# Patient Record
Sex: Female | Born: 1935 | Race: White | Hispanic: No | Marital: Married | Smoking: Never smoker
Health system: Southern US, Community
[De-identification: ages and names within clinical notes are randomized; demographics above are authoritative.]

## PROBLEM LIST (undated history)

## (undated) DIAGNOSIS — K219 Gastro-esophageal reflux disease without esophagitis: Secondary | ICD-10-CM

## (undated) DIAGNOSIS — M8580 Other specified disorders of bone density and structure, unspecified site: Secondary | ICD-10-CM

## (undated) DIAGNOSIS — C801 Malignant (primary) neoplasm, unspecified: Secondary | ICD-10-CM

## (undated) DIAGNOSIS — E785 Hyperlipidemia, unspecified: Secondary | ICD-10-CM

## (undated) DIAGNOSIS — R42 Dizziness and giddiness: Secondary | ICD-10-CM

## (undated) DIAGNOSIS — I10 Essential (primary) hypertension: Secondary | ICD-10-CM

## (undated) DIAGNOSIS — R0600 Dyspnea, unspecified: Secondary | ICD-10-CM

## (undated) DIAGNOSIS — E039 Hypothyroidism, unspecified: Secondary | ICD-10-CM

## (undated) DIAGNOSIS — M1990 Unspecified osteoarthritis, unspecified site: Secondary | ICD-10-CM

## (undated) DIAGNOSIS — E079 Disorder of thyroid, unspecified: Secondary | ICD-10-CM

## (undated) DIAGNOSIS — M4800 Spinal stenosis, site unspecified: Secondary | ICD-10-CM

## (undated) DIAGNOSIS — F419 Anxiety disorder, unspecified: Secondary | ICD-10-CM

## (undated) HISTORY — DX: Other specified disorders of bone density and structure, unspecified site: M85.80

## (undated) HISTORY — DX: Hyperlipidemia, unspecified: E78.5

## (undated) HISTORY — DX: Unspecified osteoarthritis, unspecified site: M19.90

## (undated) HISTORY — PX: EYE SURGERY: SHX253

## (undated) HISTORY — DX: Essential (primary) hypertension: I10

## (undated) HISTORY — DX: Spinal stenosis, site unspecified: M48.00

## (undated) HISTORY — PX: MASTECTOMY: SHX3

## (undated) HISTORY — DX: Anxiety disorder, unspecified: F41.9

## (undated) HISTORY — DX: Disorder of thyroid, unspecified: E07.9

---

## 1978-03-10 HISTORY — PX: BREAST SURGERY: SHX581

## 1987-07-10 HISTORY — PX: CHOLECYSTECTOMY: SHX55

## 1988-03-10 HISTORY — PX: ABDOMINAL HYSTERECTOMY: SHX81

## 1997-09-25 ENCOUNTER — Other Ambulatory Visit: Admission: RE | Admit: 1997-09-25 | Discharge: 1997-09-25 | Payer: Self-pay | Admitting: Gynecology

## 1998-02-06 LAB — HM COLONOSCOPY: HM Colonoscopy: NEGATIVE

## 1998-12-13 ENCOUNTER — Other Ambulatory Visit: Admission: RE | Admit: 1998-12-13 | Discharge: 1998-12-13 | Payer: Self-pay | Admitting: Gynecology

## 1999-12-18 ENCOUNTER — Other Ambulatory Visit: Admission: RE | Admit: 1999-12-18 | Discharge: 1999-12-18 | Payer: Self-pay | Admitting: Gynecology

## 2001-01-01 ENCOUNTER — Other Ambulatory Visit: Admission: RE | Admit: 2001-01-01 | Discharge: 2001-01-01 | Payer: Self-pay | Admitting: Gynecology

## 2002-02-07 DIAGNOSIS — M8580 Other specified disorders of bone density and structure, unspecified site: Secondary | ICD-10-CM

## 2002-02-07 HISTORY — DX: Other specified disorders of bone density and structure, unspecified site: M85.80

## 2002-02-09 ENCOUNTER — Other Ambulatory Visit: Admission: RE | Admit: 2002-02-09 | Discharge: 2002-02-09 | Payer: Self-pay | Admitting: Gynecology

## 2002-12-09 DIAGNOSIS — I10 Essential (primary) hypertension: Secondary | ICD-10-CM

## 2002-12-09 DIAGNOSIS — F419 Anxiety disorder, unspecified: Secondary | ICD-10-CM

## 2002-12-09 HISTORY — DX: Essential (primary) hypertension: I10

## 2002-12-09 HISTORY — DX: Anxiety disorder, unspecified: F41.9

## 2003-02-10 ENCOUNTER — Other Ambulatory Visit: Admission: RE | Admit: 2003-02-10 | Discharge: 2003-02-10 | Payer: Self-pay | Admitting: Gynecology

## 2003-11-23 LAB — FECAL OCCULT BLOOD, GUAIAC: Fecal Occult Blood: NEGATIVE

## 2004-02-26 ENCOUNTER — Other Ambulatory Visit: Admission: RE | Admit: 2004-02-26 | Discharge: 2004-02-26 | Payer: Self-pay | Admitting: Gynecology

## 2004-11-08 DIAGNOSIS — E785 Hyperlipidemia, unspecified: Secondary | ICD-10-CM

## 2004-11-08 HISTORY — DX: Hyperlipidemia, unspecified: E78.5

## 2004-11-19 ENCOUNTER — Ambulatory Visit: Payer: Self-pay | Admitting: Family Medicine

## 2004-11-26 ENCOUNTER — Ambulatory Visit: Payer: Self-pay | Admitting: Family Medicine

## 2004-12-30 ENCOUNTER — Ambulatory Visit: Payer: Self-pay | Admitting: Family Medicine

## 2005-05-13 ENCOUNTER — Other Ambulatory Visit: Admission: RE | Admit: 2005-05-13 | Discharge: 2005-05-13 | Payer: Self-pay | Admitting: Gynecology

## 2005-06-25 ENCOUNTER — Ambulatory Visit: Payer: Self-pay | Admitting: Family Medicine

## 2005-06-30 ENCOUNTER — Ambulatory Visit: Payer: Self-pay | Admitting: Family Medicine

## 2005-12-31 ENCOUNTER — Ambulatory Visit: Payer: Self-pay | Admitting: Family Medicine

## 2006-06-16 ENCOUNTER — Encounter: Payer: Self-pay | Admitting: Family Medicine

## 2006-06-16 DIAGNOSIS — M8580 Other specified disorders of bone density and structure, unspecified site: Secondary | ICD-10-CM | POA: Insufficient documentation

## 2006-06-16 DIAGNOSIS — F411 Generalized anxiety disorder: Secondary | ICD-10-CM | POA: Insufficient documentation

## 2006-06-16 DIAGNOSIS — E0520 Thyrotoxicosis with toxic multinodular goiter without thyrotoxic crisis or storm: Secondary | ICD-10-CM | POA: Insufficient documentation

## 2006-06-16 DIAGNOSIS — I10 Essential (primary) hypertension: Secondary | ICD-10-CM

## 2006-06-16 DIAGNOSIS — E785 Hyperlipidemia, unspecified: Secondary | ICD-10-CM | POA: Insufficient documentation

## 2006-06-22 DIAGNOSIS — M48061 Spinal stenosis, lumbar region without neurogenic claudication: Secondary | ICD-10-CM

## 2006-07-02 ENCOUNTER — Ambulatory Visit: Payer: Self-pay | Admitting: Family Medicine

## 2006-07-02 LAB — CONVERTED CEMR LAB
AST: 23 units/L (ref 0–37)
Alkaline Phosphatase: 80 units/L (ref 39–117)
CO2: 29 meq/L (ref 19–32)
Chloride: 105 meq/L (ref 96–112)
Creatinine,U: 89.2 mg/dL
GFR calc non Af Amer: 57 mL/min
Glucose, Bld: 99 mg/dL (ref 70–99)
HDL: 48.7 mg/dL (ref >39.0)
Hgb A1c MFr Bld: 6 % (ref 4.6–6.0)
LDL Cholesterol: 110 mg/dL — ABNORMAL HIGH (ref 0–99)
Microalb Creat Ratio: 2.2 mg/g (ref 0.0–30.0)
Microalb, Ur: 0.2 mg/dL (ref 0.0–1.9)
Sodium: 140 meq/L (ref 135–145)
TSH: 0.64 microintl units/mL (ref 0.35–5.50)
Total Bilirubin: 0.9 mg/dL (ref 0.3–1.2)
Triglycerides: 91 mg/dL (ref 0–149)
VLDL: 18 mg/dL (ref 0–40)

## 2006-07-06 ENCOUNTER — Ambulatory Visit: Payer: Self-pay | Admitting: Family Medicine

## 2006-10-20 ENCOUNTER — Other Ambulatory Visit: Admission: RE | Admit: 2006-10-20 | Discharge: 2006-10-20 | Payer: Self-pay | Admitting: Gynecology

## 2006-10-20 ENCOUNTER — Encounter: Payer: Self-pay | Admitting: Family Medicine

## 2007-06-28 ENCOUNTER — Ambulatory Visit: Payer: Self-pay | Admitting: Family Medicine

## 2007-06-29 ENCOUNTER — Telehealth: Payer: Self-pay | Admitting: Family Medicine

## 2007-06-29 LAB — CONVERTED CEMR LAB
CO2: 30 meq/L (ref 19–32)
Calcium: 9.5 mg/dL (ref 8.4–10.5)
Chloride: 109 meq/L (ref 96–112)
GFR calc non Af Amer: 64 mL/min
Glucose, Bld: 104 mg/dL — ABNORMAL HIGH (ref 70–99)
HDL: 44.9 mg/dL (ref >39.0)
Hgb A1c MFr Bld: 6.2 % — ABNORMAL HIGH (ref 4.6–6.0)
LDL Cholesterol: 114 mg/dL — ABNORMAL HIGH (ref 0–99)
Microalb Creat Ratio: 3.5 mg/g (ref 0.0–30.0)
Microalb, Ur: 0.3 mg/dL (ref 0.0–1.9)
Total CHOL/HDL Ratio: 4.1
Triglycerides: 133 mg/dL (ref 0–149)
VLDL: 27 mg/dL (ref 0–40)

## 2007-07-01 ENCOUNTER — Ambulatory Visit: Payer: Self-pay | Admitting: Family Medicine

## 2007-07-01 DIAGNOSIS — M79609 Pain in unspecified limb: Secondary | ICD-10-CM

## 2007-10-04 ENCOUNTER — Ambulatory Visit: Payer: Self-pay | Admitting: Family Medicine

## 2007-10-07 LAB — CONVERTED CEMR LAB
HDL: 39.8 mg/dL (ref >39.0)
Total CHOL/HDL Ratio: 4.1

## 2007-10-18 ENCOUNTER — Ambulatory Visit: Payer: Self-pay | Admitting: Family Medicine

## 2007-12-01 ENCOUNTER — Encounter: Payer: Self-pay | Admitting: Family Medicine

## 2007-12-08 ENCOUNTER — Encounter: Payer: Self-pay | Admitting: Family Medicine

## 2007-12-20 ENCOUNTER — Encounter: Payer: Self-pay | Admitting: Family Medicine

## 2007-12-28 ENCOUNTER — Ambulatory Visit: Payer: Self-pay | Admitting: Family Medicine

## 2007-12-29 DIAGNOSIS — C50919 Malignant neoplasm of unspecified site of unspecified female breast: Secondary | ICD-10-CM

## 2007-12-31 ENCOUNTER — Telehealth: Payer: Self-pay | Admitting: Family Medicine

## 2008-01-03 ENCOUNTER — Encounter: Admission: RE | Admit: 2008-01-03 | Discharge: 2008-01-03 | Payer: Self-pay | Admitting: General Surgery

## 2008-01-24 ENCOUNTER — Telehealth (INDEPENDENT_AMBULATORY_CARE_PROVIDER_SITE_OTHER): Payer: Self-pay | Admitting: *Deleted

## 2008-01-25 ENCOUNTER — Encounter: Admission: RE | Admit: 2008-01-25 | Discharge: 2008-01-25 | Payer: Self-pay | Admitting: General Surgery

## 2008-01-27 ENCOUNTER — Ambulatory Visit (HOSPITAL_BASED_OUTPATIENT_CLINIC_OR_DEPARTMENT_OTHER): Admission: RE | Admit: 2008-01-27 | Discharge: 2008-01-28 | Payer: Self-pay | Admitting: General Surgery

## 2008-01-27 ENCOUNTER — Encounter (HOSPITAL_BASED_OUTPATIENT_CLINIC_OR_DEPARTMENT_OTHER): Payer: Self-pay | Admitting: General Surgery

## 2008-02-11 ENCOUNTER — Encounter: Payer: Self-pay | Admitting: Family Medicine

## 2008-02-15 ENCOUNTER — Encounter: Payer: Self-pay | Admitting: Family Medicine

## 2008-02-17 ENCOUNTER — Ambulatory Visit: Payer: Self-pay | Admitting: Oncology

## 2008-03-06 ENCOUNTER — Encounter: Payer: Self-pay | Admitting: Family Medicine

## 2008-03-06 LAB — CMP (CANCER CENTER ONLY)
Albumin: 3.4 g/dL (ref 3.3–5.5)
Alkaline Phosphatase: 63 U/L (ref 26–84)
BUN, Bld: 15 mg/dL (ref 7–22)
Calcium: 9.7 mg/dL (ref 8.0–10.3)
Chloride: 105 mEq/L (ref 98–108)
Glucose, Bld: 112 mg/dL (ref 73–118)
Potassium: 3.9 mEq/L (ref 3.3–4.7)
Sodium: 142 mEq/L (ref 128–145)
Total Protein: 7 g/dL (ref 6.4–8.1)

## 2008-03-06 LAB — CBC WITH DIFFERENTIAL (CANCER CENTER ONLY)
BASO#: 0.1 10*3/uL (ref 0.0–0.2)
BASO%: 0.6 % (ref 0.0–2.0)
EOS%: 5 % (ref 0.0–7.0)
HCT: 35.8 % (ref 34.8–46.6)
HGB: 12 g/dL (ref 11.6–15.9)
LYMPH#: 1.8 10*3/uL (ref 0.9–3.3)
MCHC: 33.6 g/dL (ref 32.0–36.0)
MONO#: 0.7 10*3/uL (ref 0.1–0.9)
NEUT#: 4.7 10*3/uL (ref 1.5–6.5)
WBC: 7.6 10*3/uL (ref 3.9–10.0)

## 2008-03-15 ENCOUNTER — Encounter: Payer: Self-pay | Admitting: Family Medicine

## 2008-03-22 ENCOUNTER — Telehealth: Payer: Self-pay | Admitting: Family Medicine

## 2008-03-29 ENCOUNTER — Telehealth (INDEPENDENT_AMBULATORY_CARE_PROVIDER_SITE_OTHER): Payer: Self-pay | Admitting: *Deleted

## 2008-04-03 ENCOUNTER — Telehealth (INDEPENDENT_AMBULATORY_CARE_PROVIDER_SITE_OTHER): Payer: Self-pay | Admitting: *Deleted

## 2008-04-18 ENCOUNTER — Ambulatory Visit: Payer: Self-pay | Admitting: Oncology

## 2008-04-19 ENCOUNTER — Encounter: Payer: Self-pay | Admitting: Family Medicine

## 2008-05-16 ENCOUNTER — Encounter: Payer: Self-pay | Admitting: Family Medicine

## 2008-07-17 ENCOUNTER — Ambulatory Visit: Payer: Self-pay | Admitting: Oncology

## 2008-07-18 ENCOUNTER — Encounter: Payer: Self-pay | Admitting: Family Medicine

## 2008-07-18 LAB — CBC WITH DIFFERENTIAL (CANCER CENTER ONLY)
BASO#: 0.1 10*3/uL (ref 0.0–0.2)
BASO%: 0.9 % (ref 0.0–2.0)
Eosinophils Absolute: 0.3 10*3/uL (ref 0.0–0.5)
HCT: 38 % (ref 34.8–46.6)
HGB: 13 g/dL (ref 11.6–15.9)
LYMPH#: 1.9 10*3/uL (ref 0.9–3.3)
MONO#: 0.5 10*3/uL (ref 0.1–0.9)
NEUT%: 67.6 % (ref 39.6–80.0)
RBC: 4.26 10*6/uL (ref 3.70–5.32)
WBC: 8.6 10*3/uL (ref 3.9–10.0)

## 2008-07-18 LAB — CMP (CANCER CENTER ONLY)
AST: 25 U/L (ref 11–38)
Albumin: 3.9 g/dL (ref 3.3–5.5)
Alkaline Phosphatase: 77 U/L (ref 26–84)
BUN, Bld: 18 mg/dL (ref 7–22)
Creat: 1 mg/dl (ref 0.6–1.2)
Glucose, Bld: 148 mg/dL — ABNORMAL HIGH (ref 73–118)
Potassium: 3.9 mEq/L (ref 3.3–4.7)
Total Bilirubin: 0.6 mg/dl (ref 0.20–1.60)

## 2008-08-23 ENCOUNTER — Encounter: Payer: Self-pay | Admitting: Family Medicine

## 2008-08-23 LAB — URINALYSIS, MICROSCOPIC (CHCC SATELLITE)
Bacteria, UA: NEGATIVE
Bilirubin (Urine): NEGATIVE
Glucose: NEGATIVE g/dL
Leukocyte Esterase: NEGATIVE
Nitrite: NEGATIVE
WBC: NEGATIVE (ref 0–2)

## 2008-11-08 LAB — HM DIABETES EYE EXAM: HM Diabetic Eye Exam: NORMAL

## 2008-11-20 ENCOUNTER — Ambulatory Visit: Payer: Self-pay | Admitting: Oncology

## 2008-11-23 LAB — CBC WITH DIFFERENTIAL (CANCER CENTER ONLY)
BASO%: 0.9 % (ref 0.0–2.0)
LYMPH#: 2 10*3/uL (ref 0.9–3.3)
MONO#: 0.7 10*3/uL (ref 0.1–0.9)
Platelets: 324 10*3/uL (ref 145–400)
RBC: 4.06 10*6/uL (ref 3.70–5.32)
RDW: 12.5 % (ref 10.5–14.6)
WBC: 9.1 10*3/uL (ref 3.9–10.0)

## 2008-11-23 LAB — CMP (CANCER CENTER ONLY)
ALT(SGPT): 19 U/L (ref 10–47)
CO2: 31 mEq/L (ref 18–33)
Calcium: 9.7 mg/dL (ref 8.0–10.3)
Chloride: 100 mEq/L (ref 98–108)
Glucose, Bld: 107 mg/dL (ref 73–118)
Sodium: 141 mEq/L (ref 128–145)
Total Bilirubin: 0.7 mg/dl (ref 0.20–1.60)
Total Protein: 7.4 g/dL (ref 6.4–8.1)

## 2008-11-29 ENCOUNTER — Telehealth: Payer: Self-pay | Admitting: Family Medicine

## 2009-01-17 ENCOUNTER — Ambulatory Visit: Payer: Self-pay | Admitting: Family Medicine

## 2009-01-18 LAB — CONVERTED CEMR LAB
ALT: 18 units/L (ref 0–35)
Albumin: 3.7 g/dL (ref 3.5–5.2)
Basophils Absolute: 0.1 10*3/uL (ref 0.0–0.1)
CO2: 29 meq/L (ref 19–32)
Calcium: 9.6 mg/dL (ref 8.4–10.5)
Chloride: 102 meq/L (ref 96–112)
Creatinine, Ser: 1 mg/dL (ref 0.4–1.2)
Eosinophils Absolute: 0.3 10*3/uL (ref 0.0–0.7)
GFR calc non Af Amer: 56.68 mL/min (ref >60)
Glucose, Bld: 103 mg/dL — ABNORMAL HIGH (ref 70–99)
HCT: 35.7 % — ABNORMAL LOW (ref 36.0–46.0)
Hemoglobin: 12.5 g/dL (ref 12.0–15.0)
Hgb A1c MFr Bld: 6.1 % (ref 4.6–6.5)
Lymphocytes Relative: 22.5 % (ref 12.0–46.0)
MCHC: 35.1 g/dL (ref 30.0–36.0)
MCV: 94.1 fL (ref 78.0–100.0)
Neutro Abs: 5.2 10*3/uL (ref 1.4–7.7)
Phosphorus: 4.4 mg/dL (ref 2.3–4.6)
Triglycerides: 145 mg/dL (ref 0.0–149.0)
VLDL: 29 mg/dL (ref 0.0–40.0)

## 2009-02-14 ENCOUNTER — Ambulatory Visit: Payer: Self-pay | Admitting: Oncology

## 2009-02-19 ENCOUNTER — Encounter: Payer: Self-pay | Admitting: Family Medicine

## 2009-02-19 LAB — CMP (CANCER CENTER ONLY)
AST: 23 U/L (ref 11–38)
Albumin: 3.3 g/dL (ref 3.3–5.5)
Alkaline Phosphatase: 61 U/L (ref 26–84)
BUN, Bld: 17 mg/dL (ref 7–22)
Calcium: 8.8 mg/dL (ref 8.0–10.3)
Chloride: 101 mEq/L (ref 98–108)
Potassium: 3.9 mEq/L (ref 3.3–4.7)
Sodium: 138 mEq/L (ref 128–145)
Total Protein: 6.4 g/dL (ref 6.4–8.1)

## 2009-02-19 LAB — CBC WITH DIFFERENTIAL (CANCER CENTER ONLY)
BASO%: 0.5 % (ref 0.0–2.0)
LYMPH%: 20.3 % (ref 14.0–48.0)
MCH: 31 pg (ref 26.0–34.0)
MCV: 94 fL (ref 81–101)
MONO#: 0.4 10*3/uL (ref 0.1–0.9)
MONO%: 5.5 % (ref 0.0–13.0)
NEUT#: 5.5 10*3/uL (ref 1.5–6.5)
Platelets: 264 10*3/uL (ref 145–400)
RDW: 12.1 % (ref 10.5–14.6)
WBC: 7.9 10*3/uL (ref 3.9–10.0)

## 2009-02-20 LAB — VITAMIN D 25 HYDROXY (VIT D DEFICIENCY, FRACTURES): Vit D, 25-Hydroxy: 23 ng/mL — ABNORMAL LOW (ref 30–89)

## 2009-03-23 ENCOUNTER — Telehealth: Payer: Self-pay | Admitting: Family Medicine

## 2009-04-02 ENCOUNTER — Telehealth: Payer: Self-pay | Admitting: Family Medicine

## 2009-05-16 ENCOUNTER — Telehealth: Payer: Self-pay | Admitting: Family Medicine

## 2009-05-18 ENCOUNTER — Ambulatory Visit: Payer: Self-pay | Admitting: Family Medicine

## 2009-06-08 LAB — HM MAMMOGRAPHY: HM Mammogram: NEGATIVE

## 2009-07-17 ENCOUNTER — Ambulatory Visit: Payer: Self-pay | Admitting: Family Medicine

## 2009-07-17 LAB — CONVERTED CEMR LAB
Albumin: 3.9 g/dL (ref 3.5–5.2)
BUN: 21 mg/dL (ref 6–23)
CO2: 33 meq/L — ABNORMAL HIGH (ref 19–32)
Creatinine, Ser: 1 mg/dL (ref 0.4–1.2)
Creatinine,U: 141.2 mg/dL
GFR calc non Af Amer: 60.07 mL/min (ref >60)
Glucose, Bld: 102 mg/dL — ABNORMAL HIGH (ref 70–99)
HDL: 59.4 mg/dL (ref >39.00)
Hgb A1c MFr Bld: 5.9 % (ref 4.6–6.5)
LDL Cholesterol: 112 mg/dL — ABNORMAL HIGH (ref 0–99)
Microalb Creat Ratio: 1.4 mg/g (ref 0.0–30.0)
Sodium: 146 meq/L — ABNORMAL HIGH (ref 135–145)
TSH: 1.16 microintl units/mL (ref 0.35–5.50)
Triglycerides: 112 mg/dL (ref 0.0–149.0)

## 2009-07-19 ENCOUNTER — Ambulatory Visit: Payer: Self-pay | Admitting: Family Medicine

## 2009-07-22 DIAGNOSIS — R7309 Other abnormal glucose: Secondary | ICD-10-CM

## 2009-08-01 ENCOUNTER — Inpatient Hospital Stay: Payer: Self-pay | Admitting: Internal Medicine

## 2009-08-01 ENCOUNTER — Ambulatory Visit: Payer: Self-pay | Admitting: Cardiovascular Disease

## 2009-08-02 ENCOUNTER — Encounter: Payer: Self-pay | Admitting: Cardiovascular Disease

## 2009-08-02 ENCOUNTER — Encounter: Payer: Self-pay | Admitting: Family Medicine

## 2009-08-08 ENCOUNTER — Ambulatory Visit: Payer: Self-pay | Admitting: Family Medicine

## 2009-08-13 ENCOUNTER — Ambulatory Visit: Payer: Self-pay | Admitting: Oncology

## 2009-08-15 ENCOUNTER — Encounter: Payer: Self-pay | Admitting: Family Medicine

## 2009-08-15 LAB — CMP (CANCER CENTER ONLY)
Albumin: 4.2 g/dL (ref 3.3–5.5)
Alkaline Phosphatase: 71 U/L (ref 26–84)
CO2: 29 mEq/L (ref 18–33)
Calcium: 10.2 mg/dL (ref 8.0–10.3)
Chloride: 100 mEq/L (ref 98–108)
Creat: 0.8 mg/dl (ref 0.6–1.2)
Potassium: 4 mEq/L (ref 3.3–4.7)
Sodium: 142 mEq/L (ref 128–145)
Total Protein: 7.5 g/dL (ref 6.4–8.1)

## 2009-08-15 LAB — CBC WITH DIFFERENTIAL (CANCER CENTER ONLY)
BASO#: 0.1 10*3/uL (ref 0.0–0.2)
HGB: 10.1 g/dL — ABNORMAL LOW (ref 11.6–15.9)
LYMPH#: 1.7 10*3/uL (ref 0.9–3.3)
MCH: 30.6 pg (ref 26.0–34.0)
NEUT#: 6.8 10*3/uL — ABNORMAL HIGH (ref 1.5–6.5)
RBC: 3.29 10*6/uL — ABNORMAL LOW (ref 3.70–5.32)
WBC: 9.6 10*3/uL (ref 3.9–10.0)

## 2009-08-15 LAB — FERRITIN: Ferritin: 135 ng/mL (ref 10–291)

## 2009-08-15 LAB — CANCER ANTIGEN 27.29: CA 27.29: 25 U/mL (ref 0–39)

## 2009-08-15 LAB — IRON AND TIBC: UIBC: 214 ug/dL

## 2009-10-09 ENCOUNTER — Ambulatory Visit: Payer: Self-pay | Admitting: Family Medicine

## 2009-10-09 DIAGNOSIS — R4589 Other symptoms and signs involving emotional state: Secondary | ICD-10-CM

## 2009-10-23 ENCOUNTER — Ambulatory Visit: Payer: Self-pay | Admitting: Family Medicine

## 2009-11-08 ENCOUNTER — Ambulatory Visit: Payer: Self-pay | Admitting: Oncology

## 2009-11-15 ENCOUNTER — Encounter: Payer: Self-pay | Admitting: Family Medicine

## 2009-11-15 LAB — CBC WITH DIFFERENTIAL (CANCER CENTER ONLY)
BASO#: 0.1 10*3/uL (ref 0.0–0.2)
BASO%: 0.7 % (ref 0.0–2.0)
EOS%: 2.4 % (ref 0.0–7.0)
HGB: 12.2 g/dL (ref 11.6–15.9)
LYMPH#: 1.7 10*3/uL (ref 0.9–3.3)
LYMPH%: 16.8 % (ref 14.0–48.0)
MCH: 31.4 pg (ref 26.0–34.0)
MCV: 91 fL (ref 81–101)
MONO%: 8.5 % (ref 0.0–13.0)
NEUT#: 7.1 10*3/uL — ABNORMAL HIGH (ref 1.5–6.5)
RBC: 3.88 10*6/uL (ref 3.70–5.32)
WBC: 9.8 10*3/uL (ref 3.9–10.0)

## 2009-11-15 LAB — CMP (CANCER CENTER ONLY)
ALT(SGPT): 22 U/L (ref 10–47)
AST: 24 U/L (ref 11–38)
Alkaline Phosphatase: 84 U/L (ref 26–84)
CO2: 28 mEq/L (ref 18–33)
Total Protein: 6.9 g/dL (ref 6.4–8.1)

## 2010-01-11 ENCOUNTER — Ambulatory Visit: Payer: Self-pay | Admitting: Internal Medicine

## 2010-02-07 ENCOUNTER — Ambulatory Visit: Payer: Self-pay | Admitting: Internal Medicine

## 2010-03-31 ENCOUNTER — Encounter: Payer: Self-pay | Admitting: Oncology

## 2010-04-09 NOTE — Letter (Signed)
[facility]   Imported By: Lester Cave 08/27/2009 12:28:00  _____________________________________________________________________  External Attachment:    Type:   Image     Comment:   External Document

## 2010-04-09 NOTE — Assessment & Plan Note (Signed)
Summary: roa 6 mths- f/u on labs cyd   Vital Signs:  Patient profile:   77 year old female Height:      61.4 inches Weight:      151.50 pounds BMI:     28.36 Temp:     97.8 degrees F oral Pulse rate:   64 / minute Pulse rhythm:   regular BP sitting:   168 / 76  (left arm) Cuff size:   regular  Vitals Entered By: Lewanda Rife LPN (2009/08/12 9:07 AM) CC: six month f/u after labs   History of Present Illness: here for f/u of lipid/DM and HTN  is feeling fair   lost a son april 11 th - is down  is not getting any counseling -- "cannot go for that "  husb is getting alz  she is not feeling suicidal  no support or people to talk to   gets up at night to urinate frequently   wt is stable   bp is 168/76 today- high for her   sodium 146 water intake  AIC great at 5.9  opthy up to date  microalb is up  cough with ace in past   lipids great with LDL down to 112 and HDL 59 -- this is imp with diet  is doing well with low sat fat diet and low sugar as well      Allergies: 1)  ! Zestril (Lisinopril) 2)  ! Ery-Tab (Erythromycin Base)  Past History:  Past Medical History: Last updated: 07/01/2007 Anxiety: 12/2002 Diabetes mellitus, type II: 11/2004 Hyperlipidemia: 11/2004 Hypertension: 12/2002 spinal stenosis  hypothyroid after radio ablation of thyroid   endo---Dr [facility]  Past Surgical History: Last updated: 07/06/2006  01/1998: COLONOSCOPY (HEMM'S) NEG. POLYOS ([place]) 02/2002: DEXA IMPROVED  OSTEOPENIA 02/26/2004: DEXA (DR.LOMAX) STABLE/ IMR FEMUR/SPINE 07/10/1987 CHOLEYCYSTECTOMY 1990 HYSTERECTOMY  ENDOMETRIAL ADENOCA 1980 L BREAST LUMPECTOMY 1980'S ABSCESS OF BREAST EVAC TEAR DUCT SURGERY  Family History: Last updated: 2009-08-12 Father: DECEASED 45 YOA; CA COLON Mother: DIED 34 YOA?CROHNS ALZHEIMERS /ANGINA/HTN Siblings: BROTHERS 3/1BROTHER ETOH,1 BROTHERSLEEP APNEA SISTERS:2 / 1 dec 68  BREAST CA/ ANGINA/EMPHYSEMA (SMOKER) O2 TX. 1 SISTER  HTN BACK SURG. 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 1 SISTER HTN BACK SURG. TOTAL KNEE/ SMOKER CV: =SISTER(ANGINA) MOTHER(ANGINA) HBP+ MOTHER,SISTER DM: = P AUNT COLON CANCER:+ FATHER DEPRESSION: IN FAMILY ETOH/DRUG ABUSE: +BRO OTHER: NEGATIVE STROKE Family History of Colon CA 1st degree relative <60  lost son to suicide in Aug 12, 2009   Review of Systems General:  Complains of fatigue; denies fever, loss of appetite, and malaise. Eyes:  Denies blurring and eye irritation. CV:  Denies chest pain or discomfort, lightheadness, palpitations, shortness of breath with exertion, and swelling of feet. Resp:  Denies cough, shortness of breath, and wheezing. GI:  Denies abdominal  pain, bloody stools, change in bowel habits, indigestion, and nausea. GU:  Denies urinary frequency. MS:  Complains of stiffness; denies muscle aches and cramps. Derm:  Denies itching, lesion(s), poor wound healing, and rash. Neuro:  Denies numbness, tingling, and tremors. Psych:  Complains of depression and easily tearful; denies panic attacks, sense of great danger, and suicidal thoughts/plans. Endo:  Denies cold  intolerance, excessive thirst, excessive urination, and heat intolerance. Heme:  Denies abnormal bruising and bleeding.  Physical Exam  General:  overweight but generally well appearing  Head:  normocephalic, atraumatic, and no abnormalities observed.  no facial or head trauma  Eyes:  vision grossly intact, pupils equal, pupils round, and pupils reactive to light.   Neck:  supple with full rom and no masses or thyromegally, no JVD or carotid bruit  Lungs:  Normal respiratory effort, chest expands symmetrically. Lungs are clear to auscultation, no crackles or wheezes. Heart:  Normal rate and regular rhythm. S1 and S2 normal without gallop, murmur, click, rub or other extra sounds. Abdomen:  Bowel sounds positive,abdomen soft and non-tender without masses, organomegaly or hernias noted. no renal bruits  Pulses:  R and L carotid,radial,femoral,dorsalis pedis and posterior tibial pulses are full and equal bilaterally Extremities:  No clubbing, cyanosis, edema, or deformity noted with normal full range of motion of all joints.   Neurologic:  sensation intact to light touch, gait normal, and DTRs symmetrical and normal.   Skin:  Intact without suspicious lesions or rashes Cervical Nodes:  No lymphadenopathy noted Psych:  is sad today but not tearful nl eye contact and comm skills  Diabetes Management Exam:    Foot Exam (with socks and/or shoes not present):       Sensory-Pinprick/Light touch:          Left medial foot (L-4): normal          Left dorsal foot (L-5): normal          Left lateral foot (S-1): normal          Right medial foot (L-4): normal          Right dorsal foot (L-5): normal          Right lateral foot (S-1): normal       Sensory-Monofilament:          Left foot: normal          Right foot: normal       Inspection:          Left foot: normal          Right foot: normal       Nails:          Left foot: normal          Right foot: normal   Impression &  Recommendations:  Problem # 1:  HYPERTENSION (ICD-401.9) Assessment Deteriorated  bp is up  add cozaar for HTN and renal protection (had cough on ace)  ? if also grief and stress related  f/u 1-2 mo  Her updated medication list for this problem includes:    Atenolol 25 Mg Tabs (Atenolol) .Marland Kitchen... 1/2 tablet by mouth once a day    Cozaar 50 Mg Tabs (Losartan potassium) .Marland Kitchen... 1 by mouth once daily  BP today: 168/76 Prior BP: 132/80 (05/18/2009)  Labs Reviewed: K+: 4.0 (07/17/2009) Creat: : 1.0 (07/17/2009)   Chol: 194 (07/17/2009)   HDL: 59.40 (07/17/2009)   LDL: 112 (07/17/2009)   TG: 112.0 (07/17/2009)  Orders: Prescription Created  Electronically [phone])  Problem # 2:  HYPERLIPIDEMIA (ICD-272.4) Assessment: Unchanged this is imp with better diet - urged to keep it up goal is LDL under 100  rev low sat fat diet and goals for this  f/u 1-2 mo  Labs Reviewed: SGOT: 21 (07/17/2009)   SGPT: 21 (07/17/2009)   HDL:59.40 (07/17/2009), 41.10 (01/17/2009)  LDL:112 (07/17/2009), 127 (01/17/2009)  Chol:194 (07/17/2009), 197 (01/17/2009)  Trig:112.0 (07/17/2009), 145.0 (01/17/2009)  Problem # 3:  HYPERGLYCEMIA (ICD-790.29) Assessment: Improved this is imp with AIc now under 6  will continue to work on diet and exercise for control  utd on opthy and good foot care   Problem # 4:  GRIEF REACTION (ICD-309.0) Assessment: New with loss of son to suicide pt does not think she is depressed getting by ok  disc symptoms - also stress at home she herself has no SI rev tx opt- will let me know in future if open to counseling  Complete Medication List: 1)  Atenolol 25 Mg Tabs (Atenolol) .... 1/2 tablet by mouth once a day 2)  Levoxyl 75 Mcg Tabs (Levothyroxine sodium) .... Take 1 tablet by mouth once a day 3)  Citracal + D 315-200 Mg-unit Tabs (Calcium citrate-vitamin d) .Marland Kitchen.. 1 twice a day 4)  Arimidex 1 Mg Tabs (Anastrozole) .... Take one tab once daily 5)  B Complex-b12 Tabs (B complex  vitamins) .... Once daily 6)  Vitamin D 1000 Unit Tabs (Cholecalciferol) .... Take once daily 7)  Ascorbic Acid 1000 Mg Tabs (Ascorbic acid) .... Take one tab once daily 8)  Aleve 220 Mg Tabs (Naproxen sodium) .Marland Kitchen.. 1 by mouth up to two times a day as needed pain with food 9)  Aspirin 81 Mg Tbec (Aspirin) .Marland Kitchen.. 1 by mouth once daily with food 10)  Cozaar 50 Mg Tabs (Losartan potassium) .Marland Kitchen.. 1 by mouth once daily  Patient Instructions: 1)  start cozaar 50 mg daily --- for kidney protection and blood pressure 2)  if any problems or side effects let me know  3)  make sure to drink more water  4)  follow up in 1-2 months  5)  let me know if you are interested in counseling for stress in the future 6)  I sent your medicines to your pharmacy  Prescriptions: ATENOLOL 25 MG TABS (ATENOLOL) 1/2 tablet by mouth once a day  #45 x 3   Entered and Authorized by:   Judith Part MD   Signed by:   Judith Part MD on 07/19/2009   Method used:   Electronically to        [facility] [address]* (retail)       [address]       [place], [place]  [address]       Ph: [phone]       Fax: [phone]   RxID:   [id number] COZAAR 50 MG TABS (LOSARTAN POTASSIUM) 1 by mouth once daily  #90 x 3   Entered and Authorized by:   Judith Part MD   Signed by:   Judith Part MD on 07/19/2009   Method used:   Electronically to        [facility] [address] (retail)       [address]       [place], [place]  [address]       Ph: [phone]       Fax: [phone]   RxID:   [id number]   Current  Allergies (reviewed today): ! ZESTRIL (LISINOPRIL) ! ERY-TAB (ERYTHROMYCIN BASE)

## 2010-04-09 NOTE — Assessment & Plan Note (Signed)
Summary: BP CHECK / LFW  Nurse Visit   Vital Signs:  Patient profile:   77 year old female Height:      61.4 inches Weight:      157.50 pounds BMI:     29.48 Temp:     98.3 degrees F oral Pulse rate:   80 / minute Pulse rhythm:   regular BP sitting:   140 / 66  (left arm) Cuff size:   regular  Vitals Entered By: Lewanda Rife LPN (October 23, 2009 11:24 AM) CC: Pt present today for BP check at the request of the physician. Comments Relevant hx: Pt present today for BP check. Pt has been compliant with taking meds and no side effects noted. No ther complaints noted. Pt can be reached at [phone]. This is pt's only # (home #). Pt is going to be in and out of her home today and if I have not spoken with her by 4Pm pt will call me back. Pt uses [facility] [address] if needed.Lewanda Rife LPN  October 23, 2009 11:27 AM    Allergies: 1)  ! Zestril (Lisinopril) 2)  ! Ery-Tab (Erythromycin Base) 3)  ! Atenolol  Orders Added: 1)  Est. Patient Level I [45409]  Appended Document: BP CHECK / LFW please let pt know bp is better on both meds f/u with me in about 6 months   Appended Document: BP CHECK / LFW Patient notified as instructed by telephone. Appt withDR Tower scheduled 04/30/09 at 10:15am

## 2010-04-09 NOTE — Letter (Signed)
Summary: [facility]  [facility]   Imported By: Sherian Rein 11/24/2009 09:56:56  _____________________________________________________________________  External Attachment:    Type:   Image     Comment:   External Document

## 2010-04-09 NOTE — Progress Notes (Signed)
Summary: regarding lisinopril? hctz  Phone Note Refill Request Message from:  Fax from Pharmacy  Refills Requested: Medication #1:  lisinopril/ hctz  20-25 Faxed refill request from [facility]- this is no longer on pt's med list.  Please advise.  Initial call taken by: Lowella Petties CMA,  March 23, 2009 5:26 PM  Follow-up for Phone Call        this was removed from her list because she had a cough from it  if cough is better - can refil for 6 mo and put back on her list Follow-up by: Judith Part MD,  March 23, 2009 5:30 PM  Additional Follow-up for Phone Call Additional follow up Details #1::        Advised pt, she is still coughing and will stop the lisinopril.  she has appt with me in may please ask her to check her bp at home or come in for nurse check off of med -- thanks  Additional Follow-up by: Lowella Petties CMA,  March 23, 2009 5:35 PM    Additional Follow-up for Phone Call Additional follow up Details #2::    Advised pt. Follow-up by: Lowella Petties CMA,  March 26, 2009 8:59 AM

## 2010-04-09 NOTE — Letter (Signed)
[facility]   Imported By: Harlon Flor 08/07/2009 14:10:06  _____________________________________________________________________  External Attachment:    Type:   Image     Comment:   External Document

## 2010-04-09 NOTE — Progress Notes (Signed)
Summary: levoxyl  Phone Note Refill Request Message from:  Fax from Pharmacy on April 02, 2009 2:22 PM  Refills Requested: Medication #1:  LEVOXYL 75 MCG TABS Take 1 tablet by mouth once a day   Supply Requested: 3 months   Last Refilled: 01/01/2009 [facility] [address] [phone]   Method Requested: Electronic Initial call taken by: Benny Lennert CMA Duncan Dull),  April 02, 2009 2:23 PM  Follow-up for Phone Call        px written on EMR for call in  Follow-up by: Judith Part MD,  April 02, 2009 2:33 PM    Prescriptions: LEVOXYL 75 MCG TABS (LEVOTHYROXINE SODIUM) Take 1 tablet by mouth once a day  #90 x 2   Entered by:   Lowella Petties CMA   Authorized by:   Judith Part MD   Signed by:   Lowella Petties CMA on 04/02/2009   Method used:   Electronically to        [facility] [address] (retail)       [address]       [place], [place]  [address]       Ph: [phone]       Fax: [phone]   RxID:   [id number]

## 2010-04-09 NOTE — Letter (Signed)
Summary: [facility]- discharge instructions  [facility]   Imported By: Beau Fanny 08/03/2009 08:29:30  _____________________________________________________________________  External Attachment:    Type:   Image     Comment:   External Document

## 2010-04-09 NOTE — Assessment & Plan Note (Signed)
Summary: F[facility]U [facility]  D/C 08/02/09/[facility]   Vital Signs:  Patient profile:   77 year old female Height:      61.4 inches Weight:      151.25 pounds BMI:     28.31 Temp:     99.2 degrees F oral Pulse rate:   64 / minute Pulse rhythm:   regular BP sitting:   138 / 66  (left arm) Cuff size:   regular  Vitals Entered By: Lewanda Rife LPN (August 08, 1608 12:10 PM) CC: f/u from discharge from [facility]   History of Present Illness: pt was in hosp [facility] from 5/25-5/26 for dizziness and falls  thought to be most likely positional vertigo - given as needed meclizine and ref to ENT  also d/c her atenolol - for mild sinus brady (had briefly in hosp)  was also put on losartan  changed to ca channel blocker - norvasc  head CT nl  MRI brain - age rel atrophic changes  carotid doppler - no signif stenosis  2D echo - nl EF 55%(Dr Gollan)   entered hosp with systolic bp over 960 that quickly improved   was seen by OT and PT for the falls   nl sugar and thyroid in hosp   ENT Dr Andee Poles -- saw him once in follow up  hearing was really good  wondering if she has inner ear problem-- will be going back for 2 hour test next week (? ENG)  was going to proceed with PT for balance after her ENT work up   still feeling funny  head bothers her but not really dizzy -- is quite mild has not needed meclizine since she went home  balance is not good- she relies on cane -- getting used to 4 prong cane     Allergies: 1)  ! Zestril (Lisinopril) 2)  ! Ery-Tab (Erythromycin Base) 3)  ! Atenolol  Past History:  Past Medical History: Last updated: 07/01/2007 Anxiety: 12/2002 Diabetes mellitus, type II: 11/2004 Hyperlipidemia: 11/2004 Hypertension: 12/2002 spinal stenosis  hypothyroid after radio ablation of thyroid   endo---Dr [facility]  Past Surgical History: Last updated: 07/06/2006  01/1998: COLONOSCOPY (HEMM'S) NEG. POLYOS ([place]) 02/2002: DEXA IMPROVED  OSTEOPENIA 02/26/2004: DEXA (DR.LOMAX)  STABLE/ IMR FEMUR/SPINE 07/10/1987 CHOLEYCYSTECTOMY 1990 HYSTERECTOMY  ENDOMETRIAL ADENOCA 1980 L BREAST LUMPECTOMY 1980'S ABSCESS OF BREAST EVAC TEAR DUCT SURGERY  Family History: Last updated: 08-12-2009 Father: DECEASED 68 YOA; CA COLON Mother: DIED 49 YOA?CROHNS ALZHEIMERS /ANGINA/HTN Siblings: BROTHERS 3/1BROTHER ETOH,1 BROTHERSLEEP APNEA SISTERS:2 / 1 dec 68  BREAST CA/ ANGINA/EMPHYSEMA (SMOKER) O2 TX. 1 SISTER HTN BACK SURG. 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 Eyes:  Denies blurring and eye pain. ENT:  Denies nasal congestion and sinus pressure. CV:  Denies chest pain or discomfort, palpitations, shortness of breath with exertion, and swelling of feet. Resp:  Denies cough, shortness of breath, and wheezing. GI:  Denies abdominal pain, change in bowel habits, indigestion, nausea, and vomiting. GU:  Denies dysuria, hematuria, and urinary frequency. MS:  Complains of stiffness; denies muscle aches. Derm:  Denies itching, lesion(s), poor wound healing, and rash. Neuro:  Complains of sensation of room spinning; denies headaches, memory loss, numbness, tingling, and weakness. Psych:  mood is fair- handling grief best she can. Endo:  Denies cold intolerance, excessive thirst, excessive urination, and  heat intolerance. Heme:  Denies abnormal bruising and bleeding.  Physical Exam  General:  overweight but generally well appearing  Head:  normocephalic, atraumatic, and no abnormalities observed. laceration noted and healing no bony tenderness Eyes:  vision grossly intact, pupils equal, pupils round, corneas and lenses clear, no injection, and no iris abnormalities.  1-2 beats of horiz nystagmus today Ears:  R ear normal and L ear normal.   Nose:  nares boggy but clear Mouth:  pharynx pink and moist, no erythema, and no exudates.   Neck:  supple with full rom and no masses or thyromegally, no JVD or carotid bruit  Chest Wall:  No deformities, masses, or tenderness noted. Lungs:  Normal respiratory effort, chest expands symmetrically. Lungs are clear to auscultation, no crackles or wheezes. Heart:  Normal rate and regular rhythm. S1 and S2 normal without gallop, murmur, click, rub or other extra sounds. Abdomen:  Bowel sounds positive,abdomen soft and non-tender without masses, organomegaly or hernias noted. no renal bruits  Msk:  No deformity or scoliosis noted of thoracic or lumbar spine.  no acute joint changes  Pulses:  R and L carotid,radial,femoral,dorsalis pedis and posterior tibial pulses are full and equal bilaterally Extremities:  No clubbing, cyanosis, edema, or deformity noted with normal full range of motion of all joints.   Neurologic:  alert & oriented X3, cranial nerves II-XII intact, strength normal in all extremities, sensation intact to light touch, gait normal, DTRs symmetrical and normal, and toes down bilaterally on Babinski.   Skin:  laceration over R eyebrow - is healing well with glue no redness or drainage Cervical Nodes:  No lymphadenopathy noted Inguinal Nodes:  No significant adenopathy Psych:  nl affect/ not seemingly anxious mentally sharp   Impression & Recommendations:  Problem # 1:  VERTIGO (ICD-780.4) Assessment New rev hosp records in detail- and  overall symptoms are much better rev nl CT and MRI of brain/ carotid duplex and 2D echo- all re- assuring  will f/u with ENT soon for some inner ear testing (? ENG)  has meclizine on hand  plans to do PT for balance when work up is complete-- I agree this is good idea non focal exam today - walks well with a cane  Her updated medication list for this problem includes:    Meclizine Hcl 25 Mg Tabs (Meclizine hcl) .Marland Kitchen... Take one tab up to three times a day as needed for vertigo  Problem # 2:  HYPERTENSION (ICD-401.9) Assessment: Deteriorated  some meds were changed - pt unclear about them very high bp in hosp- is now resolved  atenolol stopped for mild bradycardia and repl with amlodipine should still be on cozaar bp fair today pt will go home and check bottles and call us to confirm her list (she is mentally sharp)  The following medications were removed from the medication list:    Atenolol 25 Mg Tabs (Atenolol) .Marland Kitchen... 1/2 tablet by mouth once a day Her updated medication list for this problem includes:    Cozaar 50 Mg Tabs (Losartan potassium) .Marland KitchenMarland KitchenMarland KitchenMarland Kitchen 1  by mouth once daily    Amlodipine Besylate 5 Mg Tabs (Amlodipine besylate) .Marland Kitchen... Take 1 tablet by mouth once a day  BP today: 138/66 Prior BP: 168/76 (07/19/2009)  Labs Reviewed: K+: 4.0 (07/17/2009) Creat: : 1.0 (07/17/2009)   Chol: 194 (07/17/2009)   HDL: 59.40 (07/17/2009)   LDL: 112 (07/17/2009)   TG: 112.0 (07/17/2009)  Problem # 3:  THYROID DISEASE, HX OF (ICD-V12.2) Assessment: Comment Only tsh stable in hosp - feels ok   Problem # 4:  LACERATION, FOREHEAD (JWJ-191.47) Assessment: New from fall - is healing well over R eye brow -- no s/s of infection will watch for redness or swelling or pain  Complete Medication List: 1)  Levoxyl 75 Mcg Tabs (Levothyroxine sodium) .... Take 1 tablet by mouth once a day 2)  Citracal + D 315-200 Mg-unit Tabs (Calcium citrate-vitamin d) .Marland Kitchen.. 1 twice a day 3)  Arimidex 1 Mg Tabs  (Anastrozole) .... Take one tab once daily 4)  B Complex-b12 Tabs (B complex vitamins) .... Once daily 5)  Vitamin D 1000 Unit Tabs (Cholecalciferol) .... Take once daily 6)  Ascorbic Acid 1000 Mg Tabs (Ascorbic acid) .... Take one tab once daily 7)  Aleve 220 Mg Tabs (Naproxen sodium) .Marland Kitchen.. 1 by mouth up to two times a day as needed pain with food 8)  Aspirin 81 Mg Tbec (Aspirin) .Marland Kitchen.. 1 by mouth once daily with food 9)  Cozaar 50 Mg Tabs (Losartan potassium) .Marland Kitchen.. 1 by mouth once daily 10)  Meclizine Hcl 25 Mg Tabs (Meclizine hcl) .... Take one tab up to three times a day as needed for vertigo 11)  Amlodipine Besylate 5 Mg Tabs (Amlodipine besylate) .... Take 1 tablet by mouth once a day  Patient Instructions: 1)  this is list of medicines I think you should be on now  2)  go home and review your medicines- if they do not match up with the list please let me know  3)  also let me know if your symptoms worsen 4)  your wound is healing well  5)  follow up with ENT as planned  6)  follow up with me in 2 months for blood pressure check  7)  can cancel the july 11 th visit   Current Allergies (reviewed today): ! ZESTRIL (LISINOPRIL) ! ERY-TAB (ERYTHROMYCIN BASE) ! ATENOLOL

## 2010-04-09 NOTE — Assessment & Plan Note (Signed)
Summary: 2 MONTH FOLLOW UP/RBH   Vital Signs:  Patient profile:   77 year old female Height:      61.4 inches Weight:      156.75 pounds BMI:     29.34 Temp:     98.1 degrees F oral Pulse rate:   76 / minute Pulse rhythm:   regular BP sitting:   152 / 78  (left arm) Cuff size:   regular  Vitals Entered By: Lewanda Rife LPN (October 09, 2009 12:14 PM) CC: two month follow up   History of Present Illness: here for f/u of HTN   wt is up 5 lb with bmi of 29   HTN with bp 152/78 today  is on amlodipine and cozaar currently-- actually is not on the cozaar - she ran out of it   is having trouble with fatigue - not sleepig well at night  will be going back to work soon    dizziness --went through work up with ENT was px therapy -- has not had time to do it  thinks she had ENG  Allergies: 1)  ! Zestril (Lisinopril) 2)  ! Ery-Tab (Erythromycin Base) 3)  ! Atenolol  Past History:  Past Medical History: Last updated: 07/01/2007 Anxiety: 12/2002 Diabetes mellitus, type II: 11/2004 Hyperlipidemia: 11/2004 Hypertension: 12/2002 spinal stenosis  hypothyroid after radio ablation of thyroid   endo---Dr [facility]  Past Surgical History: Last updated: 07/06/2006  01/1998: COLONOSCOPY (HEMM'S) NEG. POLYOS ([place]) 02/2002: DEXA IMPROVED  OSTEOPENIA 02/26/2004: DEXA (DR.LOMAX) STABLE/ IMR FEMUR/SPINE 07/10/1987 CHOLEYCYSTECTOMY 1990 HYSTERECTOMY  ENDOMETRIAL ADENOCA 1980 L BREAST LUMPECTOMY 1980'S ABSCESS OF BREAST EVAC TEAR DUCT SURGERY  Family History: Last updated: 2009-08-16 Father: DECEASED 59 YOA; CA COLON Mother: DIED 34 YOA?CROHNS ALZHEIMERS /ANGINA/HTN Siblings: BROTHERS 3/1BROTHER ETOH,1 BROTHERSLEEP APNEA SISTERS:2 / 1 dec 68  BREAST CA/ ANGINA/EMPHYSEMA (SMOKER) O2 TX. 1 SISTER HTN BACK SURG. 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 Eyes:  Denies blurring and eye irritation. CV:  Denies chest pain or discomfort, palpitations, shortness of breath with exertion, and swelling of feet. Resp:  Denies cough, shortness of breath, sputum productive, and wheezing. GI:  Denies abdominal pain, change in bowel habits, indigestion, and nausea. GU:  Denies urinary frequency. MS:  Denies muscle aches and cramps. Derm:  Denies itching, lesion(s), poor wound healing, and rash. Neuro:  Denies memory loss, numbness, and tingling. Psych:  Complains of anxiety, easily tearful, and irritability; denies panic attacks, sense of great danger, and suicidal thoughts/plans. Endo:  Denies cold intolerance, excessive thirst, excessive urination, and heat intolerance. Heme:  Denies abnormal bruising and bleeding.  Physical Exam  General:  overweight but generally well appearing  Head:  normocephalic, atraumatic, no abnormalities observed, and no abnormalities palpated.   Eyes:  vision grossly intact, pupils equal, pupils round, and pupils reactive to light.  no conjunctival pallor, injection or icterus  Mouth:  pharynx pink and moist.  Neck:  supple with full rom and no masses or thyromegally, no JVD or carotid bruit  Lungs:  Normal respiratory effort, chest expands symmetrically. Lungs are clear to auscultation, no crackles or wheezes. Heart:  Normal rate and regular rhythm. S1 and S2 normal without gallop,  murmur, click, rub or other extra sounds. Abdomen:  Bowel sounds positive,abdomen soft and non-tender without masses, organomegaly or hernias noted. no renal bruits  Msk:  No deformity or scoliosis noted of thoracic or lumbar spine.  no acute joint changes  Pulses:  R and L carotid,radial,femoral,dorsalis pedis and posterior tibial pulses are full and equal bilaterally Extremities:  No clubbing, cyanosis, edema, or deformity noted with normal full range of motion of all joints.   Neurologic:  sensation intact to light touch, gait normal, and DTRs symmetrical and normal.   Skin:  Intact without suspicious lesions or rashes Cervical Nodes:  No lymphadenopathy noted Inguinal Nodes:  No significant adenopathy Psych:  seems anxious and almost tearful today   Impression & Recommendations:  Problem # 1:  HYPERTENSION (ICD-401.9) Assessment Deteriorated  bp is high because pt forgot to fill her cozaar  will get back on both meds bp nurse visit in 2 weeks  Her updated medication list for this problem includes:    Cozaar 50 Mg Tabs (Losartan potassium) .Marland Kitchen... 1 by mouth once daily    Amlodipine Besylate 5 Mg Tabs (Amlodipine besylate) .Marland Kitchen... Take 1 tablet by mouth once a day  BP today: 152/78 Prior BP: 138/66 (08/08/2009)  Labs Reviewed: K+: 4.0 (07/17/2009) Creat: : 1.0 (07/17/2009)   Chol: 194 (07/17/2009)   HDL: 59.40 (07/17/2009)   LDL: 112 (07/17/2009)   TG: 112.0 (07/17/2009)  Orders: Prescription Created Electronically [phone])  Problem # 2:  STRESS REACTION, ACUTE, WITH EMOTIONAL DISTURBANCE (ICD-308.0) pt is exhausted and stressed from working full time and taking care of family with alz and also taking care of herself  I explained that something much change in order for her to preserve her physical and mental health I enc her to talk to her son/ boss about cutting her hours at work-- she is apprehensive disc symptoms/ coping mech/ tx opt and side eff in detail -- pt not interested  in counseling or med at this time spent 25 minutes face to face time with pt , over 50% of which was spent on counseling and coordination of care   Complete Medication List: 1)  Levoxyl 75 Mcg Tabs (Levothyroxine sodium) .... Take 1 tablet by mouth once a day 2)  Citracal + D 315-200 Mg-unit Tabs (Calcium citrate-vitamin d) .Marland Kitchen.. 1 twice a day 3)  Arimidex 1 Mg Tabs (Anastrozole) .... Take one tab once daily 4)  B Complex-b12 Tabs (B complex vitamins) .... Once daily 5)  Vitamin D 1000 Unit Tabs (Cholecalciferol) .... Take once daily 6)  Ascorbic Acid 1000 Mg Tabs (Ascorbic acid) .... Take one tab once daily 7)  Aleve 220 Mg Tabs (Naproxen sodium) .Marland Kitchen.. 1 by mouth up to two times a day as needed pain with food 8)  Aspirin 81 Mg Tbec (Aspirin) .Marland Kitchen.. 1 by mouth once daily with food 9)  Cozaar 50 Mg Tabs (Losartan potassium) .Marland Kitchen.. 1 by mouth once daily 10)  Meclizine Hcl 25 Mg Tabs (Meclizine hcl) .... Take one tab up to three times a day as needed for vertigo 11)  Amlodipine Besylate 5 Mg Tabs (Amlodipine besylate) .... Take 1 tablet by mouth once a day  Patient Instructions: 1)  you cannot  work full time and care for someone with alzheimers and take care of yourself -- this is impossible 2)  please talk to your son about cutting your hours  3)  this is too emotionally and physically draining  4)  start back on cozaar (losartan) - you should be on that and amlodipine  5)  if you run out of any medicine please call pharmacy or our office for refils  6)  follow up with nurse visit in 2 weeks for blood pressure check  Prescriptions: AMLODIPINE BESYLATE 5 MG TABS (AMLODIPINE BESYLATE) Take 1 tablet by mouth once a day  #30 x 11   Entered and Authorized by:   Judith Part MD   Signed by:   Judith Part MD on 10/09/2009   Method used:   Electronically to        [facility] [address]* (retail)       [address]       [place], [place]  [address]       Ph: [phone]       Fax:  [phone]   RxID:   [id number] COZAAR 50 MG TABS (LOSARTAN POTASSIUM) 1 by mouth once daily  #30 x 11   Entered and Authorized by:   Judith Part MD   Signed by:   Judith Part MD on 10/09/2009   Method used:   Electronically to        [facility] [address] (retail)       [address]       [place], [place]  [address]       Ph: [phone]       Fax: [phone]   RxID:   [id number]   Current Allergies (reviewed today): ! ZESTRIL (LISINOPRIL) ! ERY-TAB (ERYTHROMYCIN BASE) ! ATENOLOL

## 2010-04-09 NOTE — Progress Notes (Signed)
Summary: refrigerater fell on her monday  Phone Note Call from Patient Call back at Home Phone [phone]   Caller: Patient Call For: Judith Part MD Summary of Call: Patient called to schedule app because she says that a large refrigerater fell on her Monday of this week and she is very sore and bruised. She said that she did not go to emergency room because she knew nothing was broken. I was going to set her up app with Dr. Patsy Lager for tomorrow, but she says that she could not come tomorrow and that she wants to see you.  Initial call taken by: Melody Comas,  May 16, 2009 2:12 PM  Follow-up for Phone Call        unfortunately cannot add her on -- so I recommend [facility] for eval if needs to be seen today  Follow-up by: Judith Part MD,  May 16, 2009 2:18 PM  Additional Follow-up for Phone Call Additional follow up Details #1::        Scheduled app for Friday.  Additional Follow-up by: Melody Comas,  May 16, 2009 2:50 PM

## 2010-04-09 NOTE — Assessment & Plan Note (Signed)
Summary: refrigerater fell on her Monday, really bruised/ alc   Vital Signs:  Patient profile:   77 year old female Height:      61.4 inches Weight:      151.75 pounds BMI:     28.40 Temp:     97.9 degrees F oral Pulse rate:   60 / minute Pulse rhythm:   regular BP sitting:   132 / 80  (left arm) Cuff size:   regular  Vitals Entered By: Lewanda Rife LPN (May 18, 2009 4:05 PM)  History of Present Illness: on monday - turned her refrigerator and it rolled over on her when trying to clean it  knocked the wind out of her   R leg and R shoulder took the brunt of the injury  little bit of her chest  did not get pinned under  no loc or hit her head   did not think she broke anything  pain started the next day   is still sore if she uses her R arm if she extends it or reaches over her head  hurts lateral chest and into arm  some shoulder pain at first - that is improved   leg hurts mostly over top of leg- where most of the bruising is   is very badly bruised in fact   no numbness or weakness   Allergies: 1)  ! Zestril (Lisinopril) 2)  ! Ery-Tab (Erythromycin Base)  Past History:  Past Medical History: Last updated: 07/01/2007 Anxiety: 12/2002 Diabetes mellitus, type II: 11/2004 Hyperlipidemia: 11/2004 Hypertension: 12/2002 spinal stenosis  hypothyroid after radio ablation of thyroid   endo---Dr [facility]  Past Surgical History: Last updated: 07-18-06  01/1998: COLONOSCOPY ([facility]'S) NEG. POLYOS ([place]) 02/2002: DEXA IMPROVED  OSTEOPENIA 02/26/2004: DEXA (DR.LOMAX) STABLE/ IMR FEMUR/SPINE 07/10/1987 CHOLEYCYSTECTOMY 1990 HYSTERECTOMY  ENDOMETRIAL ADENOCA 1980 L BREAST LUMPECTOMY 1980'S ABSCESS OF BREAST EVAC TEAR DUCT SURGERY  Family History: Last updated: 18-Jul-2006 Father: DECEASED 38 YOA; CA COLON Mother: DIED 46 YOA?CROHNS ALZHEIMERS /ANGINA/HTN Siblings: BROTHERS 3/1BROTHER ETOH,1 BROTHERSLEEP APNEA SISTERS:2 / 1 dec 68  BREAST CA/ ANGINA/EMPHYSEMA  (SMOKER) O2 TX. 1 SISTER HTN BACK SURG. 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 Eyes:  Denies blurring. CV:  Denies chest pain or discomfort and palpitations. Resp:  Denies cough and wheezing. GI:  Denies abdominal pain, bloody stools, change in bowel habits, nausea, and vomiting. MS:  Complains of joint pain, muscle aches, and stiffness; denies joint redness, joint swelling, cramps, and muscle weakness. Derm:  Denies lesion(s), poor wound healing, and rash. Neuro:  Denies numbness and tingling. Psych:  Denies anxiety and depression. Heme:  Denies abnormal bruising and bleeding.  Physical Exam  General:  overweight but generally well appearing  Head:  normocephalic, atraumatic, and no abnormalities observed.  no facial or head trauma  Eyes:  vision grossly intact, pupils equal, pupils round, and pupils reactive to light.   Mouth:  pharynx pink and moist.   Neck:  supple  with full rom and no masses or thyromegally, no JVD or carotid bruit  Chest Wall:  No deformities, masses, or tenderness noted. Lungs:  Normal respiratory effort, chest expands symmetrically. Lungs are clear to auscultation, no crackles or wheezes. Heart:  Normal rate and regular rhythm. S1 and S2 normal without gallop, murmur, click, rub or other extra sounds. Abdomen:  Bowel sounds  positive,abdomen soft and non-tender without masses, organomegaly or hernias noted. Msk:  full rom of R shoulder with pain in abd over 90 deg  int/ext rot are uncomfortable but full  mild pain on hawking's maneuver some ant swelling (see skin exam for bruising)  mild tenderness and swelling over area of ecchymosis R lat leg ( superior femur) nl rom hip and leg  nl gait with full wt bearing Pulses:  R and L carotid,radial,femoral,dorsalis pedis and posterior tibial pulses are full and equal bilaterally Neurologic:  strength normal in all extremities, sensation intact to light touch, gait normal, and DTRs symmetrical and normal.   Skin:  large old ecchymosis over medial R arm / lower shoulder and chest wall  mild tenderness in deltoid area and lateral chest wall without crepitice  Cervical Nodes:  No lymphadenopathy noted Psych:  normal affect, talkative and pleasant    Impression & Recommendations:  Problem # 1:  CONTUSION, ARM (ICD-923.9) Assessment New contusions on arm and leg from recent blunt trauma no signs of broken bones- in fact healing well will watch for any signs of frozen shoulder- recommended passive rom exercises to use  adv heat/ gentle stretch and tylenol as needed   Complete Medication List: 1)  Atenolol 25 Mg Tabs (Atenolol) .... 1/2 tablet by mouth once a day 2)  Levoxyl 75 Mcg Tabs (Levothyroxine sodium) .... Take 1 tablet by mouth once a day 3)  Citracal + D 315-200 Mg-unit Tabs (Calcium citrate-vitamin d) .Marland Kitchen.. 1 twice a day 4)  Arimidex 1 Mg Tabs (Anastrozole) .... Take one tab once daily 5)  B Complex-b12 Tabs (B complex vitamins) .... Once daily 6)  Vitamin D 1000 Unit Tabs (Cholecalciferol) .... Take once daily 7)  Ascorbic Acid 1000 Mg Tabs (Ascorbic acid) .... Take one tab once daily 8)  Aleve 220 Mg Tabs (Naproxen sodium) .Marland Kitchen.. 1 by mouth up to two times a day as needed pain with food 9)  Aspirin 81 Mg Tbec (Aspirin) .Marland Kitchen.. 1 by mouth once daily with  food  Patient Instructions: 1)  you can try warm compress on arm and leg if needed  2)  tylenol is ok for pain  3)  if that does not work -- can try occasional aleve 4)  if pain worsens or other symptoms - let me know   Current Allergies (reviewed today): ! ZESTRIL (LISINOPRIL) ! ERY-TAB (ERYTHROMYCIN BASE)

## 2010-04-29 ENCOUNTER — Telehealth: Payer: Self-pay | Admitting: Family Medicine

## 2010-04-30 ENCOUNTER — Ambulatory Visit (INDEPENDENT_AMBULATORY_CARE_PROVIDER_SITE_OTHER): Payer: Medicare Other | Admitting: Family Medicine

## 2010-04-30 ENCOUNTER — Encounter: Payer: Self-pay | Admitting: Family Medicine

## 2010-04-30 ENCOUNTER — Other Ambulatory Visit: Payer: Self-pay | Admitting: Family Medicine

## 2010-04-30 ENCOUNTER — Ambulatory Visit: Payer: Self-pay | Admitting: Family Medicine

## 2010-04-30 DIAGNOSIS — I10 Essential (primary) hypertension: Secondary | ICD-10-CM

## 2010-04-30 DIAGNOSIS — E039 Hypothyroidism, unspecified: Secondary | ICD-10-CM | POA: Insufficient documentation

## 2010-04-30 DIAGNOSIS — Z862 Personal history of diseases of the blood and blood-forming organs and certain disorders involving the immune mechanism: Secondary | ICD-10-CM

## 2010-04-30 DIAGNOSIS — E785 Hyperlipidemia, unspecified: Secondary | ICD-10-CM

## 2010-04-30 DIAGNOSIS — K5900 Constipation, unspecified: Secondary | ICD-10-CM

## 2010-04-30 DIAGNOSIS — Z8632 Personal history of gestational diabetes: Secondary | ICD-10-CM

## 2010-04-30 DIAGNOSIS — Z8639 Personal history of other endocrine, nutritional and metabolic disease: Secondary | ICD-10-CM

## 2010-04-30 LAB — CBC WITH DIFFERENTIAL/PLATELET
HCT: 39 % (ref 36.0–46.0)
Hemoglobin: 13.4 g/dL (ref 12.0–15.0)
MCV: 94.7 fl (ref 78.0–100.0)
Monocytes Absolute: 0.7 10*3/uL (ref 0.1–1.0)
Neutrophils Relative %: 71.9 % (ref 43.0–77.0)
Platelets: 281 10*3/uL (ref 150.0–400.0)
RBC: 4.11 Mil/uL (ref 3.87–5.11)

## 2010-05-01 LAB — LIPID PANEL
HDL: 56.7 mg/dL (ref >39.00)
Triglycerides: 113 mg/dL (ref 0.0–149.0)
VLDL: 22.6 mg/dL (ref 0.0–40.0)

## 2010-05-01 LAB — RENAL FUNCTION PANEL
Albumin: 4 g/dL (ref 3.5–5.2)
CO2: 29 mEq/L (ref 19–32)
Calcium: 10.1 mg/dL (ref 8.4–10.5)
Chloride: 106 mEq/L (ref 96–112)
Creatinine, Ser: 0.8 mg/dL (ref 0.4–1.2)
Glucose, Bld: 89 mg/dL (ref 70–99)
Phosphorus: 3.8 mg/dL (ref 2.3–4.6)
Sodium: 142 mEq/L (ref 135–145)

## 2010-05-01 LAB — TSH: TSH: 0.54 u[IU]/mL (ref 0.35–5.50)

## 2010-05-01 LAB — HEPATIC FUNCTION PANEL
ALT: 25 U/L (ref 0–35)
AST: 26 U/L (ref 0–37)
Albumin: 4 g/dL (ref 3.5–5.2)

## 2010-05-07 NOTE — Progress Notes (Signed)
Summary: discuss BP meds with pt  Phone Note From Pharmacy   Caller: [facility] [address] [phone]* Summary of Call: Pt is coming in for a visit later this week and pharmacist is asking that you go over her BP meds with her.  He said she is a little confused.  He has gone over them but would like your back up.                  Lowella Petties CMA, AAMA  April 29, 2010 10:04 AM   Follow-up for Phone Call        thanks - I will be sure to do that in detail and write it out again  it may be helpful for her to bring family member with her  Follow-up by: Judith Part MD,  April 29, 2010 1:53 PM

## 2010-05-07 NOTE — Assessment & Plan Note (Signed)
Summary: 6 MONTH FOLLOWUP/RI   Vital Signs:  Patient profile:   77 year old female Weight:      156 pounds BMI:     29.20 Temp:     98.3 degrees F oral Pulse rate:   72 / minute Pulse rhythm:   regular BP sitting:   150 / 70  (left arm) Cuff size:   regular  Vitals Entered By: Mervin Hack CMA Duncan Dull) (April 30, 2010 2:08 PM) CC: follow-up   History of Present Illness: here for f/u of HTN and lipids and thyroid   wt is down 1 lb bmi is 29  150/70 first check today  pharmacist called to make sure we confirmed her bp meds with her -- she has had confusion in the past she got confused about old atenolol  is on both medicines  took both her meds   no trouble with any of her other medicines   no more vertigo lately- has meclizine for as needed   still having trouble with constipation    is time for her colonoscopy  - last one was in [place]  wants to go there if poss she uses miralax -- just as needed  does take fiber and stool softener     Allergies: 1)  ! Zestril (Lisinopril) 2)  ! Ery-Tab (Erythromycin Base) 3)  ! Atenolol  Past History:  Past Medical History: Last updated: 07/01/2007 Anxiety: 12/2002 Diabetes mellitus, type II: 11/2004 Hyperlipidemia: 11/2004 Hypertension: 12/2002 spinal stenosis  hypothyroid after radio ablation of thyroid   endo---Dr [facility]  Past Surgical History: Last updated: 07/06/2006  01/1998: COLONOSCOPY ([facility]) NEG. POLYOS ([place]) 02/2002: DEXA IMPROVED  OSTEOPENIA 02/26/2004: DEXA (DR.LOMAX) STABLE/ IMR FEMUR/SPINE 07/10/1987 CHOLEYCYSTECTOMY 1990 HYSTERECTOMY  ENDOMETRIAL ADENOCA 1980 L BREAST LUMPECTOMY 1980'S ABSCESS OF BREAST EVAC TEAR DUCT SURGERY  Family History: Last updated: 15-Aug-2009 Father: DECEASED 36 YOA; CA COLON Mother: DIED 61 YOA?CROHNS ALZHEIMERS /ANGINA/HTN Siblings: BROTHERS 3/1BROTHER ETOH,1 BROTHERSLEEP APNEA SISTERS:2 / 1 dec 68  BREAST CA/ ANGINA/EMPHYSEMA (SMOKER) O2  TX. 1 SISTER HTN BACK SURG. 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 Eyes:  Denies blurring and eye irritation. CV:  Denies chest pain or discomfort and lightheadness. Resp:  Denies cough, shortness of breath, and wheezing. GI:  Complains of constipation; denies dark tarry stools and diarrhea. GU:  Denies dysuria and urinary frequency. MS:  Denies joint redness, joint swelling, and muscle aches. Derm:  Denies itching, lesion(s), poor wound healing, and rash. Neuro:  Denies memory loss, numbness, and tingling. Psych:  Denies anxiety and depression. Endo:  Denies cold intolerance, excessive thirst, excessive urination, and heat intolerance. Heme:  Denies abnormal bruising and bleeding.  Physical Exam  General:  overweight but generally well appearing  Head:  normocephalic, atraumatic, and no abnormalities observed.   Eyes:  vision grossly intact, pupils equal, pupils round, and pupils reactive to light.   Ears:  R ear normal  and L ear normal.  scant cerumen Mouth:  pharynx pink and moist.   Neck:  supple with full rom and no masses or thyromegally, no JVD or carotid bruit  Chest Wall:  No deformities, masses, or tenderness noted. Lungs:  Normal respiratory effort, chest expands symmetrically. Lungs are clear to auscultation, no  crackles or wheezes. Heart:  Normal rate and regular rhythm. S1 and S2 normal without gallop, murmur, click, rub or other extra sounds. Abdomen:  Bowel sounds positive,abdomen soft and non-tender without masses, organomegaly or hernias noted. no renal bruits  Msk:  No deformity or scoliosis noted of thoracic or lumbar spine.  no acute joint changes  Pulses:  R and L carotid,radial,femoral,dorsalis pedis and posterior tibial pulses are full and equal bilaterally Extremities:  No clubbing, cyanosis, edema, or deformity noted with normal full range of motion of all joints.   Neurologic:  sensation intact to light touch, gait normal, and DTRs symmetrical and normal.  no tremor  Skin:  Intact without suspicious lesions or rashes Cervical Nodes:  No lymphadenopathy noted Inguinal Nodes:  No significant adenopathy Psych:  less anxious today- is talkative   Impression & Recommendations:  Problem # 1:  HYPOTHYROIDISM (ICD-244.9) Assessment Unchanged  labs today - no clinical changes  Her updated medication list for this problem includes:    Levoxyl 75 Mcg Tabs (Levothyroxine sodium) .Marland Kitchen... Take 1 tablet by mouth once a day  Orders: Venipuncture (47829) TLB-Lipid Panel (80061-LIPID) TLB-Renal Function Panel (80069-RENAL) TLB-CBC Platelet - w/Differential (85025-CBCD) TLB-Hepatic/Liver Function Pnl (80076-HEPATIC) TLB-TSH (Thyroid Stimulating Hormone) (84443-TSH)  Labs Reviewed: TSH: 1.16 (07/17/2009)    HgBA1c: 5.9 (07/17/2009) Chol: 194 (07/17/2009)   HDL: 59.40 (07/17/2009)   LDL: 112 (07/17/2009)   TG: 112.0 (07/17/2009)  Problem # 2:  HYPERTENSION (ICD-401.9) Assessment: Deteriorated  this is not optimal  rev meds in detail inc cozaar to 100 mg  lab today f/u 4-6 wk Her updated medication list for this problem includes:    Cozaar 100 Mg Tabs (Losartan potassium) .Marland Kitchen... 1 by mouth once daily    Amlodipine Besylate 5 Mg Tabs (Amlodipine besylate) .Marland Kitchen... Take 1 tablet by mouth once a  day  Orders: Venipuncture (56213) TLB-Lipid Panel (80061-LIPID) TLB-Renal Function Panel (80069-RENAL) TLB-CBC Platelet - w/Differential (85025-CBCD) TLB-Hepatic/Liver Function Pnl (80076-HEPATIC) TLB-TSH (Thyroid Stimulating Hormone) (08657-QIO) Prescription Created Electronically [phone])  BP today: 150/70 Prior BP: 140/66 (10/23/2009)  Labs Reviewed: K+: 4.0 (07/17/2009) Creat: : 1.0 (07/17/2009)   Chol: 194 (07/17/2009)   HDL: 59.40 (07/17/2009)   LDL: 112 (07/17/2009)   TG: 112.0 (07/17/2009)  Problem # 3:  HYPERLIPIDEMIA (ICD-272.4) Assessment: Unchanged  lab today disc low sat fat diet  has little time to care for herself  will rev labs at f/u  Orders: Venipuncture (28413) TLB-Lipid Panel (80061-LIPID) TLB-Renal Function Panel (80069-RENAL) TLB-CBC Platelet - w/Differential (85025-CBCD) TLB-Hepatic/Liver Function Pnl (80076-HEPATIC) TLB-TSH (Thyroid Stimulating Hormone) (84443-TSH)  Labs Reviewed: SGOT: 21 (07/17/2009)   SGPT: 21 (07/17/2009)   HDL:59.40 (07/17/2009), 41.10 (01/17/2009)  LDL:112 (07/17/2009), 127 (01/17/2009)  Chol:194 (07/17/2009), 197 (01/17/2009)  Trig:112.0 (07/17/2009), 145.0 (01/17/2009)  Problem # 4:  CONSTIPATION (ICD-564.00) Assessment: New chronic and worsening  check thyroid get on more consistent regimen with miralax nl exam  at f/u will sched colonosc  Her updated medication list for this problem includes:    Miralax Powd (Polyethylene glycol 3350) .Marland Kitchen... As directed for constipation  Complete Medication List: 1)  Cozaar 100 Mg Tabs (Losartan potassium) .Marland Kitchen.. 1 by mouth once daily 2)  Amlodipine  Besylate 5 Mg Tabs (Amlodipine besylate) .... Take 1 tablet by mouth once a day 3)  Levoxyl 75 Mcg Tabs (Levothyroxine sodium) .... Take 1 tablet by mouth once a day 4)  Citracal + D 315-200 Mg-unit Tabs (Calcium citrate-vitamin d) .Marland Kitchen.. 1 twice a day 5)  B Complex-b12 Tabs (B complex vitamins) .... Once daily 6)  Vitamin D 1000 Unit Tabs  (Cholecalciferol) .... Take once daily 7)  Ascorbic Acid 1000 Mg Tabs (Ascorbic acid) .... Take one tab once daily 8)  Aleve 220 Mg Tabs (Naproxen sodium) .Marland Kitchen.. 1 by mouth up to two times a day as needed pain with food 9)  Aspirin 81 Mg Tbec (Aspirin) .Marland Kitchen.. 1 by mouth once daily with food 10)  Meclizine Hcl 25 Mg Tabs (Meclizine hcl) .... Take one tab up to three times a day as needed for vertigo 11)  Miralax Powd (Polyethylene glycol 3350) .... As directed for constipation  Patient Instructions: 1)  try miralax every day or every other day for constipation  2)  at your next visit we will refer you for your colonoscopy 3)  increase your losartan from 50 to 100 mg once daily 4)  the pills you have left- take 2 pills one time daily 5)  the new px will be for one 100 mg pill daily 6)  do not change the amlodipine- continue to take it  7)  no change in other medicines either 8)  labs today 9)  follow up with me in 4-6 weeks  Prescriptions: COZAAR 100 MG TABS (LOSARTAN POTASSIUM) 1 by mouth once daily  #30 x 11   Entered and Authorized by:   Judith Part MD   Signed by:   Judith Part MD on 04/30/2010   Method used:   Electronically to        [facility] [address] (retail)       [address]       [place], [place]  [address]       Ph: [phone]       Fax: [phone]   RxID:   [id number]    Orders Added: 1)  Venipuncture [84132] 2)  TLB-Lipid Panel [80061-LIPID] 3)  TLB-Renal Function Panel [80069-RENAL] 4)  TLB-CBC Platelet - w/Differential [85025-CBCD] 5)  TLB-Hepatic/Liver Function Pnl [80076-HEPATIC] 6)  TLB-TSH (Thyroid Stimulating Hormone) [84443-TSH] 7)  Prescription Created Electronically [G8553] 8)  Est. Patient Level IV [44010]    Current Allergies (reviewed today): ! ZESTRIL (LISINOPRIL) ! ERY-TAB (ERYTHROMYCIN BASE) ! ATENOLOL

## 2010-06-08 ENCOUNTER — Encounter: Payer: Self-pay | Admitting: Family Medicine

## 2010-06-12 ENCOUNTER — Ambulatory Visit: Payer: BLUE CROSS/BLUE SHIELD | Admitting: Family Medicine

## 2010-07-08 ENCOUNTER — Encounter: Payer: Self-pay | Admitting: Family Medicine

## 2010-07-08 ENCOUNTER — Ambulatory Visit (INDEPENDENT_AMBULATORY_CARE_PROVIDER_SITE_OTHER): Payer: Medicare Other | Admitting: Family Medicine

## 2010-07-08 ENCOUNTER — Ambulatory Visit (INDEPENDENT_AMBULATORY_CARE_PROVIDER_SITE_OTHER)
Admission: RE | Admit: 2010-07-08 | Discharge: 2010-07-08 | Disposition: A | Discharge status: Home / Self Care | Payer: Medicare Other | Source: Ambulatory Visit | Attending: Family Medicine | Admitting: Family Medicine

## 2010-07-08 DIAGNOSIS — M949 Disorder of cartilage, unspecified: Secondary | ICD-10-CM

## 2010-07-08 DIAGNOSIS — R071 Chest pain on breathing: Secondary | ICD-10-CM

## 2010-07-08 DIAGNOSIS — R0789 Other chest pain: Secondary | ICD-10-CM

## 2010-07-08 DIAGNOSIS — Z1211 Encounter for screening for malignant neoplasm of colon: Secondary | ICD-10-CM

## 2010-07-08 DIAGNOSIS — E785 Hyperlipidemia, unspecified: Secondary | ICD-10-CM

## 2010-07-08 DIAGNOSIS — E039 Hypothyroidism, unspecified: Secondary | ICD-10-CM

## 2010-07-08 DIAGNOSIS — I10 Essential (primary) hypertension: Secondary | ICD-10-CM

## 2010-07-08 DIAGNOSIS — R7309 Other abnormal glucose: Secondary | ICD-10-CM

## 2010-07-08 DIAGNOSIS — R4589 Other symptoms and signs involving emotional state: Secondary | ICD-10-CM

## 2010-07-08 NOTE — Progress Notes (Signed)
Subjective:    Patient ID: [name], female    DOB: [birth year], 77 y.o.   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) Feeling fair overall   Got some abd soreness after lifting some heavy laundry baskets  Sore to the touch and hurts to take a deep breath  Also has to urinate frequently at night  Overactive bladder from the hysterectomy - was contemplating surgery in the past  Her gyn was Dr Nicholas Lose in the past  Is up checking on husband frequently as well- not much sleep  Some home nursing to help  Is really hard - she is getting tired     Wt is up 2 lb with bmi of 29  HTN in fair control with 132/64 today  No headache or edema or cp No med side effects - on antivert  Hyperglycemia diet controlled Latest fasting sugar was 89 No symptoms of low sugar Eating low glycemic diet for the most part    Hypothyroid - tsh still theraputic at current dose Clinically-- if feeling about the same with that  No skin or hair or wt changes   Stays anxious a lot -with current situation -- does not want med  Feels like she just worries more as she gets older  Cares for alz pt  This is extremely stressful  She has not had counseling   Cholesterol is good - even better  Lab Results  Component Value Date   CHOL 170 04/30/2010   CHOL 194 07/17/2009   CHOL 197 01/17/2009   Lab Results  Component Value Date   HDL 56.70 04/30/2010   HDL 40.98 07/17/2009   HDL 11.91 01/17/2009   Lab Results  Component Value Date   LDLCALC 91 04/30/2010   LDLCALC 112* 07/17/2009   LDLCALC 127* 01/17/2009   Lab Results  Component Value Date   TRIG 113.0 04/30/2010   TRIG 112.0 07/17/2009   TRIG 145.0 01/17/2009   Lab Results  Component Value Date   CHOLHDL 3 04/30/2010   CHOLHDL 3 07/17/2009   CHOLHDL 5 01/17/2009   loves popcorn shrimp but avoids it     Review of Systems Review of Systems  Constitutional: Negative for  fever, appetite change, fatigue and unexpected weight change.  Eyes: Negative for pain and visual disturbance.  Respiratory: Negative for cough and shortness of breath.   Cardiovascular: neg for sob or leg pain Pos for chest wall pain Gastrointestinal: Negative for nausea, diarrhea and constipation.  Genitourinary: Negative for urgency and frequency.  Skin: Negative for pallor.  Neurological: Negative for weakness, light-headedness, numbness and headaches.  Hematological: Negative for adenopathy. Does not bruise/bleed easily.  Psychiatric/Behavioral: Negative for dysphoric mood. Pos for anxiousness          Objective:   Physical Exam  Constitutional: She appears well-developed and well-nourished. No distress.       overwt and well appearing   HENT:  Head: Normocephalic and atraumatic.  Nose: Nose normal.  Mouth/Throat: Oropharynx is clear and moist.  Eyes: Conjunctivae and EOM are normal. Pupils are equal, round, and reactive to light.  Neck: Normal range of motion. Neck supple. No JVD present. Carotid bruit is not present. No thyromegaly present.  Cardiovascular: Normal rate, regular rhythm and normal heart sounds.   Pulmonary/Chest: Effort normal and breath sounds normal. No respiratory distress. She has no wheezes. She exhibits tenderness.       Tender over lower  R chest wall/ ribs No crepitice or skin change   Abdominal: Soft. Bowel sounds are normal. She exhibits no distension and no mass. There is no tenderness.  Genitourinary:       No change in breast exam   Musculoskeletal: Normal range of motion. She exhibits no edema and no tenderness.  Lymphadenopathy:    She has no cervical adenopathy.  Neurological: She is alert. She has normal reflexes. Coordination normal.  Skin: Skin is warm and dry. No rash noted. No erythema. No pallor.  Psychiatric:       Mildly anxious but pleasant Good eye contact and comm skills  No SI          Assessment & Plan:

## 2010-07-08 NOTE — Assessment & Plan Note (Signed)
Suspect this is MSK from lifting heavy laundry- but in light of hx of breast cancer I would like to check chest x ray Will do this today on way out -pend report Adv using heat gently and update if symptoms worsen

## 2010-07-08 NOTE — Assessment & Plan Note (Signed)
With hx of plummer's dz in past Clinically stable theraputic tsh No change in dose Lab and f/u 6 mo

## 2010-07-08 NOTE — Assessment & Plan Note (Signed)
Pt is caring for spouse with alzheimers who can be agitated  Disc future care and getting help She declines counseling or med at this time Overall pos outlook and good coping skills

## 2010-07-08 NOTE — Assessment & Plan Note (Signed)
This is very well controlled with diet  Rev labs with pt  Rev low sat fat diet in detail

## 2010-07-08 NOTE — Assessment & Plan Note (Signed)
Great fasting sugar  Better diet  Problem is under control  Will continue to monitor

## 2010-07-08 NOTE — Assessment & Plan Note (Signed)
Very good control with current med and lifestyle Labs reviewed  Lab and f/u 6 mo

## 2010-07-08 NOTE — Patient Instructions (Signed)
We are doing chest x ray today for rib soreness - though I think it is probably strain from lifting laundry  Use a warm compress in the meantime  We should get a report tomorrow  Follow up with me in 6 months with labs prior  Labs look good today

## 2010-07-10 ENCOUNTER — Ambulatory Visit: Payer: Self-pay | Admitting: Internal Medicine

## 2010-07-10 DIAGNOSIS — Z1211 Encounter for screening for malignant neoplasm of colon: Secondary | ICD-10-CM | POA: Insufficient documentation

## 2010-07-23 NOTE — Op Note (Signed)
[name], [name]                ACCOUNT NO.:  [id number]   MEDICAL RECORD NO.:  [id number]          PATIENT TYPE:  AMB   LOCATION:  [facility]                          FACILITY:  [facility]   PHYSICIAN:  Leonie Man, M.D.   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   PROCEDURE:  Left-sided total mastectomy with blue dye injection and  sentinel lymph node biopsy.   SURGEON:  Leonie Man, MD   ASSISTANT:  Clovis Pu. Cornett, MD   ANESTHESIA:  General.   CLINICAL NOTE:  The patient is a 77 year old female on routine mammogram  noted to have a diffuse calcifications throughout the entire breast on  the left side with two discrete lesions in the central portion of the  breast which on biopsy both show ductal carcinoma in situ.  MRI confirms  the lesions seen in the breast with some calcifications in the  surrounding area.  The patient does have a family history of breast  carcinoma in her family in that her sister had breast cancer; however,  sister died earlier from metastatic colon cancer.  This patient today  has not yet undergone genetic testing.   The patient understands the risks and potential benefits of surgery and  gives her consent to same.   PROCEDURE:  The patient was positioned supinely and left arm extended  laterally.  The left breast is infiltrated with diluted methylene blue  dye in the subareolar area and this was massaged into the tissues.  The  left breast was then prepped and draped to be included in a sterile  operative field.  Preoperative precautions, identifying the patient as  [name], procedure to be done as left total mastectomy and sentinel  lymph node biopsy.  No airway problems and  preoperative antibiotics  given.   I made an elliptical incision extending from approximately two  fingerbreadths medial to the left sternal border carried down to the  anterior border of the latissimus dorsi.  Superior medial flap was  raised to the clavicle into the sternal border and an inferior lateral  flap raised to the rectus abdominis muscles and to the lateral border of  the latissimus dorsi.  I then with the use of the NeoProbe identified as  sentinel node in the left axilla.  This was dissected free and forwarded  for pathologic evaluation.  The resulting touch prep read by the  pathology showed no evidence of metastatic carcinoma within the nose, no  additional nodes were identified.  The breast tissue were then dissected  free from off of the pectoralis major muscle starting medially, taking  the anterior pectoralis fascia carrying dissection laterally over the  edge of the pectoralis major, the pectoralis minor, and over the  serratus anterior down to the latissimus.  The axillary content was  otherwise left in place and  this area was transected with clamps and  secured with ties of 2-0 Vicryl suture.  Specimens removed and forwarded  for pathologic evaluation.  After identifying, suture was placed in the  lateral aspect of the breast.  All areas of the dissection was then  checked for hemostasis.  The entire cavity was irrigated with normal  saline.  Sponge, instrument and sharp counts were verified.  Two 39-  [place] Blake drains were placed onto the flaps for drainage and the  wound closed in layers as follows.  The  subcutaneous tissue was closed with interrupted 2-0 Vicryl sutures.  The  skin closed with running 4-0 Monocryl suture reinforced with Steri-  Strips.  Sterile dressings applied.  The anesthetic was reversed.  The  patient removed from the operating room to the recovery room in stable  condition.  She tolerated the procedure well.      Leonie Man,  M.D.  Electronically Signed     PB/MEDQ  D:  01/27/2008  T:  01/27/2008  Job:  [id number]   cc:   Gretta Cool, M.D.

## 2010-08-09 ENCOUNTER — Ambulatory Visit: Payer: Self-pay | Admitting: Internal Medicine

## 2010-09-02 ENCOUNTER — Ambulatory Visit: Payer: Self-pay | Admitting: Gastroenterology

## 2010-10-02 ENCOUNTER — Encounter: Payer: Self-pay | Admitting: Family Medicine

## 2010-11-15 ENCOUNTER — Other Ambulatory Visit: Payer: Self-pay | Admitting: Family Medicine

## 2010-12-10 LAB — PROTIME-INR: INR: 1

## 2010-12-10 LAB — COMPREHENSIVE METABOLIC PANEL
ALT: 16
AST: 20
Alkaline Phosphatase: 56
CO2: 29
Chloride: 105
GFR calc Af Amer: >60
GFR calc non Af Amer: 55 — ABNORMAL LOW
Glucose, Bld: 104 — ABNORMAL HIGH
Sodium: 141
Total Bilirubin: 0.6

## 2010-12-10 LAB — DIFFERENTIAL
Basophils Absolute: 0
Basophils Relative: 0
Eosinophils Absolute: 0.2
Eosinophils Relative: 3

## 2010-12-10 LAB — CBC
Hemoglobin: 12.5
MCV: 92.7
RBC: 3.97
WBC: 9.1

## 2011-02-03 ENCOUNTER — Ambulatory Visit: Payer: Self-pay | Admitting: Internal Medicine

## 2011-05-15 ENCOUNTER — Other Ambulatory Visit: Payer: Self-pay | Admitting: Family Medicine

## 2011-05-28 ENCOUNTER — Other Ambulatory Visit: Payer: Self-pay | Admitting: Gynecology

## 2011-05-28 DIAGNOSIS — R350 Frequency of micturition: Secondary | ICD-10-CM | POA: Diagnosis not present

## 2011-05-28 DIAGNOSIS — Z01419 Encounter for gynecological examination (general) (routine) without abnormal findings: Secondary | ICD-10-CM | POA: Diagnosis not present

## 2011-05-28 DIAGNOSIS — Z9189 Other specified personal risk factors, not elsewhere classified: Secondary | ICD-10-CM | POA: Diagnosis not present

## 2011-06-02 ENCOUNTER — Other Ambulatory Visit: Payer: Self-pay | Admitting: Family Medicine

## 2011-06-02 NOTE — Telephone Encounter (Signed)
[facility] request refill levothyroxine 75 mcg. Pt last seen 07/08/10  To be rechecked in 6 months. Please advise.

## 2011-06-02 NOTE — Telephone Encounter (Signed)
Please schedule her f/u in April Will refill electronically

## 2011-07-02 ENCOUNTER — Telehealth: Payer: Self-pay | Admitting: Family Medicine

## 2011-07-02 DIAGNOSIS — R079 Chest pain, unspecified: Secondary | ICD-10-CM | POA: Diagnosis not present

## 2011-07-02 DIAGNOSIS — R198 Other specified symptoms and signs involving the digestive system and abdomen: Secondary | ICD-10-CM | POA: Diagnosis not present

## 2011-07-02 NOTE — Telephone Encounter (Signed)
Spoke with Dr. Milinda Antis and she stated that patient was not having in urgent chest pressure and could be seen at the next open appt.  Called patient at home number and got no answer or machine.  Will call back later.

## 2011-07-02 NOTE — Telephone Encounter (Signed)
Spoke with [facility] Pikes regarding appt for [name], they will call back on Friday to see if they can get an appt with Dr. Milinda Antis.

## 2011-07-02 NOTE — Telephone Encounter (Signed)
Phone call from GI - Dr Marva Panda- he saw pt today and she mentioned some intermittent chest pressure - this is likely anxiety related with many stressors but needs to be checked out  Please call her to make appt with me - thank s

## 2011-07-07 ENCOUNTER — Ambulatory Visit: Payer: Medicare Other | Admitting: Family Medicine

## 2011-07-11 ENCOUNTER — Ambulatory Visit: Payer: Self-pay | Admitting: Internal Medicine

## 2011-07-11 DIAGNOSIS — Z853 Personal history of malignant neoplasm of breast: Secondary | ICD-10-CM | POA: Diagnosis not present

## 2011-07-11 DIAGNOSIS — I10 Essential (primary) hypertension: Secondary | ICD-10-CM | POA: Diagnosis not present

## 2011-07-11 DIAGNOSIS — M4800 Spinal stenosis, site unspecified: Secondary | ICD-10-CM | POA: Diagnosis not present

## 2011-07-11 DIAGNOSIS — E039 Hypothyroidism, unspecified: Secondary | ICD-10-CM | POA: Diagnosis not present

## 2011-07-11 DIAGNOSIS — Z79899 Other long term (current) drug therapy: Secondary | ICD-10-CM | POA: Diagnosis not present

## 2011-07-11 DIAGNOSIS — Z9223 Personal history of estrogen therapy: Secondary | ICD-10-CM | POA: Diagnosis not present

## 2011-07-11 DIAGNOSIS — Z90710 Acquired absence of both cervix and uterus: Secondary | ICD-10-CM | POA: Diagnosis not present

## 2011-07-11 DIAGNOSIS — Z7982 Long term (current) use of aspirin: Secondary | ICD-10-CM | POA: Diagnosis not present

## 2011-07-11 DIAGNOSIS — Z170 Estrogen receptor positive status [ER+]: Secondary | ICD-10-CM | POA: Diagnosis not present

## 2011-07-11 DIAGNOSIS — Z9010 Acquired absence of unspecified breast and nipple: Secondary | ICD-10-CM | POA: Diagnosis not present

## 2011-07-11 LAB — CBC CANCER CENTER
Basophil #: 0.1 x10 3/mm (ref 0.0–0.1)
Basophil %: 0.7 %
Eosinophil #: 0.2 x10 3/mm (ref 0.0–0.7)
HCT: 39.6 % (ref 35.0–47.0)
HGB: 12.9 g/dL (ref 12.0–16.0)
Lymphocyte %: 18.9 %
MCH: 30.9 pg (ref 26.0–34.0)
MCHC: 32.7 g/dL (ref 32.0–36.0)
Monocyte %: 8.4 %
Neutrophil %: 69.3 %
RBC: 4.18 10*6/uL (ref 3.80–5.20)
RDW: 14.4 % (ref 11.5–14.5)

## 2011-07-11 LAB — HEPATIC FUNCTION PANEL A (ARMC)
Albumin: 3.9 g/dL (ref 3.4–5.0)
Total Protein: 7.4 g/dL (ref 6.4–8.2)

## 2011-07-11 LAB — CREATININE, SERUM: Creatinine: 0.77 mg/dL (ref 0.60–1.30)

## 2011-08-09 ENCOUNTER — Ambulatory Visit: Payer: Self-pay | Admitting: Internal Medicine

## 2011-09-09 ENCOUNTER — Other Ambulatory Visit: Payer: Self-pay | Admitting: Family Medicine

## 2011-09-09 MED ORDER — LOSARTAN POTASSIUM 100 MG PO TABS: 100.0000 mg | ORAL_TABLET | Freq: Every day | ORAL | Status: DC

## 2011-09-15 ENCOUNTER — Ambulatory Visit (INDEPENDENT_AMBULATORY_CARE_PROVIDER_SITE_OTHER)
Admission: RE | Admit: 2011-09-15 | Discharge: 2011-09-15 | Disposition: A | Discharge status: Home / Self Care | Payer: Medicare Other | Source: Ambulatory Visit | Attending: Family Medicine | Admitting: Family Medicine

## 2011-09-15 ENCOUNTER — Ambulatory Visit (INDEPENDENT_AMBULATORY_CARE_PROVIDER_SITE_OTHER): Payer: Medicare Other | Admitting: Family Medicine

## 2011-09-15 ENCOUNTER — Encounter: Payer: Self-pay | Admitting: Family Medicine

## 2011-09-15 VITALS — BP 130/68 | HR 78 | Temp 98.3°F | Ht 60.5 in | Wt 147.5 lb

## 2011-09-15 DIAGNOSIS — M25559 Pain in unspecified hip: Secondary | ICD-10-CM

## 2011-09-15 DIAGNOSIS — M169 Osteoarthritis of hip, unspecified: Secondary | ICD-10-CM | POA: Diagnosis not present

## 2011-09-15 DIAGNOSIS — R079 Chest pain, unspecified: Secondary | ICD-10-CM

## 2011-09-15 DIAGNOSIS — M25552 Pain in left hip: Secondary | ICD-10-CM

## 2011-09-15 DIAGNOSIS — M25859 Other specified joint disorders, unspecified hip: Secondary | ICD-10-CM | POA: Diagnosis not present

## 2011-09-15 DIAGNOSIS — R109 Unspecified abdominal pain: Secondary | ICD-10-CM | POA: Diagnosis not present

## 2011-09-15 DIAGNOSIS — M25551 Pain in right hip: Secondary | ICD-10-CM | POA: Insufficient documentation

## 2011-09-15 NOTE — Progress Notes (Signed)
Subjective:    Patient ID: [name], female    DOB: Apr 19, 1926, 78 y.o.   MRN: [id number]  HPI Here for a spot on her chest pressure around area where her mastectomy was in 09  Was tx with arimidex for 14 mo  This happens when she is stressed and worried , and lasts until she calms down - usually lasts minutes  Feels like pressure like something heavy is on it  No nausea or sweating   Feels a little bit lumpy to her   Does not think this is exertional, but sometimes happens when physically cares for her husband (he is in hospice)  Does not have enough help  She can hardly get along herself  Wants to care for husband as long as she can   A lot of trouble walking and pain in legs - esp the L groin area  Has arthritis Takes tylenol   Also feet swell Also has a knot on L forearm - sore to the touch  Patient Active Problem List  Diagnosis  . NEOPLASM, MALIGNANT, BREAST  . PLUMMER'S DISEASE  . HYPERLIPIDEMIA  . ANXIETY  . STRESS REACTION, ACUTE, WITH EMOTIONAL DISTURBANCE  . HYPERTENSION  . BACK PAIN  . LEG PAIN, BILATERAL  . OSTEOPENIA  . HYPERGLYCEMIA  . HYPOTHYROIDISM  . CONSTIPATION  . Chest wall pain  . Special screening for malignant neoplasms, colon   Past Medical History  Diagnosis Date  . Anxiety 12/2002  . Diabetes mellitus 11/2004    Type 2  . Hyperlipidemia 11/2004  . Hypertension 12/2002  . Spinal stenosis   . 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 Patrecia Pace endocrinologist  . Noralee Stain 02/2002    02/2002 Dexa osteopenia// Dexa 02/26/2004 (Dr. Nicholas Lose) stable/IMR femur/spine   Past Surgical History  Procedure Date  . Abdominal hysterectomy 1990    endometrial adenoca  . Breast surgery 1980    left breast lumpectomy //abscess of breast evac.  Marland Kitchen Cholecystectomy 07/10/1987  . Eye surgery     Tear Duct surgery   History  Substance Use Topics  . Smoking status: Never Smoker   . Smokeless tobacco: Not on file    . Alcohol Use: No   Family History  Problem Relation Age of Onset  . Alzheimer's disease Mother   . Heart disease Mother 77    angina  . Hypertension Mother   . Crohn's disease Mother     ? crohns disease  . Cancer Father 22    colon cancer  . Cancer Sister 71    breast cancer   . COPD Sister     emphysema (smoker)  . Heart disease Sister     angina  . Alcohol abuse Brother   . Diabetes Paternal Aunt   . Stroke Neg Hx   . Hypertension Sister    Allergies  Allergen Reactions  . Atenolol     REACTION: bradycardia  . Erythromycin Base     REACTION: stomach upset  . Lisinopril     REACTION: cough  (20 mg)   Current Outpatient Prescriptions on File Prior to Visit  Medication Sig Dispense Refill  . amLODipine (NORVASC) 5 MG tablet TAKE ONE TABLET BY MOUTH EVERY DAY  90 tablet  10  . ascorbic acid (VITAMIN C) 1000 MG tablet Take 1,000 mg by mouth daily.        Marland Kitchen aspirin 81 MG tablet Take 81 mg by mouth daily. Daily with  food       . B COMPLEX VITAMINS PO Take 1 tablet by mouth daily.        . calcium citrate-vitamin D (CITRACAL+D) 315-200 MG-UNIT per tablet Take 1 tablet by mouth 2 (two) times daily.        . cholecalciferol (VITAMIN D) 1000 UNITS tablet Take 1,000 Units by mouth daily.        Marland Kitchen levothyroxine (SYNTHROID, LEVOTHROID) 75 MCG tablet TAKE ONE TABLET BY MOUTH EVERY DAY  90 tablet  0  . losartan (COZAAR) 100 MG tablet Take 1 tablet (100 mg total) by mouth daily.  90 tablet  0  . polyethylene glycol powder (GLYCOLAX/MIRALAX) powder Take by mouth daily as needed. As directed for constipation       . meclizine (ANTIVERT) 25 MG tablet Take 25 mg by mouth 3 (three) times daily as needed. As needed for vertigo; one tablet up to three times a day       . naproxen sodium (ALEVE) 220 MG tablet Take 220 mg by mouth 2 (two) times daily with a meal. 1 by mouth up to two times a day as needed for pain with food             Review of Systems Review of Systems   Constitutional: Negative for fever, appetite change, fatigue and unexpected weight change.  Eyes: Negative for pain and visual disturbance.  Respiratory: Negative for cough and shortness of breath.  neg for wheeze  Cardiovascular: Negative for palpitations/ PND/Orthopnea or leg edema Gastrointestinal: Negative for nausea, diarrhea and constipation.  Genitourinary: Negative for urgency and frequency.  Skin: Negative for pallor or rash   MSK pos for groin pain bilat with walking, neg for swollen joints  Neurological: Negative for weakness, light-headedness, numbness and headaches.  Hematological: Negative for adenopathy. Does not bruise/bleed easily.  Psychiatric/Behavioral: Negative for dysphoric mood. The patient is not nervous/anxious.         Objective:   Physical Exam  Constitutional: She appears well-developed and well-nourished. No distress.       Frail appearing elderly female  HENT:  Head: Normocephalic and atraumatic.  Mouth/Throat: Oropharynx is clear and moist.  Eyes: Conjunctivae and EOM are normal. Pupils are equal, round, and reactive to light. No scleral icterus.  Neck: Normal range of motion. Neck supple. No JVD present. Carotid bruit is not present. No tracheal deviation present. No thyromegaly present.  Cardiovascular: Normal rate, regular rhythm, normal heart sounds and intact distal pulses.  Exam reveals no gallop.   Pulmonary/Chest: Effort normal and breath sounds normal. No respiratory distress. She has no wheezes. She has no rales. She exhibits tenderness.       Mild chest tenderness L side at mastectomy site   Abdominal: Soft. Bowel sounds are normal. She exhibits no distension, no abdominal bruit and no mass. There is no tenderness.  Genitourinary:       Some mild tenderness at mastectomy site on the L No new lumps felt , scar tissue is evident No redness or s/s of infection No crepitice over ribs   Musculoskeletal: She exhibits tenderness.       Groin pain  reproducible bilat with hip flexion and rotation  No crepitice Nl rom knees  No LS tenderness Gait is slow with cane and somewhat labored   Lymphadenopathy:    She has no cervical adenopathy.  Neurological: She is alert. She has normal reflexes. No cranial nerve deficit. She exhibits normal muscle tone. Coordination normal.  Skin:  Skin is warm and dry. No rash noted. No erythema. No pallor.  Psychiatric: Her speech is normal and behavior is normal. Judgment and thought content normal. Her mood appears anxious. Her affect is not labile. She does not exhibit a depressed mood. She expresses no suicidal ideation.          Assessment & Plan:

## 2011-09-15 NOTE — Assessment & Plan Note (Signed)
Atypical and worse with stress EKG NSR with rate of 71 and no acute changes May be from emot stress or physical stress of caring for husband cxr today in light of breast cancer hx  Exam is re assuring

## 2011-09-15 NOTE — Assessment & Plan Note (Signed)
Worse when walking - groin and buttock pain  Xray of hips and pelvis today Disc tylenol dosing  Uses cane  Will update

## 2011-09-15 NOTE — Patient Instructions (Addendum)
EKG is re assuring but if your chest pain worsens please let me know I think stress plays a factor- try to get help at home as much as you can  We will do a chest xray and also hip xray today on the way out We will call you with results  Take tylenol for pain as needed -- 2 ES tylenol up to every 4-6 hours

## 2011-09-16 ENCOUNTER — Telehealth: Payer: Self-pay

## 2011-09-16 NOTE — Telephone Encounter (Signed)
Informed patient as instructed 

## 2011-09-16 NOTE — Telephone Encounter (Signed)
Have her hold her amlodipine for 1-2 weeks (norvasc) as that can cause swelling - then call and update me with how she is before we make a further plan

## 2011-09-16 NOTE — Telephone Encounter (Signed)
Patient states she is having swelling in both her feet, She can not come in a OV but wants to know if you can call her some medicine in to pharmacy.

## 2011-09-25 ENCOUNTER — Telehealth: Payer: Self-pay

## 2011-09-25 DIAGNOSIS — M25551 Pain in right hip: Secondary | ICD-10-CM

## 2011-09-25 DIAGNOSIS — M79609 Pain in unspecified limb: Secondary | ICD-10-CM

## 2011-09-25 DIAGNOSIS — M549 Dorsalgia, unspecified: Secondary | ICD-10-CM

## 2011-09-25 MED ORDER — TRAMADOL HCL 50 MG PO TABS: 50.0000 mg | ORAL_TABLET | Freq: Three times a day (TID) | ORAL | Status: AC | PRN

## 2011-09-25 NOTE — Telephone Encounter (Signed)
Informed patient as instructed she stated she will not have anybody around with her while she is on it.She stated she has numerous of falls lately.

## 2011-09-25 NOTE — Telephone Encounter (Signed)
Then I would feel more comfortable cancelling the px (I did send electronically) - all pain relievers above the otc ones have these side effects - I do not want to put her at a fall risk

## 2011-09-25 NOTE — Telephone Encounter (Signed)
Doing referral now  Will send px for ultram to [facility]- but please adv her that is medication is sedating and dizziness/ falls can occur- so she must always have someone with her when taking this medication

## 2011-09-25 NOTE — Telephone Encounter (Signed)
Informed patient as instructed 

## 2011-09-25 NOTE — Telephone Encounter (Signed)
Pt said lower back pain worse; tylenol and aleve not helping pain, cannot turn in bed back hurts so bad; uses cane or walker when walking. Pt is agreeable for orthopedic referral in [place] and will wait to hear from pt care coordinator. Pt also request pain med to [facility].Please advise.

## 2011-10-08 DIAGNOSIS — M545 Low back pain: Secondary | ICD-10-CM | POA: Diagnosis not present

## 2011-10-08 DIAGNOSIS — M1990 Unspecified osteoarthritis, unspecified site: Secondary | ICD-10-CM | POA: Diagnosis not present

## 2011-10-08 DIAGNOSIS — M25559 Pain in unspecified hip: Secondary | ICD-10-CM | POA: Diagnosis not present

## 2011-10-09 ENCOUNTER — Encounter (HOSPITAL_COMMUNITY): Payer: Self-pay | Admitting: Emergency Medicine

## 2011-10-09 ENCOUNTER — Emergency Department (HOSPITAL_COMMUNITY)
Admission: EM | Admit: 2011-10-09 | Discharge: 2011-10-11 | Disposition: A | Discharge status: Home / Self Care | Payer: Medicare Other | Attending: Emergency Medicine | Admitting: Emergency Medicine

## 2011-10-09 DIAGNOSIS — Z7982 Long term (current) use of aspirin: Secondary | ICD-10-CM | POA: Insufficient documentation

## 2011-10-09 DIAGNOSIS — E785 Hyperlipidemia, unspecified: Secondary | ICD-10-CM | POA: Insufficient documentation

## 2011-10-09 DIAGNOSIS — G319 Degenerative disease of nervous system, unspecified: Secondary | ICD-10-CM | POA: Insufficient documentation

## 2011-10-09 DIAGNOSIS — R112 Nausea with vomiting, unspecified: Secondary | ICD-10-CM | POA: Diagnosis not present

## 2011-10-09 DIAGNOSIS — R110 Nausea: Secondary | ICD-10-CM | POA: Diagnosis not present

## 2011-10-09 DIAGNOSIS — E079 Disorder of thyroid, unspecified: Secondary | ICD-10-CM | POA: Insufficient documentation

## 2011-10-09 DIAGNOSIS — M549 Dorsalgia, unspecified: Secondary | ICD-10-CM | POA: Diagnosis not present

## 2011-10-09 DIAGNOSIS — I10 Essential (primary) hypertension: Secondary | ICD-10-CM | POA: Diagnosis not present

## 2011-10-09 DIAGNOSIS — E119 Type 2 diabetes mellitus without complications: Secondary | ICD-10-CM | POA: Insufficient documentation

## 2011-10-09 DIAGNOSIS — Z853 Personal history of malignant neoplasm of breast: Secondary | ICD-10-CM | POA: Insufficient documentation

## 2011-10-09 DIAGNOSIS — Z9089 Acquired absence of other organs: Secondary | ICD-10-CM | POA: Insufficient documentation

## 2011-10-09 DIAGNOSIS — F411 Generalized anxiety disorder: Secondary | ICD-10-CM | POA: Insufficient documentation

## 2011-10-09 DIAGNOSIS — Z79899 Other long term (current) drug therapy: Secondary | ICD-10-CM | POA: Insufficient documentation

## 2011-10-09 HISTORY — DX: Malignant (primary) neoplasm, unspecified: C80.1

## 2011-10-09 NOTE — ED Notes (Signed)
Patient complains of nausea and lower back pain that started this morning. Patient also her for nursing home placement.

## 2011-10-09 NOTE — ED Notes (Signed)
AVW:[id number]<XB> Expected date:<BR> Expected time:<BR> Means of arrival:<BR> Comments:<BR> EMS/needs Nursing Home placement

## 2011-10-10 ENCOUNTER — Emergency Department (HOSPITAL_COMMUNITY): Payer: Medicare Other

## 2011-10-10 ENCOUNTER — Other Ambulatory Visit: Payer: Self-pay

## 2011-10-10 DIAGNOSIS — Z853 Personal history of malignant neoplasm of breast: Secondary | ICD-10-CM | POA: Diagnosis not present

## 2011-10-10 DIAGNOSIS — G319 Degenerative disease of nervous system, unspecified: Secondary | ICD-10-CM | POA: Diagnosis not present

## 2011-10-10 DIAGNOSIS — E119 Type 2 diabetes mellitus without complications: Secondary | ICD-10-CM | POA: Diagnosis not present

## 2011-10-10 DIAGNOSIS — R110 Nausea: Secondary | ICD-10-CM | POA: Diagnosis not present

## 2011-10-10 DIAGNOSIS — Z7982 Long term (current) use of aspirin: Secondary | ICD-10-CM | POA: Diagnosis not present

## 2011-10-10 DIAGNOSIS — F411 Generalized anxiety disorder: Secondary | ICD-10-CM | POA: Diagnosis not present

## 2011-10-10 DIAGNOSIS — E785 Hyperlipidemia, unspecified: Secondary | ICD-10-CM | POA: Diagnosis not present

## 2011-10-10 DIAGNOSIS — M549 Dorsalgia, unspecified: Secondary | ICD-10-CM | POA: Diagnosis not present

## 2011-10-10 DIAGNOSIS — E079 Disorder of thyroid, unspecified: Secondary | ICD-10-CM | POA: Diagnosis not present

## 2011-10-10 DIAGNOSIS — Z79899 Other long term (current) drug therapy: Secondary | ICD-10-CM | POA: Diagnosis not present

## 2011-10-10 DIAGNOSIS — Z9089 Acquired absence of other organs: Secondary | ICD-10-CM | POA: Diagnosis not present

## 2011-10-10 DIAGNOSIS — I10 Essential (primary) hypertension: Secondary | ICD-10-CM | POA: Diagnosis not present

## 2011-10-10 LAB — CBC WITH DIFFERENTIAL/PLATELET
Hemoglobin: 11.8 g/dL — ABNORMAL LOW (ref 12.0–15.0)
Lymphocytes Relative: 11 % — ABNORMAL LOW (ref 12–46)
Lymphs Abs: 1.5 10*3/uL (ref 0.7–4.0)
MCH: 30.9 pg (ref 26.0–34.0)
Monocytes Relative: 10 % (ref 3–12)
Neutro Abs: 9.9 10*3/uL — ABNORMAL HIGH (ref 1.7–7.7)
Neutrophils Relative %: 77 % (ref 43–77)
Platelets: 342 10*3/uL (ref 150–400)
RBC: 3.82 MIL/uL — ABNORMAL LOW (ref 3.87–5.11)
WBC: 13 10*3/uL — ABNORMAL HIGH (ref 4.0–10.5)

## 2011-10-10 LAB — COMPREHENSIVE METABOLIC PANEL
ALT: 8 U/L (ref 0–35)
Alkaline Phosphatase: 245 U/L — ABNORMAL HIGH (ref 39–117)
BUN: 20 mg/dL (ref 6–23)
CO2: 25 mEq/L (ref 19–32)
Chloride: 102 mEq/L (ref 96–112)
GFR calc Af Amer: 90 mL/min — ABNORMAL LOW (ref >90)
Glucose, Bld: 99 mg/dL (ref 70–99)
Potassium: 3.6 mEq/L (ref 3.5–5.1)
Sodium: 138 mEq/L (ref 135–145)
Total Bilirubin: 0.3 mg/dL (ref 0.3–1.2)
Total Protein: 6.8 g/dL (ref 6.0–8.3)

## 2011-10-10 LAB — URINALYSIS, ROUTINE W REFLEX MICROSCOPIC
Bilirubin Urine: NEGATIVE
Leukocytes, UA: NEGATIVE
Nitrite: NEGATIVE
Specific Gravity, Urine: 1.031 — ABNORMAL HIGH (ref 1.005–1.030)
Urobilinogen, UA: 1 mg/dL (ref 0.0–1.0)
pH: 5.5 (ref 5.0–8.0)

## 2011-10-10 LAB — POCT I-STAT TROPONIN I: Troponin i, poc: 0 ng/mL (ref 0.00–0.08)

## 2011-10-10 MED ORDER — TRAMADOL HCL 50 MG PO TABS
50.0000 mg | ORAL_TABLET | Freq: Four times a day (QID) | ORAL | Status: DC | PRN
  Administered 2011-10-10 – 2011-10-11 (×3): 50 mg via ORAL
  Filled 2011-10-10 (×3): qty 1

## 2011-10-10 MED ORDER — LOSARTAN POTASSIUM 50 MG PO TABS
100.0000 mg | ORAL_TABLET | Freq: Every day | ORAL | Status: DC
  Administered 2011-10-11 (×2): 100 mg via ORAL
  Filled 2011-10-10 (×2): qty 2

## 2011-10-10 NOTE — Progress Notes (Signed)
Correction this is correct sheet provided to pt for home health services MEDICARE-CERTIFIED HOME HEALTH AGENCIES [facility]  Agencies that are Medicare-Certified and are affiliated with [facility]  Home Health Agency  Telephone Number Address  [facility]  [facility] has ownership interest in this company; however, you are under no obligation to use this agency. [phone] or [phone] [address] [place], [place]  [address]    Agencies that are Medicare-Certified and are not affiliated with [facility] Agency  Telephone Number Address  [facility] [phone] Fax [phone] [address] [place], [place]  [address]  [facility] [phone] [address] [place], [place] [address]  [facility] [phone] [address] [place], [place]  [address]  [facility]  [phone] [address] [place], [place] [address]  [facility]  [phone] Fax [phone] [address] [place], [place]  [address]  [facility] [phone] or [phone] [address] [place], [place] [address]  [facility] [phone] [address] [place], [place] [address]  [facility] [phone]  [address] [place], [place] [address]  [facility] [phone] or [phone]  [phone] [place], [place]  [address]  [facility] [phone] Fax [phone] [address] [place], [place]  [address]  [facility]  [phone]  Fax [phone] [address] [place], [place]  [address]

## 2011-10-10 NOTE — ED Notes (Signed)
Pt walks with walker and holds on to the wall, shuffling gate.
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
## 2011-10-10 NOTE — ED Notes (Signed)
Friends of Family: Emily Filbert [phone] and Florina Ou [phone]

## 2011-10-10 NOTE — ED Notes (Signed)
Visitors in which have been cleared to visit.

## 2011-10-10 NOTE — ED Provider Notes (Addendum)
History     CSN: [id number]  Arrival date & time 10/09/11  2155   First MD Initiated Contact with Patient 10/10/11 0038      Chief Complaint  Patient presents with  . Nausea  . Back Pain    (Consider location/radiation/quality/duration/timing/severity/associated sxs/prior treatment) Patient is a 78 y.o. female presenting with vomiting. The history is provided by the patient. No language interpreter was used.  Emesis  This is a new problem. The current episode started 12 to 24 hours ago. Episode frequency: dry heaves. The problem has not changed since onset.There has been no fever. Pertinent negatives include no abdominal pain, no arthralgias, no chills, no cough, no diarrhea, no fever, no headaches, no myalgias, no sweats and no URI. Risk factors: none.    Past Medical History  Diagnosis Date  . Anxiety 12/2002  . Hyperlipidemia 11/2004  . Hypertension 12/2002  . Spinal stenosis   . Osteopenia 02/2002    02/2002 Dexa osteopenia// Dexa 02/26/2004 (Dr. Nicholas Lose) stable/IMR femur/spine  . Diabetes mellitus 11/2004    Type 2  . 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 Patrecia Pace endocrinologist  . Cancer     left breast cancer    Past Surgical History  Procedure Date  . Abdominal hysterectomy 1990    endometrial adenoca  . Breast surgery 1980    left breast lumpectomy //abscess of breast evac.  Marland Kitchen Cholecystectomy 07/10/1987  . Eye surgery     Tear Duct surgery    Family History  Problem Relation Age of Onset  . Alzheimer's disease Mother   . Heart disease Mother 74    angina  . Hypertension Mother   . Crohn's disease Mother     ? crohns disease  . Cancer Father 90    colon cancer  . Cancer Sister 39    breast cancer   . COPD Sister     emphysema (smoker)  . Heart disease Sister     angina  . Alcohol abuse Brother   . Diabetes Paternal Aunt   . Stroke Neg Hx   . Hypertension Sister     History  Substance Use Topics  . Smoking  status: Never Smoker   . Smokeless tobacco: Not on file  . Alcohol Use: No    OB History    Grav Para Term Preterm Abortions TAB SAB Ect Mult Living                  Review of Systems  Constitutional: Negative for fever, chills, diaphoresis and fatigue.  HENT: Negative for neck pain and neck stiffness.   Respiratory: Negative for cough, chest tightness and shortness of breath.   Cardiovascular: Negative for chest pain.  Gastrointestinal: Positive for nausea and vomiting. Negative for abdominal pain and diarrhea.  Musculoskeletal: Positive for back pain. Negative for myalgias and arthralgias.  Skin: Negative.   Neurological: Negative for facial asymmetry, weakness, numbness and headaches.  All other systems reviewed and are negative.    Allergies  Atenolol; Erythromycin base; and Lisinopril  Home Medications   Current Outpatient Rx  Name Route Sig Dispense Refill  . ASPIRIN 81 MG PO TABS Oral Take 81 mg by mouth daily. Daily with food     . B COMPLEX VITAMINS PO Oral Take 1 tablet by mouth daily.      Marland Kitchen CALCIUM CITRATE-VITAMIN D 315-200 MG-UNIT PO TABS Oral Take 1 tablet by mouth 2 (two) times daily.      Marland Kitchen  VITAMIN D 1000 UNITS PO TABS Oral Take 1,000 Units by mouth daily.      Marland Kitchen LEVOTHYROXINE SODIUM 75 MCG PO TABS  TAKE ONE TABLET BY MOUTH EVERY DAY 90 tablet 0  . LOSARTAN POTASSIUM 100 MG PO TABS Oral Take 1 tablet (100 mg total) by mouth daily. 90 tablet 0  . POLYETHYLENE GLYCOL 3350 PO POWD Oral Take by mouth daily as needed. As directed for constipation     . TRAMADOL-ACETAMINOPHEN 37.5-325 MG PO TABS Oral Take 1 tablet by mouth every 6 (six) hours as needed. As needed for pain      BP 148/66  Pulse 73  Temp 98 F (36.7 C)  Resp 16  SpO2 97%  Physical Exam  Constitutional: She is oriented to person, place, and time. She appears well-developed and well-nourished.  HENT:  Head: Normocephalic and atraumatic.  Mouth/Throat: Oropharynx is clear and moist.  Eyes:  Conjunctivae are normal. Pupils are equal, round, and reactive to light.  Neck: Normal range of motion. Neck supple. No JVD present.  Cardiovascular: Normal rate and regular rhythm.   Pulmonary/Chest: Effort normal and breath sounds normal. She has no wheezes. She has no rales.  Abdominal: Soft. Bowel sounds are normal. There is no tenderness. There is no rebound and no guarding.  Musculoskeletal: Normal range of motion. She exhibits no edema.  Lymphadenopathy:    She has no cervical adenopathy.  Neurological: She is alert and oriented to person, place, and time. She has normal reflexes. She exhibits normal muscle tone.       Intact L5/s1 intact lower extremity strength  Skin: Skin is warm and dry. She is not diaphoretic.  Psychiatric: Her behavior is normal. Thought content normal.    ED Course  Procedures (including critical care time)  Labs Reviewed  URINALYSIS, ROUTINE W REFLEX MICROSCOPIC - Abnormal; Notable for the following:    APPearance CLOUDY (*)     Specific Gravity, Urine 1.031 (*)     All other components within normal limits  CBC WITH DIFFERENTIAL - Abnormal; Notable for the following:    WBC 13.0 (*)     RBC 3.82 (*)     Hemoglobin 11.8 (*)     HCT 35.5 (*)     Neutro Abs 9.9 (*)     Lymphocytes Relative 11 (*)     Monocytes Absolute 1.3 (*)     All other components within normal limits  COMPREHENSIVE METABOLIC PANEL - Abnormal; Notable for the following:    Albumin 3.4 (*)     Alkaline Phosphatase 245 (*)     GFR calc non Af Amer 77 (*)     GFR calc Af Amer 90 (*)     All other components within normal limits  POCT I-STAT TROPONIN I   Dg Chest 2 View  10/10/2011  *RADIOLOGY REPORT*  Clinical Data: Nausea, back pain.  Weakness.  High blood pressure.  CHEST - 2 VIEW  Comparison: 09/15/2011  Findings: Heart is enlarged.  No focal consolidations or pleural effusions.  No overt edema.  Degenerative changes are seen in the spine.  There is a wedge compression fracture  of L3 which is chronic.  IMPRESSION:  1.  Cardiomegaly. 2. No focal pulmonary abnormality. 3.  Chronic wedge compression fracture of L3.  Original Report Authenticated By: Patterson Hammersmith, M.D.   Ct Head Wo Contrast  10/10/2011  *RADIOLOGY REPORT*  Clinical Data: Nausea, history of hypertension, diabetes.  History of breast cancer, mastectomy.  Recently  finished chemotherapy. History of endometrial cancer.  No known injury.  CT HEAD WITHOUT CONTRAST  Technique:  Contiguous axial images were obtained from the base of the skull through the vertex without contrast.  Comparison: There is central cortical atrophy.  Periventricular white matter changes are consistent with small vessel disease. There is no evidence for hemorrhage, mass lesion, or acute infarction.Bone windows show no acute abnormality.  Findings:  IMPRESSION:  1.  Atrophy and small vessel disease. 2. No evidence for acute intracranial abnormality.  Original Report Authenticated By: Patterson Hammersmith, M.D.     No diagnosis found.    MDM  In the setting of > 8 hours of nausea one negative ekg and troponin is sufficient to exclude ACS.  Will place in TCU for social work eval for placement Has chronic back pain and is scheduled for MRI this weekend no indications for MRI emergently as symptoms are unchanged and pt has FROM in all four extremities.  No fevers, no weakness no bowel or bladder incontinence   Date: 10/10/2011  Rate: 69  Rhythm: normal sinus rhythm  QRS Axis: normal  Intervals: normal  ST/T Wave abnormalities: normal  Conduction Disutrbances: none  Narrative Interpretation: unremarkable    Signed out to Dr. Manus Gunning pending social work evaluation   Paloma Grange Smitty Cords, MD 10/10/11 0325  Aliya Sol K Kynley Metzger-Rasch, MD 10/10/11 0326  Jasmine Awe, MD 10/10/11 [phone]

## 2011-10-10 NOTE — Progress Notes (Signed)
Provided pt with choices and advance home care contact information  HOME HEALTH AGENCIES SERVING [place]   Agencies that are Medicare-Certified and are affiliated with [facility] Home Health Agency  Telephone Number Address  [facility]   [facility] has ownership interest in this company; however, you are under no obligation to use this agency. [phone] or  [phone] [address] [place], [place] [address]   Agencies that are Medicare-Certified and are not affiliated with [facility] Telephone Number Address  [facility] [phone] Fax [phone] [address] [place], [place]  [address]  [facility] [phone] or [phone] Fax [phone] [address] [place], [place] [address]  [facility] [phone] Fax [phone] [address] [place], [place] [address]  [facility] [phone] Fax [phone] [address] [place], [place]  [address]  [facility] [phone] Fax [phone] [address], Suite [place], [place] [address]  [facility] [phone] [address] [place], [place] [address]  [facility] [phone]  [address] [place], [place] [address]  [facility] [phone] or [phone] Fax [phone] [address] [place], [place]  [address]  [facility] [phone] Fax [phone] [address] [place], [place]  [address]  [facility]  [phone] Fax [phone] [address] [place], [place] [address]

## 2011-10-10 NOTE — ED Notes (Signed)
Previous documentation done by Ulla Gallo, RN
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
## 2011-10-11 ENCOUNTER — Ambulatory Visit: Payer: Self-pay | Admitting: Orthopedic Surgery

## 2011-10-11 DIAGNOSIS — M5137 Other intervertebral disc degeneration, lumbosacral region: Secondary | ICD-10-CM | POA: Diagnosis not present

## 2011-10-11 DIAGNOSIS — M48061 Spinal stenosis, lumbar region without neurogenic claudication: Secondary | ICD-10-CM | POA: Diagnosis not present

## 2011-10-11 DIAGNOSIS — M4310 Spondylolisthesis, site unspecified: Secondary | ICD-10-CM | POA: Diagnosis not present

## 2011-10-11 DIAGNOSIS — M5126 Other intervertebral disc displacement, lumbar region: Secondary | ICD-10-CM | POA: Diagnosis not present

## 2011-10-11 NOTE — ED Provider Notes (Signed)
Patient stable with second arm and to go home to.  Patient will be discharged.  Nelia Shi, MD 10/11/11 [phone]

## 2011-10-11 NOTE — ED Notes (Signed)
Took patient to 1330 to visit her husband.

## 2011-12-08 ENCOUNTER — Other Ambulatory Visit: Payer: Self-pay | Admitting: Family Medicine

## 2011-12-08 NOTE — Telephone Encounter (Signed)
Please refil for 6 mo , thanks  

## 2011-12-08 NOTE — Telephone Encounter (Signed)
Electronic refill request.  No upcoming appt scheduled.

## 2011-12-20 ENCOUNTER — Other Ambulatory Visit: Payer: Self-pay | Admitting: Family Medicine

## 2011-12-24 DIAGNOSIS — Z23 Encounter for immunization: Secondary | ICD-10-CM | POA: Diagnosis not present

## 2011-12-25 ENCOUNTER — Telehealth: Payer: Self-pay | Admitting: *Deleted

## 2011-12-25 NOTE — Telephone Encounter (Signed)
Does she check bp at home? If so , what kind of bps is she getting?

## 2011-12-25 NOTE — Telephone Encounter (Signed)
Pt states she was told to d/c amlodipine to see if it was causing swelling in feet. Pt did d/c it and says swelling has stopped. She wants to know if she should restart med, she has full bottle of it. She says she is on 2 other bp meds and seems to being good on them. Pt is aware Dr is out of office today and she will be contacted tomorrow.

## 2011-12-25 NOTE — Telephone Encounter (Signed)
Advise pt to schedule a f/u appt before starting med and pt said she is going to talk to the aid that helps her with her husband and see when she will be avail. to stay with pt's husband before she can make a f/u appt, pt said will call back

## 2011-12-25 NOTE — Telephone Encounter (Signed)
I'm glad her swelling is better - we will need to schedule a f/u appt to see if bp is ok without it or if we will need to add or change something  F/u at her convenience (earlier if urinary symptoms persist)

## 2011-12-25 NOTE — Telephone Encounter (Signed)
Pt said she hasn't been checking her BP at home but she doesn't feel bad and her feet have stop swelling since she stopped the med.,  the only thing bothering her right now is she is having a lot of urinary frequency especially at night, I advise pt she should make an appt with you to rule out UTI but pt declined she said that since the frequency is her only sxs she will wait a few days to see if sxs stop before making an appt.

## 2011-12-26 ENCOUNTER — Encounter: Payer: Self-pay | Admitting: Family Medicine

## 2011-12-26 ENCOUNTER — Ambulatory Visit (INDEPENDENT_AMBULATORY_CARE_PROVIDER_SITE_OTHER): Payer: Medicare Other | Admitting: Family Medicine

## 2011-12-26 VITALS — BP 135/80 | HR 62 | Temp 98.7°F | Ht 60.5 in | Wt 148.2 lb

## 2011-12-26 DIAGNOSIS — R350 Frequency of micturition: Secondary | ICD-10-CM | POA: Diagnosis not present

## 2011-12-26 DIAGNOSIS — N3941 Urge incontinence: Secondary | ICD-10-CM

## 2011-12-26 DIAGNOSIS — K5900 Constipation, unspecified: Secondary | ICD-10-CM

## 2011-12-26 DIAGNOSIS — I10 Essential (primary) hypertension: Secondary | ICD-10-CM | POA: Diagnosis not present

## 2011-12-26 LAB — POCT URINALYSIS DIPSTICK
Glucose, UA: NEGATIVE
Nitrite, UA: NEGATIVE
Urobilinogen, UA: 0.2
pH, UA: 6

## 2011-12-26 NOTE — Progress Notes (Signed)
Subjective:    Patient ID: [name], female    DOB: [birth year], 78 y.o.   MRN: [id number]  HPI Is doing ok overall   Is getting around better but doesn't sleep well  Because she urinates a lot -especially at night  Has urge incontinence also No stress incontinence Just cannot get to the bathroom in time - thinks she has bladder spasms No dysuria No blood in urine   ua today is clear  Bought oxytrol patch otc and has not tried this  She worries about constipation  Takes metamucil , citrucel -not a lot of help  Has bm 2-3 times per week and it is hard to pass  bp is up a bit  today  No cp or palpitations or headaches or edema  No side effects to medicines  BP Readings from Last 3 Encounters:  12/26/11 146/78  10/11/11 130/60  09/15/11 130/68    Has been better without the amlodipine - no swelling - she wants to stay off of this  bp at home has been ok   Patient Active Problem List  Diagnosis  . NEOPLASM, MALIGNANT, BREAST  . PLUMMER'S DISEASE  . HYPERLIPIDEMIA  . ANXIETY  . STRESS REACTION, ACUTE, WITH EMOTIONAL DISTURBANCE  . HYPERTENSION  . BACK PAIN  . LEG PAIN, BILATERAL  . OSTEOPENIA  . HYPERGLYCEMIA  . HYPOTHYROIDISM  . CONSTIPATION  . Chest wall pain  . Special screening for malignant neoplasms, colon  . Chest pain  . Hip pain, bilateral  . Urge incontinence   Past Medical History  Diagnosis Date  . Anxiety 12/2002  . Hyperlipidemia 11/2004  . Hypertension 12/2002  . Spinal stenosis   . Osteopenia 02/2002    02/2002 Dexa osteopenia// Dexa 02/26/2004 (Dr. Nicholas Lose) stable/IMR femur/spine  . Diabetes mellitus 11/2004    Type 2  . 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 Patrecia Pace endocrinologist  . Cancer     left breast cancer   Past Surgical History  Procedure Date  . Abdominal hysterectomy 1990    endometrial adenoca  . Breast surgery 1980    left breast lumpectomy //abscess of breast evac.  Marland Kitchen Cholecystectomy  07/10/1987  . Eye surgery     Tear Duct surgery   History  Substance Use Topics  . Smoking status: Never Smoker   . Smokeless tobacco: Not on file  . Alcohol Use: No   Family History  Problem Relation Age of Onset  . Alzheimer's disease Mother   . Heart disease Mother 5    angina  . Hypertension Mother   . Crohn's disease Mother     ? crohns disease  . Cancer Father 39    colon cancer  . Cancer Sister 42    breast cancer   . COPD Sister     emphysema (smoker)  . Heart disease Sister     angina  . Alcohol abuse Brother   . Diabetes Paternal Aunt   . Stroke Neg Hx   . Hypertension Sister    Allergies  Allergen Reactions  . Amlodipine     Leg edema   . Atenolol     REACTION: bradycardia  . Erythromycin Base     REACTION: stomach upset  . Lisinopril     REACTION: cough  (20 mg)   Current Outpatient Prescriptions on File Prior to Visit  Medication Sig Dispense Refill  . aspirin 81 MG tablet Take 81 mg by mouth daily.  Daily with food       . B COMPLEX VITAMINS PO Take 1 tablet by mouth daily.        . calcium citrate-vitamin D (CITRACAL+D) 315-200 MG-UNIT per tablet Take 1 tablet by mouth 2 (two) times daily.        . cholecalciferol (VITAMIN D) 1000 UNITS tablet Take 1,000 Units by mouth daily.        Marland Kitchen levothyroxine (SYNTHROID, LEVOTHROID) 75 MCG tablet TAKE ONE TABLET BY MOUTH EVERY DAY  90 tablet  1  . losartan (COZAAR) 100 MG tablet TAKE ONE TABLET BY MOUTH EVERY DAY  90 tablet  0  . polyethylene glycol powder (GLYCOLAX/MIRALAX) powder Take by mouth daily as needed. As directed for constipation       . traMADol-acetaminophen (ULTRACET) 37.5-325 MG per tablet Take 1 tablet by mouth every 6 (six) hours as needed. As needed for pain                Review of Systems Review of Systems  Constitutional: Negative for fever, appetite change,  and unexpected weight change.  Eyes: Negative for pain and visual disturbance.  Respiratory: Negative for cough and  shortness of breath.   Cardiovascular: Negative for cp or palpitations   leg edema is better  Gastrointestinal: Negative for nausea, diarrhea and pos for constipation, neg for abd pain or blood in stool or dark stool Genitourinary: Negative for dysuria/ flank pain/ blood in urine Skin: Negative for pallor or rash   Neurological: Negative for weakness, light-headedness, numbness and headaches.  Hematological: Negative for adenopathy. Does not bruise/bleed easily.  Psychiatric/Behavioral: Negative for dysphoric mood. The patient is not nervous/anxious.         Objective:   Physical Exam  Constitutional: She appears well-developed and well-nourished. No distress.  HENT:  Head: Normocephalic and atraumatic.  Mouth/Throat: Oropharynx is clear and moist.  Eyes: Conjunctivae normal and EOM are normal. Pupils are equal, round, and reactive to light. No scleral icterus.  Neck: Normal range of motion. Neck supple. No JVD present. Carotid bruit is not present. No thyromegaly present.  Cardiovascular: Normal rate, regular rhythm, normal heart sounds and intact distal pulses.  Exam reveals no gallop.   Pulmonary/Chest: Effort normal and breath sounds normal. No respiratory distress. She has no wheezes.  Abdominal: Soft. Bowel sounds are normal. She exhibits no distension, no abdominal bruit and no mass. There is no tenderness. There is no rebound and no guarding.       No suprapubic tenderness or fullness    Musculoskeletal: She exhibits no edema.       No cva tenderness   Lymphadenopathy:    She has no cervical adenopathy.  Neurological: She is alert. She has normal reflexes. No cranial nerve deficit. She exhibits normal muscle tone. Coordination normal.  Skin: Skin is warm and dry. No rash noted. No erythema. No pallor.  Psychiatric: She has a normal mood and affect.          Assessment & Plan:

## 2011-12-26 NOTE — Patient Instructions (Addendum)
Try the over the counter oxytrol patch for overactive bladder and urge incontinence  If this does not help let me know  It can cause some sleepiness/dry mouth and constipation For constipation- take stool softener twice daily and miralax daily as needed  Blood pressure is ok- stay off the amlodipine  We will continue to watch this

## 2011-12-26 NOTE — Assessment & Plan Note (Signed)
bp up a bit off amlodipine but ok for now  Will continue to follow

## 2011-12-28 NOTE — Assessment & Plan Note (Signed)
ua neg today  Also overactive bladder with spasms, that keeps her up at night  Rev opt for tx She will try the oxytrol patch first otc (rev poss side eff)- and consider px tx if not helpful

## 2011-12-28 NOTE — Assessment & Plan Note (Signed)
Adv pt to stop the fiber supplements Instead , inc water intake and take stool softener like colace bid Then miralax daily as needed and update if not imp

## 2012-02-09 ENCOUNTER — Ambulatory Visit: Payer: Self-pay | Admitting: Internal Medicine

## 2012-02-09 DIAGNOSIS — Z1231 Encounter for screening mammogram for malignant neoplasm of breast: Secondary | ICD-10-CM | POA: Diagnosis not present

## 2012-02-09 DIAGNOSIS — Z853 Personal history of malignant neoplasm of breast: Secondary | ICD-10-CM | POA: Diagnosis not present

## 2012-02-09 DIAGNOSIS — Z9010 Acquired absence of unspecified breast and nipple: Secondary | ICD-10-CM | POA: Diagnosis not present

## 2012-04-06 ENCOUNTER — Other Ambulatory Visit: Payer: Self-pay | Admitting: Family Medicine

## 2012-05-05 DIAGNOSIS — H2589 Other age-related cataract: Secondary | ICD-10-CM | POA: Diagnosis not present

## 2012-05-20 ENCOUNTER — Emergency Department: Payer: Self-pay | Admitting: Emergency Medicine

## 2012-05-20 ENCOUNTER — Telehealth: Payer: Self-pay | Admitting: Family Medicine

## 2012-05-20 DIAGNOSIS — Z7982 Long term (current) use of aspirin: Secondary | ICD-10-CM | POA: Diagnosis not present

## 2012-05-20 DIAGNOSIS — Z85850 Personal history of malignant neoplasm of thyroid: Secondary | ICD-10-CM | POA: Diagnosis not present

## 2012-05-20 DIAGNOSIS — R42 Dizziness and giddiness: Secondary | ICD-10-CM | POA: Diagnosis not present

## 2012-05-20 DIAGNOSIS — Z79899 Other long term (current) drug therapy: Secondary | ICD-10-CM | POA: Diagnosis not present

## 2012-05-20 DIAGNOSIS — Z9079 Acquired absence of other genital organ(s): Secondary | ICD-10-CM | POA: Diagnosis not present

## 2012-05-20 DIAGNOSIS — Z853 Personal history of malignant neoplasm of breast: Secondary | ICD-10-CM | POA: Diagnosis not present

## 2012-05-20 DIAGNOSIS — I10 Essential (primary) hypertension: Secondary | ICD-10-CM | POA: Diagnosis not present

## 2012-05-20 LAB — COMPREHENSIVE METABOLIC PANEL
Albumin: 3.4 g/dL (ref 3.4–5.0)
Anion Gap: 5 — ABNORMAL LOW (ref 7–16)
Bilirubin,Total: 0.3 mg/dL (ref 0.2–1.0)
Calcium, Total: 8.4 mg/dL — ABNORMAL LOW (ref 8.5–10.1)
Creatinine: 0.78 mg/dL (ref 0.60–1.30)
EGFR (Non-African Amer.): >60
Potassium: 4.1 mmol/L (ref 3.5–5.1)
Sodium: 141 mmol/L (ref 136–145)
Total Protein: 7 g/dL (ref 6.4–8.2)

## 2012-05-20 LAB — URINALYSIS, COMPLETE
Blood: NEGATIVE
Glucose,UR: NEGATIVE mg/dL (ref 0–75)
Leukocyte Esterase: NEGATIVE
Nitrite: NEGATIVE
Ph: 6 (ref 4.5–8.0)
RBC,UR: 1 /HPF (ref 0–5)
Specific Gravity: 1.017 (ref 1.003–1.030)

## 2012-05-20 LAB — CBC
HCT: 38.8 % (ref 35.0–47.0)
HGB: 12.5 g/dL (ref 12.0–16.0)
MCV: 94 fL (ref 80–100)
RDW: 14.2 % (ref 11.5–14.5)

## 2012-05-20 LAB — CK TOTAL AND CKMB (NOT AT ARMC): CK-MB: 6.6 ng/mL — ABNORMAL HIGH (ref 0.5–3.6)

## 2012-05-20 NOTE — Telephone Encounter (Signed)
Patient Information:  Caller Name: Darel Hong  Phone: [phone]  Patient: [name]  Gender: Female  DOB: 1926-07-22  Age: 79 Years  PCP: [facility])  Office Follow Up:  Does the office need to follow up with this patient?: No  Instructions For The Office: N/A   Symptoms  Reason For Call & Symptoms: Caregiver calling, patient is having dizziness, b/p 200/92, weakness and paleness with shortness of breath.  Denies any chest pain.  Also has increased swelling in her feet and ankles.  Having some shortness of breath even just sitting in the chair.  Reviewed Health History In EMR: Yes  Reviewed Medications In EMR: Yes  Reviewed Allergies In EMR: Yes  Reviewed Surgeries / Procedures: Yes  Date of Onset of Symptoms: 05/19/2012  Guideline(s) Used:  Leg Swelling and Edema  Disposition Per Guideline:   Go to ED Now  Reason For Disposition Reached:   Difficulty breathing at rest  Advice Given:  N/A

## 2012-05-20 NOTE — Telephone Encounter (Signed)
Please check on her and make sure she went to the hospital- no [facility] notes in epic so may have gone to [facility]

## 2012-05-21 ENCOUNTER — Encounter: Payer: Self-pay | Admitting: Family Medicine

## 2012-05-21 ENCOUNTER — Ambulatory Visit (INDEPENDENT_AMBULATORY_CARE_PROVIDER_SITE_OTHER): Payer: Medicare Other | Admitting: Family Medicine

## 2012-05-21 VITALS — BP 114/82 | HR 64 | Temp 98.9°F | Ht 60.5 in

## 2012-05-21 DIAGNOSIS — R42 Dizziness and giddiness: Secondary | ICD-10-CM | POA: Diagnosis not present

## 2012-05-21 DIAGNOSIS — I10 Essential (primary) hypertension: Secondary | ICD-10-CM | POA: Diagnosis not present

## 2012-05-21 DIAGNOSIS — Z9181 History of falling: Secondary | ICD-10-CM | POA: Diagnosis not present

## 2012-05-21 NOTE — Telephone Encounter (Signed)
Pt was seen an has f/u appt with Dr. Milinda Antis today

## 2012-05-21 NOTE — Patient Instructions (Addendum)
Lets watch your blood pressure - get a cuff over the counter OMRON brand for the arm and watch it (keep me updated) We will refer you to neurologist for head pain and dizziness and falls  For anxiety - I feel you would benefit from counseling - please discuss this with your family (I think medicine for anxiety will increase fall risk) Blood pressure is excellent today! Use your walker instead of cane - to prevent falls (the cane is not good enough)
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
## 2012-05-23 NOTE — Assessment & Plan Note (Signed)
bp is back to normal - apprehensive to adv therapy at this time- family will purchase an OMRON cuff and begin to monitor closely at home  Rev symptoms to watch for  Hx of dizziness is concerning-did ref to neuro for that  Disc fall risk  Anxiety may have certainly played a role in recent bp problem

## 2012-05-23 NOTE — Assessment & Plan Note (Signed)
Episodic and not positional or seemingly orthostatic Fall risk is high and pt will use walker at all times  Ref to neuro for further eval
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
## 2012-05-23 NOTE — Assessment & Plan Note (Signed)
See assessment Ref to neuro and may also benefit from PT  Family is attentive to her safety and will make sure she uses walker at all times

## 2012-06-03 ENCOUNTER — Encounter: Payer: Self-pay | Admitting: Family Medicine

## 2012-06-15 DIAGNOSIS — E119 Type 2 diabetes mellitus without complications: Secondary | ICD-10-CM | POA: Diagnosis not present

## 2012-06-15 DIAGNOSIS — E785 Hyperlipidemia, unspecified: Secondary | ICD-10-CM | POA: Diagnosis not present

## 2012-06-15 DIAGNOSIS — I10 Essential (primary) hypertension: Secondary | ICD-10-CM | POA: Diagnosis not present

## 2012-06-15 DIAGNOSIS — R51 Headache: Secondary | ICD-10-CM | POA: Diagnosis not present

## 2012-06-18 ENCOUNTER — Encounter: Payer: Self-pay | Admitting: Family Medicine

## 2012-06-18 ENCOUNTER — Ambulatory Visit (INDEPENDENT_AMBULATORY_CARE_PROVIDER_SITE_OTHER): Payer: Medicare Other | Admitting: Family Medicine

## 2012-06-18 VITALS — BP 140/62 | HR 68 | Temp 98.4°F | Ht 60.5 in

## 2012-06-18 DIAGNOSIS — R42 Dizziness and giddiness: Secondary | ICD-10-CM

## 2012-06-18 DIAGNOSIS — I10 Essential (primary) hypertension: Secondary | ICD-10-CM

## 2012-06-18 MED ORDER — HYDROCHLOROTHIAZIDE 12.5 MG PO CAPS: 12.5000 mg | ORAL_CAPSULE | Freq: Every day | ORAL | Status: DC

## 2012-06-18 NOTE — Assessment & Plan Note (Signed)
This improved and now worse again with the start of gabapentin (for facial pain ) from neuro Has only been on it 3 d - it will probably improve but I asked her to call the neuro office and let me know regardless Again - stress is probably playing a role

## 2012-06-18 NOTE — Assessment & Plan Note (Signed)
bp fair here but high at home with the nurse and high at neuro office (per pt )  Will get bp cuff when she can afford it Intol to many meds Add hctz 12.5 daily in am  F/u 4-5 wk for visit and labs

## 2012-06-18 NOTE — Patient Instructions (Addendum)
When you can afford it - I want you to get an OMRON cuff (for the arm) - size regular , and check blood pressure when you are relaxed  Call Dr Daisy Blossom office and tell them the gabapentin is making you dizzy Start hctz 12.5 mg for blood pressure- one pill each am Follow up with me in 3-5 weeks for blood pressure re check and labs

## 2012-06-18 NOTE — Progress Notes (Signed)
Subjective:    Patient ID: [name], female    DOB: [birth year], 79 y.o.   MRN: [id number]  HPI Here for f/u of HTN   Has had the hospice nurse (who sees her husband) - check it  Has gone up several times at home  168/98 -around that when they check it  No headache or edema No side effects from present med but has been intol to amlodipine and atenolol and lisinopril in the past    Is on gabapentin - for pains in her head - ? If that medicine is making her more dizzy She said "he thought most of her pain was caused by high blood pressure" From neuro Dr Malvin Johns Had a visit - her glucose was ?low - will get labs   Ringing in ear got better   Patient Active Problem List  Diagnosis  . NEOPLASM, MALIGNANT, BREAST  . PLUMMER'S DISEASE  . HYPERLIPIDEMIA  . ANXIETY  . STRESS REACTION, ACUTE, WITH EMOTIONAL DISTURBANCE  . HYPERTENSION  . BACK PAIN  . LEG PAIN, BILATERAL  . OSTEOPENIA  . HYPERGLYCEMIA  . HYPOTHYROIDISM  . CONSTIPATION  . Chest wall pain  . Special screening for malignant neoplasms, colon  . Chest pain  . Hip pain, bilateral  . Urge incontinence  . Dizziness and giddiness  . Falls frequently   Past Medical History  Diagnosis Date  . Anxiety 12/2002  . Hyperlipidemia 11/2004  . Hypertension 12/2002  . Spinal stenosis   . Osteopenia 02/2002    02/2002 Dexa osteopenia// Dexa 02/26/2004 (Dr. Nicholas Lose) stable/IMR femur/spine  . Diabetes mellitus 11/2004    Type 2  . 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 Patrecia Pace endocrinologist  . Cancer     left breast cancer   Past Surgical History  Procedure Laterality Date  . Abdominal hysterectomy  1990    endometrial adenoca  . Breast surgery  1980    left breast lumpectomy //abscess of breast evac.  Marland Kitchen Cholecystectomy  07/10/1987  . Eye surgery      Tear Duct surgery   History  Substance Use Topics  . Smoking status: Never Smoker   . Smokeless tobacco: Not on file  .  Alcohol Use: No   Family History  Problem Relation Age of Onset  . Alzheimer's disease Mother   . Heart disease Mother 100    angina  . Hypertension Mother   . Crohn's disease Mother     ? crohns disease  . Cancer Father 31    colon cancer  . Cancer Sister 38    breast cancer   . COPD Sister     emphysema (smoker)  . Heart disease Sister     angina  . Alcohol abuse Brother   . Diabetes Paternal Aunt   . Stroke Neg Hx   . Hypertension Sister    Allergies  Allergen Reactions  . Amlodipine     Leg edema   . Atenolol     REACTION: bradycardia  . Erythromycin Base     REACTION: stomach upset  . Lisinopril     REACTION: cough  (20 mg)   Current Outpatient Prescriptions on File Prior to Visit  Medication Sig Dispense Refill  . aspirin 81 MG tablet Take 81 mg by mouth daily. Daily with food       . B COMPLEX VITAMINS PO Take 1 tablet by mouth daily.        . calcium  citrate-vitamin D (CITRACAL+D) 315-200 MG-UNIT per tablet Take 1 tablet by mouth 2 (two) times daily.        . cholecalciferol (VITAMIN D) 1000 UNITS tablet Take 1,000 Units by mouth daily.        Marland Kitchen levothyroxine (SYNTHROID, LEVOTHROID) 75 MCG tablet TAKE ONE TABLET BY MOUTH EVERY DAY  90 tablet  1  . losartan (COZAAR) 100 MG tablet TAKE ONE TABLET BY MOUTH EVERY DAY  90 tablet  0  . polyethylene glycol powder (GLYCOLAX/MIRALAX) powder Take by mouth daily as needed. As directed for constipation       . traMADol-acetaminophen (ULTRACET) 37.5-325 MG per tablet Take 1 tablet by mouth every 6 (six) hours as needed. As needed for pain       No current facility-administered medications on file prior to visit.      Review of Systems Review of Systems  Constitutional: Negative for fever, appetite change, fatigue and unexpected weight change.  Eyes: Negative for pain and visual disturbance.  Respiratory: Negative for cough and shortness of breath.   Cardiovascular: Negative for cp or palpitations    Gastrointestinal:  Negative for nausea, diarrhea and constipation.  Genitourinary: Negative for urgency and frequency.  Skin: Negative for pallor or rash   Neurological: Negative for weakness, , numbness and headaches.  Hematological: Negative for adenopathy. Does not bruise/bleed easily.  Psychiatric/Behavioral: Negative for dysphoric mood. The patient is not nervous/anxious.         Objective:   Physical Exam  Constitutional: She appears well-developed and well-nourished. No distress.  Well appearing, in wheelchair  HENT:  Head: Normocephalic and atraumatic.  Mouth/Throat: Oropharynx is clear and moist.  Eyes: Conjunctivae and EOM are normal. Pupils are equal, round, and reactive to light. Right eye exhibits no discharge. Left eye exhibits no discharge. No scleral icterus.  No nystagmus  Neck: Normal range of motion. Neck supple. No JVD present. Carotid bruit is not present. No thyromegaly present.  Cardiovascular: Normal rate, regular rhythm, normal heart sounds and intact distal pulses.  Exam reveals no gallop.   Pulmonary/Chest: Effort normal and breath sounds normal. No respiratory distress. She has no wheezes. She has no rales.  No crackles   Abdominal: Soft. Bowel sounds are normal. She exhibits no distension, no abdominal bruit and no mass. There is no tenderness.  Musculoskeletal: She exhibits no edema.  Lymphadenopathy:    She has no cervical adenopathy.  Neurological: She is alert. She has normal reflexes. No cranial nerve deficit. She exhibits normal muscle tone. Coordination normal.  Skin: Skin is warm and dry. No rash noted. No erythema. No pallor.  Psychiatric: She has a normal mood and affect.          Assessment & Plan:

## 2012-06-28 ENCOUNTER — Other Ambulatory Visit: Payer: Self-pay | Admitting: Family Medicine

## 2012-06-28 ENCOUNTER — Encounter: Payer: Self-pay | Admitting: *Deleted

## 2012-06-30 DIAGNOSIS — M545 Low back pain: Secondary | ICD-10-CM | POA: Diagnosis not present

## 2012-06-30 DIAGNOSIS — R51 Headache: Secondary | ICD-10-CM | POA: Diagnosis not present

## 2012-06-30 DIAGNOSIS — E119 Type 2 diabetes mellitus without complications: Secondary | ICD-10-CM | POA: Diagnosis not present

## 2012-06-30 DIAGNOSIS — E785 Hyperlipidemia, unspecified: Secondary | ICD-10-CM | POA: Diagnosis not present

## 2012-07-05 ENCOUNTER — Other Ambulatory Visit: Payer: Self-pay | Admitting: Family Medicine

## 2012-07-21 DIAGNOSIS — H353190 Nonexudative age-related macular degeneration, unspecified eye, stage unspecified: Secondary | ICD-10-CM | POA: Diagnosis not present

## 2012-07-23 ENCOUNTER — Encounter: Payer: Self-pay | Admitting: Family Medicine

## 2012-07-23 ENCOUNTER — Ambulatory Visit (INDEPENDENT_AMBULATORY_CARE_PROVIDER_SITE_OTHER): Payer: Medicare Other | Admitting: Family Medicine

## 2012-07-23 VITALS — BP 132/65 | HR 65 | Temp 98.3°F | Ht 60.5 in | Wt 134.8 lb

## 2012-07-23 DIAGNOSIS — I10 Essential (primary) hypertension: Secondary | ICD-10-CM | POA: Diagnosis not present

## 2012-07-23 LAB — BASIC METABOLIC PANEL
BUN: 28 mg/dL — ABNORMAL HIGH (ref 6–23)
Calcium: 9.1 mg/dL (ref 8.4–10.5)
Creatinine, Ser: 1 mg/dL (ref 0.4–1.2)
GFR: 55.55 mL/min — ABNORMAL LOW (ref >60.00)
Potassium: 3.9 mEq/L (ref 3.5–5.1)

## 2012-07-23 NOTE — Assessment & Plan Note (Signed)
Stable/ improved outside of office with hctz As also lost wt and happy with that Lab today  Will update if problems and f/u 6 mo

## 2012-07-23 NOTE — Patient Instructions (Addendum)
I'm glad your blood pressure is doing better and your pain is also better  Lab today for the fluid pill  Eat a healthy diet and stay as active as you can  Follow up with me in about 6 months unless needed earlier

## 2012-07-23 NOTE — Progress Notes (Signed)
Subjective:    Patient ID: [name], female    DOB: 07-Oct-1926, 79 y.o.   MRN: [id number]  HPI Here for f/u of HTN  She is overall feeling a lot better   The gabapentin is really helping her symptoms of facial pain  -- is used to it (not dizzy like she was when she started it) She is happy about that   Added hctz last time 12.5 mg- and bp better at neuro office  No problems with that at all    No cp or palpitations or headaches or edema  No side effects to medicines  BP Readings from Last 3 Encounters:  07/23/12 146/64  06/18/12 140/62  05/21/12 114/82     Re check 142/65  Patient Active Problem List   Diagnosis Date Noted  . Dizziness and giddiness 05/21/2012  . Falls frequently 05/21/2012  . Urge incontinence 12/26/2011  . Chest pain 09/15/2011  . Hip pain, bilateral 09/15/2011  . Special screening for malignant neoplasms, colon 07/10/2010  . Chest wall pain 07/08/2010  . HYPOTHYROIDISM 04/30/2010  . CONSTIPATION 04/30/2010  . STRESS REACTION, ACUTE, WITH EMOTIONAL DISTURBANCE 10/09/2009  . HYPERGLYCEMIA 07/22/2009  . NEOPLASM, MALIGNANT, BREAST 12/29/2007  . LEG PAIN, BILATERAL 07/01/2007  . BACK PAIN 06/22/2006  . PLUMMER'S DISEASE 06/16/2006  . HYPERLIPIDEMIA 06/16/2006  . ANXIETY 06/16/2006  . HYPERTENSION 06/16/2006  . OSTEOPENIA 06/16/2006   Past Medical History  Diagnosis Date  . Anxiety 12/2002  . Hyperlipidemia 11/2004  . Hypertension 12/2002  . Spinal stenosis   . Osteopenia 02/2002    02/2002 Dexa osteopenia// Dexa 02/26/2004 (Dr. Nicholas Lose) stable/IMR femur/spine  . Diabetes mellitus 11/2004    Type 2  . 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 Patrecia Pace endocrinologist  . Cancer     left breast cancer   Past Surgical History  Procedure Laterality Date  . Abdominal hysterectomy  1990    endometrial adenoca  . Breast surgery  1980    left breast lumpectomy //abscess of breast evac.  Marland Kitchen Cholecystectomy   07/10/1987  . Eye surgery      Tear Duct surgery   History  Substance Use Topics  . Smoking status: Never Smoker   . Smokeless tobacco: Not on file  . Alcohol Use: No   Family History  Problem Relation Age of Onset  . Alzheimer's disease Mother   . Heart disease Mother 44    angina  . Hypertension Mother   . Crohn's disease Mother     ? crohns disease  . Cancer Father 36    colon cancer  . Cancer Sister 86    breast cancer   . COPD Sister     emphysema (smoker)  . Heart disease Sister     angina  . Alcohol abuse Brother   . Diabetes Paternal Aunt   . Stroke Neg Hx   . Hypertension Sister    Allergies  Allergen Reactions  . Amlodipine     Leg edema   . Atenolol     REACTION: bradycardia  . Erythromycin Base     REACTION: stomach upset  . Lisinopril     REACTION: cough  (20 mg)   Current Outpatient Prescriptions on File Prior to Visit  Medication Sig Dispense Refill  . aspirin 81 MG tablet Take 81 mg by mouth daily. Daily with food       . B COMPLEX VITAMINS PO Take 1 tablet by mouth daily.        Marland Kitchen  cholecalciferol (VITAMIN D) 1000 UNITS tablet Take 1,000 Units by mouth daily.        Marland Kitchen gabapentin (NEURONTIN) 100 MG capsule Take 100 mg by mouth 2 (two) times daily.      . hydrochlorothiazide (MICROZIDE) 12.5 MG capsule Take 1 capsule (12.5 mg total) by mouth daily.  30 capsule  11  . levothyroxine (SYNTHROID, LEVOTHROID) 75 MCG tablet TAKE ONE TABLET BY MOUTH EVERY DAY  90 tablet  0  . losartan (COZAAR) 100 MG tablet TAKE ONE TABLET BY MOUTH EVERY DAY  90 tablet  0  . polyethylene glycol powder (GLYCOLAX/MIRALAX) powder Take by mouth daily as needed. As directed for constipation        No current facility-administered medications on file prior to visit.     Review of Systems Review of Systems  Constitutional: Negative for fever, appetite change, fatigue and unexpected weight change.  Eyes: Negative for pain and visual disturbance.  Respiratory: Negative for  cough and shortness of breath.   Cardiovascular: Negative for cp or palpitations    Gastrointestinal: Negative for nausea, diarrhea and constipation.  Genitourinary: Negative for urgency and frequency.  Skin: Negative for pallor or rash   Neurological: Negative for weakness, light-headedness, numbness and headaches. pos for facial pain that is improved  Hematological: Negative for adenopathy. Does not bruise/bleed easily.  Psychiatric/Behavioral: Negative for dysphoric mood. The patient is not nervous/anxious.         Objective:   Physical Exam  Constitutional: She appears well-developed and well-nourished. No distress.  HENT:  Head: Normocephalic and atraumatic.  Mouth/Throat: Oropharynx is clear and moist.  Eyes: Conjunctivae and EOM are normal. Pupils are equal, round, and reactive to light. Right eye exhibits no discharge. Left eye exhibits no discharge. No scleral icterus.  Neck: Normal range of motion. Neck supple. No JVD present. Carotid bruit is not present. No thyromegaly present.  Cardiovascular: Normal rate, regular rhythm, normal heart sounds and intact distal pulses.  Exam reveals no gallop.   Pulmonary/Chest: Effort normal and breath sounds normal. No respiratory distress. She has no wheezes. She has no rales.  Abdominal: Soft. Bowel sounds are normal. She exhibits no abdominal bruit.  Musculoskeletal: She exhibits no edema and no tenderness.  Lymphadenopathy:    She has no cervical adenopathy.  Neurological: She is alert. She has normal reflexes. No cranial nerve deficit. She exhibits normal muscle tone. Coordination normal.  Skin: Skin is warm and dry. No rash noted. No erythema. No pallor.  Psychiatric: She has a normal mood and affect.          Assessment & Plan:

## 2012-07-26 ENCOUNTER — Encounter: Payer: Self-pay | Admitting: *Deleted

## 2012-10-09 ENCOUNTER — Other Ambulatory Visit: Payer: Self-pay | Admitting: Family Medicine

## 2012-10-11 ENCOUNTER — Telehealth: Payer: Self-pay

## 2012-10-11 NOTE — Telephone Encounter (Signed)
I assume this is the generic -- if so she may need to check with some different pharmacies---- [facility] tend to have lower prices  ? Wonder about the [employer] 5$ program?  Let me know what she finds out  If she is on name brand synthroid then we will need to change to generic

## 2012-10-11 NOTE — Telephone Encounter (Signed)
Pt's is already on generic levothyroxine so I advised pt's caregiver to check with other pharmacies, including [facility] to see if it's on the $5 list, Darel Hong will call [facility] and call us back to let us know if we need to send in the Rx to another pharmacy

## 2012-10-11 NOTE — Telephone Encounter (Signed)
[name] pts caregiver said pts levothyroxine cost has increased from $ 15 to $29; pt has said she cannot afford med and will stop med if cannot get cheaper substitute. Darel Hong request cb. [employer].Please advise.

## 2012-10-12 MED ORDER — LEVOTHYROXINE SODIUM 75 MCG PO TABS: ORAL_TABLET | ORAL | Status: DC

## 2012-10-12 NOTE — Telephone Encounter (Signed)
Rx sent to [facility] and Darel Hong (caregiver) notified

## 2012-10-12 NOTE — Telephone Encounter (Signed)
[name] left v/m requesting cb to see if med called to [facility].

## 2012-12-27 ENCOUNTER — Telehealth: Payer: Self-pay | Admitting: Family Medicine

## 2012-12-27 NOTE — Telephone Encounter (Signed)
Form placed in your inbox

## 2012-12-27 NOTE — Telephone Encounter (Signed)
Someone dropped off a dmv form to be filled out on [employer].

## 2012-12-27 NOTE — Telephone Encounter (Signed)
Done and in IN box thanks 

## 2012-12-29 DIAGNOSIS — Z0279 Encounter for issue of other medical certificate: Secondary | ICD-10-CM

## 2012-12-29 NOTE — Telephone Encounter (Signed)
Darel Hong notified form ready for pick-up and advise of $20 fee

## 2013-04-11 ENCOUNTER — Telehealth: Payer: Self-pay | Admitting: *Deleted

## 2013-04-11 ENCOUNTER — Ambulatory Visit: Payer: Self-pay | Admitting: Family Medicine

## 2013-04-11 DIAGNOSIS — Z853 Personal history of malignant neoplasm of breast: Secondary | ICD-10-CM | POA: Diagnosis not present

## 2013-04-11 DIAGNOSIS — C50919 Malignant neoplasm of unspecified site of unspecified female breast: Secondary | ICD-10-CM

## 2013-04-11 DIAGNOSIS — Z1231 Encounter for screening mammogram for malignant neoplasm of breast: Secondary | ICD-10-CM

## 2013-04-11 NOTE — Telephone Encounter (Signed)
Need order for uni screen right with h/o breast cancer left mastectomy faxed to [phone]

## 2013-04-11 NOTE — Telephone Encounter (Signed)
They mean mammogram?  Correct?

## 2013-04-12 ENCOUNTER — Encounter: Payer: Self-pay | Admitting: Family Medicine

## 2013-04-12 DIAGNOSIS — Z1231 Encounter for screening mammogram for malignant neoplasm of breast: Secondary | ICD-10-CM | POA: Insufficient documentation

## 2013-04-12 NOTE — Telephone Encounter (Signed)
I put the order in epic- you or Rosaria Ferries will have to print it out to fax - I do not know how  Thanks

## 2013-04-12 NOTE — Telephone Encounter (Signed)
Mammogram order printed and placed in Dr. Marliss Coots in box for signature.

## 2013-04-12 NOTE — Telephone Encounter (Signed)
yes

## 2013-04-13 NOTE — Telephone Encounter (Signed)
Done and in IN box 

## 2013-04-13 NOTE — Telephone Encounter (Signed)
Order faxed.

## 2013-05-24 ENCOUNTER — Ambulatory Visit: Payer: Self-pay | Admitting: Family Medicine

## 2013-05-24 DIAGNOSIS — R922 Inconclusive mammogram: Secondary | ICD-10-CM | POA: Diagnosis not present

## 2013-05-24 DIAGNOSIS — N6459 Other signs and symptoms in breast: Secondary | ICD-10-CM | POA: Diagnosis not present

## 2013-05-25 ENCOUNTER — Encounter: Payer: Self-pay | Admitting: Family Medicine

## 2013-05-31 ENCOUNTER — Telehealth: Payer: Self-pay | Admitting: Family Medicine

## 2013-05-31 NOTE — Telephone Encounter (Signed)
Yes, please, thanks.  59min visit.  Try to get old records ahead of time.

## 2013-05-31 NOTE — Telephone Encounter (Signed)
Pt left vm on triage phone requesting cb from Dr. Damita Dunnings about seeing her son as a new pt.

## 2013-05-31 NOTE — Telephone Encounter (Signed)
Spoke with [name] and asked [name] to call her to set up NP appt.

## 2013-07-05 ENCOUNTER — Other Ambulatory Visit: Payer: Self-pay | Admitting: *Deleted

## 2013-07-05 MED ORDER — HYDROCHLOROTHIAZIDE 12.5 MG PO CAPS: 12.5000 mg | ORAL_CAPSULE | Freq: Every day | ORAL | Status: DC

## 2013-09-02 ENCOUNTER — Encounter: Payer: Self-pay | Admitting: Internal Medicine

## 2013-09-02 ENCOUNTER — Ambulatory Visit (INDEPENDENT_AMBULATORY_CARE_PROVIDER_SITE_OTHER): Payer: Medicare Other | Admitting: Internal Medicine

## 2013-09-02 VITALS — BP 162/82 | HR 77 | Temp 98.2°F | Wt 151.8 lb

## 2013-09-02 DIAGNOSIS — M129 Arthropathy, unspecified: Secondary | ICD-10-CM | POA: Diagnosis not present

## 2013-09-02 DIAGNOSIS — E11311 Type 2 diabetes mellitus with unspecified diabetic retinopathy with macular edema: Secondary | ICD-10-CM | POA: Insufficient documentation

## 2013-09-02 DIAGNOSIS — E039 Hypothyroidism, unspecified: Secondary | ICD-10-CM

## 2013-09-02 DIAGNOSIS — Z9181 History of falling: Secondary | ICD-10-CM

## 2013-09-02 DIAGNOSIS — E785 Hyperlipidemia, unspecified: Secondary | ICD-10-CM

## 2013-09-02 DIAGNOSIS — I10 Essential (primary) hypertension: Secondary | ICD-10-CM | POA: Diagnosis not present

## 2013-09-02 DIAGNOSIS — M1990 Unspecified osteoarthritis, unspecified site: Secondary | ICD-10-CM

## 2013-09-02 DIAGNOSIS — R296 Repeated falls: Secondary | ICD-10-CM

## 2013-09-02 LAB — COMPREHENSIVE METABOLIC PANEL
ALBUMIN: 3.8 g/dL (ref 3.5–5.2)
ALK PHOS: 73 U/L (ref 39–117)
ALT: 17 U/L (ref 0–35)
AST: 21 U/L (ref 0–37)
BILIRUBIN TOTAL: 0.6 mg/dL (ref 0.2–1.2)
BUN: 15 mg/dL (ref 6–23)
CO2: 30 mEq/L (ref 19–32)
CREATININE: 0.9 mg/dL (ref 0.4–1.2)
Calcium: 9.4 mg/dL (ref 8.4–10.5)
Chloride: 106 mEq/L (ref 96–112)
GFR: 64.95 mL/min (ref >60.00)
GLUCOSE: 139 mg/dL — AB (ref 70–99)
Potassium: 3.8 mEq/L (ref 3.5–5.1)
Sodium: 140 mEq/L (ref 135–145)
Total Protein: 6.9 g/dL (ref 6.0–8.3)

## 2013-09-02 LAB — LIPID PANEL
CHOLESTEROL: 177 mg/dL (ref 0–200)
HDL: 56.3 mg/dL (ref >39.00)
LDL Cholesterol: 99 mg/dL (ref 0–99)
NonHDL: 120.7
TRIGLYCERIDES: 109 mg/dL (ref 0.0–149.0)
Total CHOL/HDL Ratio: 3
VLDL: 21.8 mg/dL (ref 0.0–40.0)

## 2013-09-02 LAB — CBC
HEMATOCRIT: 39.1 % (ref 36.0–46.0)
HEMOGLOBIN: 13 g/dL (ref 12.0–15.0)
MCHC: 33.3 g/dL (ref 30.0–36.0)
MCV: 92.2 fl (ref 78.0–100.0)
PLATELETS: 319 10*3/uL (ref 150.0–400.0)
RBC: 4.24 Mil/uL (ref 3.87–5.11)
RDW: 15.6 % — ABNORMAL HIGH (ref 11.5–15.5)
WBC: 8.5 10*3/uL (ref 4.0–10.5)

## 2013-09-02 LAB — TSH: TSH: 0.2 u[IU]/mL — AB (ref 0.35–4.50)

## 2013-09-02 NOTE — Assessment & Plan Note (Signed)
Will recheck TSH today Synthroid refilled per request

## 2013-09-02 NOTE — Assessment & Plan Note (Signed)
Elevated today but she is out of her HCTZ Will check CBC and CMET today BP meds refilled per request

## 2013-09-02 NOTE — Progress Notes (Signed)
Subjective:    Patient ID: [name], female    DOB: Apr 06, 1926, 80 y.o.   MRN: [id number]  HPI  Pt presents to the clinic today with c/o fatigue. This has been a chronic issue for her, since 2011 when her son committed suicide. She reports her husband passed away in May 03, 2013. She also just recently found out that her dog has cancer and that her sister has breast cancer. She does not feel depressed or stressed out. She has not started or stopped any new medications. She reports that she is due for labwork and medication refills. Additionally, she reports that she has had multiple falls over the last year. She request a RX for a walker to use instead of her cane.   Review of Systems      Past Medical History  Diagnosis Date  . Anxiety 12/2002  . Hyperlipidemia 11/2004  . Hypertension 12/2002  . Spinal stenosis   . Osteopenia 02/2002    02/2002 Dexa osteopenia// Dexa 02/26/2004 (Dr. Ubaldo Glassing) stable/IMR femur/spine  . Diabetes mellitus 11/2004    Type 2  . 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 Ronnald Collum endocrinologist  . Cancer     left breast cancer    Current Outpatient Prescriptions  Medication Sig Dispense Refill  . aspirin 81 MG tablet Take 81 mg by mouth daily. Daily with food       . B COMPLEX VITAMINS PO Take 1 tablet by mouth daily.        . Calcium-Magnesium-Vitamin D (CALCIUM 500 PO) Take 1 tablet by mouth daily.      . cholecalciferol (VITAMIN D) 1000 UNITS tablet Take 1,000 Units by mouth daily.        Marland Kitchen gabapentin (NEURONTIN) 100 MG capsule Take 100 mg by mouth 2 (two) times daily.      . hydrochlorothiazide (MICROZIDE) 12.5 MG capsule Take 1 capsule (12.5 mg total) by mouth daily. Please schedule an appointment with Dr Glori Bickers for any additional refills  30 capsule  0  . levothyroxine (SYNTHROID, LEVOTHROID) 75 MCG tablet TAKE ONE TABLET BY MOUTH EVERY DAY  90 tablet  1  . losartan (COZAAR) 100 MG tablet TAKE ONE TABLET BY MOUTH  EVERY DAY  90 tablet  1  . polyethylene glycol powder (GLYCOLAX/MIRALAX) powder Take by mouth daily as needed. As directed for constipation       . vitamin C (ASCORBIC ACID) 500 MG tablet Take 500 mg by mouth daily.       No current facility-administered medications for this visit.    Allergies  Allergen Reactions  . Amlodipine     Leg edema   . Atenolol     REACTION: bradycardia  . Erythromycin Base     REACTION: stomach upset  . Lisinopril     REACTION: cough  (20 mg)    Family History  Problem Relation Age of Onset  . Alzheimer's disease Mother   . Heart disease Mother 74    angina  . Hypertension Mother   . Crohn's disease Mother     ? crohns disease  . Cancer Father 52    colon cancer  . Cancer Sister 50    breast cancer   . COPD Sister     emphysema (smoker)  . Heart disease Sister     angina  . Alcohol abuse Brother   . Diabetes Paternal Aunt   . Stroke Neg Hx   . Hypertension Sister  History   Social History  . Marital Status: Married    Spouse Name: N/A    Number of Children: N/A  . Years of Education: N/A   Occupational History  . Not on file.   Social History Main Topics  . Smoking status: Never Smoker   . Smokeless tobacco: Not on file  . Alcohol Use: No  . Drug Use: No  . Sexual Activity: Not on file   Other Topics Concern  . 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 Denies fever, malaise,  headache or abrupt weight changes.  Respiratory: Denies difficulty breathing, shortness of breath, cough or sputum production.   Cardiovascular: Denies chest pain, chest tightness, palpitations or swelling in the hands or feet.  GU: Pt reports frequency. Denies urgency, pain with urination, burning sensation, blood in urine, odor or discharge. Neurological: Pt has difficulty with balance. Denies dizziness, difficulty with  memory, difficulty with speech.   No other specific complaints in a complete review of systems (except as listed in HPI above).  Objective:   Physical Exam   BP 162/82  Pulse 77  Temp(Src) 98.2 F (36.8 C) (Oral)  Wt 151 lb 12 oz (68.833 kg)  SpO2 99% Wt Readings from Last 3 Encounters:  09/02/13 151 lb 12 oz (68.833 kg)  07/23/12 134 lb 12 oz (61.122 kg)  12/26/11 148 lb 4 oz (67.246 kg)    General: Appearsher stated age, well developed, well nourished in NAD. Cardiovascular: Normal rate and rhythm. S1,S2 noted.  No murmur, rubs or gallops noted. No JVD or BLE edema. No carotid bruits noted. Pulmonary/Chest: Normal effort and positive vesicular breath sounds. No respiratory distress. No wheezes, rales or ronchi noted.  Musculoskeletal: Decreased ROM bilateral knees. Ataxic difficulty with gait.  Neurological: Alert and oriented. Cranial nerves II-XII intact. Coordination normal. +DTRs bilaterally.   BMET    Component Value Date/Time   NA 141 07/23/2012 1319   NA 139 11/15/2009 1041   K 3.9 07/23/2012 1319   K 3.9 11/15/2009 1041   CL 107 07/23/2012 1319   CL 101 11/15/2009 1041   CO2 28 07/23/2012 1319   CO2 28 11/15/2009 1041   GLUCOSE 90 07/23/2012 1319   GLUCOSE 122* 11/15/2009 1041   BUN 28* 07/23/2012 1319   BUN 19 11/15/2009 1041   CREATININE 1.0 07/23/2012 1319   CREATININE 0.7 11/15/2009 1041   CALCIUM 9.1 07/23/2012 1319   CALCIUM 10.1 11/15/2009 1041   GFRNONAA 77* 10/10/2011 0045   GFRAA 90* 10/10/2011 0045    Lipid Panel     Component Value Date/Time   CHOL 170 04/30/2010 1435   TRIG 113.0 04/30/2010 1435   HDL 56.70 04/30/2010 1435   CHOLHDL 3 04/30/2010 1435   VLDL 22.6 04/30/2010 1435   LDLCALC 91 04/30/2010 1435    CBC    Component Value Date/Time   WBC 13.0* 10/10/2011 0045   WBC 9.8 11/15/2009 1041   RBC 3.82* 10/10/2011 0045   RBC 3.88 11/15/2009 1041   HGB 11.8* 10/10/2011 0045   HGB 12.2 11/15/2009 1041   HCT 35.5* 10/10/2011 0045   HCT 35.5 11/15/2009 1041   PLT 342  10/10/2011 0045   PLT 299 11/15/2009 1041   MCV 92.9 10/10/2011 0045   MCV 91 11/15/2009 1041   MCH 30.9 10/10/2011 0045   MCH 31.4 11/15/2009 1041  MCHC 33.2 10/10/2011 0045   MCHC 34.3 11/15/2009 1041   RDW 14.3 10/10/2011 0045   RDW 12.5 11/15/2009 1041   LYMPHSABS 1.5 10/10/2011 0045   LYMPHSABS 1.7 11/15/2009 1041   MONOABS 1.3* 10/10/2011 0045   EOSABS 0.3 10/10/2011 0045   EOSABS 0.2 11/15/2009 1041   BASOSABS 0.0 10/10/2011 0045   BASOSABS 0.1 11/15/2009 1041    Hgb A1C Lab Results  Component Value Date   HGBA1C 5.9 07/17/2009        Assessment & Plan:   Fatigue:  She reports this is chronic issue for her She appears depressed to me but denies this

## 2013-09-02 NOTE — Patient Instructions (Signed)

## 2013-09-02 NOTE — Assessment & Plan Note (Signed)
Hard copy RX given for walker

## 2013-09-02 NOTE — Progress Notes (Signed)
Pre visit review using our clinic review tool, if applicable. No additional management support is needed unless otherwise documented below in the visit note. 

## 2013-09-05 ENCOUNTER — Telehealth: Payer: Self-pay | Admitting: Family Medicine

## 2013-09-05 ENCOUNTER — Other Ambulatory Visit: Payer: Self-pay | Admitting: Internal Medicine

## 2013-09-05 ENCOUNTER — Other Ambulatory Visit: Payer: Self-pay

## 2013-09-05 DIAGNOSIS — R7989 Other specified abnormal findings of blood chemistry: Secondary | ICD-10-CM

## 2013-09-05 DIAGNOSIS — I10 Essential (primary) hypertension: Secondary | ICD-10-CM

## 2013-09-05 MED ORDER — LEVOTHYROXINE SODIUM 50 MCG PO TABS: 50.0000 ug | ORAL_TABLET | Freq: Every day | ORAL | Status: DC

## 2013-09-05 NOTE — Telephone Encounter (Signed)
Relevant patient education mailed to patient.  

## 2013-09-05 NOTE — Telephone Encounter (Signed)
Please advise if okay to fill the Gabapentin

## 2013-09-06 MED ORDER — GABAPENTIN 100 MG PO CAPS: 100.0000 mg | ORAL_CAPSULE | Freq: Two times a day (BID) | ORAL | Status: DC

## 2013-09-06 MED ORDER — HYDROCHLOROTHIAZIDE 12.5 MG PO CAPS: 12.5000 mg | ORAL_CAPSULE | Freq: Every day | ORAL | Status: DC

## 2013-09-12 ENCOUNTER — Telehealth: Payer: Self-pay | Admitting: Family Medicine

## 2013-09-12 NOTE — Telephone Encounter (Signed)
Patient Information:  Caller Name: Derriona  Phone: [phone]  Patient: [name]  Gender: Female  DOB: 13-Jun-1926  Age: 80 Years  PCP: [facility] [place] (Family Practice)  Office Follow Up:  Does the office need to follow up with this patient?: Has appnt scheduled for 09/23/13 @ 11:45  Instructions For The Office: Advised for her to come in sooner to be checked but she refused  RN Note:  Had Mamogram on R breast in Feb and was normal.  Symptoms  Reason For Call & Symptoms: Calling about Injury to R foot when cinder block fell on it a few months ago- uses cane and hurts on the bottom of the foot with weight bearing. R shoulder also hurts on and off after a few injuries in the past and she has limited ROM on R arm.  Dry rash behind both ears that doesn't go away.  She gets dizzy whenever she bends forward. Hx of falls. She has been having trouble with frequent urination and getting to the bathroom on time. Uses commods at bedside but wakes up every 2 hours to void. No burning or pain. Afebrile. Wears Depends but they bother her hip. Last bm on 09/10/13- she is having to disempact herself every few days. Using Miralax prn for constipation.   Hx L breast masectomy and gets some pressure in upper L chest at times and L hand feels numb on and off. Husband  and Brother passed away in [place] an son took life 4 years ago and she has felt depressed on and off since. Chest hurts when she talks about losses. Pain only lasts a few seconds.  Reviewed Health History In EMR: Yes  Reviewed Medications In EMR: Yes  Reviewed Allergies In EMR: Yes  Reviewed Surgeries / Procedures: Yes  Date of Onset of Symptoms: 06/08/2012  Treatments Tried: Travel sickness OTC med, uses cane.  Treatments Tried Worked: No  Guideline(s) Used:  Dizziness  Chest Pain  Disposition Per Guideline:   Home Care  Reason For Disposition Reached:   Intermittent mild chest pain lasting a few seconds each time  Advice Given:  Temporary Dizziness  is usually a harmless symptom. It can be caused by not drinking enough water during sports or hot weather. It can also be caused by skipping a meal, too much sun exposure, standing up suddenly, standing too long in one place or even a viral illness.  Some Causes of Temporary Dizziness:  Poor Fluid Intake - Not drinking enough fluids and being a little dehydrated is a common cause of temporary dizziness. This is always worse during hot weather.  Standing Up Suddenly - Standing up suddenly (especially getting out of bed) or prolonged standing in one place are common causes of temporary dizziness. Not drinking enough fluids always makes it worse. Certain medications can cause or increase this type of dizziness (e.g., blood pressure medications).  Drink Fluids:  Drink several glasses of fruit juice, other clear fluids, or water. This will improve hydration and blood glucose. If you have a fever or have had heat exposure, make sure the fluids are cold.  Rest for 1-2 Hours:  Lie down with feet elevated for 1 hour. This will improve blood flow and increase blood flow to the brain.  Stand Up Slowly:  In the mornings, sit up for a few minutes before you stand up. That will help your blood flow make the adjustment.  If you have to stand up for long periods of time, contract and  relax your leg muscles to help pump the blood back to the heart.  Call Back If:  Still feel dizzy after 2 hours of rest and fluids  Passes out (faints)  You become worse.  Fleeting Chest Pain:  Fleeting chest pains that last only a few seconds and then go away are generally not serious. They may be from pinched muscles or nerves in your chest wall.  Chest Pain Only When Coughing:  Chest pains that occur with coughing generally come from the chest wall and from irritation of the airways. They are usually not serious.  Expected Course:  These mild chest pains usually disappear within 3 days.  Call Back If:   Severe chest pain  Constant chest pain lasting longer than 5 minutes  Difficulty breathing  Fever  Patient Will Follow Care Advice:  YES

## 2013-09-12 NOTE — Telephone Encounter (Signed)
Pt declined to r/s to an earlier date, pt said she will be okay until the 09/23/13, I advise pt if sxs worsen she needs to f/u sooner, pt verbalized understanding

## 2013-09-12 NOTE — Telephone Encounter (Signed)
If any symptoms worsen-please make sooner appt  Please try to make that appt 30 min if poss - since she has so many complaints

## 2013-09-23 ENCOUNTER — Ambulatory Visit (INDEPENDENT_AMBULATORY_CARE_PROVIDER_SITE_OTHER)
Admission: RE | Admit: 2013-09-23 | Discharge: 2013-09-23 | Disposition: A | Discharge status: Home / Self Care | Payer: Medicare Other | Source: Ambulatory Visit | Attending: Family Medicine | Admitting: Family Medicine

## 2013-09-23 ENCOUNTER — Ambulatory Visit (INDEPENDENT_AMBULATORY_CARE_PROVIDER_SITE_OTHER): Payer: Medicare Other | Admitting: Family Medicine

## 2013-09-23 ENCOUNTER — Encounter: Payer: Self-pay | Admitting: Family Medicine

## 2013-09-23 VITALS — BP 150/86 | HR 78 | Temp 98.1°F | Ht 60.5 in | Wt 150.8 lb

## 2013-09-23 DIAGNOSIS — M25519 Pain in unspecified shoulder: Secondary | ICD-10-CM

## 2013-09-23 DIAGNOSIS — M79671 Pain in right foot: Secondary | ICD-10-CM

## 2013-09-23 DIAGNOSIS — M79609 Pain in unspecified limb: Secondary | ICD-10-CM

## 2013-09-23 DIAGNOSIS — R071 Chest pain on breathing: Secondary | ICD-10-CM | POA: Diagnosis not present

## 2013-09-23 DIAGNOSIS — S8990XA Unspecified injury of unspecified lower leg, initial encounter: Secondary | ICD-10-CM | POA: Diagnosis not present

## 2013-09-23 DIAGNOSIS — M25511 Pain in right shoulder: Secondary | ICD-10-CM

## 2013-09-23 DIAGNOSIS — M7989 Other specified soft tissue disorders: Secondary | ICD-10-CM | POA: Diagnosis not present

## 2013-09-23 DIAGNOSIS — S99919A Unspecified injury of unspecified ankle, initial encounter: Secondary | ICD-10-CM | POA: Diagnosis not present

## 2013-09-23 DIAGNOSIS — S46909A Unspecified injury of unspecified muscle, fascia and tendon at shoulder and upper arm level, unspecified arm, initial encounter: Secondary | ICD-10-CM | POA: Diagnosis not present

## 2013-09-23 DIAGNOSIS — R0789 Other chest pain: Secondary | ICD-10-CM

## 2013-09-23 DIAGNOSIS — S4980XA Other specified injuries of shoulder and upper arm, unspecified arm, initial encounter: Secondary | ICD-10-CM | POA: Diagnosis not present

## 2013-09-23 NOTE — Progress Notes (Signed)
Subjective:    Patient ID: [name], female    DOB: 1926/12/25, 80 y.o.   MRN: [id number]  HPI Here for multiple issues   R foot pain - dropped a cinder block on her R foot-several months ago  Also has arthritis in her foot  She had a black and blue great toe with swelling Now improved but bottom of foot hurts  ? If she broke anything   R shoulder pain -- has fallen on it (last fall was "a while" ago -- months) She thinks it is swollen - hard to reach in back of her  Going on for months   Chest pain - over L chest- where her mastectomy was  Does not last "too long" - comes and goes/ sometimes positional Middle and left side-over her mastectomy scar  Is also sore to the touch Not exertional  She is out of shape -gets out of breath when she exerts herself more than previously - but that does not cause chest to hurt   Patient Active Problem List   Diagnosis Date Noted  . Right foot pain 09/23/2013  . Right shoulder pain 09/23/2013  . Other screening mammogram 04/12/2013  . Dizziness and giddiness 05/21/2012  . Falls frequently 05/21/2012  . Urge incontinence 12/26/2011  . Chest pain 09/15/2011  . Hip pain, bilateral 09/15/2011  . Special screening for malignant neoplasms, colon 07/10/2010  . Chest wall pain 07/08/2010  . HYPOTHYROIDISM 04/30/2010  . CONSTIPATION 04/30/2010  . STRESS REACTION, ACUTE, WITH EMOTIONAL DISTURBANCE 10/09/2009  . HYPERGLYCEMIA 07/22/2009  . NEOPLASM, MALIGNANT, BREAST 12/29/2007  . LEG PAIN, BILATERAL 07/01/2007  . BACK PAIN 06/22/2006  . PLUMMER'S DISEASE 06/16/2006  . HYPERLIPIDEMIA 06/16/2006  . ANXIETY 06/16/2006  . HYPERTENSION 06/16/2006  . OSTEOPENIA 06/16/2006   Past Medical History  Diagnosis Date  . Anxiety 12/2002  . Hyperlipidemia 11/2004  . Hypertension 12/2002  . Spinal stenosis   . Osteopenia 02/2002    02/2002 Dexa osteopenia// Dexa 02/26/2004 (Dr. Ubaldo Glassing) stable/IMR femur/spine  . Diabetes mellitus 11/2004   Type 2  . 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 Ronnald Collum endocrinologist  . Cancer     left breast cancer   Past Surgical History  Procedure Laterality Date  . Abdominal hysterectomy  1990    endometrial adenoca  . Breast surgery  1980    left breast lumpectomy //abscess of breast evac.  Marland Kitchen Cholecystectomy  07/10/1987  . Eye surgery      Tear Duct surgery   History  Substance Use Topics  . Smoking status: Never Smoker   . Smokeless tobacco: Not on file  . Alcohol Use: No   Family History  Problem Relation Age of Onset  . Alzheimer's disease Mother   . Heart disease Mother 49    angina  . Hypertension Mother   . Crohn's disease Mother     ? crohns disease  . Cancer Father 16    colon cancer  . Cancer Sister 52    breast cancer   . COPD Sister     emphysema (smoker)  . Heart disease Sister     angina  . Alcohol abuse Brother   . Diabetes Paternal Aunt   . Stroke Neg Hx   . Hypertension Sister    Allergies  Allergen Reactions  . Amlodipine     Leg edema   . Atenolol     REACTION: bradycardia  . Erythromycin Base  REACTION: stomach upset  . Lisinopril     REACTION: cough  (20 mg)   Current Outpatient Prescriptions on File Prior to Visit  Medication Sig Dispense Refill  . aspirin 81 MG tablet Take 81 mg by mouth daily. Daily with food       . cholecalciferol (VITAMIN D) 1000 UNITS tablet Take 1,000 Units by mouth daily.        Marland Kitchen gabapentin (NEURONTIN) 100 MG capsule Take 1 capsule (100 mg total) by mouth 2 (two) times daily.  60 capsule  0  . hydrochlorothiazide (MICROZIDE) 12.5 MG capsule Take 1 capsule (12.5 mg total) by mouth daily. Please schedule an appointment with Dr Glori Bickers for any additional refills  90 capsule  0  . levothyroxine (SYNTHROID, LEVOTHROID) 50 MCG tablet Take 1 tablet (50 mcg total) by mouth daily.  30 tablet  1  . meclizine (ANTIVERT) 25 MG tablet Take 25 mg by mouth 2 (two) times daily as needed for  dizziness.      . polyethylene glycol powder (GLYCOLAX/MIRALAX) powder Take by mouth daily as needed. As directed for constipation       . vitamin C (ASCORBIC ACID) 500 MG tablet Take 500 mg by mouth daily.       No current facility-administered medications on file prior to visit.     Review of Systems Review of Systems  Constitutional: Negative for fever, appetite change, fatigue and unexpected weight change.  Eyes: Negative for pain and visual disturbance.  Respiratory: Negative for cough and wheezing  Cardiovascular: Negative for palpitations, exertional cp, pnd/ orthopnea or pedal edema    Gastrointestinal: Negative for nausea, diarrhea and constipation.  Genitourinary: Negative for urgency and frequency.  Skin: Negative for pallor or rash   MSK pos for foot and shoulder pain / has had several falls  Neurological: Negative for weakness, light-headedness, numbness and headaches.  Hematological: Negative for adenopathy. Does not bruise/bleed easily.  Psychiatric/Behavioral: Negative for dysphoric mood. The patient is not nervous/anxious.         Objective:   Physical Exam  Constitutional: She appears well-developed and well-nourished. No distress.  Frail appearing elderly female   HENT:  Head: Normocephalic and atraumatic.  Mouth/Throat: Oropharynx is clear and moist.  Eyes: Conjunctivae and EOM are normal. Pupils are equal, round, and reactive to light. No scleral icterus.  Neck: Normal range of motion. Neck supple. No JVD present. Carotid bruit is not present. No thyromegaly present.  Cardiovascular: Normal rate, regular rhythm, normal heart sounds and intact distal pulses.  Exam reveals no gallop.   No murmur heard. Pulmonary/Chest: Effort normal and breath sounds normal. No respiratory distress. She has no wheezes. She has no rales. She exhibits tenderness.  Left anterior chest wall tenderness without crepitus or skin change   Abdominal: Soft. Bowel sounds are normal. She  exhibits no distension. There is no tenderness. There is no rebound.  Musculoskeletal: She exhibits tenderness. She exhibits no edema.  R foot -has signs of OA of toes  Mildly tender over plantar surface/arch of foot -nl rom  No calcaneous tenderness Nl rom all toes No acute swelling or erythema   R shoulder- very limited rom  Large hematoma vs effusion  Acromion tenderness/ pain on hawking /neer and int/ext rot Cannot abduct shoulder much at all    Lymphadenopathy:    She has no cervical adenopathy.  Neurological: She is alert. She has normal reflexes. No cranial nerve deficit. She exhibits normal muscle tone. Coordination normal.  Skin: Skin  is warm and dry. No rash noted. No erythema. No pallor.  Psychiatric: She has a normal mood and affect.          Assessment & Plan:   Problem List Items Addressed This Visit     Other   Chest wall pain - Primary     Similar to past - with tenderness over mastectomy scar Check xray Has had several falls  Pain is atypical    Relevant Orders      DG Chest 2 View (Completed)   Right foot pain     After dropping a cinder block on it  Tender over midfoot Xray today    Relevant Orders      DG Foot Complete Right (Completed)   Right shoulder pain     After a fall some weeks ago Shoulder is swelling - feels like an effusion  Limited rom  Xray today    Relevant Orders      DG Shoulder Right (Completed)

## 2013-09-23 NOTE — Assessment & Plan Note (Signed)
After dropping a cinder block on it  Tender over midfoot Xray today

## 2013-09-23 NOTE — Assessment & Plan Note (Signed)
After a fall some weeks ago Shoulder is swelling - feels like an effusion  Limited rom  Xray today

## 2013-09-23 NOTE — Patient Instructions (Signed)
xrays today Of chest - for your chest pain and tenderness Right foot (after trauma) Right shoulder (after trauma) Use ice on your shoulder whenever you can   We will contact you with results so we can make a plan

## 2013-09-23 NOTE — Progress Notes (Signed)
Pre visit review using our clinic review tool, if applicable. No additional management support is needed unless otherwise documented below in the visit note. 

## 2013-09-23 NOTE — Assessment & Plan Note (Signed)
Similar to past - with tenderness over mastectomy scar Check xray Has had several falls  Pain is atypical

## 2013-09-25 ENCOUNTER — Telehealth: Payer: Self-pay | Admitting: Family Medicine

## 2013-09-25 DIAGNOSIS — M25511 Pain in right shoulder: Secondary | ICD-10-CM

## 2013-09-25 NOTE — Telephone Encounter (Signed)
Message copied by Abner Greenspan on Sun Sep 25, 2013 11:47 AM ------      Message from: Tammi Sou      Created: Fri Sep 23, 2013  4:47 PM       Pt notified of xray results and Dr. Marliss Coots comments. Pt agrees to see ortho if there is one in [place], she doesn't know her way around [place] well enough to see a doctor there. I advised pt that Marion/Linda will call to schedule appt ------

## 2013-10-17 DIAGNOSIS — M25519 Pain in unspecified shoulder: Secondary | ICD-10-CM | POA: Diagnosis not present

## 2013-10-17 DIAGNOSIS — G8929 Other chronic pain: Secondary | ICD-10-CM | POA: Diagnosis not present

## 2013-10-17 DIAGNOSIS — M19019 Primary osteoarthritis, unspecified shoulder: Secondary | ICD-10-CM | POA: Diagnosis not present

## 2013-10-26 ENCOUNTER — Other Ambulatory Visit: Payer: Self-pay | Admitting: Internal Medicine

## 2013-10-28 NOTE — Telephone Encounter (Signed)
Please refill for 6 mo 

## 2013-10-28 NOTE — Telephone Encounter (Signed)
Dr. Glori Bickers pt

## 2013-10-28 NOTE — Telephone Encounter (Signed)
done

## 2013-10-28 NOTE — Telephone Encounter (Signed)
Last filled 09/06/13--please advise

## 2013-11-09 ENCOUNTER — Other Ambulatory Visit: Payer: Self-pay | Admitting: Internal Medicine

## 2013-12-02 ENCOUNTER — Other Ambulatory Visit: Payer: Self-pay | Admitting: Internal Medicine

## 2013-12-08 ENCOUNTER — Other Ambulatory Visit: Payer: Self-pay | Admitting: Family Medicine

## 2013-12-26 ENCOUNTER — Other Ambulatory Visit: Payer: Self-pay | Admitting: Family Medicine

## 2013-12-26 NOTE — Telephone Encounter (Signed)
She needs to come in for tsh before I can px

## 2013-12-26 NOTE — Telephone Encounter (Signed)
Electronic refill request, pt never came back for future labs, see lab notes from June, please advise

## 2013-12-27 DIAGNOSIS — H40033 Anatomical narrow angle, bilateral: Secondary | ICD-10-CM | POA: Diagnosis not present

## 2013-12-27 NOTE — Telephone Encounter (Signed)
Pt has no transportation right now her car is in the shop and they don't know what's wrong with it or when it will be fixed pt said her son may be able to bring her up here but she isn't sure when because he works a lot, pt said she is almost out of meds, please advise

## 2013-12-27 NOTE — Telephone Encounter (Signed)
Please refill a month while she organizes her transportation

## 2013-12-28 NOTE — Telephone Encounter (Signed)
done

## 2014-01-03 ENCOUNTER — Other Ambulatory Visit (INDEPENDENT_AMBULATORY_CARE_PROVIDER_SITE_OTHER): Payer: Medicare Other

## 2014-01-03 DIAGNOSIS — R946 Abnormal results of thyroid function studies: Secondary | ICD-10-CM

## 2014-01-03 DIAGNOSIS — R7989 Other specified abnormal findings of blood chemistry: Secondary | ICD-10-CM

## 2014-01-04 LAB — TSH: TSH: 2.32 u[IU]/mL (ref 0.35–4.50)

## 2014-01-04 LAB — T4, FREE: FREE T4: 1.13 ng/dL (ref 0.60–1.60)

## 2014-01-22 ENCOUNTER — Other Ambulatory Visit: Payer: Self-pay | Admitting: Family Medicine

## 2014-01-25 ENCOUNTER — Other Ambulatory Visit: Payer: Self-pay | Admitting: Family Medicine

## 2014-02-04 ENCOUNTER — Emergency Department: Payer: Self-pay | Admitting: Student

## 2014-02-04 DIAGNOSIS — S299XXA Unspecified injury of thorax, initial encounter: Secondary | ICD-10-CM | POA: Diagnosis not present

## 2014-02-04 DIAGNOSIS — M542 Cervicalgia: Secondary | ICD-10-CM | POA: Diagnosis not present

## 2014-02-04 DIAGNOSIS — C50919 Malignant neoplasm of unspecified site of unspecified female breast: Secondary | ICD-10-CM | POA: Diagnosis not present

## 2014-02-04 DIAGNOSIS — S0003XA Contusion of scalp, initial encounter: Secondary | ICD-10-CM | POA: Diagnosis not present

## 2014-02-04 DIAGNOSIS — S0190XA Unspecified open wound of unspecified part of head, initial encounter: Secondary | ICD-10-CM | POA: Diagnosis not present

## 2014-02-04 DIAGNOSIS — Z23 Encounter for immunization: Secondary | ICD-10-CM | POA: Diagnosis not present

## 2014-02-04 DIAGNOSIS — M25552 Pain in left hip: Secondary | ICD-10-CM | POA: Diagnosis not present

## 2014-02-04 DIAGNOSIS — S098XXA Other specified injuries of head, initial encounter: Secondary | ICD-10-CM | POA: Diagnosis not present

## 2014-02-04 DIAGNOSIS — I10 Essential (primary) hypertension: Secondary | ICD-10-CM | POA: Diagnosis not present

## 2014-02-04 DIAGNOSIS — S0990XA Unspecified injury of head, initial encounter: Secondary | ICD-10-CM | POA: Diagnosis not present

## 2014-02-04 DIAGNOSIS — R51 Headache: Secondary | ICD-10-CM | POA: Diagnosis not present

## 2014-02-04 DIAGNOSIS — S79912A Unspecified injury of left hip, initial encounter: Secondary | ICD-10-CM | POA: Diagnosis not present

## 2014-02-04 DIAGNOSIS — S199XXA Unspecified injury of neck, initial encounter: Secondary | ICD-10-CM | POA: Diagnosis not present

## 2014-02-04 LAB — COMPREHENSIVE METABOLIC PANEL
ALBUMIN: 3.6 g/dL (ref 3.4–5.0)
ALK PHOS: 97 U/L
ALT: 24 U/L
Anion Gap: 5 — ABNORMAL LOW (ref 7–16)
BUN: 15 mg/dL (ref 7–18)
Bilirubin,Total: 0.3 mg/dL (ref 0.2–1.0)
CALCIUM: 8.9 mg/dL (ref 8.5–10.1)
CHLORIDE: 106 mmol/L (ref 98–107)
CO2: 29 mmol/L (ref 21–32)
CREATININE: 0.92 mg/dL (ref 0.60–1.30)
EGFR (African American): >60
Glucose: 108 mg/dL — ABNORMAL HIGH (ref 65–99)
OSMOLALITY: 281 (ref 275–301)
POTASSIUM: 3.9 mmol/L (ref 3.5–5.1)
SGOT(AST): 18 U/L (ref 15–37)
Sodium: 140 mmol/L (ref 136–145)
Total Protein: 7.2 g/dL (ref 6.4–8.2)

## 2014-02-04 LAB — CBC
HCT: 43 % (ref 35.0–47.0)
HGB: 13.8 g/dL (ref 12.0–16.0)
MCH: 30 pg (ref 26.0–34.0)
MCHC: 32.1 g/dL (ref 32.0–36.0)
MCV: 94 fL (ref 80–100)
Platelet: 313 10*3/uL (ref 150–440)
RBC: 4.6 10*6/uL (ref 3.80–5.20)
RDW: 14.8 % — ABNORMAL HIGH (ref 11.5–14.5)
WBC: 12.1 10*3/uL — ABNORMAL HIGH (ref 3.6–11.0)

## 2014-02-04 LAB — URINALYSIS, COMPLETE
BILIRUBIN, UR: NEGATIVE
GLUCOSE, UR: NEGATIVE mg/dL (ref 0–75)
Hyaline Cast: 2
Ketone: NEGATIVE
Nitrite: NEGATIVE
PH: 6 (ref 4.5–8.0)
Protein: NEGATIVE
RBC,UR: 2 /HPF (ref 0–5)
Specific Gravity: 1.014 (ref 1.003–1.030)
Squamous Epithelial: 10

## 2014-02-04 LAB — APTT: ACTIVATED PTT: 28.5 s (ref 23.6–35.9)

## 2014-02-04 LAB — TROPONIN I: Troponin-I: <0.02 ng/mL

## 2014-02-04 LAB — PROTIME-INR
INR: 0.9
Prothrombin Time: 12.1 secs (ref 11.5–14.7)

## 2014-02-15 ENCOUNTER — Ambulatory Visit (INDEPENDENT_AMBULATORY_CARE_PROVIDER_SITE_OTHER): Payer: Medicare Other | Admitting: Family Medicine

## 2014-02-15 ENCOUNTER — Encounter: Payer: Self-pay | Admitting: Family Medicine

## 2014-02-15 VITALS — BP 152/92 | HR 80 | Temp 98.1°F | Ht 60.5 in | Wt 154.0 lb

## 2014-02-15 DIAGNOSIS — R296 Repeated falls: Secondary | ICD-10-CM

## 2014-02-15 DIAGNOSIS — S0001XA Abrasion of scalp, initial encounter: Secondary | ICD-10-CM | POA: Diagnosis not present

## 2014-02-15 DIAGNOSIS — I10 Essential (primary) hypertension: Secondary | ICD-10-CM | POA: Diagnosis not present

## 2014-02-15 DIAGNOSIS — M25552 Pain in left hip: Secondary | ICD-10-CM | POA: Insufficient documentation

## 2014-02-15 DIAGNOSIS — M4806 Spinal stenosis, lumbar region: Secondary | ICD-10-CM

## 2014-02-15 DIAGNOSIS — M48061 Spinal stenosis, lumbar region without neurogenic claudication: Secondary | ICD-10-CM

## 2014-02-15 MED ORDER — LOSARTAN POTASSIUM-HCTZ 50-12.5 MG PO TABS: 1.0000 | ORAL_TABLET | Freq: Every day | ORAL | Status: DC

## 2014-02-15 NOTE — Progress Notes (Signed)
Subjective:    Patient ID: [name], female    DOB: 28-Apr-1926, 80 y.o.   MRN: [id number]  HPI Here for f/u ER 11/25 /15   Was reaching for something, report says she got dizzy and fell  Hit back of head on a corner - had abrasion that bled a lot  She forgot to wear her medic alert button that day  Got bleeding under control with wet cloth/pressure   Work up: EKG nsr rate 72 ua neg Wbc 12.1-other labs nl cxr no change  CT head -small vessel ischemic change CT CS-severe arthritis  L hip -no fx    HTN BP Readings from Last 3 Encounters:  02/15/14 152/92  09/23/13 150/86  09/02/13 162/82   she does take occ aleve/advil for pain  Tylenol "does not help"      Her head is still very sore  Her L hip still hurts "really bad" --- ? Bruise  No headache however   She is in chronic pain  Also gets up to urinate a lot at night   Also spinal stenosis is acting up   Falls frequently  She has a walker but house is too small to get it into each room  Also a step between rooms    Patient Active Problem List   Diagnosis Date Noted  . Right foot pain 09/23/2013  . Right shoulder pain 09/23/2013  . Other screening mammogram 04/12/2013  . Dizziness and giddiness 05/21/2012  . Falls frequently 05/21/2012  . Urge incontinence 12/26/2011  . Chest pain 09/15/2011  . Hip pain, bilateral 09/15/2011  . Special screening for malignant neoplasms, colon 07/10/2010  . Chest wall pain 07/08/2010  . HYPOTHYROIDISM 04/30/2010  . CONSTIPATION 04/30/2010  . STRESS REACTION, ACUTE, WITH EMOTIONAL DISTURBANCE 10/09/2009  . HYPERGLYCEMIA 07/22/2009  . NEOPLASM, MALIGNANT, BREAST 12/29/2007  . LEG PAIN, BILATERAL 07/01/2007  . BACK PAIN 06/22/2006  . PLUMMER'S DISEASE 06/16/2006  . HYPERLIPIDEMIA 06/16/2006  . ANXIETY 06/16/2006  . HYPERTENSION 06/16/2006  . OSTEOPENIA 06/16/2006   Past Medical History  Diagnosis Date  . Anxiety 12/2002  . Hyperlipidemia 11/2004  .  Hypertension 12/2002  . Spinal stenosis   . Osteopenia 02/2002    02/2002 Dexa osteopenia// Dexa 02/26/2004 (Dr. Ubaldo Glassing) stable/IMR femur/spine  . Diabetes mellitus 11/2004    Type 2  . 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 Ronnald Collum endocrinologist  . Cancer     left breast cancer   Past Surgical History  Procedure Laterality Date  . Abdominal hysterectomy  1990    endometrial adenoca  . Breast surgery  1980    left breast lumpectomy //abscess of breast evac.  Marland Kitchen Cholecystectomy  07/10/1987  . Eye surgery      Tear Duct surgery   History  Substance Use Topics  . Smoking status: Never Smoker   . Smokeless tobacco: Never Used  . Alcohol Use: No   Family History  Problem Relation Age of Onset  . Alzheimer's disease Mother   . Heart disease Mother 39    angina  . Hypertension Mother   . Crohn's disease Mother     ? crohns disease  . Cancer Father 73    colon cancer  . Cancer Sister 1    breast cancer   . COPD Sister     emphysema (smoker)  . Heart disease Sister     angina  . Alcohol abuse Brother   . Diabetes  Paternal Aunt   . Stroke Neg Hx   . Hypertension Sister    Allergies  Allergen Reactions  . Amlodipine     Leg edema   . Atenolol     REACTION: bradycardia  . Erythromycin Base     REACTION: stomach upset  . Lisinopril     REACTION: cough  (20 mg)   Current Outpatient Prescriptions on File Prior to Visit  Medication Sig Dispense Refill  . aspirin 81 MG tablet Take 81 mg by mouth daily. Daily with food     . cholecalciferol (VITAMIN D) 1000 UNITS tablet Take 1,000 Units by mouth daily.      Marland Kitchen gabapentin (NEURONTIN) 100 MG capsule TAKE 1 CAPSULE (100 MG TOTAL) BY MOUTH 2 (TWO) TIMES DAILY. 60 capsule 5  . hydrochlorothiazide (MICROZIDE) 12.5 MG capsule TAKE ONE CAPSULE BY MOUTH DAILY 90 capsule 0  . levothyroxine (SYNTHROID, LEVOTHROID) 50 MCG tablet TAKE 1 TABLET BY MOUTH DAILY 30 tablet 5  . meclizine (ANTIVERT) 25 MG  tablet Take 25 mg by mouth 2 (two) times daily as needed for dizziness.    . polyethylene glycol powder (GLYCOLAX/MIRALAX) powder Take by mouth daily as needed. As directed for constipation     . vitamin C (ASCORBIC ACID) 500 MG tablet Take 500 mg by mouth daily.     No current facility-administered medications on file prior to visit.    Review of Systems Review of Systems  Constitutional: Negative for fever, appetite change, fatigue and unexpected weight change.  Eyes: Negative for pain and visual disturbance.  Respiratory: Negative for cough and shortness of breath.   Cardiovascular: Negative for cp or palpitations    Gastrointestinal: Negative for nausea, diarrhea and constipation.  Genitourinary: Negative for urgency and frequency.  Skin: Negative for pallor or rash   MSK pos for L hip and chronic low back pain  Neurological: Negative for weakness, light-headedness, numbness and headaches. pos for soreness of head at point of impact (with abrasion), pos for poor balance and frequent falls  Hematological: Negative for adenopathy. Does not bruise/bleed easily.  Psychiatric/Behavioral: Negative for dysphoric mood. The patient is  nervous/anxious.         Objective:   Physical Exam  Constitutional: She appears well-developed and well-nourished. No distress.  HENT:  Head: Normocephalic and atraumatic.  Mouth/Throat: Oropharynx is clear and moist.  Eyes: Conjunctivae and EOM are normal. Pupils are equal, round, and reactive to light. Right eye exhibits no discharge. Left eye exhibits no discharge. No scleral icterus.  Neck: Normal range of motion. Neck supple. No JVD present. Carotid bruit is not present. No thyromegaly present.  Cardiovascular: Normal rate, regular rhythm, normal heart sounds and intact distal pulses.  Exam reveals no gallop.   Pulmonary/Chest: Effort normal and breath sounds normal. No respiratory distress. She has no wheezes. She has no rales.  Abdominal: Soft. Bowel  sounds are normal. She exhibits no distension. There is no tenderness.  Musculoskeletal: She exhibits tenderness. She exhibits no edema.  Tender LS diffusely (bony tenderness) Pain over L greater trochanter with pain to ext rotate and flex hip   Nl rom neck     Lymphadenopathy:    She has no cervical adenopathy.  Neurological: She is alert. She has normal reflexes. No cranial nerve deficit. She exhibits normal muscle tone. Coordination normal.  Skin: Skin is warm and dry. No rash noted. No erythema. No pallor.  Skin abrasion at L post scalp is healing well-no s/s of infection Hematoma is  tender   Psychiatric: Her mood appears anxious.          Assessment & Plan:   Problem List Items Addressed This Visit      Cardiovascular and Mediastinum   Essential hypertension - Primary    Her bp tends to go up with stress No headache or other symptoms  Watch closely  Is improved from her hosp    Relevant Medications      LOSARTAN POTASSIUM-HCTZ 50-12.5 MG PO TABS     Musculoskeletal and Integument   Scalp abrasion    Area is tender but healing well with scab and no signs of infx Rev s/s of closed head inj to watch for incl ha and nausea and confusion       Other   Falls frequently    Recent hosp from fall- rev notes/ studies and labs in detail -see HPI Disc this in detail  Multifactorial  inst to use walker at all times as well as bedside commode She cannot get walker into all rooms of her home and refuses to move and cannot afford help/home care  Did ref her to ortho for pain and mobility issues ? If PT would be helpful in the future    Left hip pain    T[facility]ness at [facility]-after a fall  Rev hosp xr-no fx  Is further limiting mobility Ref to orthopedics for this and chronic back pain     Relevant Orders      Ambulatory referral to Orthopedic Surgery   Spinal stenosis of lumbar region    Beginning to affect ambulation more - and plays a role in falls  Ref to  orthopedics Enc to use a walker at all times when able    Relevant Orders      Ambulatory referral to Orthopedic Surgery

## 2014-02-15 NOTE — Patient Instructions (Addendum)
Stop your hctz (microzide) Replace it with losartan hct 50-12.5 one daily in am (you can pick it up from [facility] tomorrow am) We need to get your blood pressure down  Head wound looks good- use cool compresses Follow up with me in 2-3 weeks If any problems or side effects let me know   I want you to use your walker if possible at all times to prevent falls  You may need to move to a residence where you can get a walker into each room   We will refer you to orthopedics for hip and back pain - we will call you about that

## 2014-02-15 NOTE — Progress Notes (Signed)
Pre visit review using our clinic review tool, if applicable. No additional management support is needed unless otherwise documented below in the visit note. 

## 2014-02-19 NOTE — Assessment & Plan Note (Signed)
Area is tender but healing well with scab and no signs of infx Rev s/s of closed head inj to watch for incl ha and nausea and confusion

## 2014-02-19 NOTE — Assessment & Plan Note (Signed)
Beginning to affect ambulation more - and plays a role in falls  Ref to orthopedics Enc to use a walker at all times when able

## 2014-02-19 NOTE — Assessment & Plan Note (Signed)
Recent hosp from fall- rev notes/ studies and labs in detail -see HPI Disc this in detail  Multifactorial  inst to use walker at all times as well as bedside commode She cannot get walker into all rooms of her home and refuses to move and cannot afford help/home care  Did ref her to ortho for pain and mobility issues ? If PT would be helpful in the future

## 2014-02-19 NOTE — Assessment & Plan Note (Signed)
Tenderness at [facility]-after a fall  Rev hosp xr-no fx  Is further limiting mobility Ref to orthopedics for this and chronic back pain

## 2014-02-19 NOTE — Assessment & Plan Note (Signed)
Her bp tends to go up with stress No headache or other symptoms  Watch closely  Is improved from her hosp

## 2014-02-22 ENCOUNTER — Encounter: Payer: Self-pay | Admitting: Family Medicine

## 2014-03-03 ENCOUNTER — Other Ambulatory Visit: Payer: Self-pay | Admitting: Family Medicine

## 2014-03-27 ENCOUNTER — Telehealth: Payer: Self-pay | Admitting: Family Medicine

## 2014-03-27 NOTE — Telephone Encounter (Signed)
Pt had appt with [facility] on 03/14/14 and cancelled. She does not want to re-sch. States she is feeling much better. Referral cancelled

## 2014-03-29 ENCOUNTER — Ambulatory Visit (INDEPENDENT_AMBULATORY_CARE_PROVIDER_SITE_OTHER): Payer: Medicare Other | Admitting: Family Medicine

## 2014-03-29 ENCOUNTER — Encounter: Payer: Self-pay | Admitting: Family Medicine

## 2014-03-29 VITALS — BP 150/90 | HR 85 | Temp 97.5°F | Ht 60.5 in | Wt 151.4 lb

## 2014-03-29 DIAGNOSIS — I10 Essential (primary) hypertension: Secondary | ICD-10-CM | POA: Diagnosis not present

## 2014-03-29 MED ORDER — LOSARTAN POTASSIUM-HCTZ 100-25 MG PO TABS: 1.0000 | ORAL_TABLET | Freq: Every day | ORAL | Status: DC

## 2014-03-29 NOTE — Patient Instructions (Signed)
Increase your losartan hct to 100-25 mg  So the pills you have left take 2 pills one per day  The new prescription is for the higher strength-so just take one a day after you fill it  Take care of yourself  Watch sodium in your diet   Follow up in 4-6 weeks

## 2014-03-29 NOTE — Progress Notes (Signed)
Subjective:    Patient ID: [name], female    DOB: 04-14-1926, 81 y.o.   MRN: [id number]  HPI Here for f/u of HTN   Cancelled her ortho appt - due to feeling better   No further falls    Changed bp to losartan hct for elevated bp  Has not checked bp at home  BP Readings from Last 3 Encounters:  03/29/14 150/90  02/15/14 152/92  09/23/13 150/86   No ha or cp   Patient Active Problem List   Diagnosis Date Noted  . Scalp abrasion 02/15/2014  . Left hip pain 02/15/2014  . Spinal stenosis of lumbar region 02/15/2014  . Right foot pain 09/23/2013  . Right shoulder pain 09/23/2013  . Other screening mammogram 04/12/2013  . Dizziness and giddiness 05/21/2012  . Falls frequently 05/21/2012  . Urge incontinence 12/26/2011  . Chest pain 09/15/2011  . Hip pain, bilateral 09/15/2011  . Special screening for malignant neoplasms, colon 07/10/2010  . Chest wall pain 07/08/2010  . HYPOTHYROIDISM 04/30/2010  . CONSTIPATION 04/30/2010  . STRESS REACTION, ACUTE, WITH EMOTIONAL DISTURBANCE 10/09/2009  . HYPERGLYCEMIA 07/22/2009  . NEOPLASM, MALIGNANT, BREAST 12/29/2007  . LEG PAIN, BILATERAL 07/01/2007  . BACK PAIN 06/22/2006  . PLUMMER'S DISEASE 06/16/2006  . HYPERLIPIDEMIA 06/16/2006  . ANXIETY 06/16/2006  . Essential hypertension 06/16/2006  . OSTEOPENIA 06/16/2006   Past Medical History  Diagnosis Date  . Anxiety 12/2002  . Hyperlipidemia 11/2004  . Hypertension 12/2002  . Spinal stenosis   . Osteopenia 02/2002    02/2002 Dexa osteopenia// Dexa 02/26/2004 (Dr. Ubaldo Glassing) stable/IMR femur/spine  . Diabetes mellitus 11/2004    Type 2  . 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 Ronnald Collum endocrinologist  . Cancer     left breast cancer   Past Surgical History  Procedure Laterality Date  . Abdominal hysterectomy  1990    endometrial adenoca  . Breast surgery  1980    left breast lumpectomy //abscess of breast evac.  Marland Kitchen Cholecystectomy   07/10/1987  . Eye surgery      Tear Duct surgery   History  Substance Use Topics  . Smoking status: Never Smoker   . Smokeless tobacco: Never Used  . Alcohol Use: No   Family History  Problem Relation Age of Onset  . Alzheimer's disease Mother   . Heart disease Mother 84    angina  . Hypertension Mother   . Crohn's disease Mother     ? crohns disease  . Cancer Father 50    colon cancer  . Cancer Sister 35    breast cancer   . COPD Sister     emphysema (smoker)  . Heart disease Sister     angina  . Alcohol abuse Brother   . Diabetes Paternal Aunt   . Stroke Neg Hx   . Hypertension Sister    Allergies  Allergen Reactions  . Amlodipine     Leg edema   . Atenolol     REACTION: bradycardia  . Erythromycin Base     REACTION: stomach upset  . Lisinopril     REACTION: cough  (20 mg)   Current Outpatient Prescriptions on File Prior to Visit  Medication Sig Dispense Refill  . aspirin 81 MG tablet Take 81 mg by mouth daily. Daily with food     . cholecalciferol (VITAMIN D) 1000 UNITS tablet Take 1,000 Units by mouth daily.      Marland Kitchen  gabapentin (NEURONTIN) 100 MG capsule TAKE 1 CAPSULE (100 MG TOTAL) BY MOUTH 2 (TWO) TIMES DAILY. 60 capsule 5  . levothyroxine (SYNTHROID, LEVOTHROID) 50 MCG tablet TAKE 1 TABLET BY MOUTH DAILY 30 tablet 5  . losartan-hydrochlorothiazide (HYZAAR) 50-12.5 MG per tablet Take 1 tablet by mouth daily. 30 tablet 3  . meclizine (ANTIVERT) 25 MG tablet Take 25 mg by mouth 2 (two) times daily as needed for dizziness.    . polyethylene glycol powder (GLYCOLAX/MIRALAX) powder Take by mouth daily as needed. As directed for constipation     . vitamin C (ASCORBIC ACID) 500 MG tablet Take 500 mg by mouth daily.     No current facility-administered medications on file prior to visit.     Review of Systems Review of Systems  Constitutional: Negative for fever, appetite change, fatigue and unexpected weight change.  Eyes: Negative for pain and visual  disturbance.  Respiratory: Negative for cough and shortness of breath.   Cardiovascular: Negative for cp or palpitations    Gastrointestinal: Negative for nausea, diarrhea and constipation.  Genitourinary: Negative for urgency and frequency.  Skin: Negative for pallor or rash   Neurological: Negative for weakness, light-headedness, numbness and headaches.  Hematological: Negative for adenopathy. Does not bruise/bleed easily.  Psychiatric/Behavioral: Negative for dysphoric mood. The patient is not nervous/anxious.         Objective:   Physical Exam  Constitutional: She appears well-developed and well-nourished. No distress.  HENT:  Head: Normocephalic and atraumatic.  Mouth/Throat: Oropharynx is clear and moist.  Eyes: Conjunctivae and EOM are normal. Pupils are equal, round, and reactive to light. No scleral icterus.  Neck: Normal range of motion. Neck supple. No JVD present. Carotid bruit is not present.  Cardiovascular: Normal rate, regular rhythm, normal heart sounds and intact distal pulses.   Pulmonary/Chest: Effort normal and breath sounds normal. No respiratory distress. She has no wheezes.  Musculoskeletal: She exhibits no edema.  Lymphadenopathy:    She has no cervical adenopathy.  Neurological: She is alert. She has normal reflexes.  Skin: Skin is warm. No rash noted. No pallor.  Psychiatric: She has a normal mood and affect.          Assessment & Plan:   Problem List Items Addressed This Visit      Cardiovascular and Mediastinum   Essential hypertension - Primary    bp is still not at goal Will inc losartan hct to 100-25 mg daily  Disc poss side eff-will update if any problems Disc lifestyle and low sodium diet  F/u 4-6 wk         Relevant Medications   LOSARTAN POTASSIUM-HCTZ 100-25 MG PO TABS

## 2014-03-29 NOTE — Progress Notes (Signed)
Pre visit review using our clinic review tool, if applicable. No additional management support is needed unless otherwise documented below in the visit note. 

## 2014-03-29 NOTE — Assessment & Plan Note (Signed)
bp is still not at goal Will inc losartan hct to 100-25 mg daily  Disc poss side eff-will update if any problems Disc lifestyle and low sodium diet  F/u 4-6 wk

## 2014-04-07 ENCOUNTER — Other Ambulatory Visit: Payer: Self-pay | Admitting: Family Medicine

## 2014-04-10 MED ORDER — LOSARTAN POTASSIUM-HCTZ 100-25 MG PO TABS: 1.0000 | ORAL_TABLET | Freq: Every day | ORAL | Status: DC

## 2014-04-10 NOTE — Telephone Encounter (Signed)
Please send the higher dosage and cancel the 50-12.5 Thanks

## 2014-04-10 NOTE — Telephone Encounter (Signed)
Sent on Rx for the losartan-hydrochlorothiazide 100-25 MG per tablet

## 2014-04-10 NOTE — Telephone Encounter (Signed)
Med was just prescribed on 03/29/14. Note from pharmacy, "PT [name]. IF SO, MAY WEPLEASE HAVE A NEW RX?." Ok to refill?

## 2014-05-16 ENCOUNTER — Encounter: Payer: Self-pay | Admitting: Family Medicine

## 2014-05-16 ENCOUNTER — Ambulatory Visit (INDEPENDENT_AMBULATORY_CARE_PROVIDER_SITE_OTHER): Payer: Medicare Other | Admitting: Family Medicine

## 2014-05-16 VITALS — BP 130/70 | HR 76 | Temp 98.3°F | Ht 60.5 in | Wt 152.8 lb

## 2014-05-16 DIAGNOSIS — L309 Dermatitis, unspecified: Secondary | ICD-10-CM | POA: Insufficient documentation

## 2014-05-16 DIAGNOSIS — I10 Essential (primary) hypertension: Secondary | ICD-10-CM | POA: Diagnosis not present

## 2014-05-16 MED ORDER — MOMETASONE FUROATE 0.1 % EX CREA: 1.0000 "application " | TOPICAL_CREAM | Freq: Every day | CUTANEOUS | Status: DC

## 2014-05-16 NOTE — Progress Notes (Signed)
Pre visit review using our clinic review tool, if applicable. No additional management support is needed unless otherwise documented below in the visit note. 

## 2014-05-16 NOTE — Patient Instructions (Signed)
Try the elocon cream on rash behind ears - and let me know if no improvement in the next 2 weeks Continue current blood pressure medicine - it is working better  Labs today to check kidney function on the losartan hct

## 2014-05-16 NOTE — Progress Notes (Signed)
Subjective:    Patient ID: [name], female    DOB: June 30, 1926, 81 y.o.   MRN: [id number]  HPI Here for f/u of HTN    Also having trouble with dry skin/ rash behind both ears - tried many things otc -no help Itches/ rubs it  Not in scalp yet - seems to be close  No pain  No drainage   Last visit inc losartan to 100-25 mg  bp is stable today  No cp or palpitations or headaches or edema  No side effects to medicines  BP Readings from Last 3 Encounters:  05/16/14 142/72  03/29/14 150/90  02/15/14 152/92     Feeling pretty good overall   Was a bit dizzy once in a while  Mouth was dry for a bit -improved now (was in the am)   Patient Active Problem List   Diagnosis Date Noted  . Scalp abrasion 02/15/2014  . Left hip pain 02/15/2014  . Spinal stenosis of lumbar region 02/15/2014  . Right foot pain 09/23/2013  . Right shoulder pain 09/23/2013  . Other screening mammogram 04/12/2013  . Dizziness and giddiness 05/21/2012  . Falls frequently 05/21/2012  . Urge incontinence 12/26/2011  . Chest pain 09/15/2011  . Hip pain, bilateral 09/15/2011  . Special screening for malignant neoplasms, colon 07/10/2010  . Chest wall pain 07/08/2010  . HYPOTHYROIDISM 04/30/2010  . CONSTIPATION 04/30/2010  . STRESS REACTION, ACUTE, WITH EMOTIONAL DISTURBANCE 10/09/2009  . HYPERGLYCEMIA 07/22/2009  . NEOPLASM, MALIGNANT, BREAST 12/29/2007  . LEG PAIN, BILATERAL 07/01/2007  . BACK PAIN 06/22/2006  . PLUMMER'S DISEASE 06/16/2006  . HYPERLIPIDEMIA 06/16/2006  . ANXIETY 06/16/2006  . Essential hypertension 06/16/2006  . OSTEOPENIA 06/16/2006   Past Medical History  Diagnosis Date  . Anxiety 12/2002  . Hyperlipidemia 11/2004  . Hypertension 12/2002  . Spinal stenosis   . Osteopenia 02/2002    02/2002 Dexa osteopenia// Dexa 02/26/2004 (Dr. Ubaldo Glassing) stable/IMR femur/spine  . Diabetes mellitus 11/2004    Type 2  . 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 Ronnald Collum endocrinologist  . Cancer     left breast cancer   Past Surgical History  Procedure Laterality Date  . Abdominal hysterectomy  1990    endometrial adenoca  . Breast surgery  1980    left breast lumpectomy //abscess of breast evac.  Marland Kitchen Cholecystectomy  07/10/1987  . Eye surgery      Tear Duct surgery   History  Substance Use Topics  . Smoking status: Never Smoker   . Smokeless tobacco: Never Used  . Alcohol Use: No   Family History  Problem Relation Age of Onset  . Alzheimer's disease Mother   . Heart disease Mother 63    angina  . Hypertension Mother   . Crohn's disease Mother     ? crohns disease  . Cancer Father 21    colon cancer  . Cancer Sister 17    breast cancer   . COPD Sister     emphysema (smoker)  . Heart disease Sister     angina  . Alcohol abuse Brother   . Diabetes Paternal Aunt   . Stroke Neg Hx   . Hypertension Sister    Allergies  Allergen Reactions  . Amlodipine     Leg edema   . Atenolol     REACTION: bradycardia  . Erythromycin Base     REACTION: stomach upset  . Lisinopril  REACTION: cough  (20 mg)   Current Outpatient Prescriptions on File Prior to Visit  Medication Sig Dispense Refill  . aspirin 81 MG tablet Take 81 mg by mouth daily. Daily with food     . cholecalciferol (VITAMIN D) 1000 UNITS tablet Take 1,000 Units by mouth daily.      Marland Kitchen gabapentin (NEURONTIN) 100 MG capsule TAKE 1 CAPSULE (100 MG TOTAL) BY MOUTH 2 (TWO) TIMES DAILY. 60 capsule 5  . levothyroxine (SYNTHROID, LEVOTHROID) 50 MCG tablet TAKE 1 TABLET BY MOUTH DAILY 30 tablet 5  . losartan-hydrochlorothiazide (HYZAAR) 100-25 MG per tablet Take 1 tablet by mouth daily. 30 tablet 11  . meclizine (ANTIVERT) 25 MG tablet Take 25 mg by mouth 2 (two) times daily as needed for dizziness.    . polyethylene glycol powder (GLYCOLAX/MIRALAX) powder Take by mouth daily as needed. As directed for constipation     . vitamin C (ASCORBIC ACID) 500 MG tablet Take 500 mg  by mouth daily.     No current facility-administered medications on file prior to visit.    Review of Systems  Review of Systems  Constitutional: Negative for fever, appetite change, fatigue and unexpected weight change.  Eyes: Negative for pain and visual disturbance.  Respiratory: Negative for cough and shortness of breath.   Cardiovascular: Negative for cp or palpitations    Gastrointestinal: Negative for nausea, diarrhea and constipation.  Genitourinary: Negative for urgency and frequency.  Skin: Negative for pallor and pos for dry skin rash behind ears   Neurological: Negative for weakness, light-headedness, numbness and headaches.  Hematological: Negative for adenopathy. Does not bruise/bleed easily.  Psychiatric/Behavioral: Negative for dysphoric mood. The patient is not nervous/anxious.         Objective:   Physical Exam  Constitutional: She appears well-developed and well-nourished. No distress.  HENT:  Head: Normocephalic and atraumatic.  Mouth/Throat: Oropharynx is clear and moist.  Eyes: Conjunctivae and EOM are normal. Pupils are equal, round, and reactive to light. Right eye exhibits no discharge. Left eye exhibits no discharge.  Neck: Normal range of motion. Neck supple. No JVD present. Carotid bruit is not present. No thyromegaly present.  Cardiovascular: Normal rate, regular rhythm, normal heart sounds and intact distal pulses.   Pulmonary/Chest: Effort normal and breath sounds normal. No respiratory distress. She has no wheezes. She has no rales.  Musculoskeletal: She exhibits no edema.  Lymphadenopathy:    She has no cervical adenopathy.  Neurological: She is alert. She has normal reflexes.  Skin: Skin is warm and dry. Rash noted. No erythema. No pallor.  Dry skin with flaking/scale behind both ears -worse on R No open areas   No rash in scalp    Psychiatric: She has a normal mood and affect.          Assessment & Plan:   Problem List Items Addressed  This Visit      Cardiovascular and Mediastinum   Essential hypertension    Improved with current medication bp in fair control at this time  BP Readings from Last 1 Encounters:  05/16/14 130/70   No changes needed Disc lifstyle change with low sodium diet and exercise   Lab today       Relevant Orders   Basic metabolic panel     Musculoskeletal and Integument   Eczema - Primary    Behind ears primarily  seb derm is also in the differential / less likely early psoriasis  Px elocon cream to use prn  Will  update if no improvement

## 2014-05-17 LAB — BASIC METABOLIC PANEL
BUN: 20 mg/dL (ref 6–23)
CHLORIDE: 105 meq/L (ref 96–112)
CO2: 28 meq/L (ref 19–32)
CREATININE: 0.87 mg/dL (ref 0.40–1.20)
Calcium: 9.6 mg/dL (ref 8.4–10.5)
GFR: 65.7 mL/min (ref >60.00)
Glucose, Bld: 108 mg/dL — ABNORMAL HIGH (ref 70–99)
POTASSIUM: 3.9 meq/L (ref 3.5–5.1)
SODIUM: 139 meq/L (ref 135–145)

## 2014-05-17 NOTE — Assessment & Plan Note (Signed)
Improved with current medication bp in fair control at this time  BP Readings from Last 1 Encounters:  05/16/14 130/70   No changes needed Disc lifstyle change with low sodium diet and exercise   Lab today

## 2014-05-17 NOTE — Assessment & Plan Note (Signed)
Behind ears primarily  seb derm is also in the differential / less likely early psoriasis  Px elocon cream to use prn  Will update if no improvement

## 2014-05-18 ENCOUNTER — Telehealth: Payer: Self-pay

## 2014-05-18 NOTE — Telephone Encounter (Signed)
Patient informed of lab results. 

## 2014-05-18 NOTE — Telephone Encounter (Signed)
-----   Message from Abner Greenspan, MD sent at 05/18/2014  8:48 AM EST ----- Labs look ok

## 2014-06-01 ENCOUNTER — Ambulatory Visit: Payer: Self-pay | Admitting: Family Medicine

## 2014-06-01 DIAGNOSIS — Z1231 Encounter for screening mammogram for malignant neoplasm of breast: Secondary | ICD-10-CM | POA: Diagnosis not present

## 2014-06-05 ENCOUNTER — Encounter: Payer: Self-pay | Admitting: Family Medicine

## 2014-06-06 ENCOUNTER — Telehealth: Payer: Self-pay

## 2014-06-06 NOTE — Telephone Encounter (Signed)
Patient informed of normal mammogram and recommendations.

## 2014-06-06 NOTE — Telephone Encounter (Signed)
-----   Message from Abner Greenspan, MD sent at 06/05/2014 10:01 PM EDT ----- Mammogram is normal  Please note for flow sheet if you can  Due for next screening mammogram in 1 year

## 2014-07-14 DIAGNOSIS — H2513 Age-related nuclear cataract, bilateral: Secondary | ICD-10-CM | POA: Diagnosis not present

## 2014-07-26 ENCOUNTER — Other Ambulatory Visit: Payer: Self-pay | Admitting: Family Medicine

## 2014-07-26 NOTE — Telephone Encounter (Signed)
Please refill for a year  

## 2014-07-26 NOTE — Telephone Encounter (Signed)
Electronic refill request, pt had a recent f/u appt on 05/16/14, last refilled on 10/28/13 #60 with 5 additional refills, please advise

## 2014-07-27 NOTE — Telephone Encounter (Signed)
done

## 2014-09-04 ENCOUNTER — Other Ambulatory Visit: Payer: Self-pay | Admitting: Family Medicine

## 2014-12-25 ENCOUNTER — Other Ambulatory Visit: Payer: Self-pay | Admitting: Family Medicine

## 2015-01-09 ENCOUNTER — Encounter: Payer: Self-pay | Admitting: Sports Medicine

## 2015-01-09 ENCOUNTER — Ambulatory Visit (INDEPENDENT_AMBULATORY_CARE_PROVIDER_SITE_OTHER): Payer: Medicare Other | Admitting: Sports Medicine

## 2015-01-09 DIAGNOSIS — B351 Tinea unguium: Secondary | ICD-10-CM | POA: Diagnosis not present

## 2015-01-09 DIAGNOSIS — M79675 Pain in left toe(s): Secondary | ICD-10-CM | POA: Diagnosis not present

## 2015-01-09 DIAGNOSIS — M79674 Pain in right toe(s): Secondary | ICD-10-CM | POA: Diagnosis not present

## 2015-01-09 NOTE — Progress Notes (Signed)
Patient ID: [name], female   DOB: May 23, 1926, 81 y.o.   MRN: [id number] Subjective: [name] is a 81 y.o. female patient seen today in office with complaint of painful thickened and elongated toenails; unable to trim. States it feels like her nails are curling under. Patient denies history of Diabetes, Neuropathy, or Vascular disease. Patient has no other pedal complaints at this time.   Review of Systems  Constitutional: Positive for activity change and fatigue.       Sweating   HENT:       Sinus problems  Ringing in ears  Eyes: Positive for redness, itching and visual disturbance.  Respiratory: Positive for chest tightness and shortness of breath.   Gastrointestinal: Positive for diarrhea and constipation.  Neurological: Positive for dizziness, weakness, light-headedness and numbness.  Hematological: Bruises/bleeds easily.  All other systems reviewed and are negative.  Patient Active Problem List   Diagnosis Date Noted  . Eczema 05/16/2014  . Scalp abrasion 02/15/2014  . Left hip pain 02/15/2014  . Spinal stenosis of lumbar region 02/15/2014  . Right foot pain 09/23/2013  . Right shoulder pain 09/23/2013  . Other screening mammogram 04/12/2013  . Dizziness and giddiness 05/21/2012  . Falls frequently 05/21/2012  . Urge incontinence 12/26/2011  . Chest pain 09/15/2011  . Hip pain, bilateral 09/15/2011  . Special screening for malignant neoplasms, colon 07/10/2010  . Chest wall pain 07/08/2010  . HYPOTHYROIDISM 04/30/2010  . CONSTIPATION 04/30/2010  . STRESS REACTION, ACUTE, WITH EMOTIONAL DISTURBANCE 10/09/2009  . HYPERGLYCEMIA 07/22/2009  . NEOPLASM, MALIGNANT, BREAST 12/29/2007  . LEG PAIN, BILATERAL 07/01/2007  . BACK PAIN 06/22/2006  . PLUMMER'S DISEASE 06/16/2006  . HYPERLIPIDEMIA 06/16/2006  . ANXIETY 06/16/2006  . Essential hypertension 06/16/2006  . OSTEOPENIA 06/16/2006   Current Outpatient Prescriptions on File Prior to Visit  Medication Sig  Dispense Refill  . aspirin 81 MG tablet Take 81 mg by mouth daily. Daily with food     . cholecalciferol (VITAMIN D) 1000 UNITS tablet Take 1,000 Units by mouth daily.      Marland Kitchen gabapentin (NEURONTIN) 100 MG capsule TAKE ONE (1) CAPSULE BY MOUTH 2 TIMES DAILY 60 capsule 11  . levothyroxine (SYNTHROID, LEVOTHROID) 50 MCG tablet TAKE 1 TABLET BY MOUTH DAILY 30 tablet 3  . losartan-hydrochlorothiazide (HYZAAR) 100-25 MG per tablet Take 1 tablet by mouth daily. 30 tablet 11  . mometasone (ELOCON) 0.1 % cream Apply 1 application topically daily. To affected areas behind ears 30 g 1  . polyethylene glycol powder (GLYCOLAX/MIRALAX) powder Take by mouth daily as needed. As directed for constipation     . vitamin C (ASCORBIC ACID) 500 MG tablet Take 500 mg by mouth daily.     No current facility-administered medications on file prior to visit.   Allergies  Allergen Reactions  . Amlodipine     Leg edema   . Atenolol     REACTION: bradycardia  . Erythromycin Base     REACTION: stomach upset  . Lisinopril     REACTION: cough  (20 mg)    Objective: Physical Exam  General: Well developed, nourished, no acute distress, awake, alert and oriented x 3, cane assisted gait  Vascular: Dorsalis pedis artery 1/4 bilateral, Posterior tibial artery 1/4 bilateral, skin temperature warm to cool proximal to distal bilateral lower extremities, + varicosities, Scant hair present bilateral.  Neurological: Gross sensation present via light touch bilateral. Protective and epicritic with SWMF intact. Vibratory sensation intact bilateral.   Dermatological: Skin  is dry, and supple bilateral, Nails 1-10 are tender, long, thick, and discolored with mild subungal debris, no webspace macerations present bilateral, no open lesions present bilateral, no callus/corns/hyperkeratotic tissue present bilateral. No signs of infection bilateral.  Musculoskeletal: Rigid asymptomatic hammertoes deformities R>L noted. Muscular strength  within normal limits without pain or limitation on range of motion. No pain with calf compression bilateral.  Assessment and Plan:  Problem List Items Addressed This Visit      Other   LEG PAIN, BILATERAL    Other Visit Diagnoses    Dermatophytosis of nail    -  Primary      -Examined patient.  -Discussed treatment options for painful mycotic nails. -Mechanically debrided and reduced mycotic nails with sterile nail nipper and dremel nail file without incident. -Recommend good supportive shoes daily.  -Patient to return as needed for follow up evaluation or sooner if symptoms worsen.  Landis Martins, DPM

## 2015-01-09 NOTE — Progress Notes (Deleted)
   Subjective:    Patient ID: [name], female    DOB: 10-16-1926, 81 y.o.   MRN: [id number]  HPI    Review of Systems  Constitutional: Positive for activity change and fatigue.       Sweating   HENT:       Sinus problems  Ringing in ears  Eyes: Positive for redness, itching and visual disturbance.  Respiratory: Positive for chest tightness and shortness of breath.   Gastrointestinal: Positive for diarrhea and constipation.  Neurological: Positive for dizziness, weakness, light-headedness and numbness.  Hematological: Bruises/bleeds easily.  All other systems reviewed and are negative.      Objective:   Physical Exam        Assessment & Plan:

## 2015-03-16 ENCOUNTER — Other Ambulatory Visit: Payer: Self-pay | Admitting: *Deleted

## 2015-03-16 MED ORDER — GABAPENTIN 100 MG PO CAPS: ORAL_CAPSULE | ORAL | Status: DC

## 2015-03-16 MED ORDER — LOSARTAN POTASSIUM-HCTZ 100-25 MG PO TABS: 1.0000 | ORAL_TABLET | Freq: Every day | ORAL | Status: DC

## 2015-03-16 NOTE — Addendum Note (Signed)
Addended by: Tammi Sou on: 03/16/2015 04:06 PM   Modules accepted: Orders

## 2015-03-19 MED ORDER — GABAPENTIN 100 MG PO CAPS: ORAL_CAPSULE | ORAL | Status: DC

## 2015-03-19 MED ORDER — LOSARTAN POTASSIUM-HCTZ 100-25 MG PO TABS: 1.0000 | ORAL_TABLET | Freq: Every day | ORAL | Status: DC

## 2015-03-19 NOTE — Telephone Encounter (Signed)
Received fax saying Rxs need to go to [facility], done

## 2015-03-19 NOTE — Addendum Note (Signed)
Addended by: Tammi Sou on: 03/19/2015 04:57 PM   Modules accepted: Orders

## 2015-05-01 ENCOUNTER — Ambulatory Visit (INDEPENDENT_AMBULATORY_CARE_PROVIDER_SITE_OTHER): Payer: Medicare Other | Admitting: Family Medicine

## 2015-05-01 ENCOUNTER — Encounter: Payer: Self-pay | Admitting: Family Medicine

## 2015-05-01 VITALS — BP 151/90 | HR 76 | Temp 98.0°F | Wt 145.0 lb

## 2015-05-01 DIAGNOSIS — R7309 Other abnormal glucose: Secondary | ICD-10-CM | POA: Diagnosis not present

## 2015-05-01 DIAGNOSIS — E039 Hypothyroidism, unspecified: Secondary | ICD-10-CM

## 2015-05-01 DIAGNOSIS — I10 Essential (primary) hypertension: Secondary | ICD-10-CM

## 2015-05-01 DIAGNOSIS — Z23 Encounter for immunization: Secondary | ICD-10-CM

## 2015-05-01 DIAGNOSIS — E785 Hyperlipidemia, unspecified: Secondary | ICD-10-CM | POA: Diagnosis not present

## 2015-05-01 MED ORDER — LEVOTHYROXINE SODIUM 50 MCG PO TABS: 50.0000 ug | ORAL_TABLET | Freq: Every day | ORAL | Status: DC

## 2015-05-01 NOTE — Assessment & Plan Note (Signed)
Lipid panel today Disc low sat fat diet when able

## 2015-05-01 NOTE — Progress Notes (Signed)
Subjective:    Patient ID: [name], female    DOB: [birth year], 82 y.o.   MRN: [id number]  HPI  Here for f/u of chronic medical problems  Got very anxious on the way here- stuck in traffic -also upset with family before she came  BP Readings from Last 3 Encounters:  05/01/15 184/96  05/16/14 130/70  03/29/14 150/90   had not checked bp at home lately No cp or ha -feels pretty good  Takes hyzaar    Wt is down 7 lb with bmi of 27  Eating healthy Gets meals on wheels  Family cooks for her and brings it to her  Healthy food for the most part   Hypothyroidism  Pt has no clinical changes No change in energy level/ hair or skin/ edema and no tremor Hair is thinner -this has been for a long time  Lab Results  Component Value Date   TSH 2.32 01/03/2014    Due for a check today   Hx of elevated blood sugar Lab Results  Component Value Date   HGBA1C 5.9 07/17/2009    Hx of hyperlipidemia Lab Results  Component Value Date   CHOL 177 09/02/2013   HDL 56.30 09/02/2013   LDLCALC 99 09/02/2013   TRIG 109.0 09/02/2013   CHOLHDL 3 09/02/2013     Patient Active Problem List   Diagnosis Date Noted  . Eczema 05/16/2014  . Scalp abrasion 02/15/2014  . Left hip pain 02/15/2014  . Spinal stenosis of lumbar region 02/15/2014  . Right foot pain 09/23/2013  . Right shoulder pain 09/23/2013  . Other screening mammogram 04/12/2013  . Dizziness and giddiness 05/21/2012  . Falls frequently 05/21/2012  . Urge incontinence 12/26/2011  . Chest pain 09/15/2011  . Hip pain, bilateral 09/15/2011  . Special screening for malignant neoplasms, colon 07/10/2010  . Chest wall pain 07/08/2010  . Hypothyroidism 04/30/2010  . CONSTIPATION 04/30/2010  . STRESS REACTION, ACUTE, WITH EMOTIONAL DISTURBANCE 10/09/2009  . HYPERGLYCEMIA 07/22/2009  . NEOPLASM, MALIGNANT, BREAST 12/29/2007  . LEG PAIN, BILATERAL 07/01/2007  . BACK PAIN 06/22/2006  . PLUMMER'S DISEASE 06/16/2006  .  Hyperlipidemia 06/16/2006  . ANXIETY 06/16/2006  . Essential hypertension 06/16/2006  . OSTEOPENIA 06/16/2006   Past Medical History  Diagnosis Date  . Anxiety 12/2002  . Hyperlipidemia 11/2004  . Hypertension 12/2002  . Spinal stenosis   . Osteopenia 02/2002    02/2002 Dexa osteopenia// Dexa 02/26/2004 (Dr. Ubaldo Glassing) stable/IMR femur/spine  . Diabetes mellitus 11/2004    Type 2  . 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 Ronnald Collum endocrinologist  . Cancer [facility])     left breast cancer   Past Surgical History  Procedure Laterality Date  . Abdominal hysterectomy  1990    endometrial adenoca  . Breast surgery  1980    left breast lumpectomy //abscess of breast evac.  Marland Kitchen Cholecystectomy  07/10/1987  . Eye surgery      Tear Duct surgery   Social History  Substance Use Topics  . Smoking status: Never Smoker   . Smokeless tobacco: Never Used  . Alcohol Use: No   Family History  Problem Relation Age of Onset  . Alzheimer's disease Mother   . Heart disease Mother 31    angina  . Hypertension Mother   . Crohn's disease Mother     ? crohns disease  . Cancer Father 69    colon cancer  . Cancer Sister 46  breast cancer   . COPD Sister     emphysema (smoker)  . Heart disease Sister     angina  . Alcohol abuse Brother   . Diabetes Paternal Aunt   . Stroke Neg Hx   . Hypertension Sister    Allergies  Allergen Reactions  . Amlodipine     Leg edema   . Atenolol     REACTION: bradycardia  . Erythromycin Base     REACTION: stomach upset  . Lisinopril     REACTION: cough  (20 mg)   Current Outpatient Prescriptions on File Prior to Visit  Medication Sig Dispense Refill  . aspirin 81 MG tablet Take 81 mg by mouth daily. Daily with food     . cholecalciferol (VITAMIN D) 1000 UNITS tablet Take 1,000 Units by mouth daily.      Marland Kitchen gabapentin (NEURONTIN) 100 MG capsule TAKE ONE (1) CAPSULE BY MOUTH 2 TIMES DAILY 180 capsule 1  .  losartan-hydrochlorothiazide (HYZAAR) 100-25 MG tablet Take 1 tablet by mouth daily. 90 tablet 1  . mometasone (ELOCON) 0.1 % cream Apply 1 application topically daily. To affected areas behind ears 30 g 1  . polyethylene glycol powder (GLYCOLAX/MIRALAX) powder Take by mouth daily as needed. As directed for constipation     . vitamin C (ASCORBIC ACID) 500 MG tablet Take 500 mg by mouth daily.     No current facility-administered medications on file prior to visit.    Review of Systems Review of Systems  Constitutional: Negative for fever, appetite change, fatigue and unexpected weight change.  Eyes: Negative for pain and visual disturbance.  Respiratory: Negative for cough and shortness of breath.   Cardiovascular: Negative for cp or palpitations    Gastrointestinal: Negative for nausea, diarrhea and constipation.  Genitourinary: Negative for urgency and frequency.  Skin: Negative for pallor or rash   Neurological: Negative for weakness, light-headedness, numbness and headaches.  Hematological: Negative for adenopathy. Does not bruise/bleed easily.  Psychiatric/Behavioral: Negative for dysphoric mood. Pos for anxiety and stressors        Objective:   Physical Exam  Constitutional: She appears well-developed and well-nourished. No distress.  overwt and well appearing  Anxious   HENT:  Head: Normocephalic and atraumatic.  Mouth/Throat: Oropharynx is clear and moist.  Eyes: Conjunctivae and EOM are normal. Pupils are equal, round, and reactive to light.  Neck: Normal range of motion. Neck supple. No JVD present. Carotid bruit is not present. No thyromegaly present.  Cardiovascular: Normal rate, regular rhythm, normal heart sounds and intact distal pulses.  Exam reveals no gallop.   Pulmonary/Chest: Effort normal and breath sounds normal. No respiratory distress. She has no wheezes. She has no rales.  No crackles  Abdominal: Soft. Bowel sounds are normal. She exhibits no distension, no  abdominal bruit and no mass. There is no tenderness.  Genitourinary:  L breast absent s/p mastectomy  Musculoskeletal: She exhibits no edema.  Lymphadenopathy:    She has no cervical adenopathy.  Neurological: She is alert. She has normal reflexes.  Skin: Skin is warm and dry. No rash noted.  Psychiatric: Her mood appears anxious. Thought content is not paranoid. She expresses no homicidal and no suicidal ideation.  Anxious and repeats herself occasionally           Assessment & Plan:   Problem List Items Addressed This Visit      Cardiovascular and Mediastinum   Essential hypertension - Primary    BP: (!) 151/90 mmHg  Pt claims she is compliant with her medicine and was very nervous coming here because her son was running late driving her and his girlfriend was very upset with both of them  Sounds like a poor social situation  She states she does not know when she can get a ride again to come back and she has no one but him  I emph imp of f/u within a month to check this - and to bring her son with her (I would like to ask him about her memory as well re: med compliance) Lab today  Will rev at f/u  She voiced understanding DASH diet is optimal- others make her meals and she may not have good control over what she eats  Has lost 7 lb with smaller portions       Relevant Orders   CBC with Differential/Platelet   Comprehensive metabolic panel   TSH   Lipid panel     Endocrine   Hypothyroidism    TSH today  Adj med if needed No clinical changes except wt loss       Relevant Medications   levothyroxine (SYNTHROID, LEVOTHROID) 50 MCG tablet   Other Relevant Orders   TSH     Other   HYPERGLYCEMIA    A1C today  Pt has little control of diet- people cook for her       Relevant Orders   Hemoglobin A1c   Hyperlipidemia    Lipid panel today Disc low sat fat diet when able       Relevant Orders   Lipid panel    Other Visit Diagnoses    Need for  prophylactic vaccination against Streptococcus pneumoniae (pneumococcus) and influenza        Relevant Orders    Flu Vaccine QUAD 36+ mos PF IM (Fluarix & Fluzone Quad PF) (Completed)    Pneumococcal conjugate vaccine 13-valent IM (Completed)

## 2015-05-01 NOTE — Assessment & Plan Note (Signed)
TSH today  Adj med if needed No clinical changes except wt loss

## 2015-05-01 NOTE — Assessment & Plan Note (Signed)
A1C today  Pt has little control of diet- people cook for her

## 2015-05-01 NOTE — Assessment & Plan Note (Signed)
BP: (!) 151/90 mmHg    Pt claims she is compliant with her medicine and was very nervous coming here because her son was running late driving her and his girlfriend was very upset with both of them  Sounds like a poor social situation  She states she does not know when she can get a ride again to come back and she has no one but him  I emph imp of f/u within a month to check this - and to bring her son with her (I would like to ask him about her memory as well re: med compliance) Lab today  Will rev at f/u  She voiced understanding DASH diet is optimal- others make her meals and she may not have good control over what she eats  Has lost 7 lb with smaller portions

## 2015-05-01 NOTE — Patient Instructions (Addendum)
Labs today  Flu shot and pneumonia vaccine today  Your blood pressure is quite high - possibly because you were anxious about getting here today  PLEASE MAKE AN APPOINTMENT TO COME BACK IN A MONTH TO RE CHECK THIS - if it is not improved we may need to add or change medicine Start checking your blood pressure at home if you can  We will review your labs then also    Please have your son come back in the room with you for your next visit so I can get him up to date on your medical status  Thanks!

## 2015-05-01 NOTE — Progress Notes (Signed)
Pre visit review using our clinic review tool, if applicable. No additional management support is needed unless otherwise documented below in the visit note. 

## 2015-05-02 LAB — CBC WITH DIFFERENTIAL/PLATELET
Basophils Absolute: 0 10*3/uL (ref 0.0–0.1)
Basophils Relative: 0.1 % (ref 0.0–3.0)
EOS ABS: 0.3 10*3/uL (ref 0.0–0.7)
Eosinophils Relative: 3.2 % (ref 0.0–5.0)
HEMATOCRIT: 38.7 % (ref 36.0–46.0)
HEMOGLOBIN: 12.9 g/dL (ref 12.0–15.0)
LYMPHS ABS: 1.7 10*3/uL (ref 0.7–4.0)
Lymphocytes Relative: 17.2 % (ref 12.0–46.0)
MCHC: 33.5 g/dL (ref 30.0–36.0)
MCV: 90.2 fl (ref 78.0–100.0)
MONO ABS: 0.7 10*3/uL (ref 0.1–1.0)
Monocytes Relative: 7.2 % (ref 3.0–12.0)
NEUTROS PCT: 72.3 % (ref 43.0–77.0)
Neutro Abs: 7.2 10*3/uL (ref 1.4–7.7)
Platelets: 367 10*3/uL (ref 150.0–400.0)
RBC: 4.29 Mil/uL (ref 3.87–5.11)
RDW: 15 % (ref 11.5–15.5)
WBC: 10 10*3/uL (ref 4.0–10.5)

## 2015-05-02 LAB — COMPREHENSIVE METABOLIC PANEL
ALBUMIN: 4.2 g/dL (ref 3.5–5.2)
ALK PHOS: 80 U/L (ref 39–117)
ALT: 13 U/L (ref 0–35)
AST: 19 U/L (ref 0–37)
BILIRUBIN TOTAL: 0.5 mg/dL (ref 0.2–1.2)
BUN: 18 mg/dL (ref 6–23)
CO2: 29 mEq/L (ref 19–32)
Calcium: 9.7 mg/dL (ref 8.4–10.5)
Chloride: 106 mEq/L (ref 96–112)
Creatinine, Ser: 0.9 mg/dL (ref 0.40–1.20)
GFR: 63.04 mL/min (ref >60.00)
GLUCOSE: 107 mg/dL — AB (ref 70–99)
POTASSIUM: 3.9 meq/L (ref 3.5–5.1)
Sodium: 142 mEq/L (ref 135–145)
TOTAL PROTEIN: 7.4 g/dL (ref 6.0–8.3)

## 2015-05-02 LAB — LIPID PANEL
CHOL/HDL RATIO: 3
Cholesterol: 168 mg/dL (ref 0–200)
HDL: 53.5 mg/dL (ref >39.00)
LDL Cholesterol: 97 mg/dL (ref 0–99)
NONHDL: 114.77
TRIGLYCERIDES: 89 mg/dL (ref 0.0–149.0)
VLDL: 17.8 mg/dL (ref 0.0–40.0)

## 2015-05-02 LAB — TSH: TSH: 1.52 u[IU]/mL (ref 0.35–4.50)

## 2015-05-02 LAB — HEMOGLOBIN A1C: Hgb A1c MFr Bld: 6.1 % (ref 4.6–6.5)

## 2015-05-04 ENCOUNTER — Encounter: Payer: Self-pay | Admitting: *Deleted

## 2015-06-01 ENCOUNTER — Encounter: Payer: Self-pay | Admitting: Family Medicine

## 2015-06-01 ENCOUNTER — Ambulatory Visit (INDEPENDENT_AMBULATORY_CARE_PROVIDER_SITE_OTHER): Payer: Medicare Other | Admitting: Family Medicine

## 2015-06-01 VITALS — BP 125/80 | HR 74 | Temp 90.0°F | Ht 60.5 in | Wt 145.5 lb

## 2015-06-01 DIAGNOSIS — E785 Hyperlipidemia, unspecified: Secondary | ICD-10-CM

## 2015-06-01 DIAGNOSIS — E039 Hypothyroidism, unspecified: Secondary | ICD-10-CM | POA: Diagnosis not present

## 2015-06-01 DIAGNOSIS — R7309 Other abnormal glucose: Secondary | ICD-10-CM | POA: Diagnosis not present

## 2015-06-01 DIAGNOSIS — I10 Essential (primary) hypertension: Secondary | ICD-10-CM

## 2015-06-01 NOTE — Progress Notes (Signed)
Pre visit review using our clinic review tool, if applicable. No additional management support is needed unless otherwise documented below in the visit note. 

## 2015-06-01 NOTE — Progress Notes (Signed)
Subjective:    Patient ID: [name], female    DOB: 25-May-1926, 82 y.o.   MRN: [id number]  HPI Here for f/u of chronic medical problems   Feeling better today   bp is improved today  No cp or palpitations or headaches or edema  No side effects to medicines  BP Readings from Last 3 Encounters:  06/01/15 136/78  05/01/15 151/90  05/16/14 130/70     On hyzaar and DASH diet  She says she takes her medicine regularly   Hyperglycemia Lab Results  Component Value Date   HGBA1C 6.1 05/01/2015  no sweet drinks  Has sweet tea occ - rarely  Eats a lot of snacks- some sweets  This is up from 5.9  Hypothyroidism  Pt has no clinical changes No change in energy level/ hair or skin/ edema and no tremor Lab Results  Component Value Date   TSH 1.52 05/01/2015       Chemistry      Component Value Date/Time   NA 142 05/01/2015 1709   NA 140 02/04/2014 1652   NA 139 11/15/2009 1041   K 3.9 05/01/2015 1709   K 3.9 02/04/2014 1652   K 3.9 11/15/2009 1041   CL 106 05/01/2015 1709   CL 106 02/04/2014 1652   CL 101 11/15/2009 1041   CO2 29 05/01/2015 1709   CO2 29 02/04/2014 1652   CO2 28 11/15/2009 1041   BUN 18 05/01/2015 1709   BUN 15 02/04/2014 1652   BUN 19 11/15/2009 1041   CREATININE 0.90 05/01/2015 1709   CREATININE 0.92 02/04/2014 1652   CREATININE 0.7 11/15/2009 1041      Component Value Date/Time   CALCIUM 9.7 05/01/2015 1709   CALCIUM 8.9 02/04/2014 1652   CALCIUM 10.1 11/15/2009 1041   ALKPHOS 80 05/01/2015 1709   ALKPHOS 97 02/04/2014 1652   ALKPHOS 84 11/15/2009 1041   AST 19 05/01/2015 1709   AST 18 02/04/2014 1652   AST 24 11/15/2009 1041   ALT 13 05/01/2015 1709   ALT 24 02/04/2014 1652   ALT 22 11/15/2009 1041   BILITOT 0.5 05/01/2015 1709   BILITOT 0.3 02/04/2014 1652   BILITOT 0.60 11/15/2009 1041      Lab Results  Component Value Date   WBC 10.0 05/01/2015   HGB 12.9 05/01/2015   HCT 38.7 05/01/2015   MCV 90.2 05/01/2015   PLT  367.0 05/01/2015     Cholesterol Lab Results  Component Value Date   CHOL 168 05/01/2015   HDL 53.50 05/01/2015   LDLCALC 97 05/01/2015   TRIG 89.0 05/01/2015   CHOLHDL 3 05/01/2015   very good profile   Patient Active Problem List   Diagnosis Date Noted  . Eczema 05/16/2014  . Left hip pain 02/15/2014  . Spinal stenosis of lumbar region 02/15/2014  . Right foot pain 09/23/2013  . Right shoulder pain 09/23/2013  . Other screening mammogram 04/12/2013  . Falls frequently 05/21/2012  . Urge incontinence 12/26/2011  . Hip pain, bilateral 09/15/2011  . Special screening for malignant neoplasms, colon 07/10/2010  . Hypothyroidism 04/30/2010  . CONSTIPATION 04/30/2010  . STRESS REACTION, ACUTE, WITH EMOTIONAL DISTURBANCE 10/09/2009  . HYPERGLYCEMIA 07/22/2009  . NEOPLASM, MALIGNANT, BREAST 12/29/2007  . LEG PAIN, BILATERAL 07/01/2007  . BACK PAIN 06/22/2006  . PLUMMER'S DISEASE 06/16/2006  . Hyperlipidemia 06/16/2006  . ANXIETY 06/16/2006  . Essential hypertension 06/16/2006  . OSTEOPENIA 06/16/2006   Past Medical History  Diagnosis Date  .  Anxiety 12/2002  . Hyperlipidemia 11/2004  . Hypertension 12/2002  . Spinal stenosis   . Osteopenia 02/2002    02/2002 Dexa osteopenia// Dexa 02/26/2004 (Dr. Ubaldo Glassing) stable/IMR femur/spine  . Diabetes mellitus 11/2004    Type 2  . 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 Ronnald Collum endocrinologist  . Cancer [facility])     left breast cancer   Past Surgical History  Procedure Laterality Date  . Abdominal hysterectomy  1990    endometrial adenoca  . Breast surgery  1980    left breast lumpectomy //abscess of breast evac.  Marland Kitchen Cholecystectomy  07/10/1987  . Eye surgery      Tear Duct surgery   Social History  Substance Use Topics  . Smoking status: Never Smoker   . Smokeless tobacco: Never Used  . Alcohol Use: No   Family History  Problem Relation Age of Onset  . Alzheimer's disease Mother   . Heart  disease Mother 27    angina  . Hypertension Mother   . Crohn's disease Mother     ? crohns disease  . Cancer Father 24    colon cancer  . Cancer Sister 81    breast cancer   . COPD Sister     emphysema (smoker)  . Heart disease Sister     angina  . Alcohol abuse Brother   . Diabetes Paternal Aunt   . Stroke Neg Hx   . Hypertension Sister    Allergies  Allergen Reactions  . Amlodipine     Leg edema   . Atenolol     REACTION: bradycardia  . Erythromycin Base     REACTION: stomach upset  . Lisinopril     REACTION: cough  (20 mg)   Current Outpatient Prescriptions on File Prior to Visit  Medication Sig Dispense Refill  . aspirin 81 MG tablet Take 81 mg by mouth daily. Daily with food     . cholecalciferol (VITAMIN D) 1000 UNITS tablet Take 1,000 Units by mouth daily.      Marland Kitchen gabapentin (NEURONTIN) 100 MG capsule TAKE ONE (1) CAPSULE BY MOUTH 2 TIMES DAILY 180 capsule 1  . levothyroxine (SYNTHROID, LEVOTHROID) 50 MCG tablet Take 1 tablet (50 mcg total) by mouth daily. 90 tablet 1  . losartan-hydrochlorothiazide (HYZAAR) 100-25 MG tablet Take 1 tablet by mouth daily. 90 tablet 1  . mometasone (ELOCON) 0.1 % cream Apply 1 application topically daily. To affected areas behind ears 30 g 1  . polyethylene glycol powder (GLYCOLAX/MIRALAX) powder Take by mouth daily as needed. As directed for constipation     . vitamin C (ASCORBIC ACID) 500 MG tablet Take 500 mg by mouth daily.     No current facility-administered medications on file prior to visit.    Review of Systems Review of Systems  Constitutional: Negative for fever, appetite change, fatigue and unexpected weight change.  Eyes: Negative for pain and visual disturbance.  Respiratory: Negative for cough and shortness of breath.   Cardiovascular: Negative for cp or palpitations    Gastrointestinal: Negative for nausea, diarrhea and constipation.  Genitourinary: Negative for urgency and frequency.  Skin: Negative for pallor or  rash   Neurological: Negative for weakness, light-headedness, numbness and headaches.  Hematological: Negative for adenopathy. Does not bruise/bleed easily.  Psychiatric/Behavioral: Negative for dysphoric mood. The patient is sometimes nervous/anxious.         Objective:   Physical Exam  Constitutional: She appears well-developed and well-nourished. No  distress.  Well appearing elderly female -mobility impaired from spinal stenosis   HENT:  Head: Normocephalic and atraumatic.  Mouth/Throat: Oropharynx is clear and moist.  Eyes: Conjunctivae and EOM are normal. Pupils are equal, round, and reactive to light.  Neck: Normal range of motion. Neck supple. No JVD present. Carotid bruit is not present. No thyromegaly present.  Cardiovascular: Normal rate, regular rhythm, normal heart sounds and intact distal pulses.  Exam reveals no gallop.   Pulmonary/Chest: Effort normal and breath sounds normal. No respiratory distress. She has no wheezes. She has no rales.  No crackles  Abdominal: Soft. Bowel sounds are normal. She exhibits no distension, no abdominal bruit and no mass. There is no tenderness.  Musculoskeletal: She exhibits no edema.  Lymphadenopathy:    She has no cervical adenopathy.  Neurological: She is alert. She has normal reflexes.  Skin: Skin is warm and dry. No rash noted.  Psychiatric: She has a normal mood and affect.          Assessment & Plan:   Problem List Items Addressed This Visit      Cardiovascular and Mediastinum   Essential hypertension - Primary    bp in fair control at this time -improved (less nervous today) BP Readings from Last 1 Encounters:  06/01/15 125/80   No changes needed Disc lifstyle change with low sodium diet and exercise   Labs reviewed          Endocrine   Hypothyroidism    Hypothyroidism  Pt has no clinical changes No change in energy level/ hair or skin/ edema and no tremor Lab Results  Component Value Date   TSH 1.52  05/01/2015            Other   HYPERGLYCEMIA    A1C is up  Lab Results  Component Value Date   HGBA1C 6.1 05/01/2015   Disc low glycemic diet/ exercise as tol and wt control to avoid diabetes       Hyperlipidemia    Good profile today with diet control  Disc goals for lipids and reasons to control them Rev labs with pt Rev low sat fat diet in detail

## 2015-06-01 NOTE — Patient Instructions (Signed)
No change in medicines  Labs are stable  Watch out for sweets/sugar and limit portions bread/pasta/potato  Try to drink less soda and more water  Take care of yourself

## 2015-06-03 NOTE — Assessment & Plan Note (Signed)
Good profile today with diet control  Disc goals for lipids and reasons to control them Rev labs with pt Rev low sat fat diet in detail

## 2015-06-03 NOTE — Assessment & Plan Note (Signed)
A1C is up  Lab Results  Component Value Date   HGBA1C 6.1 05/01/2015   Disc low glycemic diet/ exercise as tol and wt control to avoid diabetes

## 2015-06-03 NOTE — Assessment & Plan Note (Addendum)
bp in fair control at this time -improved (less nervous today) BP Readings from Last 1 Encounters:  06/01/15 125/80   No changes needed Disc lifstyle change with low sodium diet and exercise   Labs reviewed

## 2015-06-03 NOTE — Assessment & Plan Note (Signed)
Hypothyroidism  Pt has no clinical changes No change in energy level/ hair or skin/ edema and no tremor Lab Results  Component Value Date   TSH 1.52 05/01/2015

## 2015-10-28 ENCOUNTER — Other Ambulatory Visit: Payer: Self-pay | Admitting: Family Medicine

## 2015-11-06 ENCOUNTER — Other Ambulatory Visit: Payer: Self-pay | Admitting: Family Medicine

## 2015-12-29 DIAGNOSIS — Z23 Encounter for immunization: Secondary | ICD-10-CM | POA: Diagnosis not present

## 2016-01-11 ENCOUNTER — Other Ambulatory Visit: Payer: Self-pay | Admitting: Family Medicine

## 2016-01-11 ENCOUNTER — Encounter: Payer: Self-pay | Admitting: *Deleted

## 2016-01-11 NOTE — Telephone Encounter (Signed)
appt scheduled and med refilled 

## 2016-01-11 NOTE — Telephone Encounter (Signed)
Please refill for a year Schedule f/u or annual exam in March (her preference)

## 2016-01-11 NOTE — Telephone Encounter (Signed)
Last f/u was 06/01/15, last filled on 03/19/15 #180 caps with 1 additional refill, please advise

## 2016-01-15 ENCOUNTER — Telehealth: Payer: Self-pay | Admitting: Family Medicine

## 2016-01-15 NOTE — Telephone Encounter (Signed)
Flu shot updated in chart and chart already had shingles vaccine in it

## 2016-01-15 NOTE — Telephone Encounter (Signed)
Patient called and said she received the flu shot at [facility] on 12/29/15 and shingles shot on 07/21/15 at [facility]

## 2016-01-28 ENCOUNTER — Telehealth: Payer: Self-pay | Admitting: Family Medicine

## 2016-01-28 NOTE — Telephone Encounter (Signed)
Spoke to pt. She will call office at the end of February to get scheduled for her AWV with Katha Cabal. Pt has annual appt 05/21/16 with Dr. Glori Bickers.  Pt could not schedule AWV at this time due to not knowing her transportation's schedule in Feb 2018

## 2016-01-29 ENCOUNTER — Other Ambulatory Visit: Payer: Self-pay | Admitting: Family Medicine

## 2016-04-21 ENCOUNTER — Other Ambulatory Visit: Payer: Self-pay | Admitting: Family Medicine

## 2016-05-21 ENCOUNTER — Encounter: Payer: Self-pay | Admitting: Family Medicine

## 2016-05-21 ENCOUNTER — Ambulatory Visit (INDEPENDENT_AMBULATORY_CARE_PROVIDER_SITE_OTHER): Payer: Medicare Other | Admitting: Family Medicine

## 2016-05-21 ENCOUNTER — Encounter (INDEPENDENT_AMBULATORY_CARE_PROVIDER_SITE_OTHER): Payer: Self-pay

## 2016-05-21 VITALS — BP 126/74 | HR 67 | Temp 98.1°F | Ht 60.5 in | Wt 141.5 lb

## 2016-05-21 DIAGNOSIS — E7800 Pure hypercholesterolemia, unspecified: Secondary | ICD-10-CM | POA: Diagnosis not present

## 2016-05-21 DIAGNOSIS — E039 Hypothyroidism, unspecified: Secondary | ICD-10-CM

## 2016-05-21 DIAGNOSIS — R7309 Other abnormal glucose: Secondary | ICD-10-CM

## 2016-05-21 DIAGNOSIS — I10 Essential (primary) hypertension: Secondary | ICD-10-CM

## 2016-05-21 DIAGNOSIS — Z8719 Personal history of other diseases of the digestive system: Secondary | ICD-10-CM | POA: Diagnosis not present

## 2016-05-21 DIAGNOSIS — C50919 Malignant neoplasm of unspecified site of unspecified female breast: Secondary | ICD-10-CM

## 2016-05-21 MED ORDER — GABAPENTIN 100 MG PO CAPS: ORAL_CAPSULE | ORAL | 3 refills | Status: DC

## 2016-05-21 MED ORDER — LEVOTHYROXINE SODIUM 50 MCG PO TABS: 50.0000 ug | ORAL_TABLET | Freq: Every day | ORAL | 3 refills | Status: DC

## 2016-05-21 MED ORDER — LOSARTAN POTASSIUM-HCTZ 100-25 MG PO TABS: 1.0000 | ORAL_TABLET | Freq: Every day | ORAL | 3 refills | Status: DC

## 2016-05-21 NOTE — Progress Notes (Signed)
Subjective:    Patient ID: [name], female    DOB: 11-26-1926, 83 y.o.   MRN: [id number]  HPI Here for f/u of chronic health problems   Has been doing ok in general   Saturday she had rectal bleeding - after several bm  None since  No rectal pain  She does have alt constipation and diarrhea in the past  No abdominal pain  bms since then have been loose without blood    Has hx of hemorrhoids in the past   Hysterectomy in the past    Wt Readings from Last 3 Encounters:  05/21/16 141 lb 8 oz (64.2 kg)  06/01/15 145 lb 8 oz (66 kg)  05/01/15 145 lb (65.8 kg)  down 4 lb  Good diet- meals on wheels - (she avoids a few of the things that bother her gi symptoms) Does not like a lot of green vegetables Appetite is fair  bmi 27.1  bp is stable today  No cp or palpitations or headaches or edema  No side effects to medicines  BP Readings from Last 3 Encounters:  05/21/16 126/74  06/01/15 125/80  05/01/15 (!) 151/90    Takes losartan hct   No falls in the past year   Hx of hyperglycemia Lab Results  Component Value Date   HGBA1C 6.1 05/01/2015   Due for labs   Also due for lipid check   Hypothyroidism  Pt has no clinical changes No change in energy level/ hair or skin/ edema and no tremor Feels the same/ good compliance with her medicines  Lab Results  Component Value Date   TSH 1.52 05/01/2015    Due for labs as well   Patient Active Problem List   Diagnosis Date Noted  . History of rectal bleeding 05/21/2016  . Eczema 05/16/2014  . Left hip pain 02/15/2014  . Spinal stenosis of lumbar region 02/15/2014  . Right foot pain 09/23/2013  . Right shoulder pain 09/23/2013  . Other screening mammogram 04/12/2013  . Falls frequently 05/21/2012  . Urge incontinence 12/26/2011  . Hip pain, bilateral 09/15/2011  . Special screening for malignant neoplasms, colon 07/10/2010  . Hypothyroidism 04/30/2010  . CONSTIPATION 04/30/2010  . STRESS REACTION, ACUTE,  WITH EMOTIONAL DISTURBANCE 10/09/2009  . HYPERGLYCEMIA 07/22/2009  . NEOPLASM, MALIGNANT, BREAST 12/29/2007  . LEG PAIN, BILATERAL 07/01/2007  . BACK PAIN 06/22/2006  . PLUMMER'S DISEASE 06/16/2006  . Hyperlipidemia 06/16/2006  . ANXIETY 06/16/2006  . Essential hypertension 06/16/2006  . OSTEOPENIA 06/16/2006   Past Medical History:  Diagnosis Date  . Anxiety 12/2002  . Cancer [facility])    left breast cancer  . Diabetes mellitus 11/2004   Type 2  . Hyperlipidemia 11/2004  . Hypertension 12/2002  . Osteopenia 02/2002   02/2002 Dexa osteopenia// Dexa 02/26/2004 (Dr. Ubaldo Glassing) stable/IMR femur/spine  . Spinal stenosis   . 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 Ronnald Collum endocrinologist   Past Surgical History:  Procedure Laterality Date  . ABDOMINAL HYSTERECTOMY  1990   endometrial adenoca  . BREAST SURGERY  1980   left breast lumpectomy //abscess of breast evac.  Marland Kitchen CHOLECYSTECTOMY  07/10/1987  . EYE SURGERY     Tear Duct surgery   Social History  Substance Use Topics  . Smoking status: Never Smoker  . Smokeless tobacco: Never Used  . Alcohol use No   Family History  Problem Relation Age of Onset  . Alzheimer's disease Mother   .  Heart disease Mother 32    angina  . Hypertension Mother   . Crohn's disease Mother     ? crohns disease  . Cancer Father 60    colon cancer  . Cancer Sister 28    breast cancer   . COPD Sister     emphysema (smoker)  . Heart disease Sister     angina  . Alcohol abuse Brother   . Diabetes Paternal Aunt   . Stroke Neg Hx   . Hypertension Sister    Allergies  Allergen Reactions  . Amlodipine     Leg edema   . Atenolol     REACTION: bradycardia  . Erythromycin Base     REACTION: stomach upset  . Lisinopril     REACTION: cough  (20 mg)   Current Outpatient Prescriptions on File Prior to Visit  Medication Sig Dispense Refill  . aspirin 81 MG tablet Take 81 mg by mouth daily. Daily with food     .  cholecalciferol (VITAMIN D) 1000 UNITS tablet Take 1,000 Units by mouth daily.      . mometasone (ELOCON) 0.1 % cream Apply 1 application topically daily. To affected areas behind ears 30 g 1  . polyethylene glycol powder (GLYCOLAX/MIRALAX) powder Take by mouth daily as needed. As directed for constipation     . vitamin C (ASCORBIC ACID) 500 MG tablet Take 500 mg by mouth daily.     No current facility-administered medications on file prior to visit.     Review of Systems Review of Systems  Constitutional: Negative for fever, appetite change, fatigue and unexpected weight change.  Eyes: Negative for pain and visual disturbance.  Respiratory: Negative for cough and shortness of breath.   Cardiovascular: Negative for cp or palpitations    Gastrointestinal: Negative for nausea, abd pain or dark stool, pos for intermittent loose and hard stool, pos for recent brb pr that is now resolved  Genitourinary: Negative for urgency and frequency.  Skin: Negative for pallor or rash   Neurological: Negative for weakness, light-headedness, numbness and headaches. Neg for falls in the past year  Hematological: Negative for adenopathy. Does not bruise/bleed easily.  Psychiatric/Behavioral: Negative for dysphoric mood. The patient is not nervous/anxious.         Objective:   Physical Exam  Constitutional: She appears well-developed and well-nourished. No distress.  Well appearing elderly female   HENT:  Head: Normocephalic and atraumatic.  Mouth/Throat: Oropharynx is clear and moist.  Eyes: Conjunctivae and EOM are normal. Pupils are equal, round, and reactive to light. Right eye exhibits no discharge. Left eye exhibits no discharge. No scleral icterus.  Neck: Normal range of motion. Neck supple. No JVD present. Carotid bruit is not present. No thyromegaly present.  Cardiovascular: Normal rate, regular rhythm, normal heart sounds and intact distal pulses.  Exam reveals no gallop.   Pulmonary/Chest:  Effort normal and breath sounds normal. No respiratory distress. She has no wheezes. She has no rales.  No crackles  Abdominal: Soft. Bowel sounds are normal. She exhibits no distension, no abdominal bruit and no mass. There is no tenderness. There is no rebound and no guarding.  Genitourinary:  Genitourinary Comments: Pt declines rectal exam today  Musculoskeletal: She exhibits no edema or tenderness.  Lymphadenopathy:    She has no cervical adenopathy.  Neurological: She is alert. She has normal reflexes. No cranial nerve deficit. She exhibits normal muscle tone. Coordination normal.  No tremor   Skin: Skin is warm and  dry. No rash noted. No pallor.  Psychiatric: She has a normal mood and affect.  Pleasant and talkative Mentally sharp          Assessment & Plan:   Problem List Items Addressed This Visit      Cardiovascular and Mediastinum   Essential hypertension - Primary    bp in fair control at this time  BP Readings from Last 1 Encounters:  05/21/16 126/74   No changes needed Disc lifstyle change with low sodium diet and exercise  Labs reviewed       Relevant Medications   losartan-hydrochlorothiazide (HYZAAR) 100-25 MG tablet   Other Relevant Orders   CBC with Differential/Platelet (Completed)   Comprehensive metabolic panel (Completed)   Lipid panel (Completed)   TSH (Completed)     Endocrine   Hypothyroidism    TSH today  No clinical changes  No missed levothyroxine doses       Relevant Medications   levothyroxine (SYNTHROID, LEVOTHROID) 50 MCG tablet   Other Relevant Orders   TSH (Completed)     Other   History of rectal bleeding    Pt states this happened one after BM Hx of hemorrhoids in the past  No pain (rectal or abdominal) She sometimes strains with  Will check cbc with labs today  Pt declined rectal exam or further eval unless symptoms occur again  Enc her to alert [place] if she changes her mind         HYPERGLYCEMIA    A1C today    Last 6.1 disc imp of low glycemic diet and wt loss to prevent DM2       Relevant Orders   Hemoglobin A1c (Completed)   Hyperlipidemia    Lipid panel due today  Diet controlled  Rev sat fat intake       Relevant Medications   losartan-hydrochlorothiazide (HYZAAR) 100-25 MG tablet   Other Relevant Orders   Lipid panel (Completed)

## 2016-05-21 NOTE — Progress Notes (Signed)
Pre visit review using our clinic review tool, if applicable. No additional management support is needed unless otherwise documented below in the visit note. 

## 2016-05-21 NOTE — Patient Instructions (Addendum)
Don't forget to get your eye exam  Labs today  Blood pressure is good   If rectal bleeding happens again please let me know (or if you have problems moving bowels or abdominal pain or fever or other symptoms)  Try not to get constipated -take miralax if needed   Take care of yourself

## 2016-05-22 LAB — COMPREHENSIVE METABOLIC PANEL
ALBUMIN: 3.9 g/dL (ref 3.5–5.2)
ALK PHOS: 70 U/L (ref 39–117)
ALT: 12 U/L (ref 0–35)
AST: 18 U/L (ref 0–37)
BUN: 18 mg/dL (ref 6–23)
CO2: 28 mEq/L (ref 19–32)
Calcium: 9.6 mg/dL (ref 8.4–10.5)
Chloride: 105 mEq/L (ref 96–112)
Creatinine, Ser: 0.84 mg/dL (ref 0.40–1.20)
GFR: 68.09 mL/min (ref >60.00)
GLUCOSE: 94 mg/dL (ref 70–99)
Potassium: 3.6 mEq/L (ref 3.5–5.1)
SODIUM: 143 meq/L (ref 135–145)
Total Bilirubin: 0.4 mg/dL (ref 0.2–1.2)
Total Protein: 7 g/dL (ref 6.0–8.3)

## 2016-05-22 LAB — CBC WITH DIFFERENTIAL/PLATELET
BASOS ABS: 0.1 10*3/uL (ref 0.0–0.1)
Basophils Relative: 0.9 % (ref 0.0–3.0)
Eosinophils Absolute: 0.3 10*3/uL (ref 0.0–0.7)
Eosinophils Relative: 2.8 % (ref 0.0–5.0)
HCT: 39.4 % (ref 36.0–46.0)
HEMOGLOBIN: 13 g/dL (ref 12.0–15.0)
LYMPHS ABS: 1.7 10*3/uL (ref 0.7–4.0)
Lymphocytes Relative: 16.2 % (ref 12.0–46.0)
MCHC: 33 g/dL (ref 30.0–36.0)
MCV: 92 fl (ref 78.0–100.0)
MONOS PCT: 8.7 % (ref 3.0–12.0)
Monocytes Absolute: 0.9 10*3/uL (ref 0.1–1.0)
Neutro Abs: 7.5 10*3/uL (ref 1.4–7.7)
Neutrophils Relative %: 71.4 % (ref 43.0–77.0)
Platelets: 334 10*3/uL (ref 150.0–400.0)
RBC: 4.28 Mil/uL (ref 3.87–5.11)
RDW: 14.6 % (ref 11.5–15.5)
WBC: 10.5 10*3/uL (ref 4.0–10.5)

## 2016-05-22 LAB — LIPID PANEL
CHOLESTEROL: 171 mg/dL (ref 0–200)
HDL: 58.2 mg/dL (ref >39.00)
LDL CALC: 95 mg/dL (ref 0–99)
NonHDL: 112.6
Total CHOL/HDL Ratio: 3
Triglycerides: 87 mg/dL (ref 0.0–149.0)
VLDL: 17.4 mg/dL (ref 0.0–40.0)

## 2016-05-22 LAB — TSH: TSH: 1.69 u[IU]/mL (ref 0.35–4.50)

## 2016-05-22 LAB — HEMOGLOBIN A1C: HEMOGLOBIN A1C: 6 % (ref 4.6–6.5)

## 2016-05-22 NOTE — Assessment & Plan Note (Signed)
A1C today  Last 6.1 disc imp of low glycemic diet and wt loss to prevent DM2

## 2016-05-22 NOTE — Assessment & Plan Note (Signed)
TSH today  No clinical changes  No missed levothyroxine doses

## 2016-05-22 NOTE — Assessment & Plan Note (Addendum)
Pt states this happened one after BM Hx of hemorrhoids in the past  No pain (rectal or abdominal) She sometimes strains with  Will check cbc with labs today  Pt declined rectal exam or further eval unless symptoms occur again  Enc her to alert [place] if she changes her mind

## 2016-05-22 NOTE — Assessment & Plan Note (Signed)
Lipid panel due today  Diet controlled  Rev sat fat intake

## 2016-05-22 NOTE — Assessment & Plan Note (Signed)
bp in fair control at this time  BP Readings from Last 1 Encounters:  05/21/16 126/74   No changes needed Disc lifstyle change with low sodium diet and exercise  Labs reviewed

## 2016-05-22 NOTE — Assessment & Plan Note (Signed)
No reoccurrence

## 2016-05-23 ENCOUNTER — Encounter: Payer: Self-pay | Admitting: *Deleted

## 2016-07-14 ENCOUNTER — Telehealth: Payer: Self-pay | Admitting: Family Medicine

## 2016-07-14 NOTE — Telephone Encounter (Signed)
error 

## 2016-07-23 ENCOUNTER — Other Ambulatory Visit: Payer: Self-pay | Admitting: Family Medicine

## 2016-07-25 ENCOUNTER — Ambulatory Visit: Payer: BLUE CROSS/BLUE SHIELD

## 2016-08-18 ENCOUNTER — Ambulatory Visit (INDEPENDENT_AMBULATORY_CARE_PROVIDER_SITE_OTHER): Payer: Medicare Other | Admitting: Podiatry

## 2016-08-18 DIAGNOSIS — M79674 Pain in right toe(s): Secondary | ICD-10-CM

## 2016-08-18 DIAGNOSIS — B351 Tinea unguium: Secondary | ICD-10-CM | POA: Diagnosis not present

## 2016-08-18 DIAGNOSIS — M79675 Pain in left toe(s): Secondary | ICD-10-CM

## 2016-08-18 NOTE — Progress Notes (Signed)
Complaint:  Visit Type: Patient returns to my office for continued preventative foot care services. Complaint: Patient states" my nails have grown long and thick and become painful to walk and wear shoes" . The patient presents for preventative foot care services. No changes to ROS  Podiatric Exam: Vascular: dorsalis pedis and posterior tibial pulses are palpable bilateral. Capillary return is immediate. Temperature gradient is WNL. Skin turgor WNL  Sensorium: Normal Semmes Weinstein monofilament test. Normal tactile sensation bilaterally. Nail Exam: Pt has thick disfigured discolored nails with subungual debris noted bilateral entire nail hallux through fifth toenails Ulcer Exam: There is no evidence of ulcer or pre-ulcerative changes or infection. Orthopedic Exam: Muscle tone and strength are WNL. No limitations in general ROM. No crepitus or effusions noted. Foot type and digits show no abnormalities. Bony prominences are unremarkable. Skin: No Porokeratosis. No infection or ulcers  Diagnosis:  Onychomycosis, , Pain in right toe, pain in left toes  Treatment & Plan Procedures and Treatment: Consent by patient was obtained for treatment procedures. The patient understood the discussion of treatment and procedures well. All questions were answered thoroughly reviewed. Debridement of mycotic and hypertrophic toenails, 1 through 5 bilateral and clearing of subungual debris. No ulceration, no infection noted.  Return Visit-Office Procedure: Patient instructed to return to the office for a follow up visit 3 months   for continued evaluation and treatment.    Filemon Breton DPM 

## 2016-09-23 DIAGNOSIS — H2513 Age-related nuclear cataract, bilateral: Secondary | ICD-10-CM | POA: Diagnosis not present

## 2016-09-26 ENCOUNTER — Other Ambulatory Visit: Payer: Self-pay

## 2016-11-03 DIAGNOSIS — H353231 Exudative age-related macular degeneration, bilateral, with active choroidal neovascularization: Secondary | ICD-10-CM | POA: Diagnosis not present

## 2016-12-01 DIAGNOSIS — H2512 Age-related nuclear cataract, left eye: Secondary | ICD-10-CM | POA: Diagnosis not present

## 2016-12-02 ENCOUNTER — Encounter: Payer: Self-pay | Admitting: *Deleted

## 2016-12-08 DIAGNOSIS — H353211 Exudative age-related macular degeneration, right eye, with active choroidal neovascularization: Secondary | ICD-10-CM | POA: Diagnosis not present

## 2016-12-11 ENCOUNTER — Ambulatory Visit: Payer: Medicare Other | Admitting: Anesthesiology

## 2016-12-11 ENCOUNTER — Encounter
Admission: RE | Disposition: A | Discharge status: Home / Self Care | Payer: Self-pay | Source: Ambulatory Visit | Attending: Ophthalmology

## 2016-12-11 ENCOUNTER — Ambulatory Visit
Admission: RE | Admit: 2016-12-11 | Discharge: 2016-12-11 | Disposition: A | Discharge status: Home / Self Care | Payer: Medicare Other | Source: Ambulatory Visit | Attending: Ophthalmology | Admitting: Ophthalmology

## 2016-12-11 ENCOUNTER — Encounter: Payer: Self-pay | Admitting: *Deleted

## 2016-12-11 DIAGNOSIS — E039 Hypothyroidism, unspecified: Secondary | ICD-10-CM | POA: Insufficient documentation

## 2016-12-11 DIAGNOSIS — Z881 Allergy status to other antibiotic agents status: Secondary | ICD-10-CM | POA: Diagnosis not present

## 2016-12-11 DIAGNOSIS — M8580 Other specified disorders of bone density and structure, unspecified site: Secondary | ICD-10-CM | POA: Diagnosis not present

## 2016-12-11 DIAGNOSIS — I10 Essential (primary) hypertension: Secondary | ICD-10-CM | POA: Insufficient documentation

## 2016-12-11 DIAGNOSIS — Z90710 Acquired absence of both cervix and uterus: Secondary | ICD-10-CM | POA: Diagnosis not present

## 2016-12-11 DIAGNOSIS — Z9012 Acquired absence of left breast and nipple: Secondary | ICD-10-CM | POA: Diagnosis not present

## 2016-12-11 DIAGNOSIS — Z7984 Long term (current) use of oral hypoglycemic drugs: Secondary | ICD-10-CM | POA: Insufficient documentation

## 2016-12-11 DIAGNOSIS — Z853 Personal history of malignant neoplasm of breast: Secondary | ICD-10-CM | POA: Insufficient documentation

## 2016-12-11 DIAGNOSIS — K219 Gastro-esophageal reflux disease without esophagitis: Secondary | ICD-10-CM | POA: Diagnosis not present

## 2016-12-11 DIAGNOSIS — F419 Anxiety disorder, unspecified: Secondary | ICD-10-CM | POA: Diagnosis not present

## 2016-12-11 DIAGNOSIS — H2512 Age-related nuclear cataract, left eye: Secondary | ICD-10-CM | POA: Diagnosis not present

## 2016-12-11 DIAGNOSIS — Z79899 Other long term (current) drug therapy: Secondary | ICD-10-CM | POA: Insufficient documentation

## 2016-12-11 DIAGNOSIS — E119 Type 2 diabetes mellitus without complications: Secondary | ICD-10-CM | POA: Diagnosis not present

## 2016-12-11 DIAGNOSIS — E785 Hyperlipidemia, unspecified: Secondary | ICD-10-CM | POA: Diagnosis not present

## 2016-12-11 DIAGNOSIS — H269 Unspecified cataract: Secondary | ICD-10-CM | POA: Diagnosis not present

## 2016-12-11 HISTORY — DX: Dyspnea, unspecified: R06.00

## 2016-12-11 HISTORY — DX: Hypothyroidism, unspecified: E03.9

## 2016-12-11 HISTORY — DX: Dizziness and giddiness: R42

## 2016-12-11 HISTORY — DX: Gastro-esophageal reflux disease without esophagitis: K21.9

## 2016-12-11 HISTORY — PX: CATARACT EXTRACTION W/PHACO: SHX586

## 2016-12-11 LAB — GLUCOSE, CAPILLARY: GLUCOSE-CAPILLARY: 111 mg/dL — AB (ref 65–99)

## 2016-12-11 SURGERY — PHACOEMULSIFICATION, CATARACT, WITH IOL INSERTION
Anesthesia: Monitor Anesthesia Care | Site: Eye | Laterality: Left | Wound class: Clean

## 2016-12-11 MED ORDER — ARMC OPHTHALMIC DILATING DROPS
OPHTHALMIC | Status: AC
  Administered 2016-12-11: 1 via OPHTHALMIC
  Filled 2016-12-11: qty 0.4

## 2016-12-11 MED ORDER — FENTANYL CITRATE (PF) 100 MCG/2ML IJ SOLN
INTRAMUSCULAR | Status: DC | PRN
  Administered 2016-12-11 (×2): 50 ug via INTRAVENOUS

## 2016-12-11 MED ORDER — MOXIFLOXACIN HCL 0.5 % OP SOLN
OPHTHALMIC | Status: DC
Start: 2016-12-11 — End: 2016-12-11
  Filled 2016-12-11: qty 3

## 2016-12-11 MED ORDER — NA CHONDROIT SULF-NA HYALURON 40-30 MG/ML IO SOLN
INTRAOCULAR | Status: DC | PRN
  Administered 2016-12-11: 1 mL via INTRAOCULAR

## 2016-12-11 MED ORDER — SODIUM CHLORIDE 0.9 % IV SOLN
INTRAVENOUS | Status: DC
  Administered 2016-12-11: 07:00:00 via INTRAVENOUS

## 2016-12-11 MED ORDER — POVIDONE-IODINE 5 % OP SOLN
OPHTHALMIC | Status: DC | PRN
  Administered 2016-12-11: 1 via OPHTHALMIC

## 2016-12-11 MED ORDER — MOXIFLOXACIN HCL 0.5 % OP SOLN: 1.0000 [drp] | OPHTHALMIC | Status: DC | PRN

## 2016-12-11 MED ORDER — LIDOCAINE HCL (PF) 4 % IJ SOLN
INTRAMUSCULAR | Status: DC | PRN
  Administered 2016-12-11: 1 mL via OPHTHALMIC

## 2016-12-11 MED ORDER — MOXIFLOXACIN HCL 0.5 % OP SOLN
OPHTHALMIC | Status: DC | PRN
  Administered 2016-12-11: 0.2 mL via OPHTHALMIC

## 2016-12-11 MED ORDER — FENTANYL CITRATE (PF) 100 MCG/2ML IJ SOLN
INTRAMUSCULAR | Status: AC
  Filled 2016-12-11: qty 2

## 2016-12-11 MED ORDER — ARMC OPHTHALMIC DILATING DROPS
1.0000 "application " | OPHTHALMIC | Status: AC
  Administered 2016-12-11 (×3): 1 via OPHTHALMIC

## 2016-12-11 SURGICAL SUPPLY — 16 items
DISSECTOR HYDRO NUCLEUS 50X22 (MISCELLANEOUS) ×3 IMPLANT
GLOVE BIO SURGEON STRL SZ8 (GLOVE) ×5 IMPLANT
GLOVE BIOGEL M 6.5 STRL (GLOVE) ×5 IMPLANT
GLOVE SURG LX 7.5 STRW (GLOVE) ×2
GLOVE SURG LX STRL 7.5 STRW (GLOVE) ×1 IMPLANT
GOWN STRL REUS W/ TWL LRG LVL3 (GOWN DISPOSABLE) ×2 IMPLANT
GOWN STRL REUS W/TWL LRG LVL3 (GOWN DISPOSABLE) ×6
LABEL CATARACT MEDS ST (LABEL) ×3 IMPLANT
LENS IOL TECNIS ITEC 26.0 (Intraocular Lens) ×2 IMPLANT
PACK CATARACT (MISCELLANEOUS) ×3 IMPLANT
PACK CATARACT KING (MISCELLANEOUS) ×3 IMPLANT
PACK EYE AFTER SURG (MISCELLANEOUS) ×3 IMPLANT
SOL BSS BAG (MISCELLANEOUS) ×3
SOLUTION BSS BAG (MISCELLANEOUS) ×1 IMPLANT
WATER STERILE IRR 250ML POUR (IV SOLUTION) ×3 IMPLANT
WIPE NON LINTING 3.25X3.25 (MISCELLANEOUS) ×3 IMPLANT

## 2016-12-11 NOTE — Op Note (Signed)
OPERATIVE NOTE  [name] [id number] 12/11/2016   PREOPERATIVE DIAGNOSIS:  Nuclear sclerotic cataract left eye.  H25.12   POSTOPERATIVE DIAGNOSIS:    Nuclear sclerotic cataract left eye.     PROCEDURE:  Phacoemusification with posterior chamber intraocular lens placement of the left eye   LENS:   Implant Name Type Inv. Item Serial No. Manufacturer Lot No. LRB No. Used  LENS IOL DIOP 26.0 - [id number] Intraocular Lens LENS IOL DIOP 26.0 [id number] AMO   Left 1       PCB00 +26.0   ULTRASOUND TIME: 1 minutes 18 seconds.  CDE 16.13   SURGEON:  Benay Pillow, MD, MPH   ANESTHESIA:  Topical with tetracaine drops augmented with 1% preservative-free intracameral lidocaine.  ESTIMATED BLOOD LOSS: <1 mL   COMPLICATIONS:  None.   DESCRIPTION OF PROCEDURE:  The patient was identified in the holding room and transported to the operating room and placed in the supine position under the operating microscope.  The left eye was identified as the operative eye and it was prepped and draped in the usual sterile ophthalmic fashion.   A 1.0 millimeter clear-corneal paracentesis was made at the 5:00 position. 0.5 ml of preservative-free 1% lidocaine with epinephrine was injected into the anterior chamber.  The anterior chamber was filled with Discovisc viscoelastic.  A 2.4 millimeter keratome was used to make a near-clear corneal incision at the 2:00 position.  A curvilinear capsulorrhexis was made with a cystotome and capsulorrhexis forceps.  Balanced salt solution was used to hydrodissect and hydrodelineate the nucleus.   Phacoemulsification was then used in stop and chop fashion to remove the lens nucleus and epinucleus.  The remaining cortex was then removed using the irrigation and aspiration handpiece. Discovisc was then placed into the capsular bag to distend it for lens placement.  A lens was then injected into the capsular bag.  The remaining viscoelastic was aspirated.   Wounds were  hydrated with balanced salt solution.  The anterior chamber was inflated to a physiologic pressure with balanced salt solution.  Intracameral vigamox 0.1 mL undiltued was injected into the eye and a drop placed onto the ocular surface.  No wound leaks were noted.  The patient was taken to the recovery room in stable condition without complications of anesthesia or surgery  Benay Pillow 12/11/2016, 9:21 AM

## 2016-12-11 NOTE — H&P (Signed)
The History and Physical notes are on paper, have been signed, and are to be scanned.   I have examined the patient and there are no changes to the H&P.   Benay Pillow 12/11/2016 8:39 AM

## 2016-12-11 NOTE — Transfer of Care (Signed)
Immediate Anesthesia Transfer of Care Note  Patient: [name]  Procedure(s) Performed: CATARACT EXTRACTION PHACO AND INTRAOCULAR LENS PLACEMENT (IOC) (Left Eye)  Patient Location: PACU  Anesthesia Type:MAC  Level of Consciousness: awake, alert  and oriented  Airway & Oxygen Therapy: Patient Spontanous Breathing  Post-op Assessment: Report given to RN and Post -op Vital signs reviewed and stable  Post vital signs: Reviewed and stable  Last Vitals:  Vitals:   12/11/16 0711  BP: (!) 149/60  Pulse: 63  Resp: 18  Temp: (!) 36.4 C  SpO2: 98%    Last Pain:  Vitals:   12/11/16 0711  TempSrc: Tympanic         Complications: No apparent anesthesia complications

## 2016-12-11 NOTE — Anesthesia Preprocedure Evaluation (Signed)
Anesthesia Evaluation  Patient identified by MRN, date of birth, ID band Patient awake    Reviewed: Allergy & Precautions, H&P , NPO status , Patient's Chart, lab work & pertinent test results, reviewed documented beta blocker date and time   History of Anesthesia Complications (+) PONV and history of anesthetic complications  Airway Mallampati: II  TM Distance: >3 FB Neck ROM: full    Dental  (+) Missing, Poor Dentition, Dental Advidsory Given   Pulmonary shortness of breath and with exertion, neg COPD, neg recent URI,           Cardiovascular Exercise Tolerance: Good hypertension, (-) angina(-) CAD, (-) Past MI, (-) Cardiac Stents and (-) CABG (-) dysrhythmias (-) Valvular Problems/Murmurs     Neuro/Psych negative neurological ROS  negative psych ROS   GI/Hepatic Neg liver ROS, GERD  ,  Endo/Other  diabetes, Oral Hypoglycemic AgentsHypothyroidism   Renal/GU negative Renal ROS  negative genitourinary   Musculoskeletal   Abdominal   Peds  Hematology negative hematology ROS (+)   Anesthesia Other Findings Past Medical History: 12/2002: Anxiety No date: Cancer (HCC)     Comment:  left breast cancer 11/2004: Diabetes mellitus     Comment:  Type 2 No date: Dyspnea     Comment:  WITH EXERTION No date: GERD (gastroesophageal reflux disease) 11/2004: Hyperlipidemia 12/2002: Hypertension No date: Hypothyroidism 02/2002: Osteopenia     Comment:  02/2002 Dexa osteopenia// Dexa 02/26/2004 (Dr. Lomax)               stable/IMR femur/spine No date: Spinal stenosis No date: Thyroid disease     Comment:  Hypothryoid after radio ablation of thyroid// Dr.               Morayati endocrinologist No date: Vertigo   Reproductive/Obstetrics negative OB ROS                             Anesthesia Physical  Anesthesia Plan  ASA: III  Anesthesia Plan: MAC   Post-op Pain Management:    Induction:  Intravenous  PONV Risk Score and Plan:   Airway Management Planned: Natural Airway and Nasal Cannula  Additional Equipment:   Intra-op Plan:   Post-operative Plan:   Informed Consent: I have reviewed the patients History and Physical, chart, labs and discussed the procedure including the risks, benefits and alternatives for the proposed anesthesia with the patient or authorized representative who has indicated his/her understanding and acceptance.   Dental Advisory Given  Plan Discussed with: Anesthesiologist, CRNA and Surgeon  Anesthesia Plan Comments:         Anesthesia Quick Evaluation  

## 2016-12-11 NOTE — Discharge Instructions (Signed)

## 2016-12-11 NOTE — Anesthesia Postprocedure Evaluation (Signed)
Anesthesia Post Note  Patient: [name]  Procedure(s) Performed: CATARACT EXTRACTION PHACO AND INTRAOCULAR LENS PLACEMENT (IOC) (Left Eye)  Patient location during evaluation: PACU Anesthesia Type: MAC Level of consciousness: awake and alert Pain management: pain level controlled Vital Signs Assessment: post-procedure vital signs reviewed and stable Respiratory status: spontaneous breathing, nonlabored ventilation, respiratory function stable and patient connected to nasal cannula oxygen Cardiovascular status: stable and blood pressure returned to baseline Postop Assessment: no apparent nausea or vomiting Anesthetic complications: no     Last Vitals:  Vitals:   12/11/16 0922 12/11/16 0939  BP: (!) 171/58 (!) 168/70  Pulse: 66   Resp: 16   Temp: 36.6 C   SpO2: 99%     Last Pain:  Vitals:   12/11/16 0711  TempSrc: Tympanic                 Martha Clan

## 2016-12-11 NOTE — Anesthesia Post-op Follow-up Note (Signed)
Anesthesia QCDR form completed.        

## 2016-12-17 DIAGNOSIS — Z23 Encounter for immunization: Secondary | ICD-10-CM | POA: Diagnosis not present

## 2016-12-25 DIAGNOSIS — H2511 Age-related nuclear cataract, right eye: Secondary | ICD-10-CM | POA: Diagnosis not present

## 2016-12-30 ENCOUNTER — Encounter: Payer: Self-pay | Admitting: *Deleted

## 2017-01-01 ENCOUNTER — Ambulatory Visit: Payer: Medicare Other | Admitting: Anesthesiology

## 2017-01-01 ENCOUNTER — Encounter
Admission: RE | Disposition: A | Discharge status: Home / Self Care | Payer: Self-pay | Source: Ambulatory Visit | Attending: Ophthalmology

## 2017-01-01 ENCOUNTER — Encounter: Payer: Self-pay | Admitting: *Deleted

## 2017-01-01 ENCOUNTER — Ambulatory Visit
Admission: RE | Admit: 2017-01-01 | Discharge: 2017-01-01 | Disposition: A | Discharge status: Home / Self Care | Payer: Medicare Other | Source: Ambulatory Visit | Attending: Ophthalmology | Admitting: Ophthalmology

## 2017-01-01 DIAGNOSIS — I10 Essential (primary) hypertension: Secondary | ICD-10-CM | POA: Diagnosis not present

## 2017-01-01 DIAGNOSIS — Z79899 Other long term (current) drug therapy: Secondary | ICD-10-CM | POA: Insufficient documentation

## 2017-01-01 DIAGNOSIS — H2511 Age-related nuclear cataract, right eye: Secondary | ICD-10-CM | POA: Insufficient documentation

## 2017-01-01 DIAGNOSIS — E039 Hypothyroidism, unspecified: Secondary | ICD-10-CM | POA: Diagnosis not present

## 2017-01-01 DIAGNOSIS — K219 Gastro-esophageal reflux disease without esophagitis: Secondary | ICD-10-CM | POA: Insufficient documentation

## 2017-01-01 DIAGNOSIS — Z7982 Long term (current) use of aspirin: Secondary | ICD-10-CM | POA: Diagnosis not present

## 2017-01-01 DIAGNOSIS — E119 Type 2 diabetes mellitus without complications: Secondary | ICD-10-CM | POA: Diagnosis not present

## 2017-01-01 HISTORY — PX: CATARACT EXTRACTION W/PHACO: SHX586

## 2017-01-01 LAB — GLUCOSE, CAPILLARY: GLUCOSE-CAPILLARY: 111 mg/dL — AB (ref 65–99)

## 2017-01-01 SURGERY — PHACOEMULSIFICATION, CATARACT, WITH IOL INSERTION
Anesthesia: Monitor Anesthesia Care | Site: Eye | Laterality: Right | Wound class: Clean

## 2017-01-01 MED ORDER — MOXIFLOXACIN HCL 0.5 % OP SOLN
OPHTHALMIC | Status: AC
  Filled 2017-01-01: qty 3

## 2017-01-01 MED ORDER — MIDAZOLAM HCL 2 MG/2ML IJ SOLN
INTRAMUSCULAR | Status: AC
  Filled 2017-01-01: qty 2

## 2017-01-01 MED ORDER — EPINEPHRINE PF 1 MG/ML IJ SOLN
INTRAMUSCULAR | Status: AC
  Filled 2017-01-01: qty 1

## 2017-01-01 MED ORDER — POVIDONE-IODINE 5 % OP SOLN
OPHTHALMIC | Status: DC | PRN
  Administered 2017-01-01: 1 via OPHTHALMIC

## 2017-01-01 MED ORDER — TRIAMCINOLONE ACETONIDE 40 MG/ML IJ SUSP
INTRAMUSCULAR | Status: AC
  Filled 2017-01-01: qty 1

## 2017-01-01 MED ORDER — NEOMYCIN-POLYMYXIN-DEXAMETH 3.5-10000-0.1 OP SUSP
OPHTHALMIC | Status: DC | PRN
  Administered 2017-01-01: 1 [drp] via OPHTHALMIC

## 2017-01-01 MED ORDER — BSS IO SOLN
INTRAOCULAR | Status: DC | PRN
  Administered 2017-01-01: 15 mL via INTRAOCULAR

## 2017-01-01 MED ORDER — POVIDONE-IODINE 5 % OP SOLN
OPHTHALMIC | Status: AC
  Filled 2017-01-01: qty 30

## 2017-01-01 MED ORDER — NA CHONDROIT SULF-NA HYALURON 40-30 MG/ML IO SOLN
INTRAOCULAR | Status: DC | PRN
  Administered 2017-01-01: 0.5 mL via INTRAOCULAR

## 2017-01-01 MED ORDER — CARBACHOL 0.01 % IO SOLN
INTRAOCULAR | Status: DC | PRN
  Administered 2017-01-01: 0.5 mL via INTRAOCULAR

## 2017-01-01 MED ORDER — NA HYALUR & NA CHOND-NA HYALUR 0.55-0.5 ML IO KIT
PACK | INTRAOCULAR | Status: DC | PRN
  Administered 2017-01-01: 1 via OPHTHALMIC

## 2017-01-01 MED ORDER — MOXIFLOXACIN HCL 0.5 % OP SOLN: 1.0000 [drp] | OPHTHALMIC | Status: DC | PRN

## 2017-01-01 MED ORDER — TRIAMCINOLONE ACETONIDE 40 MG/ML IJ SUSP
INTRAMUSCULAR | Status: DC | PRN
  Administered 2017-01-01: 11:00:00 via SUBCUTANEOUS

## 2017-01-01 MED ORDER — EPINEPHRINE PF 1 MG/ML IJ SOLN
INTRAMUSCULAR | Status: DC | PRN
  Administered 2017-01-01: 10:00:00 via OPHTHALMIC

## 2017-01-01 MED ORDER — NA CHONDROIT SULF-NA HYALURON 40-30 MG/ML IO SOLN
INTRAOCULAR | Status: AC
  Filled 2017-01-01: qty 0.5

## 2017-01-01 MED ORDER — MIDAZOLAM HCL 2 MG/2ML IJ SOLN
INTRAMUSCULAR | Status: DC | PRN
  Administered 2017-01-01 (×2): 0.5 mg via INTRAVENOUS

## 2017-01-01 MED ORDER — FENTANYL CITRATE (PF) 100 MCG/2ML IJ SOLN
INTRAMUSCULAR | Status: AC
  Filled 2017-01-01: qty 2

## 2017-01-01 MED ORDER — FENTANYL CITRATE (PF) 100 MCG/2ML IJ SOLN
INTRAMUSCULAR | Status: DC | PRN
  Administered 2017-01-01 (×2): 25 ug via INTRAVENOUS
  Administered 2017-01-01: 50 ug via INTRAVENOUS

## 2017-01-01 MED ORDER — SODIUM CHLORIDE 0.9 % IV SOLN
INTRAVENOUS | Status: DC
  Administered 2017-01-01: 09:00:00 via INTRAVENOUS

## 2017-01-01 MED ORDER — LIDOCAINE HCL (PF) 4 % IJ SOLN
INTRAMUSCULAR | Status: AC
  Filled 2017-01-01: qty 5

## 2017-01-01 MED ORDER — ARMC OPHTHALMIC DILATING DROPS
1.0000 "application " | OPHTHALMIC | Status: AC
  Administered 2017-01-01 (×3): 1 via OPHTHALMIC

## 2017-01-01 MED ORDER — LIDOCAINE HCL (PF) 4 % IJ SOLN
INTRAMUSCULAR | Status: DC | PRN
  Administered 2017-01-01: 4 mL via OPHTHALMIC

## 2017-01-01 MED ORDER — MOXIFLOXACIN HCL 0.5 % OP SOLN
OPHTHALMIC | Status: DC | PRN
  Administered 2017-01-01: 0.2 mL via OPHTHALMIC

## 2017-01-01 MED ORDER — LABETALOL HCL 5 MG/ML IV SOLN
INTRAVENOUS | Status: DC | PRN
  Administered 2017-01-01 (×3): 5 mg via INTRAVENOUS

## 2017-01-01 MED ORDER — NEOMYCIN-POLYMYXIN-DEXAMETH 3.5-10000-0.1 OP OINT
TOPICAL_OINTMENT | OPHTHALMIC | Status: AC
  Filled 2017-01-01: qty 3.5

## 2017-01-01 MED ORDER — ARMC OPHTHALMIC DILATING DROPS
OPHTHALMIC | Status: AC
  Filled 2017-01-01: qty 0.4

## 2017-01-01 MED ORDER — NA HYALUR & NA CHOND-NA HYALUR 0.55-0.5 ML IO KIT
PACK | INTRAOCULAR | Status: AC
  Filled 2017-01-01: qty 1.05

## 2017-01-01 SURGICAL SUPPLY — 24 items
DISSECTOR HYDRO NUCLEUS 50X22 (MISCELLANEOUS) ×3 IMPLANT
GLOVE BIO SURGEON STRL SZ8 (GLOVE) ×3 IMPLANT
GLOVE BIOGEL M 6.5 STRL (GLOVE) ×3 IMPLANT
GLOVE SURG LX 7.5 STRW (GLOVE) ×2
GLOVE SURG LX STRL 7.5 STRW (GLOVE) ×1 IMPLANT
GOWN STRL REUS W/ TWL LRG LVL3 (GOWN DISPOSABLE) ×2 IMPLANT
GOWN STRL REUS W/TWL LRG LVL3 (GOWN DISPOSABLE) ×6
LABEL CATARACT MEDS ST (LABEL) ×3 IMPLANT
LENS IOL ACRSF MP 25.0 (Intraocular Lens) IMPLANT
LENS IOL ACRYSOF POST 25.0 (Intraocular Lens) ×3 IMPLANT
LENS IOL TECNIS ITEC 26.0 (Intraocular Lens) IMPLANT
NDL SAFETY ECLIPSE 18X1.5 (NEEDLE) IMPLANT
NEEDLE HYPO 18GX1.5 SHARP (NEEDLE) ×3
PACK CATARACT (MISCELLANEOUS) ×3 IMPLANT
PACK CATARACT KING (MISCELLANEOUS) ×3 IMPLANT
PACK EYE AFTER SURG (MISCELLANEOUS) ×3 IMPLANT
PACK VIT ANT 23G (MISCELLANEOUS) ×2 IMPLANT
SOL BSS BAG (MISCELLANEOUS) ×3
SOLUTION BSS BAG (MISCELLANEOUS) ×1 IMPLANT
SUT ETHILON 10 0 CS140 6 (SUTURE) ×2 IMPLANT
SYR 3ML LL SCALE MARK (SYRINGE) ×2 IMPLANT
SYR 5ML LL (SYRINGE) ×2 IMPLANT
WATER STERILE IRR 250ML POUR (IV SOLUTION) ×3 IMPLANT
WIPE NON LINTING 3.25X3.25 (MISCELLANEOUS) ×3 IMPLANT

## 2017-01-01 NOTE — Discharge Instructions (Signed)
Eye Surgery Discharge Instructions  Expect mild scratchy sensation or mild soreness. DO NOT RUB YOUR EYE!  The day of surgery:  Minimal physical activity, but bed rest is not required  No reading, computer work, or close hand work  No bending, lifting, or straining.  May watch TV  For 24 hours:  No driving, legal decisions, or alcoholic beverages  Safety precautions  Eat anything you prefer: It is better to start with liquids, then soup then solid foods.  _____ Eye patch should be worn until postoperative exam tomorrow.  _x___ Solar shield eyeglasses should be worn for comfort in the sunlight/patch while sleeping  Place eye patch from the blue bag over your right eye at bedtime for one week.  Resume all regular medications including aspirin or Coumadin if these were discontinued prior to surgery.  You may shower, bathe, shave, or wash your hair.  Tylenol may be taken for mild discomfort.  Call your doctor if you experience significant pain, nausea, or vomiting, fever > 101 or other signs of infection. [phone] or [phone] Specific instructions:  Follow-up Information    Eulogio Bear, MD Follow up on 01/02/2017.   Specialty:  Ophthalmology Why:  Follow up appointment will be in the [place] office at 9:00am.  Directions to the office will be included in the discharge packet. Contact information: [address] [place] [address] [phone]

## 2017-01-01 NOTE — H&P (Signed)
The History and Physical notes are on paper, have been signed, and are to be scanned.   I have examined the patient and there are no changes to the H&P.   Benay Pillow 01/01/2017 9:49 AM

## 2017-01-01 NOTE — Transfer of Care (Signed)
Immediate Anesthesia Transfer of Care Note  Patient: [name]  Procedure(s) Performed: CATARACT EXTRACTION PHACO AND INTRAOCULAR LENS PLACEMENT (IOC) (Right Eye)  Patient Location: PACU  Anesthesia Type:General  Level of Consciousness: awake, alert  and oriented  Airway & Oxygen Therapy: Patient Spontanous Breathing  Post-op Assessment: Report given to RN and Post -op Vital signs reviewed and stable  Post vital signs: Reviewed and stable  Last Vitals:  Vitals:   01/01/17 0855  BP: (!) 159/70  Pulse: 66  Resp: 16  Temp: (!) 36.1 C  SpO2: 98%    Last Pain:  Vitals:   01/01/17 0855  TempSrc: Tympanic         Complications: No apparent anesthesia complications

## 2017-01-01 NOTE — Anesthesia Postprocedure Evaluation (Signed)
Anesthesia Post Note  Patient: Jasleen B Kincannon  Procedure(s) Performed: CATARACT EXTRACTION PHACO AND INTRAOCULAR LENS PLACEMENT (IOC) (Right Eye)  Patient location during evaluation: PACU Anesthesia Type: MAC Level of consciousness: awake and alert Pain management: pain level controlled Vital Signs Assessment: post-procedure vital signs reviewed and stable Respiratory status: spontaneous breathing, nonlabored ventilation, respiratory function stable and patient connected to nasal cannula oxygen Cardiovascular status: stable and blood pressure returned to baseline Postop Assessment: no apparent nausea or vomiting Anesthetic complications: no     Last Vitals:  Vitals:   01/01/17 0855 01/01/17 1103  BP: (!) 159/70 (!) 200/78  Pulse: 66 (!) 57  Resp: 16 16  Temp: (!) 36.1 C 36.6 C  SpO2: 98% 100%    Last Pain:  Vitals:   01/01/17 1103  TempSrc: Temporal  PainSc: 5                  Martha Clan

## 2017-01-01 NOTE — Anesthesia Post-op Follow-up Note (Signed)
Anesthesia QCDR form completed.        

## 2017-01-01 NOTE — Op Note (Signed)
OPERATIVE NOTE  [name] [id number] 01/01/2017   PREOPERATIVE DIAGNOSIS:  Nuclear sclerotic cataract right eye.  H25.11   POSTOPERATIVE DIAGNOSIS:    Nuclear sclerotic cataract right eye.     PROCEDURE:  Phacoemusification with posterior chamber intraocular lens placement of the right eye   LENS:   Implant Name Type Inv. Item Serial No. Manufacturer Lot No. LRB No. Used  LENS IOL DIOP 26.0 - [id number] Intraocular Lens LENS IOL DIOP 26.0 [id number]  Right 1  LENS IOL POST 25.0 - [id number] Intraocular Lens LENS IOL POST 25.0 [id number] ALCON   Right 1       MA60AC 25.0 D 3 piece lens placed in the sulcus.    ULTRASOUND TIME: 1 minutes 45 seconds.  CDE 18.21   SURGEON:  Benay Pillow, MD, MPH  ANESTHESIOLOGIST: Anesthesiologist: Martha Clan, MD CRNA: Vaughan Sine   ANESTHESIA:  Topical with tetracaine drops augmented with 1% preservative-free intracameral lidocaine.  ESTIMATED BLOOD LOSS: less than 1 mL.   COMPLICATIONS:  Anterior capsular tear 3-4:00 .   DESCRIPTION OF PROCEDURE:  The patient was identified in the holding room and transported to the operating room and placed in the supine position under the operating microscope.  The right eye was identified as the operative eye and it was prepped and draped in the usual sterile ophthalmic fashion.   A 1.0 millimeter clear-corneal paracentesis was made at the 10:30 position. 0.5 ml of preservative-free 1% lidocaine with epinephrine was injected into the anterior chamber.  The anterior chamber was filled with viscoat viscoelastic.  A 2.4 millimeter keratome was used to make a near-clear corneal incision at the 8:00 position.  A curvilinear capsulorrhexis was made with a cystotome and capsulorrhexis forceps.  Balanced salt solution was used to hydrodissect and hydrodelineate the nucleus.   Phacoemulsification was then used in stop and chop fashion to remove the lens nucleus and epinucleus.  The remaining  cortex was then removed using the irrigation and aspiration handpiece.   After some removal of cortex, an anterior capsular tear was noted.   The posterior capsule appeared attached but there was question of vitreous presenting through a zonular dialysis in the same location.  Viscoat was placed in the anterior chamber.  The small amount of remaining cortex was aspirated carefully with the I/A without signs of vitreous movement.  The wound was enlarged and a 3 piece lens was placed in the sulcus with good centration and support from the anterior capsule.    The wounds were hydrated with balanced salt solution to stabilize the [facility] but this caused some iris prolapse.  The iris was reposited with a sweep via the paracentesis.  Kenalog was injected into the [facility] and no vitreous was identified.    Miostat was injected and the pupil become smaller.  The I/A was placed back in the eye with a bottle heigh equivalent of 40 and careful aspiration of the OVD was performed.    A 10-0 nylon suture was placed through the temporal wound.   Intracameral vigamox 0.1 mL undiluted was injected into the eye and a drop placed onto the ocular surface.  No wound leaks were noted.    Maxitrol ointment, patch and shield were placed over the eye.  The patient was taken to the recovery room in stable condition.   Benay Pillow 01/01/2017, 10:58 AM

## 2017-01-01 NOTE — Anesthesia Preprocedure Evaluation (Signed)
Anesthesia Evaluation  Patient identified by MRN, date of birth, ID band Patient awake    Reviewed: Allergy & Precautions, H&P , NPO status , Patient's Chart, lab work & pertinent test results, reviewed documented beta blocker date and time   History of Anesthesia Complications (+) PONV and history of anesthetic complications  Airway Mallampati: II  TM Distance: >3 FB Neck ROM: full    Dental  (+) Missing, Poor Dentition, Dental Advidsory Given   Pulmonary shortness of breath and with exertion, neg COPD, neg recent URI,           Cardiovascular Exercise Tolerance: Good hypertension, (-) angina(-) CAD, (-) Past MI, (-) Cardiac Stents and (-) CABG (-) dysrhythmias (-) Valvular Problems/Murmurs     Neuro/Psych negative neurological ROS  negative psych ROS   GI/Hepatic Neg liver ROS, GERD  ,  Endo/Other  diabetes, Oral Hypoglycemic AgentsHypothyroidism   Renal/GU negative Renal ROS  negative genitourinary   Musculoskeletal   Abdominal   Peds  Hematology negative hematology ROS (+)   Anesthesia Other Findings Past Medical History: 12/2002: Anxiety No date: Cancer [facility])     Comment:  left breast cancer 11/2004: Diabetes mellitus     Comment:  Type 2 No date: Dyspnea     Comment:  WITH EXERTION No date: GERD (gastroesophageal reflux disease) 11/2004: Hyperlipidemia 12/2002: Hypertension No date: Hypothyroidism 02/2002: Osteopenia     Comment:  02/2002 Dexa osteopenia// Dexa 02/26/2004 (Dr. Ubaldo Glassing)               stable/IMR femur/spine No date: Spinal stenosis No date: Thyroid disease     Comment:  Hypothryoid after radio ablation of thyroid// Dr.               Ronnald Collum endocrinologist No date: Vertigo   Reproductive/Obstetrics negative OB ROS                             Anesthesia Physical  Anesthesia Plan  ASA: III  Anesthesia Plan: MAC   Post-op Pain Management:    Induction:  Intravenous  PONV Risk Score and Plan:   Airway Management Planned: Natural Airway and Nasal Cannula  Additional Equipment:   Intra-op Plan:   Post-operative Plan:   Informed Consent: I have reviewed the patients History and Physical, chart, labs and discussed the procedure including the risks, benefits and alternatives for the proposed anesthesia with the patient or authorized representative who has indicated his/her understanding and acceptance.   Dental Advisory Given  Plan Discussed with: Anesthesiologist, CRNA and Surgeon  Anesthesia Plan Comments:         Anesthesia Quick Evaluation

## 2017-01-02 ENCOUNTER — Encounter: Payer: Self-pay | Admitting: Ophthalmology

## 2017-01-12 DIAGNOSIS — H353211 Exudative age-related macular degeneration, right eye, with active choroidal neovascularization: Secondary | ICD-10-CM | POA: Diagnosis not present

## 2017-02-24 DIAGNOSIS — H353211 Exudative age-related macular degeneration, right eye, with active choroidal neovascularization: Secondary | ICD-10-CM | POA: Diagnosis not present

## 2017-03-09 ENCOUNTER — Telehealth: Payer: Self-pay | Admitting: Family Medicine

## 2017-03-09 NOTE — Telephone Encounter (Signed)
[name] called to ask about macular degeneration. She said that she had been diagnosed with it and she just wanted to know about it. She states that she is getting an injection in her eye for this. She just wanted to know if there is anything else that she could get. I told her to talk with her eye doctor to see what else could be done.

## 2017-03-11 NOTE — Telephone Encounter (Signed)
Thanks for letting me know.   Follow the directions of the eye doctor

## 2017-03-19 ENCOUNTER — Telehealth: Payer: Self-pay

## 2017-03-19 NOTE — Telephone Encounter (Signed)
There is a 4:15 unavailable slot - could we use that for this patients CPE?  Please let me know so I can advise the pt if we need to reschedule to another day.

## 2017-03-19 NOTE — Telephone Encounter (Signed)
See below message

## 2017-03-19 NOTE — Telephone Encounter (Signed)
Copied from [place] [id number]. Topic: Inquiry >> Mar 19, 2017  8:36 AM Pricilla Handler wrote: Reason for CRM: Called Patient regarding her Physical appt that was scheduled 03/20/2017. I had originally scheduled the patient for a physical on 03/20/2017 at 3:45pm using Epic's automatic selection option. I did not realize that Dr. Glori Bickers had one physical before this scheduled physical already that day. I received a e-mail stating that I had made this scheduling error, in which I apologize for. The email also requested that I contact the patient to reschedule. I called the patient. Patient states that her son has already taken off to bring her on tomorrow for her physical. Is there any possibility that Dr. Glori Bickers would see this patient for a Physical at 3:45pm? I apologize again for the error, and will not select the automatic option again for scheduling again.       Thank You!!!

## 2017-03-19 NOTE — Telephone Encounter (Signed)
Leafy Ro is taking care of this

## 2017-03-20 ENCOUNTER — Encounter: Payer: Self-pay | Admitting: Family Medicine

## 2017-03-20 ENCOUNTER — Ambulatory Visit (INDEPENDENT_AMBULATORY_CARE_PROVIDER_SITE_OTHER): Payer: Medicare Other | Admitting: Family Medicine

## 2017-03-20 ENCOUNTER — Encounter: Payer: BLUE CROSS/BLUE SHIELD | Admitting: Family Medicine

## 2017-03-20 VITALS — BP 142/64 | HR 62 | Temp 97.9°F | Ht 59.5 in | Wt 139.2 lb

## 2017-03-20 DIAGNOSIS — R7309 Other abnormal glucose: Secondary | ICD-10-CM | POA: Diagnosis not present

## 2017-03-20 DIAGNOSIS — H353130 Nonexudative age-related macular degeneration, bilateral, stage unspecified: Secondary | ICD-10-CM

## 2017-03-20 DIAGNOSIS — E7800 Pure hypercholesterolemia, unspecified: Secondary | ICD-10-CM

## 2017-03-20 DIAGNOSIS — I10 Essential (primary) hypertension: Secondary | ICD-10-CM

## 2017-03-20 DIAGNOSIS — C50919 Malignant neoplasm of unspecified site of unspecified female breast: Secondary | ICD-10-CM | POA: Diagnosis not present

## 2017-03-20 DIAGNOSIS — M8580 Other specified disorders of bone density and structure, unspecified site: Secondary | ICD-10-CM | POA: Diagnosis not present

## 2017-03-20 DIAGNOSIS — L602 Onychogryphosis: Secondary | ICD-10-CM | POA: Diagnosis not present

## 2017-03-20 DIAGNOSIS — E039 Hypothyroidism, unspecified: Secondary | ICD-10-CM | POA: Diagnosis not present

## 2017-03-20 DIAGNOSIS — H3530 Unspecified macular degeneration: Secondary | ICD-10-CM | POA: Insufficient documentation

## 2017-03-20 MED ORDER — LOSARTAN POTASSIUM-HCTZ 100-25 MG PO TABS: 1.0000 | ORAL_TABLET | Freq: Every day | ORAL | 3 refills | Status: DC

## 2017-03-20 MED ORDER — GABAPENTIN 100 MG PO CAPS: ORAL_CAPSULE | ORAL | 3 refills | Status: DC

## 2017-03-20 MED ORDER — LEVOTHYROXINE SODIUM 50 MCG PO TABS: 50.0000 ug | ORAL_TABLET | Freq: Every day | ORAL | 3 refills | Status: DC

## 2017-03-20 NOTE — Progress Notes (Signed)
Subjective:    Patient ID: [name], female    DOB: 01/21/1927, 84 y.o.   MRN: [id number]  HPI  Here for annual f/u of chronic medical problems   Feeling good overall  Tired at times  Doing well for her age - tries to be patient   No falls this year  No fractures  She is very careful    Still takes care of her own finances/etc  Memory is pretty good  occ she forgets why she walked into a room  Doing really well   Has had cataract surgery  Has macular degeneration - it is getting worse in both eyes (afraid to loose her vision) -has injections in eye every 4 weeks   Did not have amw   Wt Readings from Last 3 Encounters:  03/20/17 139 lb 4 oz (63.2 kg)  01/01/17 140 lb (63.5 kg)  12/02/16 140 lb (63.5 kg)  wt is stable  Stays pretty active  Eating well and appetite is good - she likes the meals on wheels program She does not like greens unfortunately (does eat broccoli)  27.65 kg/m   dexa - hx of osteopenia Declines a test  Takes her vitamin D   Tetanus shot 9/05  Flu shot 10/18 Had both PNA vaccines   zostavax 5/17  Hx of breast cancer with L mastectomy in the past  Mammogram 2016- last one  Does not want to get one any longer  S/p mastectomy L  Oncology let her loose  She was on an aromatase inhibitor and finished it   bp is improved  No cp or palpitations or headaches or edema  No side effects to medicines  BP Readings from Last 3 Encounters:  03/20/17 (!) 142/64  01/01/17 (!) 184/75  12/11/16 (!) 168/70    Checks at home occasionally  Takes losartan hct    Hypothyroidism  Pt has no clinical changes No change in energy level/ hair or skin/ edema and no tremor Lab Results  Component Value Date   TSH 1.69 05/21/2016      Hyperlipidemia Lab Results  Component Value Date   CHOL 171 05/21/2016   CHOL 168 05/01/2015   CHOL 177 09/02/2013   Lab Results  Component Value Date   HDL 58.20 05/21/2016   HDL 53.50 05/01/2015   HDL 56.30  09/02/2013   Lab Results  Component Value Date   LDLCALC 95 05/21/2016   LDLCALC 97 05/01/2015   LDLCALC 99 09/02/2013   Lab Results  Component Value Date   TRIG 87.0 05/21/2016   TRIG 89.0 05/01/2015   TRIG 109.0 09/02/2013   Lab Results  Component Value Date   CHOLHDL 3 05/21/2016   CHOLHDL 3 05/01/2015   CHOLHDL 3 09/02/2013   No results found for: LDLDIRECT Diet controlled   Hyperglycemia Lab Results  Component Value Date   HGBA1C 6.0 05/21/2016  this was down from 6.1  Patient Active Problem List   Diagnosis Date Noted  . Macular degeneration 03/20/2017  . Thickened nails 03/20/2017  . History of rectal bleeding 05/21/2016  . Eczema 05/16/2014  . Left hip pain 02/15/2014  . Spinal stenosis of lumbar region 02/15/2014  . Right foot pain 09/23/2013  . Right shoulder pain 09/23/2013  . Other screening mammogram 04/12/2013  . Falls frequently 05/21/2012  . Urge incontinence 12/26/2011  . Hip pain, bilateral 09/15/2011  . Special screening for malignant neoplasms, colon 07/10/2010  . Hypothyroidism 04/30/2010  . CONSTIPATION 04/30/2010  .  STRESS REACTION, ACUTE, WITH EMOTIONAL DISTURBANCE 10/09/2009  . HYPERGLYCEMIA 07/22/2009  . Malignant neoplasm of female breast ([place]) 12/29/2007  . LEG PAIN, BILATERAL 07/01/2007  . BACK PAIN 06/22/2006  . PLUMMER'S DISEASE 06/16/2006  . Hyperlipidemia 06/16/2006  . ANXIETY 06/16/2006  . Essential hypertension 06/16/2006  . Osteopenia 06/16/2006   Past Medical History:  Diagnosis Date  . Anxiety 12/2002  . Cancer ([place])    breast left arm  . Cancer ([place])    uterine  . Diabetes mellitus 11/2004   Type 2  . Dyspnea    WITH EXERTION  . GERD (gastroesophageal reflux disease)   . Hyperlipidemia 11/2004  . Hypertension 12/2002  . Hypothyroidism   . Osteopenia 02/2002   02/2002 Dexa osteopenia// Dexa 02/26/2004 (Dr. Ubaldo Glassing) stable/IMR femur/spine  . Spinal stenosis   . Thyroid disease    Hypothryoid after radio  ablation of thyroid// Dr. Ronnald Collum endocrinologist  . Vertigo    Past Surgical History:  Procedure Laterality Date  . ABDOMINAL HYSTERECTOMY  1990   endometrial adenoca  . BREAST SURGERY  1980   left breast lumpectomy //abscess of breast evac.  Marland Kitchen CATARACT EXTRACTION W/PHACO Left 12/11/2016   Procedure: CATARACT EXTRACTION PHACO AND INTRAOCULAR LENS PLACEMENT (IOC);  Surgeon: Eulogio Bear, MD;  Location: [facility] ORS;  Service: Ophthalmology;  Laterality: Left;  [place] 1:18.4 AP 36.6% CDE 16.13  . CATARACT EXTRACTION W/PHACO Right 01/01/2017   Procedure: CATARACT EXTRACTION PHACO AND INTRAOCULAR LENS PLACEMENT (IOC);  Surgeon: Eulogio Bear, MD;  Location: [facility] ORS;  Service: Ophthalmology;  Laterality: Right;  [place]: 01:45.5 AP% 16.6 CDE 18.21 Fluid pack lot # [id number]  . CHOLECYSTECTOMY  07/10/1987  . EYE SURGERY     Tear Duct surgery  . MASTECTOMY Left    Social History   Tobacco Use  . Smoking status: Never Smoker  . Smokeless tobacco: Never Used  Substance Use Topics  . Alcohol use: No    Alcohol/week: 0.0 oz  . Drug use: No   Family History  Problem Relation Age of Onset  . Alzheimer's disease Mother   . Heart disease Mother 36       angina  . Hypertension Mother   . Crohn's disease Mother        ? crohns disease  . Cancer Father 41       colon cancer  . Cancer Sister 72       breast cancer   . COPD Sister        emphysema (smoker)  . Heart disease Sister        angina  . Alcohol abuse Brother   . Diabetes Paternal Aunt   . Hypertension Sister   . Stroke Neg Hx    Allergies  Allergen Reactions  . Amlodipine Other (See Comments)    Leg edema   . Atenolol Other (See Comments)    REACTION: bradycardia  . Erythromycin Base Other (See Comments)    REACTION: stomach upset  . Lisinopril Other (See Comments)    REACTION: cough  (20 mg)   Current Outpatient Medications on File Prior to Visit  Medication Sig Dispense Refill  . aspirin 81 MG tablet Take 81  mg by mouth daily. Daily with food     . cholecalciferol (VITAMIN D) 1000 UNITS tablet Take 1,000 Units by mouth daily.      . mometasone (ELOCON) 0.1 % cream Apply 1 application topically daily. To affected areas behind ears 30 g 1  . polyethylene glycol powder (  GLYCOLAX/MIRALAX) powder Take by mouth daily as needed. As directed for constipation     . vitamin C (ASCORBIC ACID) 500 MG tablet Take 500 mg by mouth daily.     No current facility-administered medications on file prior to visit.     Review of Systems  Constitutional: Negative for activity change, appetite change, fatigue, fever and unexpected weight change.  HENT: Negative for congestion, ear pain, rhinorrhea, sinus pressure and sore throat.   Eyes: Positive for visual disturbance. Negative for pain and redness.  Respiratory: Negative for cough, shortness of breath and wheezing.   Cardiovascular: Negative for chest pain and palpitations.  Gastrointestinal: Negative for abdominal pain, blood in stool, constipation and diarrhea.  Endocrine: Negative for polydipsia and polyuria.  Genitourinary: Negative for dysuria, frequency and urgency.  Musculoskeletal: Positive for arthralgias. Negative for back pain and myalgias.       Pos for arthritis aches and pains   Skin: Negative for pallor and rash.  Allergic/Immunologic: Negative for environmental allergies.  Neurological: Negative for dizziness, syncope and headaches.  Hematological: Negative for adenopathy. Does not bruise/bleed easily.  Psychiatric/Behavioral: Negative for decreased concentration and dysphoric mood. The patient is not nervous/anxious.        Objective:   Physical Exam  Constitutional: She appears well-developed and well-nourished. No distress.  Well appearing elderly female   HENT:  Head: Normocephalic and atraumatic.  Right Ear: External ear normal.  Left Ear: External ear normal.  Nose: Nose normal.  Mouth/Throat: Oropharynx is clear and moist.  Eyes:  Conjunctivae and EOM are normal. Pupils are equal, round, and reactive to light. Right eye exhibits no discharge. Left eye exhibits no discharge. No scleral icterus.  Neck: Normal range of motion. Neck supple. No JVD present. Carotid bruit is not present. No thyromegaly present.  Cardiovascular: Normal rate, regular rhythm, normal heart sounds and intact distal pulses. Exam reveals no gallop.  Pulmonary/Chest: Effort normal and breath sounds normal. No respiratory distress. She has no wheezes. She has no rales.  Abdominal: Soft. Bowel sounds are normal. She exhibits no distension and no mass. There is no tenderness.  Genitourinary:  Genitourinary Comments: R breast: Breast exam: No mass, nodules, thickening, tenderness, bulging, retraction, inflamation, nipple discharge or skin changes noted.  No axillary or clavicular LA.     Mastectomy on the L side   Musculoskeletal: She exhibits no edema or tenderness.  Mild kyphosis   Lymphadenopathy:    She has no cervical adenopathy.  Neurological: She is alert. She has normal reflexes. No cranial nerve deficit. She exhibits normal muscle tone. Coordination normal.  Skin: Skin is warm and dry. No rash noted. No erythema. No pallor.  Fair complexion Some lentigines and sks   Long dystrophic toenails noted   Psychiatric: She has a normal mood and affect.  Mentally sharp           Assessment & Plan:   Problem List Items Addressed This Visit      Cardiovascular and Mediastinum   Essential hypertension - Primary    bp in fair control at this time  (improved from last reading with some white coat component)  BP Readings from Last 1 Encounters:  03/20/17 (!) 142/64   No changes needed Disc lifstyle change with low sodium diet and exercise         Relevant Medications   losartan-hydrochlorothiazide (HYZAAR) 100-25 MG tablet   Other Relevant Orders   CBC with Differential/Platelet (Completed)   Comprehensive metabolic panel (Completed)  Endocrine   Hypothyroidism    Hypothyroidism  Pt has no clinical changes No change in energy level/ hair or skin/ edema and no tremor Lab Results  Component Value Date   TSH 1.68 03/20/2017          Relevant Medications   levothyroxine (SYNTHROID, LEVOTHROID) 50 MCG tablet   Other Relevant Orders   TSH (Completed)     Musculoskeletal and Integument   Osteopenia    Pt declines dexa or tx  No falls or fractures  Disc need for calcium/ vitamin D/ wt bearing exercise and bone density test every 2 y to monitor Disc safety/ fracture risk in detail           Other   HYPERGLYCEMIA    Well controlled with diet  Lab Results  Component Value Date   HGBA1C 5.8 (H) 03/20/2017   disc imp of low glycemic diet and wt loss to prevent DM2       Relevant Orders   Hemoglobin A1c (Completed)   Hyperlipidemia    Disc goals for lipids and reasons to control them Rev labs with pt Rev low sat fat diet in detail Diet controlled       Relevant Medications   losartan-hydrochlorothiazide (HYZAAR) 100-25 MG tablet   Other Relevant Orders   Lipid Panel (Completed)   Macular degeneration    Gradually worsening Does not drive- depends on son and other family member for transportation  Also cannot read (sometimes large font is better) Printed AVS in large font  Given booklet on adv dir for a family member to read to her        Malignant neoplasm of female breast ([place])    Doing well/out of window for f/u with L mastectomy in the past She declines further monitor/screening or tx at her age       Thickened nails    Needs a toe nail trim- strongly recommend podiatry visit  She wants to make her own appt due to transportation issues

## 2017-03-20 NOTE — Patient Instructions (Addendum)
Keep taking vitamin D for bone health   Also stay active (safely)    You are due for a tetanus shot - look into getting one at the pharmacy   If you change your mind about a mammogram -please let us know   Take a look at the information on living will and power of attorney (have someone read it to you) and complete it for [place] when you can   You need to have your toe nails trimmed - the [facility] can help you  Make your own appointment  (if you have any trouble with that we can do a referral- just call us)   Take care of yourself !

## 2017-03-21 LAB — COMPREHENSIVE METABOLIC PANEL
AG Ratio: 1.5 (calc) (ref 1.0–2.5)
ALBUMIN MSPROF: 4.1 g/dL (ref 3.6–5.1)
ALKALINE PHOSPHATASE (APISO): 84 U/L (ref 33–130)
ALT: 12 U/L (ref 6–29)
AST: 16 U/L (ref 10–35)
BUN: 20 mg/dL (ref 7–25)
CHLORIDE: 106 mmol/L (ref 98–110)
CO2: 28 mmol/L (ref 20–32)
CREATININE: 0.85 mg/dL (ref 0.60–0.88)
Calcium: 9.4 mg/dL (ref 8.6–10.4)
GLOBULIN: 2.7 g/dL (ref 1.9–3.7)
GLUCOSE: 99 mg/dL (ref 65–99)
POTASSIUM: 4.4 mmol/L (ref 3.5–5.3)
Sodium: 143 mmol/L (ref 135–146)
TOTAL PROTEIN: 6.8 g/dL (ref 6.1–8.1)
Total Bilirubin: 0.6 mg/dL (ref 0.2–1.2)

## 2017-03-21 LAB — HEMOGLOBIN A1C
Hgb A1c MFr Bld: 5.8 % of total Hgb — ABNORMAL HIGH (ref <5.7)
Mean Plasma Glucose: 120 (calc)
eAG (mmol/L): 6.6 (calc)

## 2017-03-21 LAB — CBC WITH DIFFERENTIAL/PLATELET
BASOS ABS: 99 {cells}/uL (ref 0–200)
BASOS PCT: 1.1 %
Eosinophils Absolute: 261 cells/uL (ref 15–500)
Eosinophils Relative: 2.9 %
HCT: 38.4 % (ref 35.0–45.0)
HEMOGLOBIN: 13.1 g/dL (ref 11.7–15.5)
LYMPHS ABS: 2394 {cells}/uL (ref 850–3900)
MCH: 30.3 pg (ref 27.0–33.0)
MCHC: 34.1 g/dL (ref 32.0–36.0)
MCV: 88.9 fL (ref 80.0–100.0)
MONOS PCT: 9.8 %
MPV: 10.4 fL (ref 7.5–12.5)
Neutro Abs: 5364 cells/uL (ref 1500–7800)
Neutrophils Relative %: 59.6 %
Platelets: 339 10*3/uL (ref 140–400)
RBC: 4.32 10*6/uL (ref 3.80–5.10)
RDW: 13.3 % (ref 11.0–15.0)
TOTAL LYMPHOCYTE: 26.6 %
WBC mixed population: 882 cells/uL (ref 200–950)
WBC: 9 10*3/uL (ref 3.8–10.8)

## 2017-03-21 LAB — LIPID PANEL
CHOL/HDL RATIO: 2.6 (calc) (ref <5.0)
Cholesterol: 180 mg/dL (ref <200)
HDL: 69 mg/dL (ref >50)
LDL CHOLESTEROL (CALC): 95 mg/dL
NON-HDL CHOLESTEROL (CALC): 111 mg/dL (ref <130)
Triglycerides: 73 mg/dL (ref <150)

## 2017-03-21 LAB — TSH: TSH: 1.68 mIU/L (ref 0.40–4.50)

## 2017-03-22 NOTE — Assessment & Plan Note (Signed)
Pt declines dexa or tx  No falls or fractures  Disc need for calcium/ vitamin D/ wt bearing exercise and bone density test every 2 y to monitor Disc safety/ fracture risk in detail

## 2017-03-22 NOTE — Assessment & Plan Note (Signed)
Needs a toe nail trim- strongly recommend podiatry visit  She wants to make her own appt due to transportation issues

## 2017-03-22 NOTE — Assessment & Plan Note (Signed)
Hypothyroidism  Pt has no clinical changes No change in energy level/ hair or skin/ edema and no tremor Lab Results  Component Value Date   TSH 1.68 03/20/2017

## 2017-03-22 NOTE — Assessment & Plan Note (Signed)
bp in fair control at this time  (improved from last reading with some white coat component)  BP Readings from Last 1 Encounters:  03/20/17 (!) 142/64   No changes needed Disc lifstyle change with low sodium diet and exercise

## 2017-03-22 NOTE — Assessment & Plan Note (Signed)
Disc goals for lipids and reasons to control them Rev labs with pt Rev low sat fat diet in detail Diet controlled

## 2017-03-22 NOTE — Assessment & Plan Note (Signed)
Gradually worsening Does not drive- depends on son and other family member for transportation  Also cannot read (sometimes large font is better) Printed AVS in large font  Given booklet on adv dir for a family member to read to her

## 2017-03-22 NOTE — Assessment & Plan Note (Signed)
Well controlled with diet  Lab Results  Component Value Date   HGBA1C 5.8 (H) 03/20/2017   disc imp of low glycemic diet and wt loss to prevent DM2

## 2017-03-22 NOTE — Assessment & Plan Note (Signed)
Doing well/out of window for f/u with L mastectomy in the past She declines further monitor/screening or tx at her age

## 2017-03-23 ENCOUNTER — Encounter: Payer: Self-pay | Admitting: *Deleted

## 2017-03-25 ENCOUNTER — Encounter: Payer: BLUE CROSS/BLUE SHIELD | Admitting: Family Medicine

## 2017-03-26 ENCOUNTER — Other Ambulatory Visit: Payer: Self-pay | Admitting: Family Medicine

## 2017-03-26 MED ORDER — MOMETASONE FUROATE 0.1 % EX CREA: 1.0000 "application " | TOPICAL_CREAM | Freq: Every day | CUTANEOUS | 1 refills | Status: DC

## 2017-03-26 MED ORDER — POLYETHYLENE GLYCOL 3350 17 GM/SCOOP PO POWD: 17.0000 g | Freq: Every day | ORAL | 3 refills | Status: DC | PRN

## 2017-03-26 NOTE — Telephone Encounter (Signed)
Will refill electronically  

## 2017-03-26 NOTE — Telephone Encounter (Signed)
Pt was seen 03/20/17. Do not see from hx or current med list where Dr Glori Bickers has ever filled Polyethylene glycol powder and mometasone last refilled # 30 g x 1 on 05/16/14. Please advise.  [name].

## 2017-03-26 NOTE — Telephone Encounter (Signed)
Copied from [place] [id number]. Topic: Quick Communication - See Telephone Encounter >> Mar 26, 2017  2:12 PM Ivar Drape wrote: CRM for notification. See Telephone encounter for:  03/26/17. [name] w/[facility] [phone] said the patient was in the pharmacy as we spoke and she was expecting two additional medications to be sent in to the pharmacist.  1) Polyethylene Glycol Powder and 2) Mometasone (Elocon).  They need these medications sent ASAP.

## 2017-04-16 ENCOUNTER — Ambulatory Visit (INDEPENDENT_AMBULATORY_CARE_PROVIDER_SITE_OTHER): Payer: Medicare Other | Admitting: Podiatry

## 2017-04-16 ENCOUNTER — Encounter: Payer: Self-pay | Admitting: Podiatry

## 2017-04-16 DIAGNOSIS — M79675 Pain in left toe(s): Secondary | ICD-10-CM

## 2017-04-16 DIAGNOSIS — M79674 Pain in right toe(s): Secondary | ICD-10-CM | POA: Diagnosis not present

## 2017-04-16 DIAGNOSIS — B351 Tinea unguium: Secondary | ICD-10-CM

## 2017-04-16 NOTE — Progress Notes (Addendum)
Complaint:  Visit Type: Patient returns to my office for continued preventative foot care services. Complaint: Patient states" my nails have grown long and thick and become painful to walk and wear shoes" The patient presents for preventative foot care services. No changes to ROS.  Patient has not been seen in 7 months.  Podiatric Exam: Vascular: dorsalis pedis and posterior tibial pulses are palpable bilateral. Capillary return is immediate. Temperature gradient is WNL. Skin turgor WNL  Sensorium: Normal Semmes Weinstein monofilament test. Normal tactile sensation bilaterally. Nail Exam: Pt has thick disfigured discolored nails with subungual debris noted bilateral entire nail hallux through fifth toenails Ulcer Exam: There is no evidence of ulcer or pre-ulcerative changes or infection. Orthopedic Exam: Muscle tone and strength are WNL. No limitations in general ROM. No crepitus or effusions noted. Foot type and digits show no abnormalities. Bony prominences are unremarkable. Skin: No Porokeratosis. No infection or ulcers  Diagnosis:  Onychomycosis, , Pain in right toe, pain in left toes  Treatment & Plan Procedures and Treatment: Consent by patient was obtained for treatment procedures. The patient understood the discussion of treatment and procedures well. All questions were answered thoroughly reviewed. Debridement of mycotic and hypertrophic toenails, 1 through 5 bilateral and clearing of subungual debris. No ulceration, no infection noted. ABN signed for 2019. Return Visit-Office Procedure: Patient instructed to return to the office for a follow up visit 3 months for continued evaluation and treatment.    Gardiner Barefoot DPM

## 2017-04-21 DIAGNOSIS — H353122 Nonexudative age-related macular degeneration, left eye, intermediate dry stage: Secondary | ICD-10-CM | POA: Diagnosis not present

## 2017-04-21 DIAGNOSIS — H353211 Exudative age-related macular degeneration, right eye, with active choroidal neovascularization: Secondary | ICD-10-CM | POA: Diagnosis not present

## 2017-06-01 DIAGNOSIS — H26499 Other secondary cataract, unspecified eye: Secondary | ICD-10-CM | POA: Diagnosis not present

## 2017-06-25 DIAGNOSIS — H26491 Other secondary cataract, right eye: Secondary | ICD-10-CM | POA: Diagnosis not present

## 2017-07-07 DIAGNOSIS — H353211 Exudative age-related macular degeneration, right eye, with active choroidal neovascularization: Secondary | ICD-10-CM | POA: Diagnosis not present

## 2017-07-16 ENCOUNTER — Ambulatory Visit: Payer: Medicare Other | Admitting: Podiatry

## 2017-07-17 ENCOUNTER — Ambulatory Visit (INDEPENDENT_AMBULATORY_CARE_PROVIDER_SITE_OTHER): Payer: Medicare Other | Admitting: Podiatry

## 2017-07-17 DIAGNOSIS — M79675 Pain in left toe(s): Secondary | ICD-10-CM

## 2017-07-17 DIAGNOSIS — B351 Tinea unguium: Secondary | ICD-10-CM

## 2017-07-17 DIAGNOSIS — M79674 Pain in right toe(s): Secondary | ICD-10-CM | POA: Diagnosis not present

## 2017-07-20 NOTE — Progress Notes (Signed)
   SUBJECTIVE Patient presents to office today complaining of elongated, thickened nails that cause pain while ambulating in shoes. She is unable to trim her own nails. Patient is here for further evaluation and treatment.  Past Medical History:  Diagnosis Date  . Anxiety 12/2002  . Cancer (HCC)    breast left arm  . Cancer (HCC)    uterine  . Diabetes mellitus 11/2004   Type 2  . Dyspnea    WITH EXERTION  . GERD (gastroesophageal reflux disease)   . Hyperlipidemia 11/2004  . Hypertension 12/2002  . Hypothyroidism   . Osteopenia 02/2002   02/2002 Dexa osteopenia// Dexa 02/26/2004 (Dr. Lomax) stable/IMR femur/spine  . Spinal stenosis   . 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 Morayati endocrinologist  . Vertigo     OBJECTIVE General Patient is awake, alert, and oriented x 3 and in no acute distress. Derm Skin is dry and supple bilateral. Negative open lesions or macerations. Remaining integument unremarkable. Nails are tender, long, thickened and dystrophic with subungual debris, consistent with onychomycosis, 1-5 bilateral. No signs of infection noted. Vasc  DP and PT pedal pulses palpable bilaterally. Temperature gradient within normal limits.  Neuro Epicritic and protective threshold sensation grossly intact bilaterally.  Musculoskeletal Exam No symptomatic pedal deformities noted bilateral. Muscular strength within normal limits.  ASSESSMENT 1. Onychodystrophic nails 1-5 bilateral with hyperkeratosis of nails.  2. Onychomycosis of nail due to dermatophyte bilateral 3. Pain in foot bilateral  PLAN OF CARE 1. Patient evaluated today.  2. Instructed to maintain good pedal hygiene and foot care.  3. Mechanical debridement of nails 1-5 bilaterally performed using a nail nipper. Filed with dremel without incident.  4. Return to clinic in 3 mos.    Brent M. Evans, DPM [facility]  Dr. Brent M. Evans, DPM    [address]                                        [place], [place] [address]                Office [phone]  Fax [phone]     

## 2017-09-28 DIAGNOSIS — H353122 Nonexudative age-related macular degeneration, left eye, intermediate dry stage: Secondary | ICD-10-CM | POA: Diagnosis not present

## 2017-09-28 DIAGNOSIS — H353211 Exudative age-related macular degeneration, right eye, with active choroidal neovascularization: Secondary | ICD-10-CM | POA: Diagnosis not present

## 2017-10-15 ENCOUNTER — Ambulatory Visit (INDEPENDENT_AMBULATORY_CARE_PROVIDER_SITE_OTHER): Payer: Medicare Other | Admitting: Family Medicine

## 2017-10-15 ENCOUNTER — Ambulatory Visit (INDEPENDENT_AMBULATORY_CARE_PROVIDER_SITE_OTHER)
Admission: RE | Admit: 2017-10-15 | Discharge: 2017-10-15 | Disposition: A | Discharge status: Home / Self Care | Payer: Medicare Other | Source: Ambulatory Visit | Attending: Family Medicine | Admitting: Family Medicine

## 2017-10-15 ENCOUNTER — Encounter: Payer: Self-pay | Admitting: Family Medicine

## 2017-10-15 ENCOUNTER — Telehealth: Payer: Self-pay | Admitting: Family Medicine

## 2017-10-15 VITALS — BP 142/74 | HR 61 | Temp 97.9°F

## 2017-10-15 DIAGNOSIS — S300XXA Contusion of lower back and pelvis, initial encounter: Secondary | ICD-10-CM | POA: Diagnosis not present

## 2017-10-15 DIAGNOSIS — S3993XA Unspecified injury of pelvis, initial encounter: Secondary | ICD-10-CM | POA: Diagnosis not present

## 2017-10-15 DIAGNOSIS — S3992XA Unspecified injury of lower back, initial encounter: Secondary | ICD-10-CM

## 2017-10-15 DIAGNOSIS — Y92009 Unspecified place in unspecified non-institutional (private) residence as the place of occurrence of the external cause: Secondary | ICD-10-CM

## 2017-10-15 DIAGNOSIS — R296 Repeated falls: Secondary | ICD-10-CM

## 2017-10-15 DIAGNOSIS — R52 Pain, unspecified: Secondary | ICD-10-CM | POA: Diagnosis not present

## 2017-10-15 DIAGNOSIS — M7918 Myalgia, other site: Secondary | ICD-10-CM | POA: Diagnosis not present

## 2017-10-15 DIAGNOSIS — W19XXXA Unspecified fall, initial encounter: Secondary | ICD-10-CM

## 2017-10-15 NOTE — Telephone Encounter (Signed)
Ok if ok with PCP  Please sch

## 2017-10-15 NOTE — Telephone Encounter (Signed)
Pt is requesting to transfer care from Dr. Glori Bickers to Dr. Aundra DubinGildardo Pounds due to lacation. OK to transfer?

## 2017-10-15 NOTE — Progress Notes (Signed)
Subjective:    Patient ID: [name], female    DOB: Nov 11, 1926, 84 y.o.   MRN: [id number]  HPI Here for injuries from fall on 10/02/17  84 yo with past hx of falls/ as well as spinal stenosis   She moved a chair and thought it was still there Went to sit on it and fell on buttocks and then hit head on the back  Had to lie there for a while - saw stars and room was spinning  ? If she had a concussion- was a little confused  No headache - has had fleeting pains in head occ (predated the fall)  No n/v  Poor balance but not dizzy  Spot on R post still sore to the touch -- no cuts or bleeding in scalp   Buttocks hurt and R side of low back and area below ribs   She called her son - then was able to get up  Thought she was ok   Now she has a big bruise on R buttock - and side/above  Family made her come in today   Undergoing tx for macular deg/ poor vision   Patient Active Problem List   Diagnosis Date Noted  . Fall at home 10/15/2017  . Buttock pain 10/15/2017  . Macular degeneration 03/20/2017  . Thickened nails 03/20/2017  . History of rectal bleeding 05/21/2016  . Eczema 05/16/2014  . Spinal stenosis of lumbar region 02/15/2014  . Right foot pain 09/23/2013  . Right shoulder pain 09/23/2013  . Other screening mammogram 04/12/2013  . Falls frequently 05/21/2012  . Urge incontinence 12/26/2011  . Hip pain, bilateral 09/15/2011  . Special screening for malignant neoplasms, colon 07/10/2010  . Hypothyroidism 04/30/2010  . CONSTIPATION 04/30/2010  . STRESS REACTION, ACUTE, WITH EMOTIONAL DISTURBANCE 10/09/2009  . HYPERGLYCEMIA 07/22/2009  . Malignant neoplasm of female breast ([place]) 12/29/2007  . BACK PAIN 06/22/2006  . PLUMMER'S DISEASE 06/16/2006  . Hyperlipidemia 06/16/2006  . ANXIETY 06/16/2006  . Essential hypertension 06/16/2006  . Osteopenia 06/16/2006   Past Medical History:  Diagnosis Date  . Anxiety 12/2002  . Cancer ([place])    breast left arm  .  Cancer ([place])    uterine  . Diabetes mellitus 11/2004   Type 2  . Dyspnea    WITH EXERTION  . GERD (gastroesophageal reflux disease)   . Hyperlipidemia 11/2004  . Hypertension 12/2002  . Hypothyroidism   . Osteopenia 02/2002   02/2002 Dexa osteopenia// Dexa 02/26/2004 (Dr. Ubaldo Glassing) stable/IMR femur/spine  . Spinal stenosis   . 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 Ronnald Collum endocrinologist  . Vertigo    Past Surgical History:  Procedure Laterality Date  . ABDOMINAL HYSTERECTOMY  1990   endometrial adenoca  . BREAST SURGERY  1980   left breast lumpectomy //abscess of breast evac.  Marland Kitchen CATARACT EXTRACTION W/PHACO Left 12/11/2016   Procedure: CATARACT EXTRACTION PHACO AND INTRAOCULAR LENS PLACEMENT (IOC);  Surgeon: Eulogio Bear, MD;  Location: [facility] ORS;  Service: Ophthalmology;  Laterality: Left;  [place] 1:18.4 AP 36.6% CDE 16.13  . CATARACT EXTRACTION W/PHACO Right 01/01/2017   Procedure: CATARACT EXTRACTION PHACO AND INTRAOCULAR LENS PLACEMENT (IOC);  Surgeon: Eulogio Bear, MD;  Location: [facility] ORS;  Service: Ophthalmology;  Laterality: Right;  [place]: 01:45.5 AP% 16.6 CDE 18.21 Fluid pack lot # [id number]  . CHOLECYSTECTOMY  07/10/1987  . EYE SURGERY     Tear Duct surgery  . MASTECTOMY Left  Social History   Tobacco Use  . Smoking status: Never Smoker  . Smokeless tobacco: Never Used  Substance Use Topics  . Alcohol use: No    Alcohol/week: 0.0 standard drinks  . Drug use: No   Family History  Problem Relation Age of Onset  . Alzheimer's disease Mother   . Heart disease Mother 47       angina  . Hypertension Mother   . Crohn's disease Mother        ? crohns disease  . Cancer Father 72       colon cancer  . Cancer Sister 40       breast cancer   . COPD Sister        emphysema (smoker)  . Heart disease Sister        angina  . Alcohol abuse Brother   . Diabetes Paternal Aunt   . Hypertension Sister   . Stroke Neg Hx     Allergies  Allergen Reactions  . Amlodipine Other (See Comments)    Leg edema   . Atenolol Other (See Comments)    REACTION: bradycardia  . Erythromycin Base Other (See Comments)    REACTION: stomach upset  . Lisinopril Other (See Comments)    REACTION: cough  (20 mg)   Current Outpatient Medications on File Prior to Visit  Medication Sig Dispense Refill  . aspirin 81 MG tablet Take 81 mg by mouth daily. Daily with food     . cholecalciferol (VITAMIN D) 1000 UNITS tablet Take 1,000 Units by mouth daily.      Marland Kitchen gabapentin (NEURONTIN) 100 MG capsule TAKE ONE (1) CAPSULE BY MOUTH 2 TIMES DAILY 180 capsule 3  . levothyroxine (SYNTHROID, LEVOTHROID) 50 MCG tablet Take 1 tablet (50 mcg total) by mouth daily. 90 tablet 3  . losartan-hydrochlorothiazide (HYZAAR) 100-25 MG tablet Take 1 tablet by mouth daily. 90 tablet 3  . mometasone (ELOCON) 0.1 % cream Apply 1 application topically daily. To affected areas behind ears 30 g 1  . polyethylene glycol powder (GLYCOLAX/MIRALAX) powder Take 17 g by mouth daily as needed. As directed for constipation 255 g 3  . vitamin C (ASCORBIC ACID) 500 MG tablet Take 500 mg by mouth daily.     No current facility-administered medications on file prior to visit.     Review of Systems  Constitutional: Positive for fatigue. Negative for activity change, appetite change, fever and unexpected weight change.  HENT: Negative for congestion, ear pain, rhinorrhea, sinus pressure and sore throat.   Eyes: Positive for visual disturbance. Negative for pain and redness.  Respiratory: Negative for cough, shortness of breath, wheezing and stridor.        Denies pleuritic pain   Some R lower lateral rib tenderness-does not hurt with breathing   Cardiovascular: Negative for chest pain, palpitations and leg swelling.  Gastrointestinal: Negative for abdominal pain, blood in stool, constipation, diarrhea and nausea.  Endocrine: Negative for polydipsia and polyuria.   Genitourinary: Negative for dysuria, frequency and urgency.  Musculoskeletal: Negative for arthralgias, back pain and myalgias.       Buttock and flank pain bilaterally with bruising R lower chest wall pain  Skin: Negative for pallor and rash.       Bruise on R buttock   Allergic/Immunologic: Negative for environmental allergies.  Neurological: Negative for dizziness, tremors, seizures, syncope, facial asymmetry, speech difficulty, weakness, light-headedness, numbness and headaches.       No longer dizzy  Hematological: Negative for adenopathy.  Does not bruise/bleed easily.  Psychiatric/Behavioral: Negative for decreased concentration and dysphoric mood. The patient is not nervous/anxious.        Objective:   Physical Exam  Constitutional: She is oriented to person, place, and time. She appears well-developed and well-nourished. No distress.  Frail but well appearing elderly female   HENT:  Head: Normocephalic and atraumatic.  Mouth/Throat: Oropharynx is clear and moist.  Slight tenderness over R post scalp w/o swelling or ecchymosis   Eyes: Pupils are equal, round, and reactive to light. Conjunctivae and EOM are normal.  Neck: Normal range of motion. Neck supple. No JVD present.  Cardiovascular: Normal rate, regular rhythm and normal heart sounds.  No murmur heard. Pulmonary/Chest: Effort normal and breath sounds normal. No stridor. No respiratory distress. She has no wheezes. She has no rales. She exhibits tenderness.  Tender over R posterolateral ribs No crepitus or skin change  Nl breath sounds with good air exch  Abdominal: Soft. Bowel sounds are normal. She exhibits no distension. There is no tenderness.  Musculoskeletal: Normal range of motion. She exhibits tenderness. She exhibits no edema or deformity.  Large ecchymosis of R buttock- is resolving (yellow in color)- no M noted  Mild tenderness of buttocks and hips  No spinous process tenderness  Can walk short distances  assisted and with cane   Lymphadenopathy:    She has no cervical adenopathy.  Neurological: She is alert and oriented to person, place, and time. She displays normal reflexes. No cranial nerve deficit. She exhibits normal muscle tone. Coordination normal.  Generally poor balance   Skin: Skin is warm and dry. Capillary refill takes less than 2 seconds. No rash noted. No pallor.  Psychiatric: Her behavior is normal. Thought content normal. Her mood appears anxious. Her affect is not blunt and not labile. Her speech is tangential. She does not exhibit a depressed mood.  Not confused but very tangential and difficult to interview at times Friend present helps with history          Assessment & Plan:   Problem List Items Addressed This Visit      Other   Buttock pain - Primary    Pain after a fall- across both buttocks /worse with walking  Contusion on R , some hip tenderness Will check pelvic xray to r/o fracture       Relevant Orders   DG Pelvis 1-2 Views   Contusion, buttock    R side- large but resolving  Recommend changing position slowly and ice (cold compress as needed)  Acetaminophen prn , aleve only if needed after that   Xray of pelvis today      Fall at home    Pt missed a chair-landed on buttocks and hit head on floor on 7/26 Friend made her come in today Re assuring exam (no concussion symptoms) , but will check pelvic xray in light of area of pain  Disc acetaminophen/ice for pain control  Long discussion about falls  She is very resistant to changing her home or use of a walker        Relevant Orders   DG Pelvis 1-2 Views   Falls frequently    Long discussion re: fall prevention  Brought by a friend today -helpful  She does not use a walker due to a step in home coming from current bedroom  She refuses to [facility] I feel she would be safer living with family or in asst living  Disc changing her bedroom  to another room to avoid the step -enabling her to  use walker (she has one) instead of cane  Also disc home health ref for balance PT -once xray is read  She may be open to this

## 2017-10-15 NOTE — Patient Instructions (Signed)
Your bruise looks to be getting better  For pain you can use tylenol regular strength 1-2 pills every 4-6 hours  You can try aleve if that does not work (take it with food)   We will do an xray of your pelvis to make sure there is not a fracture  We will call you about a result tomorrow   To prevent falls - use the walker instead of cane whenever possible  Switching bedrooms is a very good idea  It would be better if you lived with family or moved to an assisted living eventually   I will consult home health for physical therapy to prevent falls once the xray returns

## 2017-10-15 NOTE — Assessment & Plan Note (Signed)
Pt missed a chair-landed on buttocks and hit head on floor on 7/26 Friend made her come in today Re assuring exam (no concussion symptoms) , but will check pelvic xray in light of area of pain  Disc acetaminophen/ice for pain control  Long discussion about falls  She is very resistant to changing her home or use of a walker

## 2017-10-15 NOTE — Assessment & Plan Note (Signed)
Long discussion re: fall prevention  Brought by a friend today -helpful  She does not use a walker due to a step in home coming from current bedroom  She refuses to [facility] I feel she would be safer living with family or in asst living  Disc changing her bedroom to another room to avoid the step -enabling her to use walker (she has one) instead of cane  Also disc home health ref for balance PT -once xray is read  She may be open to this

## 2017-10-15 NOTE — Telephone Encounter (Signed)
That is fine  Her family can get her there much more easily for appointments

## 2017-10-15 NOTE — Assessment & Plan Note (Signed)
R side- large but resolving  Recommend changing position slowly and ice (cold compress as needed)  Acetaminophen prn , aleve only if needed after that   Xray of pelvis today

## 2017-10-15 NOTE — Assessment & Plan Note (Signed)
Pain after a fall- across both buttocks /worse with walking  Contusion on R , some hip tenderness Will check pelvic xray to r/o fracture

## 2017-10-16 ENCOUNTER — Telehealth: Payer: Self-pay | Admitting: Family Medicine

## 2017-10-16 DIAGNOSIS — R296 Repeated falls: Secondary | ICD-10-CM

## 2017-10-16 DIAGNOSIS — R2689 Other abnormalities of gait and mobility: Secondary | ICD-10-CM

## 2017-10-16 DIAGNOSIS — M48061 Spinal stenosis, lumbar region without neurogenic claudication: Secondary | ICD-10-CM

## 2017-10-16 NOTE — Telephone Encounter (Signed)
Referral done Will route to [facility]  

## 2017-10-16 NOTE — Telephone Encounter (Signed)
Can you call this pt to set up a transfer of care appt?

## 2017-10-16 NOTE — Telephone Encounter (Signed)
-----   Message from Tammi Sou, [place] sent at 10/16/2017 12:35 PM EDT ----- Pt notified of xray results and Dr. Marliss Coots comments and they agreed with referral for PT

## 2017-10-19 NOTE — Telephone Encounter (Signed)
I spoke to Mrs. [name] she stated she did not need an appointment at this time. She wanted to make sure that she was able to transfer to Dr. Terese Door and she would be her physician. I have changed her PCP to [facility].

## 2017-10-20 DIAGNOSIS — Z9181 History of falling: Secondary | ICD-10-CM | POA: Diagnosis not present

## 2017-10-20 DIAGNOSIS — M6281 Muscle weakness (generalized): Secondary | ICD-10-CM | POA: Diagnosis not present

## 2017-10-20 DIAGNOSIS — M48061 Spinal stenosis, lumbar region without neurogenic claudication: Secondary | ICD-10-CM | POA: Diagnosis not present

## 2017-10-20 DIAGNOSIS — I10 Essential (primary) hypertension: Secondary | ICD-10-CM | POA: Diagnosis not present

## 2017-10-20 DIAGNOSIS — M25551 Pain in right hip: Secondary | ICD-10-CM | POA: Diagnosis not present

## 2017-10-20 DIAGNOSIS — R2689 Other abnormalities of gait and mobility: Secondary | ICD-10-CM | POA: Diagnosis not present

## 2017-10-22 ENCOUNTER — Ambulatory Visit: Payer: BLUE CROSS/BLUE SHIELD | Admitting: Podiatry

## 2017-10-23 ENCOUNTER — Encounter: Payer: Self-pay | Admitting: Podiatry

## 2017-10-23 ENCOUNTER — Ambulatory Visit (INDEPENDENT_AMBULATORY_CARE_PROVIDER_SITE_OTHER): Payer: Medicare Other | Admitting: Podiatry

## 2017-10-23 DIAGNOSIS — M79674 Pain in right toe(s): Secondary | ICD-10-CM

## 2017-10-23 DIAGNOSIS — B351 Tinea unguium: Secondary | ICD-10-CM | POA: Diagnosis not present

## 2017-10-23 DIAGNOSIS — M79675 Pain in left toe(s): Secondary | ICD-10-CM

## 2017-10-23 NOTE — Progress Notes (Signed)
   SUBJECTIVE Patient presents to office today complaining of elongated, thickened nails that cause pain while ambulating in shoes. She is unable to trim her own nails. Patient is here for further evaluation and treatment.  Past Medical History:  Diagnosis Date  . Anxiety 12/2002  . Cancer (HCC)    breast left arm  . Cancer (HCC)    uterine  . Diabetes mellitus 11/2004   Type 2  . Dyspnea    WITH EXERTION  . GERD (gastroesophageal reflux disease)   . Hyperlipidemia 11/2004  . Hypertension 12/2002  . Hypothyroidism   . Osteopenia 02/2002   02/2002 Dexa osteopenia// Dexa 02/26/2004 (Dr. Lomax) stable/IMR femur/spine  . Spinal stenosis   . 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 Morayati endocrinologist  . Vertigo     OBJECTIVE General Patient is awake, alert, and oriented x 3 and in no acute distress. Derm Skin is dry and supple bilateral. Negative open lesions or macerations. Remaining integument unremarkable. Nails are tender, long, thickened and dystrophic with subungual debris, consistent with onychomycosis, 1-5 bilateral. No signs of infection noted. Vasc  DP and PT pedal pulses palpable bilaterally. Temperature gradient within normal limits.  Neuro Epicritic and protective threshold sensation grossly intact bilaterally.  Musculoskeletal Exam No symptomatic pedal deformities noted bilateral. Muscular strength within normal limits.  ASSESSMENT 1. Onychodystrophic nails 1-5 bilateral with hyperkeratosis of nails.  2. Onychomycosis of nail due to dermatophyte bilateral 3. Pain in foot bilateral  PLAN OF CARE 1. Patient evaluated today.  2. Instructed to maintain good pedal hygiene and foot care.  3. Mechanical debridement of nails 1-5 bilaterally performed using a nail nipper. Filed with dremel without incident.  4. Return to clinic in 3 mos.    Laylonie Marzec M. Dwayne Bulkley, DPM [facility]  Dr. Airyana Sprunger M. Ambur Province, DPM    [address]                                        [place], [place] [address]                Office [phone]  Fax [phone]     

## 2017-10-28 DIAGNOSIS — M48061 Spinal stenosis, lumbar region without neurogenic claudication: Secondary | ICD-10-CM | POA: Diagnosis not present

## 2017-10-28 DIAGNOSIS — Z9181 History of falling: Secondary | ICD-10-CM | POA: Diagnosis not present

## 2017-10-28 DIAGNOSIS — R2689 Other abnormalities of gait and mobility: Secondary | ICD-10-CM | POA: Diagnosis not present

## 2017-10-28 DIAGNOSIS — M25551 Pain in right hip: Secondary | ICD-10-CM | POA: Diagnosis not present

## 2017-10-28 DIAGNOSIS — I10 Essential (primary) hypertension: Secondary | ICD-10-CM | POA: Diagnosis not present

## 2017-10-28 DIAGNOSIS — M6281 Muscle weakness (generalized): Secondary | ICD-10-CM | POA: Diagnosis not present

## 2017-10-30 DIAGNOSIS — M48061 Spinal stenosis, lumbar region without neurogenic claudication: Secondary | ICD-10-CM | POA: Diagnosis not present

## 2017-10-30 DIAGNOSIS — M25551 Pain in right hip: Secondary | ICD-10-CM | POA: Diagnosis not present

## 2017-10-30 DIAGNOSIS — M6281 Muscle weakness (generalized): Secondary | ICD-10-CM | POA: Diagnosis not present

## 2017-10-30 DIAGNOSIS — I10 Essential (primary) hypertension: Secondary | ICD-10-CM | POA: Diagnosis not present

## 2017-10-30 DIAGNOSIS — R2689 Other abnormalities of gait and mobility: Secondary | ICD-10-CM | POA: Diagnosis not present

## 2017-10-30 DIAGNOSIS — Z9181 History of falling: Secondary | ICD-10-CM | POA: Diagnosis not present

## 2017-11-03 DIAGNOSIS — M48061 Spinal stenosis, lumbar region without neurogenic claudication: Secondary | ICD-10-CM | POA: Diagnosis not present

## 2017-11-03 DIAGNOSIS — R2689 Other abnormalities of gait and mobility: Secondary | ICD-10-CM | POA: Diagnosis not present

## 2017-11-03 DIAGNOSIS — M25551 Pain in right hip: Secondary | ICD-10-CM | POA: Diagnosis not present

## 2017-11-03 DIAGNOSIS — I10 Essential (primary) hypertension: Secondary | ICD-10-CM | POA: Diagnosis not present

## 2017-11-03 DIAGNOSIS — M6281 Muscle weakness (generalized): Secondary | ICD-10-CM | POA: Diagnosis not present

## 2017-11-03 DIAGNOSIS — Z9181 History of falling: Secondary | ICD-10-CM | POA: Diagnosis not present

## 2017-11-05 DIAGNOSIS — Z9181 History of falling: Secondary | ICD-10-CM | POA: Diagnosis not present

## 2017-11-05 DIAGNOSIS — R2689 Other abnormalities of gait and mobility: Secondary | ICD-10-CM | POA: Diagnosis not present

## 2017-11-05 DIAGNOSIS — I10 Essential (primary) hypertension: Secondary | ICD-10-CM | POA: Diagnosis not present

## 2017-11-05 DIAGNOSIS — M6281 Muscle weakness (generalized): Secondary | ICD-10-CM | POA: Diagnosis not present

## 2017-11-05 DIAGNOSIS — Z9012 Acquired absence of left breast and nipple: Secondary | ICD-10-CM

## 2017-11-05 DIAGNOSIS — M48061 Spinal stenosis, lumbar region without neurogenic claudication: Secondary | ICD-10-CM | POA: Diagnosis not present

## 2017-11-05 DIAGNOSIS — M25551 Pain in right hip: Secondary | ICD-10-CM | POA: Diagnosis not present

## 2017-11-10 DIAGNOSIS — M48061 Spinal stenosis, lumbar region without neurogenic claudication: Secondary | ICD-10-CM | POA: Diagnosis not present

## 2017-11-10 DIAGNOSIS — M25551 Pain in right hip: Secondary | ICD-10-CM | POA: Diagnosis not present

## 2017-11-10 DIAGNOSIS — Z9181 History of falling: Secondary | ICD-10-CM | POA: Diagnosis not present

## 2017-11-10 DIAGNOSIS — M6281 Muscle weakness (generalized): Secondary | ICD-10-CM | POA: Diagnosis not present

## 2017-11-10 DIAGNOSIS — R2689 Other abnormalities of gait and mobility: Secondary | ICD-10-CM | POA: Diagnosis not present

## 2017-11-10 DIAGNOSIS — I10 Essential (primary) hypertension: Secondary | ICD-10-CM | POA: Diagnosis not present

## 2017-11-11 DIAGNOSIS — I10 Essential (primary) hypertension: Secondary | ICD-10-CM | POA: Diagnosis not present

## 2017-11-11 DIAGNOSIS — M25551 Pain in right hip: Secondary | ICD-10-CM | POA: Diagnosis not present

## 2017-11-11 DIAGNOSIS — R2689 Other abnormalities of gait and mobility: Secondary | ICD-10-CM | POA: Diagnosis not present

## 2017-11-11 DIAGNOSIS — M6281 Muscle weakness (generalized): Secondary | ICD-10-CM | POA: Diagnosis not present

## 2017-11-11 DIAGNOSIS — Z9181 History of falling: Secondary | ICD-10-CM | POA: Diagnosis not present

## 2017-11-11 DIAGNOSIS — M48061 Spinal stenosis, lumbar region without neurogenic claudication: Secondary | ICD-10-CM | POA: Diagnosis not present

## 2017-11-17 DIAGNOSIS — M6281 Muscle weakness (generalized): Secondary | ICD-10-CM | POA: Diagnosis not present

## 2017-11-17 DIAGNOSIS — M25551 Pain in right hip: Secondary | ICD-10-CM | POA: Diagnosis not present

## 2017-11-17 DIAGNOSIS — M48061 Spinal stenosis, lumbar region without neurogenic claudication: Secondary | ICD-10-CM | POA: Diagnosis not present

## 2017-11-17 DIAGNOSIS — Z9181 History of falling: Secondary | ICD-10-CM | POA: Diagnosis not present

## 2017-11-17 DIAGNOSIS — R2689 Other abnormalities of gait and mobility: Secondary | ICD-10-CM | POA: Diagnosis not present

## 2017-11-17 DIAGNOSIS — I10 Essential (primary) hypertension: Secondary | ICD-10-CM | POA: Diagnosis not present

## 2017-11-19 DIAGNOSIS — I10 Essential (primary) hypertension: Secondary | ICD-10-CM | POA: Diagnosis not present

## 2017-11-19 DIAGNOSIS — M6281 Muscle weakness (generalized): Secondary | ICD-10-CM | POA: Diagnosis not present

## 2017-11-19 DIAGNOSIS — R2689 Other abnormalities of gait and mobility: Secondary | ICD-10-CM | POA: Diagnosis not present

## 2017-11-19 DIAGNOSIS — M48061 Spinal stenosis, lumbar region without neurogenic claudication: Secondary | ICD-10-CM | POA: Diagnosis not present

## 2017-11-19 DIAGNOSIS — M25551 Pain in right hip: Secondary | ICD-10-CM | POA: Diagnosis not present

## 2017-11-19 DIAGNOSIS — Z9181 History of falling: Secondary | ICD-10-CM | POA: Diagnosis not present

## 2017-11-20 NOTE — Telephone Encounter (Signed)
Patient would like to know when does she need to come in to actually see Dr [name]?

## 2017-11-20 NOTE — Telephone Encounter (Signed)
sch appt by 12/2017 early 01/2018   Thanks [employer]

## 2017-11-25 NOTE — Telephone Encounter (Signed)
Can you schedule this patient appointment.

## 2017-12-01 NOTE — Telephone Encounter (Signed)
Pt is scheduled °

## 2017-12-30 ENCOUNTER — Ambulatory Visit: Payer: BLUE CROSS/BLUE SHIELD | Admitting: Internal Medicine

## 2018-01-03 DIAGNOSIS — Z23 Encounter for immunization: Secondary | ICD-10-CM | POA: Diagnosis not present

## 2018-01-08 ENCOUNTER — Encounter: Payer: Self-pay | Admitting: Internal Medicine

## 2018-01-08 ENCOUNTER — Ambulatory Visit (INDEPENDENT_AMBULATORY_CARE_PROVIDER_SITE_OTHER): Payer: Medicare Other | Admitting: Internal Medicine

## 2018-01-08 ENCOUNTER — Other Ambulatory Visit: Payer: Self-pay | Admitting: Internal Medicine

## 2018-01-08 VITALS — BP 142/72 | HR 68 | Temp 97.8°F | Ht 59.5 in | Wt 138.6 lb

## 2018-01-08 DIAGNOSIS — I6523 Occlusion and stenosis of bilateral carotid arteries: Secondary | ICD-10-CM

## 2018-01-08 DIAGNOSIS — H353130 Nonexudative age-related macular degeneration, bilateral, stage unspecified: Secondary | ICD-10-CM | POA: Diagnosis not present

## 2018-01-08 DIAGNOSIS — M48061 Spinal stenosis, lumbar region without neurogenic claudication: Secondary | ICD-10-CM | POA: Diagnosis not present

## 2018-01-08 DIAGNOSIS — E559 Vitamin D deficiency, unspecified: Secondary | ICD-10-CM

## 2018-01-08 DIAGNOSIS — Z1329 Encounter for screening for other suspected endocrine disorder: Secondary | ICD-10-CM

## 2018-01-08 DIAGNOSIS — L989 Disorder of the skin and subcutaneous tissue, unspecified: Secondary | ICD-10-CM | POA: Diagnosis not present

## 2018-01-08 DIAGNOSIS — R296 Repeated falls: Secondary | ICD-10-CM

## 2018-01-08 DIAGNOSIS — Z1389 Encounter for screening for other disorder: Secondary | ICD-10-CM | POA: Diagnosis not present

## 2018-01-08 DIAGNOSIS — E039 Hypothyroidism, unspecified: Secondary | ICD-10-CM | POA: Diagnosis not present

## 2018-01-08 DIAGNOSIS — Z1283 Encounter for screening for malignant neoplasm of skin: Secondary | ICD-10-CM

## 2018-01-08 DIAGNOSIS — I10 Essential (primary) hypertension: Secondary | ICD-10-CM

## 2018-01-08 DIAGNOSIS — R42 Dizziness and giddiness: Secondary | ICD-10-CM

## 2018-01-08 DIAGNOSIS — I6529 Occlusion and stenosis of unspecified carotid artery: Secondary | ICD-10-CM | POA: Insufficient documentation

## 2018-01-08 LAB — CBC WITH DIFFERENTIAL/PLATELET
BASOS PCT: 1 % (ref 0.0–3.0)
Basophils Absolute: 0.1 10*3/uL (ref 0.0–0.1)
EOS PCT: 2.4 % (ref 0.0–5.0)
Eosinophils Absolute: 0.2 10*3/uL (ref 0.0–0.7)
HEMATOCRIT: 39.6 % (ref 36.0–46.0)
HEMOGLOBIN: 13.1 g/dL (ref 12.0–15.0)
LYMPHS PCT: 19.8 % (ref 12.0–46.0)
Lymphs Abs: 2 10*3/uL (ref 0.7–4.0)
MCHC: 33 g/dL (ref 30.0–36.0)
MCV: 93.2 fl (ref 78.0–100.0)
MONOS PCT: 8 % (ref 3.0–12.0)
Monocytes Absolute: 0.8 10*3/uL (ref 0.1–1.0)
Neutro Abs: 6.9 10*3/uL (ref 1.4–7.7)
Neutrophils Relative %: 68.8 % (ref 43.0–77.0)
Platelets: 312 10*3/uL (ref 150.0–400.0)
RBC: 4.25 Mil/uL (ref 3.87–5.11)
RDW: 15.3 % (ref 11.5–15.5)
WBC: 10 10*3/uL (ref 4.0–10.5)

## 2018-01-08 LAB — VITAMIN D 25 HYDROXY (VIT D DEFICIENCY, FRACTURES): VITD: 18.92 ng/mL — AB (ref 30.00–100.00)

## 2018-01-08 LAB — COMPREHENSIVE METABOLIC PANEL
ALT: 12 U/L (ref 0–35)
AST: 17 U/L (ref 0–37)
Albumin: 4.2 g/dL (ref 3.5–5.2)
Alkaline Phosphatase: 85 U/L (ref 39–117)
BUN: 22 mg/dL (ref 6–23)
CALCIUM: 9.4 mg/dL (ref 8.4–10.5)
CHLORIDE: 104 meq/L (ref 96–112)
CO2: 28 mEq/L (ref 19–32)
Creatinine, Ser: 0.94 mg/dL (ref 0.40–1.20)
GFR: 59.58 mL/min — ABNORMAL LOW (ref >60.00)
Glucose, Bld: 97 mg/dL (ref 70–99)
POTASSIUM: 4.1 meq/L (ref 3.5–5.1)
Sodium: 141 mEq/L (ref 135–145)
Total Bilirubin: 0.6 mg/dL (ref 0.2–1.2)
Total Protein: 7.2 g/dL (ref 6.0–8.3)

## 2018-01-08 LAB — TSH: TSH: 1.33 u[IU]/mL (ref 0.35–4.50)

## 2018-01-08 MED ORDER — CHOLECALCIFEROL 1.25 MG (50000 UT) PO CAPS: 50000.0000 [IU] | ORAL_CAPSULE | ORAL | 1 refills | Status: DC

## 2018-01-08 NOTE — Progress Notes (Signed)
Chief Complaint  Patient presents with  . Transitions Of Care   TOC with friend [name] today  1. Dizziness noted this am room was not spinning denies palpitations. She did change her glasses this am. She did have a sausage biscuit which she normally does not do and dizziness resolved for now. Orthostatics neg today BP 150/74 HR 67, sitting 154/70 HR 60, standing 158/78 HR 73 she has not taking hyzaar yet today 100-25 mg qd due to she normally takes at lunch. HTN initially 142/72 controlled for age  82. Requests handicap sticker form be filled out today  3. H/o falls 2 w/in the last 6 months and she did work with PT at home but no longer coming to the house  4. H/o DDD lumbar and spinal stenosis pt reports at times with standing legs feel weak  5. H/o hypothyroidism on levo 50 qd   Review of Systems  Constitutional: Negative for weight loss.  HENT: Negative for hearing loss.   Eyes: Negative for blurred vision.  Respiratory: Negative for shortness of breath.   Cardiovascular: Negative for chest pain and palpitations.  Gastrointestinal: Negative for abdominal pain.  Musculoskeletal: Positive for back pain. Negative for falls.  Skin: Negative for rash.  Neurological: Positive for dizziness.  Psychiatric/Behavioral: Negative for depression and memory loss.   Past Medical History:  Diagnosis Date  . Anxiety 12/2002  . Cancer ([place])    breast left arm  . Cancer ([place])    uterine  . Diabetes mellitus 11/2004   Type 2  . Dyspnea    WITH EXERTION  . GERD (gastroesophageal reflux disease)   . Hyperlipidemia 11/2004  . Hypertension 12/2002  . Hypothyroidism   . Osteopenia 02/2002   02/2002 Dexa osteopenia// Dexa 02/26/2004 (Dr. Ubaldo Glassing) stable/IMR femur/spine  . Spinal stenosis   . 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 Ronnald Collum endocrinologist  . Vertigo    Past Surgical History:  Procedure Laterality Date  . ABDOMINAL HYSTERECTOMY  1990   endometrial  adenoca  . BREAST SURGERY  1980   left breast lumpectomy //abscess of breast evac.  Marland Kitchen CATARACT EXTRACTION W/PHACO Left 12/11/2016   Procedure: CATARACT EXTRACTION PHACO AND INTRAOCULAR LENS PLACEMENT (IOC);  Surgeon: Eulogio Bear, MD;  Location: [facility] ORS;  Service: Ophthalmology;  Laterality: Left;  [place] 1:18.4 AP 36.6% CDE 16.13  . CATARACT EXTRACTION W/PHACO Right 01/01/2017   Procedure: CATARACT EXTRACTION PHACO AND INTRAOCULAR LENS PLACEMENT (IOC);  Surgeon: Eulogio Bear, MD;  Location: [facility] ORS;  Service: Ophthalmology;  Laterality: Right;  [place]: 01:45.5 AP% 16.6 CDE 18.21 Fluid pack lot # [id number]  . CHOLECYSTECTOMY  07/10/1987  . EYE SURGERY     Tear Duct surgery  . MASTECTOMY Left    Family History  Problem Relation Age of Onset  . Alzheimer's disease Mother   . Heart disease Mother 3       angina  . Hypertension Mother   . Crohn's disease Mother        ? crohns disease  . Cancer Father 26       colon cancer  . Cancer Sister 49       breast cancer   . COPD Sister        emphysema (smoker)  . Heart disease Sister        angina  . Alcohol abuse Brother   . Diabetes Paternal Aunt   . Hypertension Sister   . Stroke Neg Hx  Social History   Socioeconomic History  . Marital status: Married    Spouse name: Not on file  . Number of children: Not on file  . Years of education: Not on file  . Highest education level: Not on file  Occupational History  . Not on file  Social Needs  . Financial resource strain: Not on file  . Food insecurity:    Worry: Not on file    Inability: Not on file  . Transportation needs:    Medical: Not on file    Non-medical: Not on file  Tobacco Use  . Smoking status: Never Smoker  . Smokeless tobacco: Never Used  Substance and Sexual Activity  . Alcohol use: No    Alcohol/week: 0.0 standard drinks  . Drug use: No  . Sexual activity: Not on file  Lifestyle  . Physical activity:    Days per week: Not on file     Minutes per session: Not on file  . Stress: Not on file  Relationships  . Social connections:    Talks on phone: Not on file    Gets together: Not on file    Attends religious service: Not on file    Active member of club or organization: Not on file    Attends meetings of clubs or organizations: Not on file    Relationship status: Not on file  . Intimate partner violence:    Fear of current or ex partner: Not on file    Emotionally abused: Not on file    Physically abused: Not on file    Forced sexual activity: Not on file  Other Topics Concern  . Not on file  Social History Narrative   Takes care of husband full time who has alzheimers -- very difficult with little help   Current Meds  Medication Sig  . aspirin 81 MG tablet Take 81 mg by mouth daily. Daily with food   . cholecalciferol (VITAMIN D) 1000 UNITS tablet Take 1,000 Units by mouth daily.    Marland Kitchen gabapentin (NEURONTIN) 100 MG capsule TAKE ONE (1) CAPSULE BY MOUTH 2 TIMES DAILY  . levothyroxine (SYNTHROID, LEVOTHROID) 50 MCG tablet Take 1 tablet (50 mcg total) by mouth daily.  Marland Kitchen losartan-hydrochlorothiazide (HYZAAR) 100-25 MG tablet Take 1 tablet by mouth daily.  . mometasone (ELOCON) 0.1 % cream Apply 1 application topically daily. To affected areas behind ears  . polyethylene glycol powder (GLYCOLAX/MIRALAX) powder Take 17 g by mouth daily as needed. As directed for constipation  . vitamin C (ASCORBIC ACID) 500 MG tablet Take 500 mg by mouth daily.   Allergies  Allergen Reactions  . Amlodipine Other (See Comments)    Leg edema   . Atenolol Other (See Comments)    REACTION: bradycardia  . Erythromycin Base Other (See Comments)    REACTION: stomach upset  . Lisinopril Other (See Comments)    REACTION: cough  (20 mg)   No results found for this or any previous visit (from the past 2160 hour(s)). Objective  Body mass index is 27.53 kg/m. Wt Readings from Last 3 Encounters:  01/08/18 138 lb 9.6 oz (62.9 kg)   03/20/17 139 lb 4 oz (63.2 kg)  01/01/17 140 lb (63.5 kg)   Temp Readings from Last 3 Encounters:  01/08/18 97.8 F (36.6 C) (Oral)  10/15/17 97.9 F (36.6 C) (Oral)  03/20/17 97.9 F (36.6 C) (Oral)   BP Readings from Last 3 Encounters:  01/08/18 (!) 142/72  10/15/17 (!) 142/74  03/20/17 (!) 142/64   Pulse Readings from Last 3 Encounters:  01/08/18 68  10/15/17 61  03/20/17 62    Physical Exam  Constitutional: She is oriented to person, place, and time. Vital signs are normal. She appears well-developed and well-nourished. She is cooperative.  HENT:  Head: Normocephalic and atraumatic.  Mouth/Throat: Oropharynx is clear and moist and mucous membranes are normal.  Eyes: Pupils are equal, round, and reactive to light. Conjunctivae are normal.  Cardiovascular: Normal rate, regular rhythm and normal heart sounds.  Pulmonary/Chest: Effort normal and breath sounds normal.  Neurological: She is alert and oriented to person, place, and time. Gait normal.  Walking with cane today    Skin: Skin is warm, dry and intact.  Psychiatric: She has a normal mood and affect. Her speech is normal and behavior is normal. Judgment and thought content normal. Cognition and memory are normal.  Nursing note and vitals reviewed.   Assessment   1. Dizziness  2. Lumbar radiculopathy, DDD and spinal stenosis  3. H/o falls  4. htn  5. Hypothyroidism  6. HM Plan   1. Orthostatics neg today  Will do MRI brain if continues and carotid US h/o CAS mild noted in 2011  2. Monitor if legs worsen I.e weakness then will do MRI lumbar  3. Just completed PT and no falls since if continues to fall rec MRI brain  F/u in 2 months  4. Cont meds  5. Cont meds  Check labs today CMET, CBC, TSH, UA, vitamin D nonfasting  6.  Had flu shot10/27/19  prevnar utd  pna 23 due  Consider Tdap and shingrix in ftureu   Colonoscopy 09/02/10 diverticulosis fair prep  mammo 2016 neg  S/p hysterectomy pap 05/28/11  negative  DEXA none on file  Dermatology refer tbse and upper chest lesion isk likely vs other   Handicap form filled out today     Provider: Dr. Olivia Mackie McLean-Scocuzza-[facility]

## 2018-01-08 NOTE — Progress Notes (Signed)
Pre visit review using our clinic review tool, if applicable. No additional management support is needed unless otherwise documented below in the visit note. 

## 2018-01-08 NOTE — Patient Instructions (Signed)
Dizziness °Dizziness is a common problem. It is a feeling of unsteadiness or light-headedness. You may feel like you are about to faint. Dizziness can lead to injury if you stumble or fall. Anyone can become dizzy, but dizziness is more common in older adults. This condition can be caused by a number of things, including medicines, dehydration, or illness. °Follow these instructions at home: °Eating and drinking °· Drink enough fluid to keep your urine clear or pale yellow. This helps to keep you from becoming dehydrated. Try to drink more clear fluids, such as water. °· Do not drink alcohol. °· Limit your caffeine intake if told to do so by your health care provider. Check ingredients and nutrition facts to see if a food or beverage contains caffeine. °· Limit your salt (sodium) intake if told to do so by your health care provider. Check ingredients and nutrition facts to see if a food or beverage contains sodium. °Activity °· Avoid making quick movements. °? Rise slowly from chairs and steady yourself until you feel okay. °? In the morning, first sit up on the side of the bed. When you feel okay, stand slowly while you hold onto something until you know that your balance is fine. °· If you need to stand in one place for a long time, move your legs often. Tighten and relax the muscles in your legs while you are standing. °· Do not drive or use heavy machinery if you feel dizzy. °· Avoid bending down if you feel dizzy. Place items in your home so that they are easy for you to reach without leaning over. °Lifestyle °· Do not use any products that contain nicotine or tobacco, such as cigarettes and e-cigarettes. If you need help quitting, ask your health care provider. °· Try to reduce your stress level by using methods such as yoga or meditation. Talk with your health care provider if you need help to manage your stress. °General instructions °· Watch your dizziness for any changes. °· Take over-the-counter and  prescription medicines only as told by your health care provider. Talk with your health care provider if you think that your dizziness is caused by a medicine that you are taking. °· Tell a friend or a family member that you are feeling dizzy. If he or she notices any changes in your behavior, have this person call your health care provider. °· Keep all follow-up visits as told by your health care provider. This is important. °Contact a health care provider if: °· Your dizziness does not go away. °· Your dizziness or light-headedness gets worse. °· You feel nauseous. °· You have reduced hearing. °· You have new symptoms. °· You are unsteady on your feet or you feel like the room is spinning. °Get help right away if: °· You vomit or have diarrhea and are unable to eat or drink anything. °· You have problems talking, walking, swallowing, or using your arms, hands, or legs. °· You feel generally weak. °· You are not thinking clearly or you have trouble forming sentences. It may take a friend or family member to notice this. °· You have chest pain, abdominal pain, shortness of breath, or sweating. °· Your vision changes. °· You have any bleeding. °· You have a severe headache. °· You have neck pain or a stiff neck. °· You have a fever. °These symptoms may represent a serious problem that is an emergency. Do not wait to see if the symptoms will go away. Get medical help   right away. Call your local emergency services (911 in the [place] Do not drive yourself to the hospital. °Summary °· Dizziness is a feeling of unsteadiness or light-headedness. This condition can be caused by a number of things, including medicines, dehydration, or illness. °· Anyone can become dizzy, but dizziness is more common in older adults. °· Drink enough fluid to keep your urine clear or pale yellow. Do not drink alcohol. °· Avoid making quick movements if you feel dizzy. Monitor your dizziness for any changes. °This information is not intended to  replace advice given to you by your health care provider. Make sure you discuss any questions you have with your health care provider. °Document Released: 08/20/2000 Document Revised: 03/29/2016 Document Reviewed: 03/29/2016 °Elsevier Interactive Patient Education © 2018 [employer] ° °

## 2018-01-20 DIAGNOSIS — H353211 Exudative age-related macular degeneration, right eye, with active choroidal neovascularization: Secondary | ICD-10-CM | POA: Diagnosis not present

## 2018-01-22 ENCOUNTER — Ambulatory Visit (INDEPENDENT_AMBULATORY_CARE_PROVIDER_SITE_OTHER): Payer: Medicare Other | Admitting: Podiatry

## 2018-01-22 ENCOUNTER — Encounter: Payer: Self-pay | Admitting: Podiatry

## 2018-01-22 DIAGNOSIS — M79675 Pain in left toe(s): Secondary | ICD-10-CM

## 2018-01-22 DIAGNOSIS — B351 Tinea unguium: Secondary | ICD-10-CM | POA: Diagnosis not present

## 2018-01-22 DIAGNOSIS — M79674 Pain in right toe(s): Secondary | ICD-10-CM

## 2018-01-25 NOTE — Progress Notes (Signed)
   SUBJECTIVE Patient presents to office today complaining of elongated, thickened nails that cause pain while ambulating in shoes. She is unable to trim her own nails. Patient is here for further evaluation and treatment.  Past Medical History:  Diagnosis Date  . Anxiety 12/2002  . Cancer ([place])    breast left arm  . Cancer ([place])    uterine  . Diabetes mellitus 11/2004   Type 2  . Dyspnea    WITH EXERTION  . GERD (gastroesophageal reflux disease)   . Hyperlipidemia 11/2004  . Hypertension 12/2002  . Hypothyroidism   . Osteopenia 02/2002   02/2002 Dexa osteopenia// Dexa 02/26/2004 (Dr. Ubaldo Glassing) stable/IMR femur/spine  . Spinal stenosis   . 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 Ronnald Collum endocrinologist  . Vertigo     OBJECTIVE General Patient is awake, alert, and oriented x 3 and in no acute distress. Derm Skin is dry and supple bilateral. Negative open lesions or macerations. Remaining integument unremarkable. Nails are tender, long, thickened and dystrophic with subungual debris, consistent with onychomycosis, 1-5 bilateral. No signs of infection noted. Vasc  DP and PT pedal pulses palpable bilaterally. Temperature gradient within normal limits.  Neuro Epicritic and protective threshold sensation grossly intact bilaterally.  Musculoskeletal Exam No symptomatic pedal deformities noted bilateral. Muscular strength within normal limits.  ASSESSMENT 1. Onychodystrophic nails 1-5 bilateral with hyperkeratosis of nails.  2. Onychomycosis of nail due to dermatophyte bilateral 3. Pain in foot bilateral  PLAN OF CARE 1. Patient evaluated today.  2. Instructed to maintain good pedal hygiene and foot care.  3. Mechanical debridement of nails 1-5 bilaterally performed using a nail nipper. Filed with dremel without incident.  4. Return to clinic in 3 mos.    Edrick Kins, DPM [facility]  Dr. Edrick Kins, [address]                                        [place], [place] [address]                Office [phone]  Fax [phone]

## 2018-03-12 ENCOUNTER — Ambulatory Visit (INDEPENDENT_AMBULATORY_CARE_PROVIDER_SITE_OTHER): Payer: Medicare Other

## 2018-03-12 ENCOUNTER — Ambulatory Visit (INDEPENDENT_AMBULATORY_CARE_PROVIDER_SITE_OTHER): Payer: Medicare Other | Admitting: Internal Medicine

## 2018-03-12 ENCOUNTER — Encounter: Payer: Self-pay | Admitting: Internal Medicine

## 2018-03-12 VITALS — BP 136/64 | HR 87 | Temp 98.1°F | Ht 59.5 in | Wt 142.0 lb

## 2018-03-12 DIAGNOSIS — M25531 Pain in right wrist: Secondary | ICD-10-CM

## 2018-03-12 DIAGNOSIS — H353130 Nonexudative age-related macular degeneration, bilateral, stage unspecified: Secondary | ICD-10-CM

## 2018-03-12 DIAGNOSIS — M19041 Primary osteoarthritis, right hand: Secondary | ICD-10-CM | POA: Diagnosis not present

## 2018-03-12 DIAGNOSIS — M8580 Other specified disorders of bone density and structure, unspecified site: Secondary | ICD-10-CM | POA: Diagnosis not present

## 2018-03-12 DIAGNOSIS — R296 Repeated falls: Secondary | ICD-10-CM | POA: Diagnosis not present

## 2018-03-12 DIAGNOSIS — R350 Frequency of micturition: Secondary | ICD-10-CM

## 2018-03-12 DIAGNOSIS — C50912 Malignant neoplasm of unspecified site of left female breast: Secondary | ICD-10-CM

## 2018-03-12 DIAGNOSIS — M4800 Spinal stenosis, site unspecified: Secondary | ICD-10-CM

## 2018-03-12 DIAGNOSIS — E559 Vitamin D deficiency, unspecified: Secondary | ICD-10-CM

## 2018-03-12 DIAGNOSIS — N3281 Overactive bladder: Secondary | ICD-10-CM

## 2018-03-12 DIAGNOSIS — M79641 Pain in right hand: Secondary | ICD-10-CM

## 2018-03-12 DIAGNOSIS — E039 Hypothyroidism, unspecified: Secondary | ICD-10-CM

## 2018-03-12 DIAGNOSIS — M1811 Unilateral primary osteoarthritis of first carpometacarpal joint, right hand: Secondary | ICD-10-CM | POA: Diagnosis not present

## 2018-03-12 DIAGNOSIS — I10 Essential (primary) hypertension: Secondary | ICD-10-CM | POA: Diagnosis not present

## 2018-03-12 DIAGNOSIS — M1990 Unspecified osteoarthritis, unspecified site: Secondary | ICD-10-CM

## 2018-03-12 MED ORDER — LOSARTAN POTASSIUM-HCTZ 100-25 MG PO TABS: 1.0000 | ORAL_TABLET | Freq: Every day | ORAL | 3 refills | Status: DC

## 2018-03-12 MED ORDER — LEVOTHYROXINE SODIUM 50 MCG PO TABS: 50.0000 ug | ORAL_TABLET | Freq: Every day | ORAL | 3 refills | Status: DC

## 2018-03-12 MED ORDER — GABAPENTIN 100 MG PO CAPS: ORAL_CAPSULE | ORAL | 3 refills | Status: DC

## 2018-03-12 MED ORDER — DICLOFENAC SODIUM 1 % TD GEL: 2.0000 g | Freq: Four times a day (QID) | TRANSDERMAL | 11 refills | Status: DC

## 2018-03-12 MED ORDER — CHOLECALCIFEROL 1.25 MG (50000 UT) PO CAPS: 50000.0000 [IU] | ORAL_CAPSULE | ORAL | 1 refills | Status: DC

## 2018-03-12 NOTE — Progress Notes (Signed)
Pre visit review using our clinic review tool, if applicable. No additional management support is needed unless otherwise documented below in the visit note. 

## 2018-03-12 NOTE — Progress Notes (Signed)
Chief Complaint  Patient presents with  . Follow-up   F/u with granddaughter at the visit  1. HTN controlled on hyzaar 100-25 mg qd  2. Macular degeneration with reduced vision and eye doc per family states pt is not candidate for shots any longer and has cataract history she will see the eye MD 05/2018.  3. Arthritis in b/l hands right wrist/thumb are swollen and painful today mild to moderate for patient. She denies trauma to right wirst 4. H/o falls she does walk with a cane as it is easier in her house than a walker 5. Hypothyroidism on levo 50 needs refill  6. Vit D def she didn't pick up vitamin D weekly Rx  7. Overactive bladder complaints but she does have a portable toilet by her bed. Granddaughter does not want to try her on medications due to c/w side effects. Disc kegel exercises today     Review of Systems  Constitutional: Negative for weight loss.  HENT: Negative for hearing loss.   Eyes: Negative for blurred vision.  Respiratory: Negative for shortness of breath.   Cardiovascular: Negative for chest pain.  Gastrointestinal: Negative for abdominal pain.  Genitourinary: Positive for frequency.  Musculoskeletal: Positive for falls and joint pain.  Skin: Positive for itching. Negative for rash.       Itching to back and dry skin   Neurological: Negative for headaches.  Psychiatric/Behavioral: Negative for depression and memory loss.   Past Medical History:  Diagnosis Date  . Anxiety 12/2002  . Arthritis    hands  . Cancer ([place])    breast left arm  . Cancer ([place])    uterine  . Diabetes mellitus 11/2004   Type 2  . Dyspnea    WITH EXERTION  . GERD (gastroesophageal reflux disease)   . Hyperlipidemia 11/2004  . Hypertension 12/2002  . Hypothyroidism   . Osteopenia 02/2002   02/2002 Dexa osteopenia// Dexa 02/26/2004 (Dr. Ubaldo Glassing) stable/IMR femur/spine  . Spinal stenosis   . 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 Ronnald Collum  endocrinologist  . Vertigo    Past Surgical History:  Procedure Laterality Date  . ABDOMINAL HYSTERECTOMY  1990   endometrial adenoca  . BREAST SURGERY  1980   left breast lumpectomy //abscess of breast evac.  Marland Kitchen CATARACT EXTRACTION W/PHACO Left 12/11/2016   Procedure: CATARACT EXTRACTION PHACO AND INTRAOCULAR LENS PLACEMENT (IOC);  Surgeon: Eulogio Bear, MD;  Location: [facility] ORS;  Service: Ophthalmology;  Laterality: Left;  [place] 1:18.4 AP 36.6% CDE 16.13  . CATARACT EXTRACTION W/PHACO Right 01/01/2017   Procedure: CATARACT EXTRACTION PHACO AND INTRAOCULAR LENS PLACEMENT (IOC);  Surgeon: Eulogio Bear, MD;  Location: [facility] ORS;  Service: Ophthalmology;  Laterality: Right;  [place]: 01:45.5 AP% 16.6 CDE 18.21 Fluid pack lot # [id number]  . CHOLECYSTECTOMY  07/10/1987  . EYE SURGERY     Tear Duct surgery, b/l cataract 2019   . MASTECTOMY Left    Family History  Problem Relation Age of Onset  . Alzheimer's disease Mother   . Heart disease Mother 40       angina  . Hypertension Mother   . Crohn's disease Mother        ? crohns disease  . Cancer Father 69       colon cancer  . Cancer Sister 38       breast cancer   . COPD Sister        emphysema (smoker)  . Heart disease  Sister        angina  . Alcohol abuse Brother   . Diabetes Paternal Aunt   . Hypertension Sister   . Stroke Neg Hx    Social History   Socioeconomic History  . Marital status: Married    Spouse name: Not on file  . Number of children: Not on file  . Years of education: Not on file  . Highest education level: Not on file  Occupational History  . Not on file  Social Needs  . Financial resource strain: Not on file  . Food insecurity:    Worry: Not on file    Inability: Not on file  . Transportation needs:    Medical: Not on file    Non-medical: Not on file  Tobacco Use  . Smoking status: Never Smoker  . Smokeless tobacco: Never Used  Substance and Sexual Activity  . Alcohol use: No     Alcohol/week: 0.0 standard drinks  . Drug use: No  . Sexual activity: Not on file  Lifestyle  . Physical activity:    Days per week: Not on file    Minutes per session: Not on file  . Stress: Not on file  Relationships  . Social connections:    Talks on phone: Not on file    Gets together: Not on file    Attends religious service: Not on file    Active member of club or organization: Not on file    Attends meetings of clubs or organizations: Not on file    Relationship status: Not on file  . Intimate partner violence:    Fear of current or ex partner: Not on file    Emotionally abused: Not on file    Physically abused: Not on file    Forced sexual activity: Not on file  Other Topics Concern  . Not on file  Social History Narrative   Takes care of husband full time who has alzheimers -- very difficult with little help   Relies on family I.e granddaughter and grandaughters other grandmother to bring to appts    Current Meds  Medication Sig  . aspirin 81 MG tablet Take 81 mg by mouth daily. Daily with food   . Cholecalciferol 1.25 MG (50000 UT) capsule Take 1 capsule (50,000 Units total) by mouth once a week.  . gabapentin (NEURONTIN) 100 MG capsule TAKE ONE (1) CAPSULE BY MOUTH 2 TIMES DAILY  . levothyroxine (SYNTHROID, LEVOTHROID) 50 MCG tablet Take 1 tablet (50 mcg total) by mouth daily.  Marland Kitchen losartan-hydrochlorothiazide (HYZAAR) 100-25 MG tablet Take 1 tablet by mouth daily.  . mometasone (ELOCON) 0.1 % cream Apply 1 application topically daily. To affected areas behind ears  . polyethylene glycol powder (GLYCOLAX/MIRALAX) powder Take 17 g by mouth daily as needed. As directed for constipation  . vitamin C (ASCORBIC ACID) 500 MG tablet Take 500 mg by mouth daily.  . [DISCONTINUED] cholecalciferol (VITAMIN D) 1000 UNITS tablet Take 1,000 Units by mouth daily.    . [DISCONTINUED] Cholecalciferol 50000 units capsule Take 1 capsule (50,000 Units total) by mouth once a week.  .  [DISCONTINUED] gabapentin (NEURONTIN) 100 MG capsule TAKE ONE (1) CAPSULE BY MOUTH 2 TIMES DAILY  . [DISCONTINUED] levothyroxine (SYNTHROID, LEVOTHROID) 50 MCG tablet Take 1 tablet (50 mcg total) by mouth daily.  . [DISCONTINUED] losartan-hydrochlorothiazide (HYZAAR) 100-25 MG tablet Take 1 tablet by mouth daily.   Allergies  Allergen Reactions  . Amlodipine Other (See Comments)    Leg edema   .  Atenolol Other (See Comments)    REACTION: bradycardia  . Erythromycin Base Other (See Comments)    REACTION: stomach upset  . Lisinopril Other (See Comments)    REACTION: cough  (20 mg)   Recent Results (from the past 2160 hour(s))  Comprehensive metabolic panel     Status: Abnormal   Collection Time: 01/08/18 11:07 AM  Result Value Ref Range   Sodium 141 135 - 145 mEq/L   Potassium 4.1 3.5 - 5.1 mEq/L   Chloride 104 96 - 112 mEq/L   CO2 28 19 - 32 mEq/L   Glucose, Bld 97 70 - 99 mg/dL   BUN 22 6 - 23 mg/dL   Creatinine, Ser 0.94 0.40 - 1.20 mg/dL   Total Bilirubin 0.6 0.2 - 1.2 mg/dL   Alkaline Phosphatase 85 39 - 117 U/L   AST 17 0 - 37 U/L   ALT 12 0 - 35 U/L   Total Protein 7.2 6.0 - 8.3 g/dL   Albumin 4.2 3.5 - 5.2 g/dL   Calcium 9.4 8.4 - 10.5 mg/dL   GFR 59.58 (L) >60.00 mL/min  CBC with Differential/Platelet     Status: None   Collection Time: 01/08/18 11:07 AM  Result Value Ref Range   WBC 10.0 4.0 - 10.5 K/uL   RBC 4.25 3.87 - 5.11 Mil/uL   Hemoglobin 13.1 12.0 - 15.0 g/dL   HCT 39.6 36.0 - 46.0 %   MCV 93.2 78.0 - 100.0 fl   MCHC 33.0 30.0 - 36.0 g/dL   RDW 15.3 11.5 - 15.5 %   Platelets 312.0 150.0 - 400.0 K/uL   Neutrophils Relative % 68.8 43.0 - 77.0 %   Lymphocytes Relative 19.8 12.0 - 46.0 %   Monocytes Relative 8.0 3.0 - 12.0 %   Eosinophils Relative 2.4 0.0 - 5.0 %   Basophils Relative 1.0 0.0 - 3.0 %   Neutro Abs 6.9 1.4 - 7.7 K/uL   Lymphs Abs 2.0 0.7 - 4.0 K/uL   Monocytes Absolute 0.8 0.1 - 1.0 K/uL   Eosinophils Absolute 0.2 0.0 - 0.7 K/uL    Basophils Absolute 0.1 0.0 - 0.1 K/uL  TSH     Status: None   Collection Time: 01/08/18 11:07 AM  Result Value Ref Range   TSH 1.33 0.35 - 4.50 uIU/mL  Vitamin D (25 hydroxy)     Status: Abnormal   Collection Time: 01/08/18 11:07 AM  Result Value Ref Range   VITD 18.92 (L) 30.00 - 100.00 ng/mL  Urinalysis, Routine w reflex microscopic     Status: Abnormal   Collection Time: 03/12/18 11:16 AM  Result Value Ref Range   Color, Urine YELLOW YELLOW   APPearance CLEAR CLEAR   Specific Gravity, Urine 1.018 1.001 - 1.03   pH 5.5 5.0 - 8.0   Glucose, UA NEGATIVE NEGATIVE   Bilirubin Urine NEGATIVE NEGATIVE   Ketones, ur NEGATIVE NEGATIVE   Hgb urine dipstick NEGATIVE NEGATIVE   Protein, ur NEGATIVE NEGATIVE   Nitrite NEGATIVE NEGATIVE   Leukocytes, UA 2+ (A) NEGATIVE   WBC, UA 10-20 (A) 0 - 5 /HPF   RBC / HPF 0-2 0 - 2 /HPF   Squamous Epithelial / LPF 6-10 (A) < OR = 5 /HPF   Bacteria, UA NONE SEEN NONE SEEN /HPF   Hyaline Cast NONE SEEN NONE SEEN /LPF  Urine Culture     Status: None   Collection Time: 03/12/18 11:16 AM  Result Value Ref Range   MICRO NUMBER: [id number]  SPECIMEN QUALITY: Adequate    Sample Source URINE    STATUS: FINAL    Result:      Multiple organisms present, each less than 10,000 CFU/mL. These organisms, commonly found on external and internal genitalia, are considered to be colonizers. No further testing performed.   Objective  Body mass index is 28.2 kg/m. Wt Readings from Last 3 Encounters:  03/12/18 142 lb (64.4 kg)  01/08/18 138 lb 9.6 oz (62.9 kg)  03/20/17 139 lb 4 oz (63.2 kg)   Temp Readings from Last 3 Encounters:  03/12/18 98.1 F (36.7 C) (Oral)  01/08/18 97.8 F (36.6 C) (Oral)  10/15/17 97.9 F (36.6 C) (Oral)   BP Readings from Last 3 Encounters:  03/12/18 136/64  01/08/18 (!) 142/72  10/15/17 (!) 142/74   Pulse Readings from Last 3 Encounters:  03/12/18 87  01/08/18 68  10/15/17 61    Physical Exam Vitals signs and nursing  note reviewed.  Constitutional:      Appearance: Normal appearance. She is well-developed.  HENT:     Head: Normocephalic and atraumatic.     Mouth/Throat:     Mouth: Mucous membranes are moist.     Pharynx: Oropharynx is clear.  Eyes:     Conjunctiva/sclera: Conjunctivae normal.     Pupils: Pupils are equal, round, and reactive to light.  Cardiovascular:     Rate and Rhythm: Normal rate and regular rhythm.     Heart sounds: Normal heart sounds.  Pulmonary:     Effort: Pulmonary effort is normal.     Breath sounds: Normal breath sounds.  Musculoskeletal:     Right wrist: She exhibits tenderness and swelling.       Arms:  Skin:    General: Skin is warm and dry.     Comments: Sks to back and xerosis to skin   Neurological:     General: No focal deficit present.     Mental Status: She is alert and oriented to person, place, and time.     Comments: BL walking with cane today    Psychiatric:        Attention and Perception: Attention and perception normal.        Mood and Affect: Mood and affect normal.        Speech: Speech normal.        Behavior: Behavior normal. Behavior is cooperative.        Thought Content: Thought content normal.        Cognition and Memory: Cognition and memory normal.        Judgment: Judgment normal.     Assessment   1. HTN  2. Hypothyroidism  3. Reduced  Vision 2/2 cataracts s/p surgery and mac. Degeneration no longer candidate for injections 4. OA hands likely and right wrist 5. Falls likely mechanical and due to abnormal gait with h/o spinal stenosis and #2  6. Vit D def  7. Overactive bladder r/o infection  8. HM Plan   1. Refilled meds  Reviewed labs 01/08/18  2.  Refilled meds cont same dose  3. F/u eye MD 05/2018  4. Xray right wrist and hand  Prn Tylenol  Trial of voltaren gel  5. Monitor for falls  6. D3 50K weekly x 6 months then otc D3 5K IU daily month 7  7. Disc kegels declines meds for now  UA and culture r/o infection    8.  Had flu shot10/27/19  prevnar utd  pna 25 due  consider at f/u  Consider Tdap and shingrix in future   Colonoscopy 09/02/10 diverticulosis fair prep, out of age window  mammo 06/01/2014 neg, h/o left breast cancer consider right mammo in future  S/p hysterectomy pap 05/28/11 negative  DEXA none on file -consider in future  Dermatology refer tbse and upper chest lesion isk likely vs other  Podiatry appt 04/2018 per pt   Provider: Dr. Olivia Mackie McLean-Scocuzza-[facility]

## 2018-03-12 NOTE — Patient Instructions (Addendum)
Vitamin D3 weekly x 6 months then daily 5000 IU daily starting month 7   Cetaphil or Cerave Cream with back applicator  Sarna lotion for itching  Over the counter   Voltaren gel as needed and Tylenol for arthritis pain 500 mg up to 6x per day    Warm prune juice, Miralax, fiber like benefiber/metamucil or fiber gummies  Colace/docusate 100 mg daily to 2x per day as needed with Miralax   Had flu shot10/27/19  prevnar utd  pna 25 due  Consider Tdap and shingrix in ftureu   Colonoscopy 09/02/10 diverticulosis fair prep  mammo 2016 neg  S/p hysterectomy pap 05/28/11 negative  DEXA none on file  Dermatology refer tbse and upper chest lesion isk likely vs other   Handicap form filled out today   Vitamin D Deficiency Vitamin D deficiency is when your body does not have enough vitamin D. Vitamin D is important to your body for many reasons:  It helps the body to absorb two important minerals, called calcium and phosphorus.  It plays a role in bone health.  It may help to prevent some diseases, such as diabetes and multiple sclerosis.  It plays a role in muscle function, including heart function. You can get vitamin D by:  Eating foods that naturally contain vitamin D.  Eating or drinking milk or other dairy products that have vitamin D added to them.  Taking a vitamin D supplement or a multivitamin supplement that contains vitamin D.  Being in the sun. Your body naturally makes vitamin D when your skin is exposed to sunlight. Your body changes the sunlight into a form of the vitamin that the body can use. If vitamin D deficiency is severe, it can cause a condition in which your bones become soft. In adults, this condition is called osteomalacia. In children, this condition is called rickets. What are the causes? Vitamin D deficiency may be caused by:  Not eating enough foods that contain vitamin D.  Not getting enough sun exposure.  Having certain digestive system  diseases that make it difficult for your body to absorb vitamin D. These diseases include Crohn disease, chronic pancreatitis, and cystic fibrosis.  Having a surgery in which a part of the stomach or a part of the small intestine is removed.  Being obese.  Having chronic kidney disease or liver disease. What increases the risk? This condition is more likely to develop in:  Older people.  People who do not spend much time outdoors.  People who live in a long-term care facility.  People who have had broken bones.  People with weak or thin bones (osteoporosis).  People who have a disease or condition that changes how the body absorbs vitamin D.  People who have dark skin.  People who take certain medicines, such as steroid medicines or certain seizure medicines.  People who are overweight or obese. What are the signs or symptoms? In mild cases of vitamin D deficiency, there may not be any symptoms. If the condition is severe, symptoms may include:  Bone pain.  Muscle pain.  Falling often.  Broken bones caused by a minor injury. How is this diagnosed? This condition is usually diagnosed with a blood test. How is this treated? Treatment for this condition may depend on what caused the condition. Treatment options include:  Taking vitamin D supplements.  Taking a calcium supplement. Your health care provider will suggest what dose is best for you. Follow these instructions at home:  Take  medicines and supplements only as told by your health care provider.  Eat foods that contain vitamin D. Choices include: ? Fortified dairy products, cereals, or juices. Fortified means that vitamin D has been added to the food. Check the label on the package to be sure. ? Fatty fish, such as salmon or trout. ? Eggs. ? Oysters.  Do not use a tanning bed.  Maintain a healthy weight. Lose weight, if needed.  Keep all follow-up visits as told by your health care provider. This is  important. Contact a health care provider if:  Your symptoms do not go away.  You feel like throwing up (nausea) or you throw up (vomit).  You have fewer bowel movements than usual or it is difficult for you to have a bowel movement (constipation). This information is not intended to replace advice given to you by your health care provider. Make sure you discuss any questions you have with your health care provider. Document Released: 05/19/2011 Document Revised: 08/08/2015 Document Reviewed: 07/12/2014 Elsevier Interactive Patient Education  2019 [employer].    Overactive Bladder, Adult  Overactive bladder refers to a condition in which a person has a sudden need to pass urine. The person may leak urine if he or she cannot get to the bathroom fast enough (urinary incontinence). A person with this condition may also wake up several times in the night to go to the bathroom. Overactive bladder is associated with poor nerve signals between your bladder and your brain. Your bladder may get the signal to empty before it is full. You may also have very sensitive muscles that make your bladder squeeze too soon. These symptoms might interfere with daily work or social activities. What are the causes? This condition may be associated with or caused by:  Urinary tract infection.  Infection of nearby tissues, such as the prostate.  Prostate enlargement.  Surgery on the uterus or urethra.  Bladder stones, inflammation, or tumors.  Drinking too much caffeine or alcohol.  Certain medicines, especially medicines that get rid of extra fluid in the body (diuretics).  Muscle or nerve weakness, especially from: ? A spinal cord injury. ? Stroke. ? Multiple sclerosis. ? Parkinson's disease.  Diabetes.  Constipation. What increases the risk? You may be at greater risk for overactive bladder if you:  Are an older adult.  Smoke.  Are going through menopause.  Have prostate  problems.  Have a neurological disease, such as stroke, dementia, Parkinson's disease, or multiple sclerosis (MS).  Eat or drink things that irritate the bladder. These include alcohol, spicy food, and caffeine.  Are overweight or obese. What are the signs or symptoms? Symptoms of this condition include:  Sudden, strong urge to urinate.  Leaking urine.  Urinating 8 or more times a day.  Waking up to urinate 2 or more times a night. How is this diagnosed? Your health care provider may suspect overactive bladder based on your symptoms. He or she will diagnose this condition by:  A physical exam and medical history.  Blood or urine tests. You might need bladder or urine tests to help determine what is causing your overactive bladder. You might also need to see a health care provider who specializes in urinary tract problems (urologist). How is this treated? Treatment for overactive bladder depends on the cause of your condition and whether it is mild or severe. You can also make lifestyle changes at home. Options include:  Bladder training. This may include: ? Learning to control the  urge to urinate by following a schedule that directs you to urinate at regular intervals (timed voiding). ? Doing Kegel exercises to strengthen your pelvic floor muscles, which support your bladder. Toning these muscles can help you control urination, even if your bladder muscles are overactive.  Special devices. This may include: ? Biofeedback, which uses sensors to help you become aware of your body's signals. ? Electrical stimulation, which uses electrodes placed inside the body (implanted) or outside the body. These electrodes send gentle pulses of electricity to strengthen the nerves or muscles that control the bladder. ? Women may use a plastic device that fits into the vagina and supports the bladder (pessary).  Medicines. ? Antibiotics to treat bladder infection. ? Antispasmodics to stop the  bladder from releasing urine at the wrong time. ? Tricyclic antidepressants to relax bladder muscles. ? Injections of botulinum toxin type A directly into the bladder tissue to relax bladder muscles.  Lifestyle changes. This may include: ? Weight loss. Talk to your health care provider about weight loss methods that would work best for you. ? Diet changes. This may include reducing how much alcohol and caffeine you consume, or drinking fluids at different times of the day. ? Not smoking. Do not use any products that contain nicotine or tobacco, such as cigarettes and e-cigarettes. If you need help quitting, ask your health care provider.  Surgery. ? A device may be implanted to help manage the nerve signals that control urination. ? An electrode may be implanted to stimulate electrical signals in the bladder. ? A procedure may be done to change the shape of the bladder. This is done only in very severe cases. Follow these instructions at home: Lifestyle  Make any diet or lifestyle changes that are recommended by your health care provider. These may include: ? Drinking less fluid or drinking fluids at different times of the day. ? Cutting down on caffeine or alcohol. ? Doing Kegel exercises. ? Losing weight if needed. ? Eating a healthy and balanced diet to prevent constipation. This may include:  Eating foods that are high in fiber, such as fresh fruits and vegetables, whole grains, and beans.  Limiting foods that are high in fat and processed sugars, such as fried and sweet foods. General instructions  Take over-the-counter and prescription medicines only as told by your health care provider.  If you were prescribed an antibiotic medicine, take it as told by your health care provider. Do not stop taking the antibiotic even if you start to feel better.  Use any implants or pessary as told by your health care provider.  If needed, wear pads to absorb urine leakage.  Keep a journal  or log to track how much and when you drink and when you feel the need to urinate. This will help your health care provider monitor your condition.  Keep all follow-up visits as told by your health care provider. This is important. Contact a health care provider if:  You have a fever.  Your symptoms do not get better with treatment.  Your pain and discomfort get worse.  You have more frequent urges to urinate. Get help right away if:  You are not able to control your bladder. Summary  Overactive bladder refers to a condition in which a person has a sudden need to pass urine.  Several conditions may lead to an overactive bladder.  Treatment for overactive bladder depends on the cause and severity of your condition.  Follow your health care  provider's instructions about lifestyle changes, doing Kegel exercises, keeping a journal, and taking medicines. This information is not intended to replace advice given to you by your health care provider. Make sure you discuss any questions you have with your health care provider. Document Released: 12/21/2008 Document Revised: 03/12/2017 Document Reviewed: 03/12/2017 Elsevier Interactive Patient Education  2019 [employer].

## 2018-03-13 LAB — URINALYSIS, ROUTINE W REFLEX MICROSCOPIC
Bacteria, UA: NONE SEEN /HPF
Bilirubin Urine: NEGATIVE
Glucose, UA: NEGATIVE
HYALINE CAST: NONE SEEN /LPF
Hgb urine dipstick: NEGATIVE
Ketones, ur: NEGATIVE
Nitrite: NEGATIVE
Protein, ur: NEGATIVE
Specific Gravity, Urine: 1.018 (ref 1.001–1.03)
pH: 5.5 (ref 5.0–8.0)

## 2018-03-13 LAB — URINE CULTURE
MICRO NUMBER:: 8529
SPECIMEN QUALITY:: ADEQUATE

## 2018-03-15 ENCOUNTER — Encounter: Payer: Self-pay | Admitting: Internal Medicine

## 2018-03-15 ENCOUNTER — Telehealth: Payer: Self-pay | Admitting: Internal Medicine

## 2018-03-15 NOTE — Telephone Encounter (Signed)
Call pt with h/o of breast cancer does she want me to order a mammogram of her right breast  And with thin bones does she want me to order a bone density ????  [place]

## 2018-03-19 NOTE — Telephone Encounter (Signed)
Patient stated she wants to wait due to having too many thing at the moment.

## 2018-03-30 DIAGNOSIS — L298 Other pruritus: Secondary | ICD-10-CM | POA: Diagnosis not present

## 2018-03-30 DIAGNOSIS — L820 Inflamed seborrheic keratosis: Secondary | ICD-10-CM | POA: Diagnosis not present

## 2018-03-30 DIAGNOSIS — L538 Other specified erythematous conditions: Secondary | ICD-10-CM | POA: Diagnosis not present

## 2018-05-28 ENCOUNTER — Ambulatory Visit: Payer: Medicare Other | Admitting: Podiatry

## 2018-09-14 ENCOUNTER — Other Ambulatory Visit: Payer: Self-pay

## 2018-09-14 ENCOUNTER — Encounter: Payer: Self-pay | Admitting: Internal Medicine

## 2018-09-14 ENCOUNTER — Ambulatory Visit (INDEPENDENT_AMBULATORY_CARE_PROVIDER_SITE_OTHER): Payer: Medicare Other | Admitting: Internal Medicine

## 2018-09-14 ENCOUNTER — Telehealth: Payer: Self-pay | Admitting: Internal Medicine

## 2018-09-14 DIAGNOSIS — E039 Hypothyroidism, unspecified: Secondary | ICD-10-CM

## 2018-09-14 DIAGNOSIS — R7303 Prediabetes: Secondary | ICD-10-CM | POA: Diagnosis not present

## 2018-09-14 DIAGNOSIS — I10 Essential (primary) hypertension: Secondary | ICD-10-CM | POA: Diagnosis not present

## 2018-09-14 DIAGNOSIS — E559 Vitamin D deficiency, unspecified: Secondary | ICD-10-CM

## 2018-09-14 DIAGNOSIS — E7800 Pure hypercholesterolemia, unspecified: Secondary | ICD-10-CM

## 2018-09-14 NOTE — Telephone Encounter (Signed)
I called pt and left a vm for pt to schedule Return in about 6 months

## 2018-09-14 NOTE — Progress Notes (Signed)
Telephone Note  I connected with Neta Mends  on 09/14/18 at 11:16 AM EDT by telephone  and verified that I am speaking with the correct person using two identifiers.  Location patient: home Location provider:work Persons participating in the virtual visit: patient, provider  I discussed the limitations of evaluation and management by telemedicine and the availability of in person appointments. The patient expressed understanding and agreed to proceed.   HPI:  HTN on hyzaar 100-25 mg qd BP controlled   Hypothyroidism on levo 50 mcg qd contolled   CAS mild noted in 2011 takes BP meds and aspirin 81 mg qd   No falls since 03/2018   ROS: See pertinent positives and negatives per HPI.  Past Medical History:  Diagnosis Date  . Anxiety 12/2002  . Arthritis    hands  . Cancer ([place])    breast left arm  . Cancer ([place])    uterine  . Diabetes mellitus 11/2004   Type 2  . Dyspnea    WITH EXERTION  . GERD (gastroesophageal reflux disease)   . Hyperlipidemia 11/2004  . Hypertension 12/2002  . Hypothyroidism   . Osteopenia 02/2002   02/2002 Dexa osteopenia// Dexa 02/26/2004 (Dr. Ubaldo Glassing) stable/IMR femur/spine  . Spinal stenosis   . 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 Ronnald Collum endocrinologist  . Vertigo     Past Surgical History:  Procedure Laterality Date  . ABDOMINAL HYSTERECTOMY  1990   endometrial adenoca  . BREAST SURGERY  1980   left breast lumpectomy //abscess of breast evac.  Marland Kitchen CATARACT EXTRACTION W/PHACO Left 12/11/2016   Procedure: CATARACT EXTRACTION PHACO AND INTRAOCULAR LENS PLACEMENT (IOC);  Surgeon: Eulogio Bear, MD;  Location: [facility] ORS;  Service: Ophthalmology;  Laterality: Left;  [place] 1:18.4 AP 36.6% CDE 16.13  . CATARACT EXTRACTION W/PHACO Right 01/01/2017   Procedure: CATARACT EXTRACTION PHACO AND INTRAOCULAR LENS PLACEMENT (IOC);  Surgeon: Eulogio Bear, MD;  Location: [facility] ORS;  Service: Ophthalmology;   Laterality: Right;  [place]: 01:45.5 AP% 16.6 CDE 18.21 Fluid pack lot # [id number]  . CHOLECYSTECTOMY  07/10/1987  . EYE SURGERY     Tear Duct surgery, b/l cataract 2019   . MASTECTOMY Left     Family History  Problem Relation Age of Onset  . Alzheimer's disease Mother   . Heart disease Mother 60       angina  . Hypertension Mother   . Crohn's disease Mother        ? crohns disease  . Cancer Father 26       colon cancer  . Cancer Sister 27       breast cancer   . COPD Sister        emphysema (smoker)  . Heart disease Sister        angina  . Alcohol abuse Brother   . Diabetes Paternal Aunt   . Hypertension Sister   . Stroke Neg Hx     SOCIAL HX: lives alone   Current Outpatient Medications:  .  aspirin 81 MG tablet, Take 81 mg by mouth daily. Daily with food , Disp: , Rfl:  .  Cholecalciferol 1.25 MG (50000 UT) capsule, Take 1 capsule (50,000 Units total) by mouth once a week., Disp: 13 capsule, Rfl: 1 .  diclofenac sodium (VOLTAREN) 1 % GEL, Apply 2 g topically 4 (four) times daily. Upper body and 4 grams lower body, Disp: 100 g, Rfl: 11 .  gabapentin (NEURONTIN) 100 MG  capsule, TAKE ONE (1) CAPSULE BY MOUTH 2 TIMES DAILY, Disp: 180 capsule, Rfl: 3 .  levothyroxine (SYNTHROID, LEVOTHROID) 50 MCG tablet, Take 1 tablet (50 mcg total) by mouth daily., Disp: 90 tablet, Rfl: 3 .  losartan-hydrochlorothiazide (HYZAAR) 100-25 MG tablet, Take 1 tablet by mouth daily., Disp: 90 tablet, Rfl: 3 .  mometasone (ELOCON) 0.1 % cream, Apply 1 application topically daily. To affected areas behind ears, Disp: 30 g, Rfl: 1 .  polyethylene glycol powder (GLYCOLAX/MIRALAX) powder, Take 17 g by mouth daily as needed. As directed for constipation, Disp: 255 g, Rfl: 3 .  vitamin C (ASCORBIC ACID) 500 MG tablet, Take 500 mg by mouth daily., Disp: , Rfl:   EXAM:  VITALS per patient if applicable:  PSYCH/NEURO: pleasant and cooperative, no obvious depression or anxiety, speech and thought processing  grossly intact  ASSESSMENT AND PLAN:  Discussed the following assessment and plan:  Essential hypertension - Plan: cont meds sch fasting labs   Hypothyroidism, unspecified type - Plan: TSH, cont meds   Vitamin D deficiency - Plan: Vitamin D (25 hydroxy)  Prediabetes - Plan: Hemoglobin A1c  HLD/CAS mild noted in 2011-on aspirin BP controlled  Consider statin in future   HM Had flu shot10/27/19  prevnar utd  pna 23 due consider at f/u  Consider Tdap and shingrix in future   Colonoscopy 09/02/10 diverticulosis fair prep, out of age window  mammo 06/01/2014 neg, h/o left breast cancer consider right mammo in future declines for now  S/p hysterectomy pap 05/28/11 negative  DEXA declines for now  Dermatology refer tbse and upper chest lesion isk likely vs other  Podiatry appt 04/2018 per pt   AE appt s/p cataract surgery and MD w/o shots any more 11/2018  Podiatry appt upcoming 09/24/18   I discussed the assessment and treatment plan with the patient. The patient was provided an opportunity to ask questions and all were answered. The patient agreed with the plan and demonstrated an understanding of the instructions.   The patient was advised to call back or seek an in-person evaluation if the symptoms worsen or if the condition fails to improve as anticipated.  Time spent 20 minutes  Delorise Jackson, MD

## 2018-09-14 NOTE — Patient Instructions (Addendum)
Clinics for no insurance   [facility]  [facility]  [facility]  [phone]

## 2018-09-24 ENCOUNTER — Encounter: Payer: Self-pay | Admitting: Podiatry

## 2018-09-24 ENCOUNTER — Ambulatory Visit (INDEPENDENT_AMBULATORY_CARE_PROVIDER_SITE_OTHER): Payer: Medicare Other | Admitting: Podiatry

## 2018-09-24 ENCOUNTER — Other Ambulatory Visit: Payer: Self-pay

## 2018-09-24 ENCOUNTER — Telehealth: Payer: Self-pay | Admitting: Internal Medicine

## 2018-09-24 VITALS — Temp 99.1°F

## 2018-09-24 DIAGNOSIS — B351 Tinea unguium: Secondary | ICD-10-CM | POA: Diagnosis not present

## 2018-09-24 DIAGNOSIS — M79675 Pain in left toe(s): Secondary | ICD-10-CM

## 2018-09-24 DIAGNOSIS — M79674 Pain in right toe(s): Secondary | ICD-10-CM

## 2018-09-24 NOTE — Telephone Encounter (Signed)
I called pt and left a vm to Sch fasting labs asap F/u in 6 months

## 2018-09-24 NOTE — Telephone Encounter (Signed)
Both appointments have been scheduled.

## 2018-09-27 NOTE — Progress Notes (Signed)
   SUBJECTIVE Patient with a history of diabetes mellitus presents to office today complaining of elongated, thickened nails that cause pain while ambulating in shoes. She is unable to trim her own nails. Patient is here for further evaluation and treatment.   Past Medical History:  Diagnosis Date  . Anxiety 12/2002  . Arthritis    hands  . Cancer ([place])    breast left arm  . Cancer ([place])    uterine  . Diabetes mellitus 11/2004   Type 2  . Dyspnea    WITH EXERTION  . GERD (gastroesophageal reflux disease)   . Hyperlipidemia 11/2004  . Hypertension 12/2002  . Hypothyroidism   . Osteopenia 02/2002   02/2002 Dexa osteopenia// Dexa 02/26/2004 (Dr. Ubaldo Glassing) stable/IMR femur/spine  . Spinal stenosis   . 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 Ronnald Collum endocrinologist  . Vertigo     OBJECTIVE General Patient is awake, alert, and oriented x 3 and in no acute distress. Derm Skin is dry and supple bilateral. Negative open lesions or macerations. Remaining integument unremarkable. Nails are tender, long, thickened and dystrophic with subungual debris, consistent with onychomycosis, 1-5 bilateral. No signs of infection noted. Vasc  DP and PT pedal pulses palpable bilaterally. Temperature gradient within normal limits.  Neuro Epicritic and protective threshold sensation diminished bilaterally.  Musculoskeletal Exam No symptomatic pedal deformities noted bilateral. Muscular strength within normal limits.  ASSESSMENT 1. Diabetes Mellitus w/ peripheral neuropathy 2. Onychomycosis of nail due to dermatophyte bilateral 3. Pain in foot bilateral  PLAN OF CARE 1. Patient evaluated today. 2. Instructed to maintain good pedal hygiene and foot care. Stressed importance of controlling blood sugar.  3. Mechanical debridement of nails 1-5 bilaterally performed using a nail nipper. Filed with dremel without incident.  4. Return to clinic in 3 mos.     Edrick Kins,  DPM [facility]  Dr. Edrick Kins, [address]                                        [place], [place] [address]                Office [phone]  Fax [phone]

## 2018-10-07 ENCOUNTER — Telehealth: Payer: Self-pay | Admitting: Internal Medicine

## 2018-10-07 ENCOUNTER — Other Ambulatory Visit: Payer: Self-pay

## 2018-10-07 ENCOUNTER — Other Ambulatory Visit (INDEPENDENT_AMBULATORY_CARE_PROVIDER_SITE_OTHER): Payer: Medicare Other

## 2018-10-07 DIAGNOSIS — E039 Hypothyroidism, unspecified: Secondary | ICD-10-CM | POA: Diagnosis not present

## 2018-10-07 DIAGNOSIS — R7303 Prediabetes: Secondary | ICD-10-CM | POA: Diagnosis not present

## 2018-10-07 DIAGNOSIS — E559 Vitamin D deficiency, unspecified: Secondary | ICD-10-CM | POA: Diagnosis not present

## 2018-10-07 DIAGNOSIS — I10 Essential (primary) hypertension: Secondary | ICD-10-CM | POA: Diagnosis not present

## 2018-10-07 LAB — CBC WITH DIFFERENTIAL/PLATELET
Basophils Absolute: 0.1 10*3/uL (ref 0.0–0.1)
Basophils Relative: 0.9 % (ref 0.0–3.0)
Eosinophils Absolute: 0.2 10*3/uL (ref 0.0–0.7)
Eosinophils Relative: 2.7 % (ref 0.0–5.0)
HCT: 36.3 % (ref 36.0–46.0)
Hemoglobin: 12 g/dL (ref 12.0–15.0)
Lymphocytes Relative: 17.9 % (ref 12.0–46.0)
Lymphs Abs: 1.5 10*3/uL (ref 0.7–4.0)
MCHC: 33 g/dL (ref 30.0–36.0)
MCV: 93.1 fl (ref 78.0–100.0)
Monocytes Absolute: 0.7 10*3/uL (ref 0.1–1.0)
Monocytes Relative: 8.7 % (ref 3.0–12.0)
Neutro Abs: 5.9 10*3/uL (ref 1.4–7.7)
Neutrophils Relative %: 69.8 % (ref 43.0–77.0)
Platelets: 297 10*3/uL (ref 150.0–400.0)
RBC: 3.9 Mil/uL (ref 3.87–5.11)
RDW: 14.7 % (ref 11.5–15.5)
WBC: 8.4 10*3/uL (ref 4.0–10.5)

## 2018-10-07 LAB — LIPID PANEL
Cholesterol: 162 mg/dL (ref 0–200)
HDL: 56.3 mg/dL (ref >39.00)
LDL Cholesterol: 91 mg/dL (ref 0–99)
NonHDL: 105.37
Total CHOL/HDL Ratio: 3
Triglycerides: 73 mg/dL (ref 0.0–149.0)
VLDL: 14.6 mg/dL (ref 0.0–40.0)

## 2018-10-07 LAB — COMPREHENSIVE METABOLIC PANEL
ALT: 9 U/L (ref 0–35)
AST: 14 U/L (ref 0–37)
Albumin: 4 g/dL (ref 3.5–5.2)
Alkaline Phosphatase: 71 U/L (ref 39–117)
BUN: 20 mg/dL (ref 6–23)
CO2: 27 mEq/L (ref 19–32)
Calcium: 9.2 mg/dL (ref 8.4–10.5)
Chloride: 106 mEq/L (ref 96–112)
Creatinine, Ser: 0.88 mg/dL (ref 0.40–1.20)
GFR: 60.39 mL/min (ref >60.00)
Glucose, Bld: 98 mg/dL (ref 70–99)
Potassium: 4 mEq/L (ref 3.5–5.1)
Sodium: 141 mEq/L (ref 135–145)
Total Bilirubin: 0.8 mg/dL (ref 0.2–1.2)
Total Protein: 6.5 g/dL (ref 6.0–8.3)

## 2018-10-07 LAB — TSH: TSH: 1.16 u[IU]/mL (ref 0.35–4.50)

## 2018-10-07 LAB — HEMOGLOBIN A1C: Hgb A1c MFr Bld: 5.8 % (ref 4.6–6.5)

## 2018-10-07 LAB — VITAMIN D 25 HYDROXY (VIT D DEFICIENCY, FRACTURES): VITD: 43.25 ng/mL (ref 30.00–100.00)

## 2018-10-07 NOTE — Telephone Encounter (Signed)
Lab results   Cholesterol normal  Liver kidneys normal  Vitamin D improved continue vitamin D3 over the counter 4000 iu daily  A1C indicates prediabetes  Thyroid lab normal  Blood cts normal

## 2018-10-21 NOTE — Telephone Encounter (Signed)
Pt. Called to discuss labs and Vit D3. Verbalizes understanding.

## 2018-10-24 IMAGING — DX DG PELVIS 1-2V
1 series · 1 of 1 positions shown · non-contrast
Comparison: 02/04/2014

CLINICAL DATA: Buttock pain after falling backwards on 10/02/2017,
some bruising of LEFT buttock, question pelvic fracture, fall in
home, initial encounter

EXAM:
PELVIS - 1-2 VIEW

[pelvis ap]
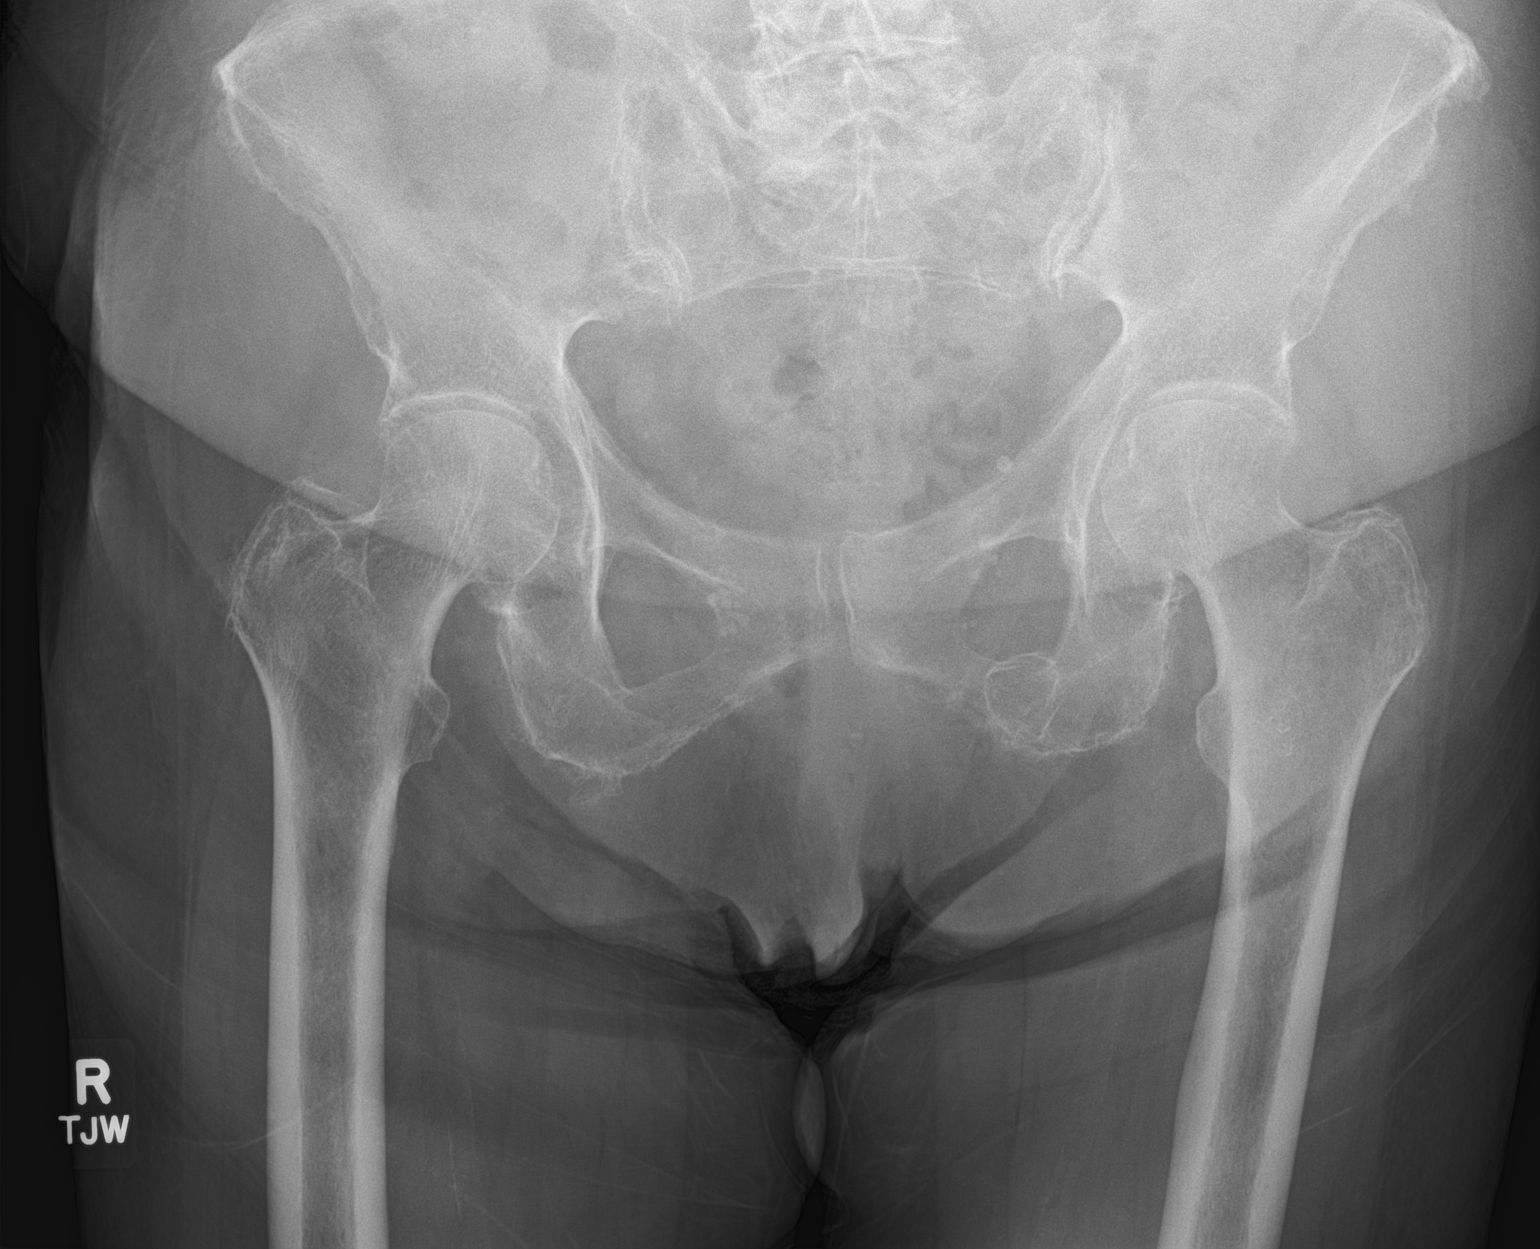

[1 of 1 positions shown; findings below may reference images not displayed]

FINDINGS: Osseous demineralization.

Superior aspects of iliac crest excluded.

Minimal narrowing of the hip joints bilaterally.

SI joints unchanged.

Old healed fracture of the LEFT inferior pubic ramus.

No acute fracture, dislocation, or bone destruction.

Degenerative disc and facet disease changes at visualized lower
lumbar spine.
IMPRESSION: No acute osseous abnormalities.

Osseous demineralization with old healed fracture of LEFT inferior
pubic ramus and minimal degenerative changes of the hip joints.

## 2018-10-27 ENCOUNTER — Emergency Department: Payer: Medicare Other

## 2018-10-27 ENCOUNTER — Other Ambulatory Visit: Payer: Self-pay

## 2018-10-27 ENCOUNTER — Encounter: Payer: Self-pay | Admitting: Emergency Medicine

## 2018-10-27 ENCOUNTER — Inpatient Hospital Stay
Admission: EM | Admit: 2018-10-27 | Discharge: 2018-11-03 | DRG: 481 | Disposition: A | Discharge status: Skilled Nursing Facility | Payer: Medicare Other | Attending: Internal Medicine | Admitting: Internal Medicine

## 2018-10-27 DIAGNOSIS — Z20828 Contact with and (suspected) exposure to other viral communicable diseases: Secondary | ICD-10-CM | POA: Diagnosis present

## 2018-10-27 DIAGNOSIS — M6282 Rhabdomyolysis: Secondary | ICD-10-CM | POA: Diagnosis present

## 2018-10-27 DIAGNOSIS — L309 Dermatitis, unspecified: Secondary | ICD-10-CM | POA: Diagnosis not present

## 2018-10-27 DIAGNOSIS — Z9012 Acquired absence of left breast and nipple: Secondary | ICD-10-CM | POA: Diagnosis not present

## 2018-10-27 DIAGNOSIS — Z820 Family history of epilepsy and other diseases of the nervous system: Secondary | ICD-10-CM

## 2018-10-27 DIAGNOSIS — R5381 Other malaise: Secondary | ICD-10-CM | POA: Diagnosis not present

## 2018-10-27 DIAGNOSIS — I10 Essential (primary) hypertension: Secondary | ICD-10-CM | POA: Diagnosis present

## 2018-10-27 DIAGNOSIS — Z881 Allergy status to other antibiotic agents status: Secondary | ICD-10-CM | POA: Diagnosis not present

## 2018-10-27 DIAGNOSIS — S72142A Displaced intertrochanteric fracture of left femur, initial encounter for closed fracture: Secondary | ICD-10-CM | POA: Diagnosis present

## 2018-10-27 DIAGNOSIS — N179 Acute kidney failure, unspecified: Secondary | ICD-10-CM

## 2018-10-27 DIAGNOSIS — I248 Other forms of acute ischemic heart disease: Secondary | ICD-10-CM | POA: Diagnosis present

## 2018-10-27 DIAGNOSIS — S72002A Fracture of unspecified part of neck of left femur, initial encounter for closed fracture: Secondary | ICD-10-CM | POA: Diagnosis not present

## 2018-10-27 DIAGNOSIS — M48061 Spinal stenosis, lumbar region without neurogenic claudication: Secondary | ICD-10-CM | POA: Diagnosis not present

## 2018-10-27 DIAGNOSIS — Z79899 Other long term (current) drug therapy: Secondary | ICD-10-CM

## 2018-10-27 DIAGNOSIS — Z853 Personal history of malignant neoplasm of breast: Secondary | ICD-10-CM | POA: Diagnosis not present

## 2018-10-27 DIAGNOSIS — Z811 Family history of alcohol abuse and dependence: Secondary | ICD-10-CM

## 2018-10-27 DIAGNOSIS — M1990 Unspecified osteoarthritis, unspecified site: Secondary | ICD-10-CM | POA: Diagnosis present

## 2018-10-27 DIAGNOSIS — S72002D Fracture of unspecified part of neck of left femur, subsequent encounter for closed fracture with routine healing: Secondary | ICD-10-CM | POA: Diagnosis not present

## 2018-10-27 DIAGNOSIS — I480 Paroxysmal atrial fibrillation: Secondary | ICD-10-CM | POA: Diagnosis present

## 2018-10-27 DIAGNOSIS — S31000A Unspecified open wound of lower back and pelvis without penetration into retroperitoneum, initial encounter: Secondary | ICD-10-CM | POA: Diagnosis not present

## 2018-10-27 DIAGNOSIS — E039 Hypothyroidism, unspecified: Secondary | ICD-10-CM | POA: Diagnosis present

## 2018-10-27 DIAGNOSIS — K219 Gastro-esophageal reflux disease without esophagitis: Secondary | ICD-10-CM | POA: Diagnosis present

## 2018-10-27 DIAGNOSIS — D62 Acute posthemorrhagic anemia: Secondary | ICD-10-CM | POA: Diagnosis not present

## 2018-10-27 DIAGNOSIS — H3530 Unspecified macular degeneration: Secondary | ICD-10-CM | POA: Diagnosis not present

## 2018-10-27 DIAGNOSIS — R7989 Other specified abnormal findings of blood chemistry: Secondary | ICD-10-CM | POA: Diagnosis not present

## 2018-10-27 DIAGNOSIS — N390 Urinary tract infection, site not specified: Secondary | ICD-10-CM | POA: Diagnosis not present

## 2018-10-27 DIAGNOSIS — Z825 Family history of asthma and other chronic lower respiratory diseases: Secondary | ICD-10-CM

## 2018-10-27 DIAGNOSIS — D72829 Elevated white blood cell count, unspecified: Secondary | ICD-10-CM | POA: Diagnosis present

## 2018-10-27 DIAGNOSIS — Z7982 Long term (current) use of aspirin: Secondary | ICD-10-CM

## 2018-10-27 DIAGNOSIS — Y92019 Unspecified place in single-family (private) house as the place of occurrence of the external cause: Secondary | ICD-10-CM

## 2018-10-27 DIAGNOSIS — W19XXXA Unspecified fall, initial encounter: Secondary | ICD-10-CM | POA: Diagnosis not present

## 2018-10-27 DIAGNOSIS — S72092A Other fracture of head and neck of left femur, initial encounter for closed fracture: Secondary | ICD-10-CM | POA: Diagnosis present

## 2018-10-27 DIAGNOSIS — I493 Ventricular premature depolarization: Secondary | ICD-10-CM | POA: Diagnosis present

## 2018-10-27 DIAGNOSIS — I4891 Unspecified atrial fibrillation: Secondary | ICD-10-CM | POA: Diagnosis not present

## 2018-10-27 DIAGNOSIS — Z03818 Encounter for observation for suspected exposure to other biological agents ruled out: Secondary | ICD-10-CM | POA: Diagnosis not present

## 2018-10-27 DIAGNOSIS — M8580 Other specified disorders of bone density and structure, unspecified site: Secondary | ICD-10-CM | POA: Diagnosis present

## 2018-10-27 DIAGNOSIS — S72142D Displaced intertrochanteric fracture of left femur, subsequent encounter for closed fracture with routine healing: Secondary | ICD-10-CM | POA: Diagnosis not present

## 2018-10-27 DIAGNOSIS — W19XXXD Unspecified fall, subsequent encounter: Secondary | ICD-10-CM | POA: Diagnosis not present

## 2018-10-27 DIAGNOSIS — F419 Anxiety disorder, unspecified: Secondary | ICD-10-CM | POA: Diagnosis present

## 2018-10-27 DIAGNOSIS — Z7401 Bed confinement status: Secondary | ICD-10-CM | POA: Diagnosis not present

## 2018-10-27 DIAGNOSIS — I6529 Occlusion and stenosis of unspecified carotid artery: Secondary | ICD-10-CM | POA: Diagnosis not present

## 2018-10-27 DIAGNOSIS — M2550 Pain in unspecified joint: Secondary | ICD-10-CM | POA: Diagnosis not present

## 2018-10-27 DIAGNOSIS — E559 Vitamin D deficiency, unspecified: Secondary | ICD-10-CM | POA: Diagnosis not present

## 2018-10-27 DIAGNOSIS — Z01810 Encounter for preprocedural cardiovascular examination: Secondary | ICD-10-CM | POA: Diagnosis not present

## 2018-10-27 DIAGNOSIS — R509 Fever, unspecified: Secondary | ICD-10-CM | POA: Diagnosis present

## 2018-10-27 DIAGNOSIS — E119 Type 2 diabetes mellitus without complications: Secondary | ICD-10-CM | POA: Diagnosis present

## 2018-10-27 DIAGNOSIS — Z8249 Family history of ischemic heart disease and other diseases of the circulatory system: Secondary | ICD-10-CM

## 2018-10-27 DIAGNOSIS — N3281 Overactive bladder: Secondary | ICD-10-CM | POA: Diagnosis not present

## 2018-10-27 DIAGNOSIS — R9431 Abnormal electrocardiogram [ECG] [EKG]: Secondary | ICD-10-CM | POA: Diagnosis not present

## 2018-10-27 DIAGNOSIS — E860 Dehydration: Secondary | ICD-10-CM

## 2018-10-27 DIAGNOSIS — M4800 Spinal stenosis, site unspecified: Secondary | ICD-10-CM | POA: Diagnosis present

## 2018-10-27 DIAGNOSIS — Z466 Encounter for fitting and adjustment of urinary device: Secondary | ICD-10-CM | POA: Diagnosis not present

## 2018-10-27 DIAGNOSIS — Z419 Encounter for procedure for purposes other than remedying health state, unspecified: Secondary | ICD-10-CM

## 2018-10-27 DIAGNOSIS — N139 Obstructive and reflux uropathy, unspecified: Secondary | ICD-10-CM | POA: Diagnosis not present

## 2018-10-27 DIAGNOSIS — S0990XA Unspecified injury of head, initial encounter: Secondary | ICD-10-CM | POA: Diagnosis not present

## 2018-10-27 DIAGNOSIS — D539 Nutritional anemia, unspecified: Secondary | ICD-10-CM | POA: Diagnosis not present

## 2018-10-27 DIAGNOSIS — R52 Pain, unspecified: Secondary | ICD-10-CM | POA: Diagnosis not present

## 2018-10-27 DIAGNOSIS — E1140 Type 2 diabetes mellitus with diabetic neuropathy, unspecified: Secondary | ICD-10-CM | POA: Diagnosis not present

## 2018-10-27 DIAGNOSIS — E785 Hyperlipidemia, unspecified: Secondary | ICD-10-CM | POA: Diagnosis present

## 2018-10-27 DIAGNOSIS — Z888 Allergy status to other drugs, medicaments and biological substances status: Secondary | ICD-10-CM | POA: Diagnosis not present

## 2018-10-27 DIAGNOSIS — Z8542 Personal history of malignant neoplasm of other parts of uterus: Secondary | ICD-10-CM | POA: Diagnosis not present

## 2018-10-27 DIAGNOSIS — Z79890 Hormone replacement therapy (postmenopausal): Secondary | ICD-10-CM

## 2018-10-27 DIAGNOSIS — M8589 Other specified disorders of bone density and structure, multiple sites: Secondary | ICD-10-CM | POA: Diagnosis not present

## 2018-10-27 DIAGNOSIS — Z800 Family history of malignant neoplasm of digestive organs: Secondary | ICD-10-CM

## 2018-10-27 DIAGNOSIS — S72122A Displaced fracture of lesser trochanter of left femur, initial encounter for closed fracture: Secondary | ICD-10-CM | POA: Diagnosis not present

## 2018-10-27 DIAGNOSIS — Z833 Family history of diabetes mellitus: Secondary | ICD-10-CM

## 2018-10-27 LAB — COMPREHENSIVE METABOLIC PANEL
ALT: 37 U/L (ref 0–44)
AST: 72 U/L — ABNORMAL HIGH (ref 15–41)
Albumin: 3.8 g/dL (ref 3.5–5.0)
Alkaline Phosphatase: 58 U/L (ref 38–126)
Anion gap: 11 (ref 5–15)
BUN: 45 mg/dL — ABNORMAL HIGH (ref 8–23)
CO2: 25 mmol/L (ref 22–32)
Calcium: 9.1 mg/dL (ref 8.9–10.3)
Chloride: 102 mmol/L (ref 98–111)
Creatinine, Ser: 2.11 mg/dL — ABNORMAL HIGH (ref 0.44–1.00)
GFR calc Af Amer: 23 mL/min — ABNORMAL LOW (ref >60)
GFR calc non Af Amer: 20 mL/min — ABNORMAL LOW (ref >60)
Glucose, Bld: 149 mg/dL — ABNORMAL HIGH (ref 70–99)
Potassium: 3.6 mmol/L (ref 3.5–5.1)
Sodium: 138 mmol/L (ref 135–145)
Total Bilirubin: 1.4 mg/dL — ABNORMAL HIGH (ref 0.3–1.2)
Total Protein: 7 g/dL (ref 6.5–8.1)

## 2018-10-27 LAB — CBC WITH DIFFERENTIAL/PLATELET
Abs Immature Granulocytes: 0.36 10*3/uL — ABNORMAL HIGH (ref 0.00–0.07)
Basophils Absolute: 0 10*3/uL (ref 0.0–0.1)
Basophils Relative: 0 %
Eosinophils Absolute: 0 10*3/uL (ref 0.0–0.5)
Eosinophils Relative: 0 %
HCT: 32.9 % — ABNORMAL LOW (ref 36.0–46.0)
Hemoglobin: 10.8 g/dL — ABNORMAL LOW (ref 12.0–15.0)
Immature Granulocytes: 2 %
Lymphocytes Relative: 5 %
Lymphs Abs: 0.8 10*3/uL (ref 0.7–4.0)
MCH: 30.4 pg (ref 26.0–34.0)
MCHC: 32.8 g/dL (ref 30.0–36.0)
MCV: 92.7 fL (ref 80.0–100.0)
Monocytes Absolute: 1.4 10*3/uL — ABNORMAL HIGH (ref 0.1–1.0)
Monocytes Relative: 8 %
Neutro Abs: 15.4 10*3/uL — ABNORMAL HIGH (ref 1.7–7.7)
Neutrophils Relative %: 85 %
Platelets: 297 10*3/uL (ref 150–400)
RBC: 3.55 MIL/uL — ABNORMAL LOW (ref 3.87–5.11)
RDW: 14.6 % (ref 11.5–15.5)
Smear Review: NORMAL
WBC: 18 10*3/uL — ABNORMAL HIGH (ref 4.0–10.5)
nRBC: 0 % (ref 0.0–0.2)

## 2018-10-27 LAB — HEMOGLOBIN A1C
Hgb A1c MFr Bld: 5.7 % — ABNORMAL HIGH (ref 4.8–5.6)
Mean Plasma Glucose: 116.89 mg/dL

## 2018-10-27 LAB — TROPONIN I (HIGH SENSITIVITY)
Troponin I (High Sensitivity): 64 ng/L — ABNORMAL HIGH (ref <18)
Troponin I (High Sensitivity): 59 ng/L — ABNORMAL HIGH (ref <18)

## 2018-10-27 LAB — MRSA PCR SCREENING: MRSA by PCR: NEGATIVE

## 2018-10-27 LAB — SARS CORONAVIRUS 2 BY RT PCR (HOSPITAL ORDER, PERFORMED IN ~~LOC~~ HOSPITAL LAB): SARS Coronavirus 2: NEGATIVE

## 2018-10-27 LAB — CK: Total CK: 3126 U/L — ABNORMAL HIGH (ref 38–234)

## 2018-10-27 LAB — GLUCOSE, CAPILLARY: Glucose-Capillary: 120 mg/dL — ABNORMAL HIGH (ref 70–99)

## 2018-10-27 MED ORDER — SODIUM CHLORIDE 0.9 % IV SOLN
Freq: Once | INTRAVENOUS | Status: AC
  Administered 2018-10-27: 13:00:00 via INTRAVENOUS

## 2018-10-27 MED ORDER — CHLORHEXIDINE GLUCONATE CLOTH 2 % EX PADS
6.0000 | MEDICATED_PAD | Freq: Every day | CUTANEOUS | Status: DC
  Administered 2018-10-29: 6 via TOPICAL

## 2018-10-27 MED ORDER — SODIUM CHLORIDE 0.9 % IV SOLN
INTRAVENOUS | Status: DC
  Administered 2018-10-27: 100 mL/h via INTRAVENOUS
  Administered 2018-10-27 – 2018-10-28 (×2): via INTRAVENOUS

## 2018-10-27 MED ORDER — DILTIAZEM HCL 100 MG IV SOLR
5.0000 mg/h | INTRAVENOUS | Status: DC
  Filled 2018-10-27: qty 100

## 2018-10-27 MED ORDER — MORPHINE SULFATE (PF) 2 MG/ML IV SOLN
2.0000 mg | INTRAVENOUS | Status: DC | PRN
  Administered 2018-10-27 – 2018-10-30 (×9): 2 mg via INTRAVENOUS
  Filled 2018-10-27 (×9): qty 1

## 2018-10-27 MED ORDER — ACETAMINOPHEN 325 MG PO TABS
650.0000 mg | ORAL_TABLET | Freq: Four times a day (QID) | ORAL | Status: DC | PRN
  Administered 2018-11-01: 650 mg via ORAL
  Filled 2018-10-27: qty 2

## 2018-10-27 MED ORDER — DIGOXIN 0.25 MG/ML IJ SOLN
0.1250 mg | Freq: Once | INTRAMUSCULAR | Status: AC
  Administered 2018-10-27: 0.125 mg via INTRAVENOUS
  Filled 2018-10-27 (×2): qty 2

## 2018-10-27 MED ORDER — HYDROCODONE-ACETAMINOPHEN 5-325 MG PO TABS
1.0000 | ORAL_TABLET | ORAL | Status: DC | PRN
  Administered 2018-10-29: 1 via ORAL
  Administered 2018-10-30 – 2018-11-01 (×5): 2 via ORAL
  Administered 2018-11-03: 1 via ORAL
  Filled 2018-10-27 (×3): qty 2
  Filled 2018-10-27: qty 1
  Filled 2018-10-27: qty 2
  Filled 2018-10-27: qty 1
  Filled 2018-10-27 (×2): qty 2

## 2018-10-27 MED ORDER — ONDANSETRON HCL 4 MG/2ML IJ SOLN: 4.0000 mg | Freq: Four times a day (QID) | INTRAMUSCULAR | Status: DC | PRN

## 2018-10-27 MED ORDER — METOPROLOL TARTRATE 25 MG PO TABS
25.0000 mg | ORAL_TABLET | Freq: Two times a day (BID) | ORAL | Status: DC
  Administered 2018-10-28 – 2018-10-30 (×5): 25 mg via ORAL
  Filled 2018-10-27 (×6): qty 1

## 2018-10-27 MED ORDER — LEVOTHYROXINE SODIUM 50 MCG PO TABS
50.0000 ug | ORAL_TABLET | Freq: Every day | ORAL | Status: DC
  Administered 2018-10-28 – 2018-11-03 (×7): 50 ug via ORAL
  Filled 2018-10-27 (×7): qty 1

## 2018-10-27 MED ORDER — DILTIAZEM HCL 25 MG/5ML IV SOLN
10.0000 mg | Freq: Once | INTRAVENOUS | Status: AC
  Administered 2018-10-27: 10 mg via INTRAVENOUS
  Filled 2018-10-27 (×2): qty 5

## 2018-10-27 MED ORDER — DILTIAZEM HCL-DEXTROSE 100-5 MG/100ML-% IV SOLN (PREMIX)
5.0000 mg/h | INTRAVENOUS | Status: DC
  Filled 2018-10-27: qty 100

## 2018-10-27 MED ORDER — GABAPENTIN 100 MG PO CAPS
100.0000 mg | ORAL_CAPSULE | Freq: Two times a day (BID) | ORAL | Status: DC
  Administered 2018-10-27 – 2018-11-03 (×14): 100 mg via ORAL
  Filled 2018-10-27 (×14): qty 1

## 2018-10-27 MED ORDER — ACETAMINOPHEN 650 MG RE SUPP: 650.0000 mg | Freq: Four times a day (QID) | RECTAL | Status: DC | PRN

## 2018-10-27 MED ORDER — ONDANSETRON HCL 4 MG PO TABS: 4.0000 mg | ORAL_TABLET | Freq: Four times a day (QID) | ORAL | Status: DC | PRN

## 2018-10-27 NOTE — ED Notes (Signed)
Provider Rudene Christians at bedside. Will have pt transported once provider done.

## 2018-10-27 NOTE — Progress Notes (Signed)
Advanced care plan.  Purpose of the Encounter: CODE STATUS  Parties in Attendance: Patient herself and her friend  Patient's Decision Capacity: Intact  Subjective/Patient's story: Zyairah Wacha  is a 85 y.o. female with a known history of essential hypertension, hypothyroidism and anxiety who resides by herself.  Who 2 days ago was trying to lock her door from inside and fell backwards.  Patient stayed on the ground overnight.  Then her son came and put her in a recliner yesterday but her friend came to check on her she continued to have significant pain therefore EMS was called.  Patient noted to have a left-sided hip fracture she also is noted to have rhabdo.   Objective/Medical story I discussed with the patient regarding her desires for cardiac and pulmonary resuscitation.  Goals of care determination:   Patient states that she would like everything to be done for now but wants to decide on her CODE STATUS  CODE STATUS:  Full code for now will need readdressed allowing patient to think about her options  Time spent discussing advanced care planning: 16 minutes
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
## 2018-10-27 NOTE — Progress Notes (Signed)
eLink Physician-Brief Progress Note Patient Name: [name] DOB: 18-Sep-1926 MRN: [id number]   Date of Service  10/27/2018  HPI/Events of Note  Pt admitted with a comminuted fracture awaiting OR repair. Pt also has rhabdomyolysis and slight bump in troponin which has peaked already and likely represents demand.  eICU Interventions  New patient evaluation completed.        Kerry Kass [place] 10/27/2018, 8:19 PM

## 2018-10-27 NOTE — Consult Note (Signed)
Reason for Consult: Left hip pain Referring Physician: [name] is an 85 y.o. female.  HPI: Patient suffered a fall at home 2 days ago and was on the ground overnight the following day her son put her up into a recliner and since then she not been ambulatory and having hip pain she is brought to the emergency room today after a friend saw her and was noted to have a comminuted unstable left intertrochanteric hip fracture.  The patient is alert and oriented she describes her activity level as being mainly around the house she sometimes uses a walker sometimes a cane.  Her largest amount of activity is bringing her garbage can to the street.  She denies any prodromal symptoms.  Denies loss of consciousness.  Past Medical History:  Diagnosis Date  . Anxiety 12/2002  . Arthritis    hands  . Cancer ([place])    breast left arm  . Cancer ([place])    uterine  . Diabetes mellitus 11/2004   Type 2  . Dyspnea    WITH EXERTION  . GERD (gastroesophageal reflux disease)   . Hyperlipidemia 11/2004  . Hypertension 12/2002  . Hypothyroidism   . Osteopenia 02/2002   02/2002 Dexa osteopenia// Dexa 02/26/2004 (Dr. Ubaldo Glassing) stable/IMR femur/spine  . Spinal stenosis   . 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 Ronnald Collum endocrinologist  . Vertigo     Past Surgical History:  Procedure Laterality Date  . ABDOMINAL HYSTERECTOMY  1990   endometrial adenoca  . BREAST SURGERY  1980   left breast lumpectomy //abscess of breast evac.  Marland Kitchen CATARACT EXTRACTION W/PHACO Left 12/11/2016   Procedure: CATARACT EXTRACTION PHACO AND INTRAOCULAR LENS PLACEMENT (IOC);  Surgeon: Eulogio Bear, MD;  Location: [facility] ORS;  Service: Ophthalmology;  Laterality: Left;  [place] 1:18.4 AP 36.6% CDE 16.13  . CATARACT EXTRACTION W/PHACO Right 01/01/2017   Procedure: CATARACT EXTRACTION PHACO AND INTRAOCULAR LENS PLACEMENT (IOC);  Surgeon: Eulogio Bear, MD;  Location: [facility] ORS;  Service:  Ophthalmology;  Laterality: Right;  [place]: 01:45.5 AP% 16.6 CDE 18.21 Fluid pack lot # [id number]  . CHOLECYSTECTOMY  07/10/1987  . EYE SURGERY     Tear Duct surgery, b/l cataract 2019   . MASTECTOMY Left     Family History  Problem Relation Age of Onset  . Alzheimer's disease Mother   . Heart disease Mother 44       angina  . Hypertension Mother   . Crohn's disease Mother        ? crohns disease  . Cancer Father 68       colon cancer  . Cancer Sister 86       breast cancer   . COPD Sister        emphysema (smoker)  . Heart disease Sister        angina  . Alcohol abuse Brother   . Diabetes Paternal Aunt   . Hypertension Sister   . Stroke Neg Hx     Social History:  reports that she has never smoked. She has never used smokeless tobacco. She reports that she does not drink alcohol or use drugs.  Allergies:  Allergies  Allergen Reactions  . Amlodipine Other (See Comments)    Leg edema   . Atenolol Other (See Comments)    REACTION: bradycardia  . Erythromycin Base Other (See Comments)    REACTION: stomach upset  . Lisinopril Other (See Comments)  REACTION: cough  (20 mg)    Medications: I have reviewed the patient's current medications.  Results for orders placed or performed during the hospital encounter of 10/27/18 (from the past 48 hour(s))  CBC with Differential     Status: Abnormal   Collection Time: 10/27/18 11:15 AM  Result Value Ref Range   WBC 18.0 (H) 4.0 - 10.5 K/uL   RBC 3.55 (L) 3.87 - 5.11 MIL/uL   Hemoglobin 10.8 (L) 12.0 - 15.0 g/dL   HCT 32.9 (L) 36.0 - 46.0 %   MCV 92.7 80.0 - 100.0 fL   MCH 30.4 26.0 - 34.0 pg   MCHC 32.8 30.0 - 36.0 g/dL   RDW 14.6 11.5 - 15.5 %   Platelets 297 150 - 400 K/uL   nRBC 0.0 0.0 - 0.2 %   Neutrophils Relative % 85 %   Neutro Abs 15.4 (H) 1.7 - 7.7 K/uL   Lymphocytes Relative 5 %   Lymphs Abs 0.8 0.7 - 4.0 K/uL   Monocytes Relative 8 %   Monocytes Absolute 1.4 (H) 0.1 - 1.0 K/uL   Eosinophils Relative 0 %    Eosinophils Absolute 0.0 0.0 - 0.5 K/uL   Basophils Relative 0 %   Basophils Absolute 0.0 0.0 - 0.1 K/uL   WBC Morphology MORPHOLOGY UNREMARKABLE    RBC Morphology MORPHOLOGY UNREMARKABLE    Smear Review Normal platelet morphology    Immature Granulocytes 2 %   Abs Immature Granulocytes 0.36 (H) 0.00 - 0.07 K/uL    Comment: Performed at [facility], [address] [place], [place] [address]  Comprehensive metabolic panel     Status: Abnormal   Collection Time: 10/27/18 11:15 AM  Result Value Ref Range   Sodium 138 135 - 145 mmol/L   Potassium 3.6 3.5 - 5.1 mmol/L   Chloride 102 98 - 111 mmol/L   CO2 25 22 - 32 mmol/L   Glucose, Bld 149 (H) 70 - 99 mg/dL   BUN 45 (H) 8 - 23 mg/dL   Creatinine, Ser 2.11 (H) 0.44 - 1.00 mg/dL   Calcium 9.1 8.9 - 10.3 mg/dL   Total Protein 7.0 6.5 - 8.1 g/dL   Albumin 3.8 3.5 - 5.0 g/dL   AST 72 (H) 15 - 41 U/L   ALT 37 0 - 44 U/L   Alkaline Phosphatase 58 38 - 126 U/L   Total Bilirubin 1.4 (H) 0.3 - 1.2 mg/dL   GFR calc non Af Amer 20 (L) >60 mL/min   GFR calc Af Amer 23 (L) >60 mL/min   Anion gap 11 5 - 15    Comment: Performed at [facility], [place], [place] [address]  Troponin I (High Sensitivity)     Status: Abnormal   Collection Time: 10/27/18 11:15 AM  Result Value Ref Range   Troponin I (High Sensitivity) 64 (H) <18 ng/L    Comment: (NOTE) Elevated high sensitivity troponin I (hsTnI) values and significant  changes across serial measurements may suggest ACS but many other  chronic and acute conditions are known to elevate hsTnI results.  Refer to the "Links" section for chest pain algorithms and additional  guidance. Performed at [facility], [address] [place], [place] [address]   CK     Status: Abnormal   Collection Time: 10/27/18 11:15 AM  Result Value Ref Range   Total CK 3,126 (H) 38 - 234 U/L    Comment: Performed at [facility], [place]  [address]  [place], [place] [address]  SARS Coronavirus 2 [facility] order, Performed in [facility] hospital lab) Nasopharyngeal Nasopharyngeal Swab     Status: None   Collection Time: 10/27/18 12:16 PM   Specimen: Nasopharyngeal Swab  Result Value Ref Range   SARS Coronavirus 2 NEGATIVE NEGATIVE    Comment: (NOTE) If result is NEGATIVE SARS-CoV-2 target nucleic acids are NOT DETECTED. The SARS-CoV-2 RNA is generally detectable in upper and lower  respiratory specimens during the acute phase of infection. The lowest  concentration of SARS-CoV-2 viral copies this assay can detect is 250  copies / mL. A negative result does not preclude SARS-CoV-2 infection  and should not be used as the sole basis for treatment or other  patient management decisions.  A negative result may occur with  improper specimen collection / handling, submission of specimen other  than nasopharyngeal swab, presence of viral mutation(s) within the  areas targeted by this assay, and inadequate number of viral copies  (<250 copies / mL). A negative result must be combined with clinical  observations, patient history, and epidemiological information. If result is POSITIVE SARS-CoV-2 target nucleic acids are DETECTED. The SARS-CoV-2 RNA is generally detectable in upper and lower  respiratory specimens dur ing the acute phase of infection.  Positive  results are indicative of active infection with SARS-CoV-2.  Clinical  correlation with patient history and other diagnostic information is  necessary to determine patient infection status.  Positive results do  not rule out bacterial infection or co-infection with other viruses. If result is PRESUMPTIVE POSTIVE SARS-CoV-2 nucleic acids MAY BE PRESENT.   A presumptive positive result was obtained on the submitted specimen  and confirmed on repeat testing.  While 2019 novel coronavirus  (SARS-CoV-2) nucleic acids may be present in the submitted sample  additional confirmatory testing may  be necessary for epidemiological  and / or clinical management purposes  to differentiate between  SARS-CoV-2 and other Sarbecovirus currently known to infect humans.  If clinically indicated additional testing with an alternate test  methodology [id number]) is advised. The SARS-CoV-2 RNA is generally  detectable in upper and lower respiratory sp ecimens during the acute  phase of infection. The expected result is Negative. Fact Sheet for Patients:  StrictlyIdeas.no Fact Sheet for Healthcare Providers: BankingDealers.co.za This test is not yet approved or cleared by the [place] FDA and has been authorized for detection and/or diagnosis of SARS-CoV-2 by FDA under an Emergency Use Authorization (EUA).  This EUA will remain in effect (meaning this test can be used) for the duration of the COVID-19 declaration under Section 564(b)(1) of the Act, 21 U.S.C. section 360bbb-3(b)(1), unless the authorization is terminated or revoked sooner. Performed at [facility], [address], [place], [place] [address]     Dg Chest 1 View  Result Date: 10/27/2018 CLINICAL DATA:  85 year old female with fall. EXAM: CHEST  1 VIEW COMPARISON:  Chest radiograph dated 02/04/2014 FINDINGS: The lungs are clear. There is no pleural effusion or pneumothorax. The cardiac silhouette is within normal limits. Atherosclerotic calcification of the tortuous aorta. There is osteopenia with multilevel degenerative changes of the spine and shoulder. Elevated right shoulder, likely related to chronic rotator cuff injury. No acute osseous pathology. IMPRESSION: 1. No active disease. 2. Osseous structures are without acute abnormality. Electronically Signed   By: Anner Crete M.D.   On: 10/27/2018 12:12   Ct Head Wo Contrast  Result Date: 10/27/2018 CLINICAL DATA:  Patient had a fall on Monday  EXAM: CT HEAD WITHOUT CONTRAST TECHNIQUE: Contiguous axial images were  obtained from the base of the skull through the vertex without intravenous contrast. COMPARISON:  Multiple priors, most recent February 04, 2014. FINDINGS: Brain: No evidence of acute territorial infarction, hemorrhage, hydrocephalus,extra-axial collection or mass lesion/mass effect. There is dilatation the ventricles and sulci consistent with age-related atrophy. Low-attenuation changes in the deep white matter consistent with small vessel ischemia. Vascular: No hyperdense vessel or unexpected calcification. Skull: The skull is intact. No fracture or focal lesion identified. Sinuses/Orbits: The visualized paranasal sinuses and mastoid air cells are clear. The orbits and globes intact. Other: None IMPRESSION: No acute intracranial abnormality. Findings consistent with age related atrophy and chronic small vessel ischemia Electronically Signed   By: Prudencio Pair M.D.   On: 10/27/2018 13:03   Dg Hip Unilat W Or Wo Pelvis 2-3 Views Left  Result Date: 10/27/2018 CLINICAL DATA:  Unwitnessed fall EXAM: DG HIP (WITH OR WITHOUT PELVIS) 2-3V LEFT COMPARISON:  February 04, 2014. FINDINGS: Comminuted fracture of the left hip through the proximal femur involving the greater and lesser trochanter is seen. There is overlap of fracture fragments. There is mild displacement of the lesser trochanter. The femoral head still appears to be well seated within the acetabulum. There is diffuse osteopenia. Limited visualization of the left inferior pubic rami. Degenerative changes in the lower lumbar spine. IMPRESSION: Comminuted proximal left femoral fracture involving the greater and lesser trochanter. Electronically Signed   By: Prudencio Pair M.D.   On: 10/27/2018 12:11    ROS Blood pressure 124/82, pulse 76, temperature 99.2 F (37.3 C), temperature source Oral, resp. rate 16, height 5\' 2"  (1.575 m), weight 63.5 kg, SpO2 99 %. Physical Exam Her left leg is slightly shortened and externally rotated with palpable dorsalis pedis  pulse and intact sensation to the foot.  Her skin is intact around the left hip without ecchymosis.  Radiographs were reviewed and show comminuted left hip fracture without a great deal of osteoarthritis.   Assessment/Plan: Impression is comminuted left intertrochanteric hip fracture with plan of open reduction internal fixation when medically stabilized as she is dehydrated and presents with acute kidney injury related to this.  Risk benefits possible complications were discussed with the patient and site marked.  [name] 10/27/2018, 3:37 PM

## 2018-10-27 NOTE — NC FL2 (Signed)
  [place] LEVEL OF CARE SCREENING TOOL     IDENTIFICATION  Patient Name: [name] Birthdate: 1926-04-06 Sex: female Admission Date (Current Location): 10/27/2018  [name] and [place] Number:  Engineering geologist and Address:  [facility], [address], [place], [place] [address]      Provider Number: [id number]  Attending Physician Name and Address:  Dustin Flock, MD  Relative Name and Phone Number:  Imagene Gurney daughter [phone]    Current Level of Care: Hospital Recommended Level of Care: [place] Prior Approval Number:    Date Approved/Denied:   PASRR Number: [id number]  Discharge Plan: SNF    Current Diagnoses: Patient Active Problem List   Diagnosis Date Noted  . Comminuted fracture of left hip ([place]) 10/27/2018  . Osteoarthritis 03/12/2018  . Overactive bladder 03/12/2018  . Carotid artery stenosis 01/08/2018  . Vitamin D deficiency 01/08/2018  . Poor balance 10/16/2017  . Fall at home 10/15/2017  . Buttock pain 10/15/2017  . Contusion, buttock 10/15/2017  . Macular degeneration 03/20/2017  . Thickened nails 03/20/2017  . History of rectal bleeding 05/21/2016  . Eczema 05/16/2014  . Spinal stenosis of lumbar region 02/15/2014  . Right foot pain 09/23/2013  . Right shoulder pain 09/23/2013  . Other screening mammogram 04/12/2013  . Dizziness 05/21/2012  . Falls frequently 05/21/2012  . Urge incontinence 12/26/2011  . Hip pain, bilateral 09/15/2011  . Special screening for malignant neoplasms, colon 07/10/2010  . Hypothyroidism 04/30/2010  . CONSTIPATION 04/30/2010  . STRESS REACTION, ACUTE, WITH EMOTIONAL DISTURBANCE 10/09/2009  . Prediabetes 07/22/2009  . Malignant neoplasm of female breast ([place]) 12/29/2007  . Spinal stenosis, lumbar 06/22/2006  . PLUMMER'S DISEASE 06/16/2006  . Hyperlipidemia 06/16/2006  . ANXIETY 06/16/2006  . Essential hypertension 06/16/2006  .  Osteopenia 06/16/2006    Orientation RESPIRATION BLADDER Height & Weight     Self, Time, Situation, Place  Normal Continent Weight: 63.5 kg Height:  5\' 2"  (157.5 cm)  BEHAVIORAL SYMPTOMS/MOOD NEUROLOGICAL BOWEL NUTRITION STATUS      Continent    AMBULATORY STATUS COMMUNICATION OF NEEDS Skin   Extensive Assist Verbally Normal, Surgical wounds                       Personal Care Assistance Level of Assistance  Bathing, Dressing Bathing Assistance: Limited assistance   Dressing Assistance: Limited assistance     Functional Limitations Info  Sight, Hearing, Speech Sight Info: Adequate Hearing Info: Adequate Speech Info: Adequate    SPECIAL CARE FACTORS FREQUENCY  PT (By licensed PT)     PT Frequency: 5 times per week              Contractures Contractures Info: Not present    Additional Factors Info  Allergies   Allergies Info: Amlodipine, Atenolol, Erythromycin Base, Lisinopril           Current Medications (10/27/2018):  This is the current hospital active medication list Current Facility-Administered Medications  Medication Dose Route Frequency Provider Last Rate Last Dose  . morphine 2 MG/ML injection 2 mg  2 mg Intravenous Q4H PRN Dustin Flock, MD   2 mg at 10/27/18 1425     Discharge Medications: Please see discharge summary for a list of discharge medications.  Relevant Imaging Results:  Relevant Lab Results:   Additional Information [id number]  Su Hilt, RN

## 2018-10-27 NOTE — ED Notes (Signed)
Family member at bedside. Admitting provider at bedside.

## 2018-10-27 NOTE — ED Notes (Signed)
Family member at bedside.

## 2018-10-27 NOTE — Consult Note (Signed)
Cardiology Consultation:   Patient ID: [name] MRN: [id number]; DOB: Dec 01, 1926  Admit date: 10/27/2018 Date of Consult: 10/27/2018  Primary Care Provider: McLean-Scocuzza, Nino Glow, MD Primary Cardiologist: New - Gurinder Toral Primary Electrophysiologist:  None    Patient Profile:   [name] is a 85 y.o. female with a hx of hypertension, type 2 diabetes mellitus, hypothyroidism, anxiety, left breast cancer status post mastectomy, who is being seen today for the evaluation of elevated troponin and perioperative cardiovascular risk assessment at the request of Patel.  History of Present Illness:   [name] reports being in her usual state of health until about 2 days ago when she lost her balance while trying to lock her door.  She fell backwards onto her left hip and was a unable to get up on her own.  She will remain on the ground overnight and was found the next day by her son.  He placed her into a recliner, where the patient stayed for another day before being found.  EMS was called and the patient brought to the [facility] emergency department where a comminuted fracture of the left femur was identified.  At this time, the patient complains of mild left hip discomfort but is otherwise asymptomatic.  In the emergency department, labs were notable for acute kidney injury and elevated creatine kinase consistent with rhabdomyolysis.  High-sensitivity troponin was also checked for unclear reasons and found to be slightly elevated at 64.  [name] denies history of heart disease.  She has not had any chest pain, shortness of breath, palpitations, or edema.  Heart Pathway Score:     Past Medical History:  Diagnosis Date   Anxiety 12/2002   Arthritis    hands   Cancer ([place])    breast left arm   Cancer ([place])    uterine   Diabetes mellitus 11/2004   Type 2   Dyspnea    WITH EXERTION   GERD (gastroesophageal reflux disease)    Hyperlipidemia 11/2004   Hypertension 12/2002    Hypothyroidism    Osteopenia 02/2002   02/2002 Dexa osteopenia// Dexa 02/26/2004 (Dr. 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 Ronnald Collum endocrinologist   Vertigo     Past Surgical History:  Procedure Laterality Date   ABDOMINAL HYSTERECTOMY  1990   endometrial adenoca   BREAST [place]   left breast lumpectomy //abscess of breast evac.   CATARACT EXTRACTION W/PHACO Left 12/11/2016   Procedure: CATARACT EXTRACTION PHACO AND INTRAOCULAR LENS PLACEMENT (IOC);  Surgeon: Eulogio Bear, MD;  Location: [facility] ORS;  Service: Ophthalmology;  Laterality: Left;  [place] 1:18.4 AP 36.6% CDE 16.13   CATARACT EXTRACTION W/PHACO Right 01/01/2017   Procedure: CATARACT EXTRACTION PHACO AND INTRAOCULAR LENS PLACEMENT (IOC);  Surgeon: Eulogio Bear, MD;  Location: [facility] ORS;  Service: Ophthalmology;  Laterality: Right;  [place]: 01:45.5 AP% 16.6 CDE 18.21 Fluid pack lot # [id number]   CHOLECYSTECTOMY  07/10/1987   EYE SURGERY     Tear Duct surgery, b/l cataract 2019    MASTECTOMY Left      Home Medications:  Prior to Admission medications   Medication Sig Start Date Johsua Shevlin Date Taking? Authorizing Provider  aspirin 81 MG tablet Take 81 mg by mouth daily. Daily with food    Yes [provider]  Cholecalciferol 1.25 MG (50000 UT) capsule Take 1 capsule (50,000 Units total) by mouth once a  week. 03/12/18  Yes McLean-Scocuzza, Nino Glow, MD  gabapentin (NEURONTIN) 100 MG capsule TAKE ONE (1) CAPSULE BY MOUTH 2 TIMES DAILY 03/12/18  Yes McLean-Scocuzza, Nino Glow, MD  levothyroxine (SYNTHROID, LEVOTHROID) 50 MCG tablet Take 1 tablet (50 mcg total) by mouth daily. 03/12/18  Yes McLean-Scocuzza, Nino Glow, MD  losartan-hydrochlorothiazide (HYZAAR) 100-25 MG tablet Take 1 tablet by mouth daily. 03/12/18  Yes McLean-Scocuzza, Nino Glow, MD  vitamin C (ASCORBIC ACID) 500 MG tablet Take 500 mg by mouth daily.   Yes [provider]  diclofenac sodium (VOLTAREN) 1 % GEL Apply 2 g topically 4 (four) times daily. Upper body and 4 grams lower body 03/12/18   McLean-Scocuzza, Nino Glow, MD  mometasone (ELOCON) 0.1 % cream Apply 1 application topically daily. To affected areas behind ears 03/26/17   Tower, Roque Lias A, MD  polyethylene glycol powder (GLYCOLAX/MIRALAX) powder Take 17 g by mouth daily as needed. As directed for constipation 03/26/17   Tower, Wynelle Fanny, MD    Inpatient Medications: Scheduled Meds:  Continuous Infusions:  PRN Meds: morphine injection  Allergies:    Allergies  Allergen Reactions   Amlodipine Other (See Comments)    Leg edema    Atenolol Other (See Comments)    REACTION: bradycardia   Erythromycin Base Other (See Comments)    REACTION: stomach upset   Lisinopril Other (See Comments)    REACTION: cough  (20 mg)    Social History:   Social History   Socioeconomic History   Marital status: Married    Spouse name: Not on file   Number of children: Not on file   Years of education: Not on file   Highest education level: Not on file  Occupational History   Not on file  Social Needs   Financial resource strain: Not on file   Food insecurity    Worry: Not on file    Inability: Not on file   Transportation needs    Medical: Not on file    Non-medical: Not on file  Tobacco Use   Smoking status: Never Smoker   Smokeless tobacco: Never Used  Substance and Sexual Activity   Alcohol use: No    Alcohol/week: 0.0 standard drinks   Drug use: No   Sexual activity: Not on file  Lifestyle   Physical activity    Days per week: Not on file    Minutes per session: Not on file   Stress: Not on file  Relationships   Social connections    Talks on phone: Not on file    Gets together: Not on file    Attends religious service: Not on file    Active member of club or organization: Not on file    Attends meetings of clubs or organizations: Not on file     Relationship status: Not on file   Intimate partner violence    Fear of current or ex partner: Not on file    Emotionally abused: Not on file    Physically abused: Not on file    Forced sexual activity: Not on file  Other Topics Concern   Not on file  Social History Narrative   Takes care of husband full time who has alzheimers -- very difficult with little help   Relies on family I.e granddaughter and grandaughters other grandmother to bring to appts       Has dog [place]    1 son living 1 son died June 03, 2009 suicide     Family  History:    Family History  Problem Relation Age of Onset   Alzheimer's disease Mother    Heart disease Mother 92       angina   Hypertension Mother    Crohn's disease Mother        ? crohns disease   Cancer Father 37       colon cancer   Cancer Sister 78       breast cancer    COPD Sister        emphysema (smoker)   Heart disease Sister        angina   Alcohol abuse Brother    Diabetes Paternal Aunt    Hypertension Sister    Stroke Neg Hx      ROS:  Please see the history of present illness. All other ROS reviewed and negative.     Physical Exam/Data:   Vitals:   10/27/18 1400 10/27/18 1430 10/27/18 1445 10/27/18 1643  BP: (!) 155/62 124/82  (!) 141/71  Pulse: 64 78 76 (!) 122  Resp: 17 (!) 21 16 16   Temp:    98.7 F (37.1 C)  TempSrc:    Oral  SpO2: 99% 98% 99% 97%  Weight:      Height:       No intake or output data in the 24 hours ending 10/27/18 1653 Last 3 Weights 10/27/2018 03/12/2018 01/08/2018  Weight (lbs) 140 lb 142 lb 138 lb 9.6 oz  Weight (kg) 63.504 kg 64.411 kg 62.869 kg     Body mass index is 25.61 kg/m.  General: Elderly woman, lying in bed. HEENT: normal Lymph: no adenopathy Neck: Lying flat; unable to assess JVP. Endocrine:  No thryomegaly Vascular: 2+ radial and pedal pulses bilaterally. Cardiac:  normal S1, S2; RRR; no murmurs, rubs, or gallops Lungs:  clear to auscultation bilaterally, no  wheezing, rhonchi or rales  Abd: soft, nontender, no hepatomegaly  Ext: no edema Musculoskeletal:  No deformities, BUE and BLE strength normal and equal Skin: warm and dry  Neuro:  CNs 2-12 intact, no focal abnormalities noted Psych:  Normal affect   EKG:  The EKG was personally reviewed and demonstrates: Normal sinus rhythm with nonspecific ST/T changes. Telemetry: Patient not on telemetry.  Relevant CV Studies: None.  Laboratory Data:  High Sensitivity Troponin:   Recent Labs  Lab 10/27/18 1115  TROPONINIHS 64*     Cardiac EnzymesNo results for input(s): TROPONINI in the last 168 hours. No results for input(s): TROPIPOC in the last 168 hours.  Chemistry Recent Labs  Lab 10/27/18 1115  NA 138  K 3.6  CL 102  CO2 25  GLUCOSE 149*  BUN 45*  CREATININE 2.11*  CALCIUM 9.1  GFRNONAA 20*  GFRAA 23*  ANIONGAP 11    Recent Labs  Lab 10/27/18 1115  PROT 7.0  ALBUMIN 3.8  AST 72*  ALT 37  ALKPHOS 58  BILITOT 1.4*   Hematology Recent Labs  Lab 10/27/18 1115  WBC 18.0*  RBC 3.55*  HGB 10.8*  HCT 32.9*  MCV 92.7  MCH 30.4  MCHC 32.8  RDW 14.6  PLT 297   BNPNo results for input(s): BNP, PROBNP in the last 168 hours.  DDimer No results for input(s): DDIMER in the last 168 hours.   Radiology/Studies:  Dg Chest 1 View  Result Date: 10/27/2018 CLINICAL DATA:  85 year old female with fall. EXAM: CHEST  1 VIEW COMPARISON:  Chest radiograph dated 02/04/2014 FINDINGS: The lungs are clear. There is no  pleural effusion or pneumothorax. The cardiac silhouette is within normal limits. Atherosclerotic calcification of the tortuous aorta. There is osteopenia with multilevel degenerative changes of the spine and shoulder. Elevated right shoulder, likely related to chronic rotator cuff injury. No acute osseous pathology. IMPRESSION: 1. No active disease. 2. Osseous structures are without acute abnormality. Electronically Signed   By: Anner Crete M.D.   On: 10/27/2018  12:12   Ct Head Wo Contrast  Result Date: 10/27/2018 CLINICAL DATA:  Patient had a fall on Monday EXAM: CT HEAD WITHOUT CONTRAST TECHNIQUE: Contiguous axial images were obtained from the base of the skull through the vertex without intravenous contrast. COMPARISON:  Multiple priors, most recent February 04, 2014. FINDINGS: Brain: No evidence of acute territorial infarction, hemorrhage, hydrocephalus,extra-axial collection or mass lesion/mass effect. There is dilatation the ventricles and sulci consistent with age-related atrophy. Low-attenuation changes in the deep white matter consistent with small vessel ischemia. Vascular: No hyperdense vessel or unexpected calcification. Skull: The skull is intact. No fracture or focal lesion identified. Sinuses/Orbits: The visualized paranasal sinuses and mastoid air cells are clear. The orbits and globes intact. Other: None IMPRESSION: No acute intracranial abnormality. Findings consistent with age related atrophy and chronic small vessel ischemia Electronically Signed   By: Prudencio Pair M.D.   On: 10/27/2018 13:03   Dg Hip Unilat W Or Wo Pelvis 2-3 Views Left  Result Date: 10/27/2018 CLINICAL DATA:  Unwitnessed fall EXAM: DG HIP (WITH OR WITHOUT PELVIS) 2-3V LEFT COMPARISON:  February 04, 2014. FINDINGS: Comminuted fracture of the left hip through the proximal femur involving the greater and lesser trochanter is seen. There is overlap of fracture fragments. There is mild displacement of the lesser trochanter. The femoral head still appears to be well seated within the acetabulum. There is diffuse osteopenia. Limited visualization of the left inferior pubic rami. Degenerative changes in the lower lumbar spine. IMPRESSION: Comminuted proximal left femoral fracture involving the greater and lesser trochanter. Electronically Signed   By: Prudencio Pair M.D.   On: 10/27/2018 12:11    Assessment and Plan:   Elevated troponin: Patient has no symptoms to suggest unstable  cardiac disease.  Troponin checked in the ED for unclear reasons and found to be 64.  In the setting of fall with acute kidney injury and rhabdomyolysis, isolated high-sensitivity troponin I of 64 is of uncertain significance.  I do not believe this represents acute coronary syndrome.  However, I recommend repeating troponin to ensure that it has not gone up precipitously.  I also recommend obtaining a transthoracic echocardiogram, given the patient's cardiac risk factors and nonspecific EKG abnormalities.  If EKG does not show obvious wall motion abnormality/cardiomyopathy, I would not recommend any additional ischemia testing at this time.  Preoperative cardiovascular risk assessment: Patient scheduled for surgical fixation of left comminuted femur fracture.  She does not exhibit signs or symptoms of unstable cardiac disease, though her functional capacity is limited at baseline.  As above, slightly abnormal high-sensitivity troponin is of uncertain clinical significance.  I recommend repeating high-sensitivity troponin and obtain an echocardiogram, as above.  Assuming troponin does not rise precipitously and echocardiogram does not reveal any significant structural abnormalities, I think it would be reasonable to proceed with surgical intervention as soon as possible without additional cardiac testing/intervention, as these would not further mitigate her perioperative risk.  Acute kidney injury: Likely secondary to dehydration and rhabdomyolysis.  Patient appears euvolemic to dry on exam.  Continue IV hydration per internal medicine.  Avoid  nephrotoxic drugs.  Left femur fracture:  Per orthopedics and internal medicine.  For questions or updates, please contact [place] Please consult [email or web address] for contact info under [facility].  Signed, Nelva Bush, MD  10/27/2018 4:53 PM

## 2018-10-27 NOTE — TOC Progression Note (Signed)
Transition of Care [facility]) - Progression Note    Patient Details  Name: [name] MRN: [id number] Date of Birth: 25-Sep-1926  Transition of Care [facility]) CM/SW Contact  Su Hilt, RN Phone Number: 10/27/2018, 3:43 PM  Clinical Narrative:    Bevely Palmer APS is in the room at this time Arbie Cookey is the case worker in the room        Expected Discharge Plan and Services                                                 Social Determinants of Health ([place]) Interventions    Readmission Risk Interventions No flowsheet data found.

## 2018-10-27 NOTE — ED Notes (Signed)
ED TO INPATIENT HANDOFF REPORT  ED Nurse Name and Phone #: Metta Clines [phone]  S Name/Age/Gender Debe Coder 85 y.o. female Room/Bed: ED14A/ED14A  Code Status   Code Status: Not on file  Home/SNF/Other Home Patient oriented to: self, place, time and situation Is this baseline? Yes   Triage Complete: Triage complete  Chief Complaint fall  Triage Note PT arrives via ems from home. Pt fell Monday and laid on floor for unknown time until son arrived yesterday. Son helped pt to recliner where pt has been since. [employer] [facility] called pt today and pt informed them she could not get up from recliner and they called ambulance. Pt reports left pain. Shortening observed to left leg. PT lives by herself with regular visits from son per pt report    Allergies Allergies  Allergen Reactions  . Amlodipine Other (See Comments)    Leg edema   . Atenolol Other (See Comments)    REACTION: bradycardia  . Erythromycin Base Other (See Comments)    REACTION: stomach upset  . Lisinopril Other (See Comments)    REACTION: cough  (20 mg)    Level of Care/Admitting Diagnosis ED Disposition    ED Disposition Condition Comment   Admit  The patient appears reasonably stabilized for admission considering the current resources, flow, and capabilities available in the ED at this time, and I doubt any other [facility] requiring further screening and/or treatment in the ED prior to admission is  present.       B Medical/Surgery History Past Medical History:  Diagnosis Date  . Anxiety 12/2002  . Arthritis    hands  . Cancer ([place])    breast left arm  . Cancer ([place])    uterine  . Diabetes mellitus 11/2004   Type 2  . Dyspnea    WITH EXERTION  . GERD (gastroesophageal reflux disease)   . Hyperlipidemia 11/2004  . Hypertension 12/2002  . Hypothyroidism   . Osteopenia 02/2002   02/2002 Dexa osteopenia// Dexa 02/26/2004 (Dr. Ubaldo Glassing) stable/IMR femur/spine  . Spinal stenosis   . 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 Ronnald Collum endocrinologist  . Vertigo    Past Surgical History:  Procedure Laterality Date  . ABDOMINAL HYSTERECTOMY  1990   endometrial adenoca  . BREAST SURGERY  1980   left breast lumpectomy //abscess of breast evac.  Marland Kitchen CATARACT EXTRACTION W/PHACO Left 12/11/2016   Procedure: CATARACT EXTRACTION PHACO AND INTRAOCULAR LENS PLACEMENT (IOC);  Surgeon: Eulogio Bear, MD;  Location: [facility] ORS;  Service: Ophthalmology;  Laterality: Left;  [place] 1:18.4 AP 36.6% CDE 16.13  . CATARACT EXTRACTION W/PHACO Right 01/01/2017   Procedure: CATARACT EXTRACTION PHACO AND INTRAOCULAR LENS PLACEMENT (IOC);  Surgeon: Eulogio Bear, MD;  Location: [facility] ORS;  Service: Ophthalmology;  Laterality: Right;  [place]: 01:45.5 AP% 16.6 CDE 18.21 Fluid pack lot # [id number]  . CHOLECYSTECTOMY  07/10/1987  . EYE SURGERY     Tear Duct surgery, b/l cataract 2019   . MASTECTOMY Left      A IV Location/Drains/Wounds Patient Lines/Drains/Airways Status   Active Line/Drains/Airways    Name:   Placement date:   Placement time:   Site:   Days:   Peripheral IV 10/27/18 Right Antecubital   10/27/18    1114    Antecubital   less than 1   Incision (Closed) 12/11/16 Eye Left   12/11/16    0900     685   Incision (Closed) 01/01/17  Eye Right   01/01/17    1046     664          Intake/Output Last 24 hours No intake or output data in the 24 hours ending 10/27/18 1413  Labs/Imaging Results for orders placed or performed during the hospital encounter of 10/27/18 (from the past 48 hour(s))  CBC with Differential     Status: Abnormal   Collection Time: 10/27/18 11:15 AM  Result Value Ref Range   WBC 18.0 (H) 4.0 - 10.5 K/uL   RBC 3.55 (L) 3.87 - 5.11 MIL/uL   Hemoglobin 10.8 (L) 12.0 - 15.0 g/dL   HCT 32.9 (L) 36.0 - 46.0 %   MCV 92.7 80.0 - 100.0 fL   MCH 30.4 26.0 - 34.0 pg   MCHC 32.8 30.0 - 36.0 g/dL   RDW 14.6 11.5 - 15.5 %   Platelets 297 150 - 400 K/uL   nRBC  0.0 0.0 - 0.2 %   Neutrophils Relative % 85 %   Neutro Abs 15.4 (H) 1.7 - 7.7 K/uL   Lymphocytes Relative 5 %   Lymphs Abs 0.8 0.7 - 4.0 K/uL   Monocytes Relative 8 %   Monocytes Absolute 1.4 (H) 0.1 - 1.0 K/uL   Eosinophils Relative 0 %   Eosinophils Absolute 0.0 0.0 - 0.5 K/uL   Basophils Relative 0 %   Basophils Absolute 0.0 0.0 - 0.1 K/uL   WBC Morphology MORPHOLOGY UNREMARKABLE    RBC Morphology MORPHOLOGY UNREMARKABLE    Smear Review Normal platelet morphology    Immature Granulocytes 2 %   Abs Immature Granulocytes 0.36 (H) 0.00 - 0.07 K/uL    Comment: Performed at [facility], [address] [place], [place] [address]  Comprehensive metabolic panel     Status: Abnormal   Collection Time: 10/27/18 11:15 AM  Result Value Ref Range   Sodium 138 135 - 145 mmol/L   Potassium 3.6 3.5 - 5.1 mmol/L   Chloride 102 98 - 111 mmol/L   CO2 25 22 - 32 mmol/L   Glucose, Bld 149 (H) 70 - 99 mg/dL   BUN 45 (H) 8 - 23 mg/dL   Creatinine, Ser 2.11 (H) 0.44 - 1.00 mg/dL   Calcium 9.1 8.9 - 10.3 mg/dL   Total Protein 7.0 6.5 - 8.1 g/dL   Albumin 3.8 3.5 - 5.0 g/dL   AST 72 (H) 15 - 41 U/L   ALT 37 0 - 44 U/L   Alkaline Phosphatase 58 38 - 126 U/L   Total Bilirubin 1.4 (H) 0.3 - 1.2 mg/dL   GFR calc non Af Amer 20 (L) >60 mL/min   GFR calc Af Amer 23 (L) >60 mL/min   Anion gap 11 5 - 15    Comment: Performed at [facility], [place], [place] [address]  Troponin I (High Sensitivity)     Status: Abnormal   Collection Time: 10/27/18 11:15 AM  Result Value Ref Range   Troponin I (High Sensitivity) 64 (H) <18 ng/L    Comment: (NOTE) Elevated high sensitivity troponin I (hsTnI) values and significant  changes across serial measurements may suggest ACS but many other  chronic and acute conditions are known to elevate hsTnI results.  Refer to the "Links" section for chest pain algorithms and additional  guidance. Performed at [facility], [address] [place], [place] [address]   CK     Status: Abnormal   Collection Time: 10/27/18 11:15 AM  Result Value Ref Range   Total CK 3,126 (H) 38 - 234 U/L    Comment: Performed at [facility], [address] [place], [place] [address]  SARS Coronavirus 2 [facility] order, Performed in [facility] hospital lab) Nasopharyngeal Nasopharyngeal Swab     Status: None   Collection Time: 10/27/18 12:16 PM   Specimen: Nasopharyngeal Swab  Result Value Ref Range   SARS Coronavirus 2 NEGATIVE NEGATIVE    Comment: (NOTE) If result is NEGATIVE SARS-CoV-2 target nucleic acids are NOT DETECTED. The SARS-CoV-2 RNA is generally detectable in upper and lower  respiratory specimens during the acute phase of infection. The lowest  concentration of SARS-CoV-2 viral copies this assay can detect is 250  copies / mL. A negative result does not preclude SARS-CoV-2 infection  and should not be used as the sole basis for treatment or other  patient management decisions.  A negative result may occur with  improper specimen collection / handling, submission of specimen other  than nasopharyngeal swab, presence of viral mutation(s) within the  areas targeted by this assay, and inadequate number of viral copies  (<250 copies / mL). A negative result must be combined with clinical  observations, patient history, and epidemiological information. If result is POSITIVE SARS-CoV-2 target nucleic acids are DETECTED. The SARS-CoV-2 RNA is generally detectable in upper and lower  respiratory specimens dur ing the acute phase of infection.  Positive  results are indicative of active infection with SARS-CoV-2.  Clinical  correlation with patient history and other diagnostic information is  necessary to determine patient infection status.  Positive results do  not rule out bacterial infection or co-infection with other viruses. If result is PRESUMPTIVE POSTIVE SARS-CoV-2 nucleic acids MAY BE PRESENT.   A  presumptive positive result was obtained on the submitted specimen  and confirmed on repeat testing.  While 2019 novel coronavirus  (SARS-CoV-2) nucleic acids may be present in the submitted sample  additional confirmatory testing may be necessary for epidemiological  and / or clinical management purposes  to differentiate between  SARS-CoV-2 and other Sarbecovirus currently known to infect humans.  If clinically indicated additional testing with an alternate test  methodology [phone]) is advised. The SARS-CoV-2 RNA is generally  detectable in upper and lower respiratory sp ecimens during the acute  phase of infection. The expected result is Negative. Fact Sheet for Patients:  StrictlyIdeas.no Fact Sheet for Healthcare Providers: BankingDealers.co.za This test is not yet approved or cleared by the [place] FDA and has been authorized for detection and/or diagnosis of SARS-CoV-2 by FDA under an Emergency Use Authorization (EUA).  This EUA will remain in effect (meaning this test can be used) for the duration of the COVID-19 declaration under Section 564(b)(1) of the Act, 21 U.S.C. section 360bbb-3(b)(1), unless the authorization is terminated or revoked sooner. Performed at [facility], [address], [place], [place] [address]    Dg Chest 1 View  Result Date: 10/27/2018 CLINICAL DATA:  85 year old female with fall. EXAM: CHEST  1 VIEW COMPARISON:  Chest radiograph dated 02/04/2014 FINDINGS: The lungs are clear. There is no pleural effusion or pneumothorax. The cardiac silhouette is within normal limits. Atherosclerotic calcification of the tortuous aorta. There is osteopenia with multilevel degenerative changes of the spine and shoulder. Elevated right shoulder, likely related to chronic rotator cuff injury. No acute osseous pathology. IMPRESSION: 1. No active disease. 2. Osseous structures are without acute abnormality.  Electronically Signed   By: Laren Everts.D.  On: 10/27/2018 12:12   Ct Head Wo Contrast  Result Date: 10/27/2018 CLINICAL DATA:  Patient had a fall on Monday EXAM: CT HEAD WITHOUT CONTRAST TECHNIQUE: Contiguous axial images were obtained from the base of the skull through the vertex without intravenous contrast. COMPARISON:  Multiple priors, most recent February 04, 2014. FINDINGS: Brain: No evidence of acute territorial infarction, hemorrhage, hydrocephalus,extra-axial collection or mass lesion/mass effect. There is dilatation the ventricles and sulci consistent with age-related atrophy. Low-attenuation changes in the deep white matter consistent with small vessel ischemia. Vascular: No hyperdense vessel or unexpected calcification. Skull: The skull is intact. No fracture or focal lesion identified. Sinuses/Orbits: The visualized paranasal sinuses and mastoid air cells are clear. The orbits and globes intact. Other: None IMPRESSION: No acute intracranial abnormality. Findings consistent with age related atrophy and chronic small vessel ischemia Electronically Signed   By: Prudencio Pair M.D.   On: 10/27/2018 13:03   Dg Hip Unilat W Or Wo Pelvis 2-3 Views Left  Result Date: 10/27/2018 CLINICAL DATA:  Unwitnessed fall EXAM: DG HIP (WITH OR WITHOUT PELVIS) 2-3V LEFT COMPARISON:  February 04, 2014. FINDINGS: Comminuted fracture of the left hip through the proximal femur involving the greater and lesser trochanter is seen. There is overlap of fracture fragments. There is mild displacement of the lesser trochanter. The femoral head still appears to be well seated within the acetabulum. There is diffuse osteopenia. Limited visualization of the left inferior pubic rami. Degenerative changes in the lower lumbar spine. IMPRESSION: Comminuted proximal left femoral fracture involving the greater and lesser trochanter. Electronically Signed   By: Prudencio Pair M.D.   On: 10/27/2018 12:11    Pending  Labs Unresulted Labs (From admission, onward)   None      Vitals/Pain Today's Vitals   10/27/18 1330 10/27/18 1345 10/27/18 1353 10/27/18 1400  BP:   (!) 148/65 (!) 155/62  Pulse: 65 77  64  Resp: 16 19  17   Temp:      TempSrc:      SpO2: 100% 100%  99%  Weight:      Height:      PainSc:        Isolation Precautions Airborne and Contact precautions  Medications Medications  0.9 %  sodium chloride infusion ( Intravenous New Bag/Given 10/27/18 1244)    Mobility Usually walks with device as needed Moderate fall risk   Focused Assessments Comminuted proximal left femoral fracture involving the greater and lesser trochanter. L leg slightly short than R currently; dorsalis pedis pulses 2+ bilat; appropriate color/warmth/cap refill.    R Recommendations: See Admitting Provider Note  Report given to:   Additional Notes:  R ac 20g IV

## 2018-10-27 NOTE — Progress Notes (Signed)
Notified MD regarding HR and Labs, awaiting response.

## 2018-10-27 NOTE — ED Notes (Signed)
Pt resting calmly in bed. Denies any needs currently. Bed locked low. Rails up. Call bell within reach. Pt has warm blankets.

## 2018-10-27 NOTE — ED Triage Notes (Addendum)
PT arrives via ems from home. Pt fell Monday and laid on floor for unknown time until son arrived yesterday. Son helped pt to recliner where pt has been since. [employer] called pt today and pt informed them she could not get up from recliner and they called ambulance. Pt reports left pain. Shortening observed to left leg. PT lives by herself with regular visits from son per pt report

## 2018-10-27 NOTE — ED Notes (Signed)
Pink arm band applied to L wrist as pt says history of mastectomy.

## 2018-10-27 NOTE — ED Notes (Signed)
Pt given more warm blankets; mouth moisturizer; family member given cup of iced water.

## 2018-10-27 NOTE — ED Notes (Signed)
Verbal decline from [facility] for 2nd trop.

## 2018-10-27 NOTE — TOC Initial Note (Signed)
Transition of Care [facility]) - Initial/Assessment Note    Patient Details  Name: [name] MRN: [id number] Date of Birth: Jul 19, 1926  [facility] CM/SW Contact:    Su Hilt, RN Phone Number: 10/27/2018, 4:04 PM  Clinical Narrative:                 Met with the patient in the room, [facility] APS worker Arbie Cookey [phone] was in the room with the patient.  The patient stated that she fell and could not get up.  Her son helped her to the recliner and she lay there until a friend came the next day.   She does not have anyone checking on her daily.  Her son lives across the driveway from her.  He now has her dog caring for it. She gave Her grand daughter [name] number to be [phone]  She is open to the idea of going to rehab and would like to go to [facility] I explained that after surgery I would do a bed search and see if they have a bed. Her surgery is potentially tomorrow I called Magda Paganini at [facility] to see if they had a bed and give a heads up that the patient would like to go to [facility], she stated that they do have a bed however they are not doing weekend admissions and could only take her no earlier than Monday   Expected Discharge Plan: Skilled Nursing Facility Barriers to Discharge: Continued Medical Work up   Patient Goals and CMS Choice Patient states their goals for this hospitalization and ongoing recovery are:: get better      Expected Discharge Plan and Services Expected Discharge Plan: [place]       Living arrangements for the past 2 months: Single Family Home                                      Prior Living Arrangements/Services Living arrangements for the past 2 months: Single Family Home Lives with:: Self Patient language and need for interpreter reviewed:: No Do you feel safe going back to the place where you live?: No   patient lives alone and fell at home, does not have  someone checking on her regularly  Need for Family Participation in Patient Care: No (Comment) Care giver support system in place?: No (comment)   Criminal Activity/Legal Involvement Pertinent to Current Situation/Hospitalization: No - Comment as needed  Activities of Daily Living      Permission Sought/Granted   Permission granted to share information with : Yes, Verbal Permission Granted              Emotional Assessment       Orientation: : Oriented to Self, Oriented to Place, Oriented to  Time, Oriented to Situation Alcohol / Substance Use: Not Applicable Psych Involvement: No (comment)  Admission diagnosis:  Dehydration [E86.0] Fall [W19.XXXA] AKI (acute kidney injury) ([place]) [N17.9] Closed fracture of left hip, initial encounter ([place]) [S72.002A] Fall, initial encounter [W19.XXXA] Patient Active Problem List   Diagnosis Date Noted  . Comminuted fracture of left hip ([place]) 10/27/2018  . Osteoarthritis 03/12/2018  . Overactive bladder 03/12/2018  . Carotid artery stenosis 01/08/2018  . Vitamin D deficiency 01/08/2018  . Poor balance 10/16/2017  . Fall at home 10/15/2017  . Buttock pain 10/15/2017  . Contusion, buttock 10/15/2017  . Macular degeneration 03/20/2017  .  Thickened nails 03/20/2017  . History of rectal bleeding 05/21/2016  . Eczema 05/16/2014  . Spinal stenosis of lumbar region 02/15/2014  . Right foot pain 09/23/2013  . Right shoulder pain 09/23/2013  . Other screening mammogram 04/12/2013  . Dizziness 05/21/2012  . Falls frequently 05/21/2012  . Urge incontinence 12/26/2011  . Hip pain, bilateral 09/15/2011  . Special screening for malignant neoplasms, colon 07/10/2010  . Hypothyroidism 04/30/2010  . CONSTIPATION 04/30/2010  . STRESS REACTION, ACUTE, WITH EMOTIONAL DISTURBANCE 10/09/2009  . Prediabetes 07/22/2009  . Malignant neoplasm of female breast ([place]) 12/29/2007  . Spinal stenosis, lumbar 06/22/2006  . PLUMMER'S DISEASE 06/16/2006  .  Hyperlipidemia 06/16/2006  . ANXIETY 06/16/2006  . Essential hypertension 06/16/2006  . Osteopenia 06/16/2006   PCP:  McLean-Scocuzza, Nino Glow, MD Pharmacy:   [place], [address] [place] [address] Phone: [phone] Fax: [phone]     Social Determinants of Health (SDOH) Interventions    Readmission Risk Interventions No flowsheet data found.

## 2018-10-27 NOTE — Progress Notes (Signed)
[name] at [facility]                                                                                                                                                                                  Patient Demographics   [name], is a 85 y.o. female, DOB - December 22, 1926, MEQ:[id number]  Admit date - 10/27/2018   Admitting Physician Dustin Flock, MD  Outpatient Primary MD for the patient is McLean-Scocuzza, Nino Glow, MD   LOS - 0  Subjective: Called by nurse due to patient heart rate continue to trend up.  Heart rate in the 150s noted to be in A. fib with RVR.    Review of Systems:   CONSTITUTIONAL: Unable to provide patient currently tearful and in pain  Vitals:   Vitals:   10/27/18 1445 10/27/18 1643 10/27/18 1831 10/27/18 1927  BP:  (!) 141/71 118/68 134/74  Pulse: 76 (!) 122 (!) 150   Resp: 16 16    Temp:  98.7 F (37.1 C)  98.3 F (36.8 C)  TempSrc:  Oral  Oral  SpO2: 99% 97% 95%   Weight:    52.7 kg  Height:    5\' 3"  (1.6 m)    Wt Readings from Last 3 Encounters:  10/27/18 52.7 kg  03/12/18 64.4 kg  01/08/18 62.9 kg     Intake/Output Summary (Last 24 hours) at 10/27/2018 1936 Last data filed at 10/27/2018 1800 Gross per 24 hour  Intake 66.85 ml  Output -  Net 66.85 ml    Physical Exam:   GENERAL: Pleasant-appearing in no apparent distress.  HEAD, EYES, EARS, NOSE AND THROAT: Atraumatic, normocephalic.  Pupils equal and reactive to light. Sclerae anicteric. No conjunctival injection. No oro-pharyngeal erythema.  NECK: Supple. There is no jugular venous distention. No bruits, no lymphadenopathy, no thyromegaly.  HEART: Irregularly irregular. No murmurs, no rubs, no clicks.  LUNGS: Clear to auscultation bilaterally. No rales or rhonchi. No wheezes.  ABDOMEN: Soft, flat, nontender, nondistended. Has good bowel sounds. No hepatosplenomegaly appreciated.  EXTREMITIES: No evidence of any cyanosis, clubbing, or peripheral edema.   +2 pedal and radial pulses bilaterally.  NEUROLOGIC: The patient is alert, SKIN: Moist and warm with no rashes appreciated.  Psych: Not anxious, depressed LN: No inguinal LN enlargement    Antibiotics   Anti-infectives (From admission, onward)   None      Medications   Scheduled Meds: . [START ON 10/28/2018] Chlorhexidine Gluconate Cloth  6 each Topical Q0600  . gabapentin  100 mg Oral BID  . [START ON 10/28/2018] levothyroxine  50 mcg Oral Q0600  . metoprolol tartrate  25 mg Oral BID  Continuous Infusions: . sodium chloride 100 mL/hr at 10/27/18 1800  . diltiazem (CARDIZEM) infusion     PRN Meds:.acetaminophen **OR** acetaminophen, HYDROcodone-acetaminophen, morphine injection, ondansetron **OR** ondansetron (ZOFRAN) IV   Data Review:   Micro Results Recent Results (from the past 240 hour(s))  SARS Coronavirus 2 [facility] order, Performed in [facility] hospital lab) Nasopharyngeal Nasopharyngeal Swab     Status: None   Collection Time: 10/27/18 12:16 PM   Specimen: Nasopharyngeal Swab  Result Value Ref Range Status   SARS Coronavirus 2 NEGATIVE NEGATIVE Final    Comment: (NOTE) If result is NEGATIVE SARS-CoV-2 target nucleic acids are NOT DETECTED. The SARS-CoV-2 RNA is generally detectable in upper and lower  respiratory specimens during the acute phase of infection. The lowest  concentration of SARS-CoV-2 viral copies this assay can detect is 250  copies / mL. A negative result does not preclude SARS-CoV-2 infection  and should not be used as the sole basis for treatment or other  patient management decisions.  A negative result may occur with  improper specimen collection / handling, submission of specimen other  than nasopharyngeal swab, presence of viral mutation(s) within the  areas targeted by this assay, and inadequate number of viral copies  (<250 copies / mL). A negative result must be combined with clinical  observations, patient history, and  epidemiological information. If result is POSITIVE SARS-CoV-2 target nucleic acids are DETECTED. The SARS-CoV-2 RNA is generally detectable in upper and lower  respiratory specimens dur ing the acute phase of infection.  Positive  results are indicative of active infection with SARS-CoV-2.  Clinical  correlation with patient history and other diagnostic information is  necessary to determine patient infection status.  Positive results do  not rule out bacterial infection or co-infection with other viruses. If result is PRESUMPTIVE POSTIVE SARS-CoV-2 nucleic acids MAY BE PRESENT.   A presumptive positive result was obtained on the submitted specimen  and confirmed on repeat testing.  While 2019 novel coronavirus  (SARS-CoV-2) nucleic acids may be present in the submitted sample  additional confirmatory testing may be necessary for epidemiological  and / or clinical management purposes  to differentiate between  SARS-CoV-2 and other Sarbecovirus currently known to infect humans.  If clinically indicated additional testing with an alternate test  methodology [phone]) is advised. The SARS-CoV-2 RNA is generally  detectable in upper and lower respiratory sp ecimens during the acute  phase of infection. The expected result is Negative. Fact Sheet for Patients:  StrictlyIdeas.no Fact Sheet for Healthcare Providers: BankingDealers.co.za This test is not yet approved or cleared by the [place] FDA and has been authorized for detection and/or diagnosis of SARS-CoV-2 by FDA under an Emergency Use Authorization (EUA).  This EUA will remain in effect (meaning this test can be used) for the duration of the COVID-19 declaration under Section 564(b)(1) of the Act, 21 U.S.C. section 360bbb-3(b)(1), unless the authorization is terminated or revoked sooner. Performed at [facility], [address] [place], [place] [address]      Radiology Reports Dg Chest 1 View  Result Date: 10/27/2018 CLINICAL DATA:  85 year old female with fall. EXAM: CHEST  1 VIEW COMPARISON:  Chest radiograph dated 02/04/2014 FINDINGS: The lungs are clear. There is no pleural effusion or pneumothorax. The cardiac silhouette is within normal limits. Atherosclerotic calcification of the tortuous aorta. There is osteopenia with multilevel degenerative changes of the spine and shoulder. Elevated right shoulder, likely related to chronic rotator cuff injury. No acute osseous pathology.  IMPRESSION: 1. No active disease. 2. Osseous structures are without acute abnormality. Electronically Signed   By: Anner Crete M.D.   On: 10/27/2018 12:12   Ct Head Wo Contrast  Result Date: 10/27/2018 CLINICAL DATA:  Patient had a fall on Monday EXAM: CT HEAD WITHOUT CONTRAST TECHNIQUE: Contiguous axial images were obtained from the base of the skull through the vertex without intravenous contrast. COMPARISON:  Multiple priors, most recent February 04, 2014. FINDINGS: Brain: No evidence of acute territorial infarction, hemorrhage, hydrocephalus,extra-axial collection or mass lesion/mass effect. There is dilatation the ventricles and sulci consistent with age-related atrophy. Low-attenuation changes in the deep white matter consistent with small vessel ischemia. Vascular: No hyperdense vessel or unexpected calcification. Skull: The skull is intact. No fracture or focal lesion identified. Sinuses/Orbits: The visualized paranasal sinuses and mastoid air cells are clear. The orbits and globes intact. Other: None IMPRESSION: No acute intracranial abnormality. Findings consistent with age related atrophy and chronic small vessel ischemia Electronically Signed   By: Prudencio Pair M.D.   On: 10/27/2018 13:03   Dg Hip Unilat W Or Wo Pelvis 2-3 Views Left  Result Date: 10/27/2018 CLINICAL DATA:  Unwitnessed fall EXAM: DG HIP (WITH OR WITHOUT PELVIS) 2-3V LEFT COMPARISON:  February 04, 2014. FINDINGS: Comminuted fracture of the left hip through the proximal femur involving the greater and lesser trochanter is seen. There is overlap of fracture fragments. There is mild displacement of the lesser trochanter. The femoral head still appears to be well seated within the acetabulum. There is diffuse osteopenia. Limited visualization of the left inferior pubic rami. Degenerative changes in the lower lumbar spine. IMPRESSION: Comminuted proximal left femoral fracture involving the greater and lesser trochanter. Electronically Signed   By: Prudencio Pair M.D.   On: 10/27/2018 12:11     CBC Recent Labs  Lab 10/27/18 1115  WBC 18.0*  HGB 10.8*  HCT 32.9*  PLT 297  MCV 92.7  MCH 30.4  MCHC 32.8  RDW 14.6  LYMPHSABS 0.8  MONOABS 1.4*  EOSABS 0.0  BASOSABS 0.0    Chemistries  Recent Labs  Lab 10/27/18 1115  NA 138  K 3.6  CL 102  CO2 25  GLUCOSE 149*  BUN 45*  CREATININE 2.11*  CALCIUM 9.1  AST 72*  ALT 37  ALKPHOS 58  BILITOT 1.4*   ------------------------------------------------------------------------------------------------------------------ estimated creatinine clearance is 15 mL/min (A) (by C-G formula based on SCr of 2.11 mg/dL (H)). ------------------------------------------------------------------------------------------------------------------ Recent Labs    10/27/18 1115  HGBA1C 5.7*   ------------------------------------------------------------------------------------------------------------------ No results for input(s): CHOL, HDL, LDLCALC, TRIG, CHOLHDL, LDLDIRECT in the last 72 hours. ------------------------------------------------------------------------------------------------------------------ No results for input(s): TSH, T4TOTAL, T3FREE, THYROIDAB in the last 72 hours.  Invalid input(s): FREET3 ------------------------------------------------------------------------------------------------------------------ No results for input(s):  VITAMINB12, FOLATE, FERRITIN, TIBC, IRON, RETICCTPCT in the last 72 hours.  Coagulation profile No results for input(s): INR, PROTIME in the last 168 hours.  No results for input(s): DDIMER in the last 72 hours.  Cardiac Enzymes No results for input(s): CKMB, TROPONINI, MYOGLOBIN in the last 168 hours.  Invalid input(s): CK ------------------------------------------------------------------------------------------------------------------ Invalid input(s): [place]   Patient 85 year old admitted earlier with hip fracture now noted to be in A. fib with RVR  1.  A. fib with RVR I have started patient on IV Cardizem drip.  Also give her low-dose digoxin to help slow down her heart rate. Echocardiogram will be done tomorrow morning. Will need better heart rate control prior to any  surgery. Cardiology already following the patient. Due to planned surgery no anticoagulation for now.      Code Status Orders  (From admission, onward)         Start     Ordered   10/27/18 1659  Full code  Continuous     10/27/18 1658        Code Status History    This patient has a current code status but no historical code status.   Advance Care Planning Activity    Advance Directive Documentation     Most Recent Value  Type of Advance Directive  Healthcare Power of Attorney  Pre-existing out of facility DNR order (yellow form or pink MOST form)  -  "MOST" Form in Place?  -           Consults cardiology and orthopedics  DVT Prophylaxis SCDs Lab Results  Component Value Date   PLT 297 10/27/2018     Time Spent in minutes additional 35 minutes spent  Greater than 50% of time spent in care coordination and counseling patient regarding the condition and plan of care.   Dustin Flock M.D on 10/27/2018 at 7:36 PM  Between 7am to 6pm - Pager - [phone]  After 6pm go to [email or web address] - Proofreader  [employer]   Office  [phone]

## 2018-10-27 NOTE — H&P (Addendum)
[place] [facility] [place] NAME: [name]    MR#:  [id number]  DATE OF BIRTH:  1927-02-24  DATE OF ADMISSION:  10/27/2018  PRIMARY CARE PHYSICIAN: McLean-Scocuzza, Nino Glow, MD   REQUESTING/REFERRING PHYSICIAN: Nance Pear, MD  CHIEF COMPLAINT:   Chief Complaint  Patient presents with  . Fall    HISTORY OF PRESENT ILLNESS: [name]  is a 85 y.o. female with a known history of essential hypertension, hypothyroidism and anxiety who resides by herself.  Who 2 days ago was trying to lock her door from inside and fell backwards.  Patient stayed on the ground overnight.  Then her son came and put her in a recliner yesterday but her friend came to check on her she continued to have significant pain therefore EMS was called.  Patient noted to have a left-sided hip fracture she also is noted to have rhabdo. PAST MEDICAL HISTORY:   Past Medical History:  Diagnosis Date  . Anxiety 12/2002  . Arthritis    hands  . Cancer ([place])    breast left arm  . Cancer ([place])    uterine  . Diabetes mellitus 11/2004   Type 2  . Dyspnea    WITH EXERTION  . GERD (gastroesophageal reflux disease)   . Hyperlipidemia 11/2004  . Hypertension 12/2002  . Hypothyroidism   . Osteopenia 02/2002   02/2002 Dexa osteopenia// Dexa 02/26/2004 (Dr. Ubaldo Glassing) stable/IMR femur/spine  . Spinal stenosis   . 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 Ronnald Collum endocrinologist  . Vertigo     PAST SURGICAL HISTORY:  Past Surgical History:  Procedure Laterality Date  . ABDOMINAL HYSTERECTOMY  1990   endometrial adenoca  . BREAST SURGERY  1980   left breast lumpectomy //abscess of breast evac.  Marland Kitchen CATARACT EXTRACTION W/PHACO Left 12/11/2016   Procedure: CATARACT EXTRACTION PHACO AND INTRAOCULAR LENS PLACEMENT (IOC);  Surgeon: Eulogio Bear, MD;  Location: [facility] ORS;  Service: Ophthalmology;  Laterality: Left;  [place] 1:18.4 AP 36.6% CDE 16.13  .  CATARACT EXTRACTION W/PHACO Right 01/01/2017   Procedure: CATARACT EXTRACTION PHACO AND INTRAOCULAR LENS PLACEMENT (IOC);  Surgeon: Eulogio Bear, MD;  Location: [facility] ORS;  Service: Ophthalmology;  Laterality: Right;  [place]: 01:45.5 AP% 16.6 CDE 18.21 Fluid pack lot # [id number]  . CHOLECYSTECTOMY  07/10/1987  . EYE SURGERY     Tear Duct surgery, b/l cataract 2019   . MASTECTOMY Left     SOCIAL HISTORY:  Social History   Tobacco Use  . Smoking status: Never Smoker  . Smokeless tobacco: Never Used  Substance Use Topics  . Alcohol use: No    Alcohol/week: 0.0 standard drinks    FAMILY HISTORY:  Family History  Problem Relation Age of Onset  . Alzheimer's disease Mother   . Heart disease Mother 31       angina  . Hypertension Mother   . Crohn's disease Mother        ? crohns disease  . Cancer Father 74       colon cancer  . Cancer Sister 9       breast cancer   . COPD Sister        emphysema (smoker)  . Heart disease Sister        angina  . Alcohol abuse Brother   . Diabetes Paternal Aunt   . Hypertension Sister   . Stroke Neg Hx     DRUG  ALLERGIES:  Allergies  Allergen Reactions  . Amlodipine Other (See Comments)    Leg edema   . Atenolol Other (See Comments)    REACTION: bradycardia  . Erythromycin Base Other (See Comments)    REACTION: stomach upset  . Lisinopril Other (See Comments)    REACTION: cough  (20 mg)    REVIEW OF SYSTEMS:   CONSTITUTIONAL: No fever, fatigue or weakness.  EYES: No blurred or double vision.  EARS, NOSE, AND THROAT: No tinnitus or ear pain.  RESPIRATORY: No cough, shortness of breath, wheezing or hemoptysis.  CARDIOVASCULAR: No chest pain, orthopnea, edema.  GASTROINTESTINAL: No nausea, vomiting, diarrhea or abdominal pain.  GENITOURINARY: No dysuria, hematuria.  ENDOCRINE: No polyuria, nocturia,  HEMATOLOGY: No anemia, easy bruising or bleeding SKIN: No rash or lesion. MUSCULOSKELETAL: Positive left hip pain NEUROLOGIC:  No tingling, numbness, weakness.  PSYCHIATRY: No anxiety or depression.   MEDICATIONS AT HOME:  Prior to Admission medications   Medication Sig Start Date End Date Taking? Authorizing Provider  aspirin 81 MG tablet Take 81 mg by mouth daily. Daily with food    Yes [provider]  Cholecalciferol 1.25 MG (50000 UT) capsule Take 1 capsule (50,000 Units total) by mouth once a week. 03/12/18  Yes McLean-Scocuzza, Nino Glow, MD  gabapentin (NEURONTIN) 100 MG capsule TAKE ONE (1) CAPSULE BY MOUTH 2 TIMES DAILY 03/12/18  Yes McLean-Scocuzza, Nino Glow, MD  levothyroxine (SYNTHROID, LEVOTHROID) 50 MCG tablet Take 1 tablet (50 mcg total) by mouth daily. 03/12/18  Yes McLean-Scocuzza, Nino Glow, MD  losartan-hydrochlorothiazide (HYZAAR) 100-25 MG tablet Take 1 tablet by mouth daily. 03/12/18  Yes McLean-Scocuzza, Nino Glow, MD  vitamin C (ASCORBIC ACID) 500 MG tablet Take 500 mg by mouth daily.   Yes [provider]  diclofenac sodium (VOLTAREN) 1 % GEL Apply 2 g topically 4 (four) times daily. Upper body and 4 grams lower body 03/12/18   McLean-Scocuzza, Nino Glow, MD  mometasone (ELOCON) 0.1 % cream Apply 1 application topically daily. To affected areas behind ears 03/26/17   Tower, Roque Lias A, MD  polyethylene glycol powder (GLYCOLAX/MIRALAX) powder Take 17 g by mouth daily as needed. As directed for constipation 03/26/17   Tower, Wynelle Fanny, MD      PHYSICAL EXAMINATION:   VITAL SIGNS: Blood pressure (!) 141/64, pulse 77, temperature 99.2 F (37.3 C), temperature source Oral, resp. rate 19, height 5\' 2"  (1.575 m), weight 63.5 kg, SpO2 100 %.  GENERAL:  85 y.o.-year-old patient frail looking lying in the bed with no acute distress.  EYES: Pupils equal, round, reactive to light and accommodation. No scleral icterus. Extraocular muscles intact.  HEENT: Head atraumatic, normocephalic. Oropharynx and nasopharynx clear.  NECK:  Supple, no jugular venous distention. No thyroid enlargement, no tenderness.   LUNGS: Normal breath sounds bilaterally, no wheezing, rales,rhonchi or crepitation. No use of accessory muscles of respiration.  CARDIOVASCULAR: S1, S2 normal. No murmurs, rubs, or gallops.  ABDOMEN: Soft, nontender, nondistended. Bowel sounds present. No organomegaly or mass.  EXTREMITIES: No pedal edema, cyanosis, or clubbing.  NEUROLOGIC: Cranial nerves II through XII are intact. Muscle strength 5/5 in all extremities. Sensation intact. Gait not checked.  PSYCHIATRIC: The patient is alert and oriented x 3.  SKIN: No obvious rash, lesion, or ulcer.   LABORATORY PANEL:   CBC Recent Labs  Lab 10/27/18 1115  WBC 18.0*  HGB 10.8*  HCT 32.9*  PLT 297  MCV 92.7  MCH 30.4  MCHC 32.8  RDW 14.6  LYMPHSABS  0.8  MONOABS 1.4*  EOSABS 0.0  BASOSABS 0.0   ------------------------------------------------------------------------------------------------------------------  Chemistries  Recent Labs  Lab 10/27/18 1115  NA 138  K 3.6  CL 102  CO2 25  GLUCOSE 149*  BUN 45*  CREATININE 2.11*  CALCIUM 9.1  AST 72*  ALT 37  ALKPHOS 58  BILITOT 1.4*   ------------------------------------------------------------------------------------------------------------------ estimated creatinine clearance is 15.8 mL/min (A) (by C-G formula based on SCr of 2.11 mg/dL (H)). ------------------------------------------------------------------------------------------------------------------ No results for input(s): TSH, T4TOTAL, T3FREE, THYROIDAB in the last 72 hours.  Invalid input(s): FREET3   Coagulation profile No results for input(s): INR, PROTIME in the last 168 hours. ------------------------------------------------------------------------------------------------------------------- No results for input(s): DDIMER in the last 72 hours. -------------------------------------------------------------------------------------------------------------------  Cardiac Enzymes No results for  input(s): CKMB, TROPONINI, MYOGLOBIN in the last 168 hours.  Invalid input(s): CK ------------------------------------------------------------------------------------------------------------------ Invalid input(s): POCBNP  ---------------------------------------------------------------------------------------------------------------  Urinalysis    Component Value Date/Time   COLORURINE YELLOW 03/12/2018 1116   APPEARANCEUR CLEAR 03/12/2018 1116   APPEARANCEUR Clear 02/04/2014 1652   LABSPEC 1.018 03/12/2018 1116   LABSPEC 1.014 02/04/2014 1652   LABSPEC 1.010 08/23/2008 1504   PHURINE 5.5 03/12/2018 1116   GLUCOSEU NEGATIVE 03/12/2018 1116   GLUCOSEU Negative 02/04/2014 1652   HGBUR NEGATIVE 03/12/2018 1116   BILIRUBINUR Negative 02/04/2014 1652   KETONESUR NEGATIVE 03/12/2018 1116   PROTEINUR NEGATIVE 03/12/2018 1116   UROBILINOGEN 0.2 12/26/2011 1229   UROBILINOGEN 1.0 10/10/2011 0034   NITRITE NEGATIVE 03/12/2018 1116   LEUKOCYTESUR 2+ (A) 03/12/2018 1116   LEUKOCYTESUR Trace 02/04/2014 1652     RADIOLOGY: Dg Chest 1 View  Result Date: 10/27/2018 CLINICAL DATA:  85 year old female with fall. EXAM: CHEST  1 VIEW COMPARISON:  Chest radiograph dated 02/04/2014 FINDINGS: The lungs are clear. There is no pleural effusion or pneumothorax. The cardiac silhouette is within normal limits. Atherosclerotic calcification of the tortuous aorta. There is osteopenia with multilevel degenerative changes of the spine and shoulder. Elevated right shoulder, likely related to chronic rotator cuff injury. No acute osseous pathology. IMPRESSION: 1. No active disease. 2. Osseous structures are without acute abnormality. Electronically Signed   By: Anner Crete M.D.   On: 10/27/2018 12:12   Ct Head Wo Contrast  Result Date: 10/27/2018 CLINICAL DATA:  Patient had a fall on Monday EXAM: CT HEAD WITHOUT CONTRAST TECHNIQUE: Contiguous axial images were obtained from the base of the skull through  the vertex without intravenous contrast. COMPARISON:  Multiple priors, most recent February 04, 2014. FINDINGS: Brain: No evidence of acute territorial infarction, hemorrhage, hydrocephalus,extra-axial collection or mass lesion/mass effect. There is dilatation the ventricles and sulci consistent with age-related atrophy. Low-attenuation changes in the deep white matter consistent with small vessel ischemia. Vascular: No hyperdense vessel or unexpected calcification. Skull: The skull is intact. No fracture or focal lesion identified. Sinuses/Orbits: The visualized paranasal sinuses and mastoid air cells are clear. The orbits and globes intact. Other: None IMPRESSION: No acute intracranial abnormality. Findings consistent with age related atrophy and chronic small vessel ischemia Electronically Signed   By: Prudencio Pair M.D.   On: 10/27/2018 13:03   Dg Hip Unilat W Or Wo Pelvis 2-3 Views Left  Result Date: 10/27/2018 CLINICAL DATA:  Unwitnessed fall EXAM: DG HIP (WITH OR WITHOUT PELVIS) 2-3V LEFT COMPARISON:  February 04, 2014. FINDINGS: Comminuted fracture of the left hip through the proximal femur involving the greater and lesser trochanter is seen. There is overlap of fracture fragments. There is mild displacement of the lesser trochanter. The femoral  head still appears to be well seated within the acetabulum. There is diffuse osteopenia. Limited visualization of the left inferior pubic rami. Degenerative changes in the lower lumbar spine. IMPRESSION: Comminuted proximal left femoral fracture involving the greater and lesser trochanter. Electronically Signed   By: Prudencio Pair M.D.   On: 10/27/2018 12:11    EKG: Orders placed or performed during the hospital encounter of 10/27/18  . ED EKG  . EKG 12-Lead  . EKG 12-Lead  . ED EKG    IMPRESSION AND PLAN: Patient is 85 year old with history of hypertension hypothyroidism status post fall  1.  Left comminuted hip fracture Orthopedic has been  consulted by the ED physician Pain control Patient has no cardiopulmonary symptoms no further cardiopulmonary work-up needed moderate risk of surgery   2.  Acute kidney injury this is due to dehydration we will give her IV fluids monitor her renal function, I will hold HCTZ and losartan   3.  Hypothyroidism continue Synthroid  4.  Elevated troponin this is due to rhabdo will monitor for any cardiac symptoms, recheck CPK in the  5.  Leukocytosis likely reactive we will check a urinalysis chest x-ray is negative  6.  Miscellaneous SCDs for DVT prophylaxis          All the records are reviewed and case discussed with ED provider. Management plans discussed with the patient, family and they are in agreement.  CODE STATUS:full code    TOTAL TIME TAKING CARE OF THIS PATIENT: 55 minutes.    Dustin Flock M.D on 10/27/2018 at 1:57 PM  Between 7am to 6pm - Pager - [phone]  After 6pm go to [email or web address] - password [email or web address] [facility] Office  [phone]  CC: Primary care physician; McLean-Scocuzza, Nino Glow, MD

## 2018-10-27 NOTE — ED Notes (Signed)
Patient transported to X-ray 

## 2018-10-27 NOTE — Progress Notes (Signed)
New orders were implemented. EKG obtained. Tele monitoring placed. Once placed rapid called for reading, elevated BP and 140-150s HR. Transferred to stepdown per order.

## 2018-10-27 NOTE — Care Plan (Signed)
Patient admitted to [facility] today 10/27/2022 comminuted left intertrochanteric hip fracture, AKI, rhabdomyolysis, elevated troponin.  Late in the evening she developed atrial fibrillation with RVR.  She was to be transferred to telemetry unit, however due to nursing staff shortage, she was transferred to the stepdown unit as a Telemetry level of Care.  Dr. Posey Pronto is at bedside evaluating potation.  Patient is awake and alert, with left hip pain, heart rate in the 130s atrial fib, otherwise vital signs stable and in no acute distress.  She has received IV digoxin push, is being placed on Cardizem infusion. Discussed with Dr. Posey Pronto, at this time he does not want to consult PCCM.  PCCM is available as needed for any critical care needs.      Darel Hong, AGACNP-BC [facility] Pager: [phone] Cell: [phone]

## 2018-10-27 NOTE — ED Notes (Signed)
Family member at bedside talking with pt about needing her checkbook to cover her bills while she is in the hospital. Prompting pt on how she may need to bring checks to hospital to have pt sign them.

## 2018-10-28 ENCOUNTER — Inpatient Hospital Stay: Payer: Medicare Other | Admitting: Anesthesiology

## 2018-10-28 ENCOUNTER — Inpatient Hospital Stay: Payer: Medicare Other

## 2018-10-28 ENCOUNTER — Encounter
Admission: EM | Disposition: A | Discharge status: Skilled Nursing Facility | Payer: Self-pay | Source: Home / Self Care | Attending: Internal Medicine

## 2018-10-28 ENCOUNTER — Inpatient Hospital Stay (HOSPITAL_COMMUNITY)
Admit: 2018-10-28 | Discharge: 2018-10-28 | Disposition: A | Discharge status: Home / Self Care | Payer: Medicare Other | Attending: Internal Medicine | Admitting: Internal Medicine

## 2018-10-28 ENCOUNTER — Encounter: Payer: Self-pay | Admitting: Anesthesiology

## 2018-10-28 DIAGNOSIS — R9431 Abnormal electrocardiogram [ECG] [EKG]: Secondary | ICD-10-CM

## 2018-10-28 DIAGNOSIS — Z01810 Encounter for preprocedural cardiovascular examination: Secondary | ICD-10-CM

## 2018-10-28 HISTORY — PX: INTRAMEDULLARY (IM) NAIL INTERTROCHANTERIC: SHX5875

## 2018-10-28 LAB — MAGNESIUM: Magnesium: 2.1 mg/dL (ref 1.7–2.4)

## 2018-10-28 LAB — BASIC METABOLIC PANEL
Anion gap: 7 (ref 5–15)
BUN: 50 mg/dL — ABNORMAL HIGH (ref 8–23)
CO2: 24 mmol/L (ref 22–32)
Calcium: 8.1 mg/dL — ABNORMAL LOW (ref 8.9–10.3)
Chloride: 110 mmol/L (ref 98–111)
Creatinine, Ser: 2.03 mg/dL — ABNORMAL HIGH (ref 0.44–1.00)
GFR calc Af Amer: 25 mL/min — ABNORMAL LOW (ref >60)
GFR calc non Af Amer: 21 mL/min — ABNORMAL LOW (ref >60)
Glucose, Bld: 109 mg/dL — ABNORMAL HIGH (ref 70–99)
Potassium: 3.3 mmol/L — ABNORMAL LOW (ref 3.5–5.1)
Sodium: 141 mmol/L (ref 135–145)

## 2018-10-28 LAB — CBC
HCT: 29.8 % — ABNORMAL LOW (ref 36.0–46.0)
Hemoglobin: 9.5 g/dL — ABNORMAL LOW (ref 12.0–15.0)
MCH: 30.6 pg (ref 26.0–34.0)
MCHC: 31.9 g/dL (ref 30.0–36.0)
MCV: 96.1 fL (ref 80.0–100.0)
Platelets: 226 10*3/uL (ref 150–400)
RBC: 3.1 MIL/uL — ABNORMAL LOW (ref 3.87–5.11)
RDW: 14.9 % (ref 11.5–15.5)
WBC: 12.9 10*3/uL — ABNORMAL HIGH (ref 4.0–10.5)
nRBC: 0 % (ref 0.0–0.2)

## 2018-10-28 LAB — CK: Total CK: 1579 U/L — ABNORMAL HIGH (ref 38–234)

## 2018-10-28 LAB — GLUCOSE, CAPILLARY
Glucose-Capillary: 96 mg/dL (ref 70–99)
Glucose-Capillary: 103 mg/dL — ABNORMAL HIGH (ref 70–99)
Glucose-Capillary: 100 mg/dL — ABNORMAL HIGH (ref 70–99)
Glucose-Capillary: 124 mg/dL — ABNORMAL HIGH (ref 70–99)
Glucose-Capillary: 125 mg/dL — ABNORMAL HIGH (ref 70–99)

## 2018-10-28 LAB — PROTIME-INR
INR: 1.1 (ref 0.8–1.2)
Prothrombin Time: 14.4 seconds (ref 11.4–15.2)

## 2018-10-28 LAB — ECHOCARDIOGRAM COMPLETE
Height: 63 in
Weight: 1858.92 oz

## 2018-10-28 SURGERY — FIXATION, FRACTURE, INTERTROCHANTERIC, WITH INTRAMEDULLARY ROD
Anesthesia: Spinal | Site: Hip | Laterality: Left

## 2018-10-28 MED ORDER — POTASSIUM CHLORIDE 10 MEQ/100ML IV SOLN
10.0000 meq | INTRAVENOUS | Status: AC
  Administered 2018-10-28 (×4): 10 meq via INTRAVENOUS
  Filled 2018-10-28 (×4): qty 100

## 2018-10-28 MED ORDER — METOCLOPRAMIDE HCL 5 MG PO TABS
5.0000 mg | ORAL_TABLET | Freq: Three times a day (TID) | ORAL | Status: DC | PRN
  Filled 2018-10-28: qty 2

## 2018-10-28 MED ORDER — MIDAZOLAM HCL 5 MG/5ML IJ SOLN
INTRAMUSCULAR | Status: DC | PRN
  Administered 2018-10-28: 0.5 mg via INTRAVENOUS
  Administered 2018-10-28: 1 mg via INTRAVENOUS
  Administered 2018-10-28: 0.5 mg via INTRAVENOUS

## 2018-10-28 MED ORDER — LACTATED RINGERS IV SOLN
INTRAVENOUS | Status: DC | PRN
  Administered 2018-10-28: 15:00:00 via INTRAVENOUS

## 2018-10-28 MED ORDER — PROPOFOL 10 MG/ML IV BOLUS
INTRAVENOUS | Status: AC
  Filled 2018-10-28: qty 20

## 2018-10-28 MED ORDER — NEOMYCIN-POLYMYXIN B GU 40-200000 IR SOLN
Status: DC | PRN
  Administered 2018-10-28: 2 mL

## 2018-10-28 MED ORDER — PHENYLEPHRINE HCL (PRESSORS) 10 MG/ML IV SOLN
INTRAVENOUS | Status: DC | PRN
  Administered 2018-10-28: 50 ug via INTRAVENOUS
  Administered 2018-10-28 (×3): 100 ug via INTRAVENOUS

## 2018-10-28 MED ORDER — BUPIVACAINE HCL (PF) 0.5 % IJ SOLN
INTRAMUSCULAR | Status: DC | PRN
  Administered 2018-10-28: 2.5 mL

## 2018-10-28 MED ORDER — BISACODYL 10 MG RE SUPP
10.0000 mg | Freq: Every day | RECTAL | Status: DC | PRN
  Administered 2018-10-31: 10 mg via RECTAL
  Filled 2018-10-28: qty 1

## 2018-10-28 MED ORDER — PROPOFOL 500 MG/50ML IV EMUL
INTRAVENOUS | Status: AC
  Filled 2018-10-28: qty 50

## 2018-10-28 MED ORDER — MAGNESIUM HYDROXIDE 400 MG/5ML PO SUSP
30.0000 mL | Freq: Every day | ORAL | Status: DC | PRN
  Administered 2018-10-31: 30 mL via ORAL
  Filled 2018-10-28: qty 30

## 2018-10-28 MED ORDER — VASOPRESSIN 20 UNIT/ML IV SOLN
INTRAVENOUS | Status: DC | PRN
  Administered 2018-10-28 (×3): 2 [IU] via INTRAVENOUS
  Administered 2018-10-28: 1 [IU] via INTRAVENOUS

## 2018-10-28 MED ORDER — MAGNESIUM CITRATE PO SOLN
1.0000 | Freq: Once | ORAL | Status: DC | PRN
  Filled 2018-10-28: qty 296

## 2018-10-28 MED ORDER — METOCLOPRAMIDE HCL 5 MG/ML IJ SOLN: 5.0000 mg | Freq: Three times a day (TID) | INTRAMUSCULAR | Status: DC | PRN

## 2018-10-28 MED ORDER — OXYCODONE HCL 5 MG PO TABS: 5.0000 mg | ORAL_TABLET | Freq: Once | ORAL | Status: DC | PRN

## 2018-10-28 MED ORDER — MIDAZOLAM HCL 2 MG/2ML IJ SOLN
INTRAMUSCULAR | Status: AC
  Filled 2018-10-28: qty 2

## 2018-10-28 MED ORDER — BUPIVACAINE HCL (PF) 0.5 % IJ SOLN
INTRAMUSCULAR | Status: AC
  Filled 2018-10-28: qty 10

## 2018-10-28 MED ORDER — LACTATED RINGERS IV SOLN
INTRAVENOUS | Status: DC
  Administered 2018-10-28 – 2018-10-29 (×3): via INTRAVENOUS

## 2018-10-28 MED ORDER — PROPOFOL 10 MG/ML IV BOLUS
INTRAVENOUS | Status: DC | PRN
  Administered 2018-10-28: 20 mg via INTRAVENOUS
  Administered 2018-10-28 (×2): 30 mg via INTRAVENOUS

## 2018-10-28 MED ORDER — CEFAZOLIN SODIUM 1 G IJ SOLR
INTRAMUSCULAR | Status: AC
  Filled 2018-10-28: qty 10

## 2018-10-28 MED ORDER — ACETAMINOPHEN 325 MG PO TABS: 325.0000 mg | ORAL_TABLET | Freq: Four times a day (QID) | ORAL | Status: DC | PRN

## 2018-10-28 MED ORDER — FENTANYL CITRATE (PF) 100 MCG/2ML IJ SOLN: 25.0000 ug | INTRAMUSCULAR | Status: DC | PRN

## 2018-10-28 MED ORDER — SODIUM CHLORIDE 0.9 % IV SOLN
INTRAVENOUS | Status: DC
  Administered 2018-10-28: 17:00:00 via INTRAVENOUS

## 2018-10-28 MED ORDER — ALUM & MAG HYDROXIDE-SIMETH 200-200-20 MG/5ML PO SUSP: 30.0000 mL | ORAL | Status: DC | PRN

## 2018-10-28 MED ORDER — ENOXAPARIN SODIUM 30 MG/0.3ML ~~LOC~~ SOLN
30.0000 mg | SUBCUTANEOUS | Status: DC
  Administered 2018-10-29: 30 mg via SUBCUTANEOUS
  Filled 2018-10-28: qty 0.3

## 2018-10-28 MED ORDER — DOCUSATE SODIUM 100 MG PO CAPS
100.0000 mg | ORAL_CAPSULE | Freq: Two times a day (BID) | ORAL | Status: DC
  Administered 2018-10-28 – 2018-11-03 (×12): 100 mg via ORAL
  Filled 2018-10-28 (×12): qty 1

## 2018-10-28 MED ORDER — KETAMINE HCL 50 MG/ML IJ SOLN
INTRAMUSCULAR | Status: AC
  Filled 2018-10-28: qty 10

## 2018-10-28 MED ORDER — KETAMINE HCL 50 MG/ML IJ SOLN
INTRAMUSCULAR | Status: DC | PRN
  Administered 2018-10-28 (×2): 25 mg via INTRAMUSCULAR

## 2018-10-28 MED ORDER — PROPOFOL 500 MG/50ML IV EMUL
INTRAVENOUS | Status: DC | PRN
  Administered 2018-10-28: 80 ug/kg/min via INTRAVENOUS

## 2018-10-28 MED ORDER — PHENOL 1.4 % MT LIQD
1.0000 | OROMUCOSAL | Status: DC | PRN
  Filled 2018-10-28: qty 177

## 2018-10-28 MED ORDER — MEPERIDINE HCL 50 MG/ML IJ SOLN
INTRAMUSCULAR | Status: AC
  Administered 2018-10-28: 12.5 mg
  Filled 2018-10-28: qty 1

## 2018-10-28 MED ORDER — OXYCODONE HCL 5 MG/5ML PO SOLN: 5.0000 mg | Freq: Once | ORAL | Status: DC | PRN

## 2018-10-28 MED ORDER — VASOPRESSIN 20 UNIT/ML IV SOLN
INTRAVENOUS | Status: AC
  Filled 2018-10-28: qty 1

## 2018-10-28 MED ORDER — MEPERIDINE HCL 25 MG/ML IJ SOLN
12.5000 mg | Freq: Once | INTRAMUSCULAR | Status: DC
  Filled 2018-10-28: qty 1

## 2018-10-28 MED ORDER — CEFAZOLIN SODIUM-DEXTROSE 1-4 GM/50ML-% IV SOLN
1.0000 g | Freq: Four times a day (QID) | INTRAVENOUS | Status: AC
  Administered 2018-10-28 – 2018-10-29 (×4): 1 g via INTRAVENOUS
  Filled 2018-10-28 (×3): qty 50

## 2018-10-28 MED ORDER — CEFAZOLIN SODIUM-DEXTROSE 1-4 GM/50ML-% IV SOLN
1.0000 g | Freq: Once | INTRAVENOUS | Status: AC
  Administered 2018-10-28: 15:00:00 1 g via INTRAVENOUS
  Filled 2018-10-28: qty 50

## 2018-10-28 MED ORDER — MENTHOL 3 MG MT LOZG
1.0000 | LOZENGE | OROMUCOSAL | Status: DC | PRN
  Filled 2018-10-28: qty 9

## 2018-10-28 MED ORDER — TRAMADOL HCL 50 MG PO TABS
50.0000 mg | ORAL_TABLET | Freq: Four times a day (QID) | ORAL | Status: DC
  Administered 2018-10-28 – 2018-11-02 (×12): 50 mg via ORAL
  Filled 2018-10-28 (×15): qty 1

## 2018-10-28 MED ORDER — ORAL CARE MOUTH RINSE
15.0000 mL | Freq: Two times a day (BID) | OROMUCOSAL | Status: DC
  Administered 2018-10-29 – 2018-11-02 (×3): 15 mL via OROMUCOSAL

## 2018-10-28 SURGICAL SUPPLY — 35 items
APL PRP STRL LF DISP 70% ISPRP (MISCELLANEOUS) ×1
BIT DRILL 4.3MMS DISTAL GRDTED (BIT) IMPLANT
CANISTER SUCT 1200ML W/VALVE (MISCELLANEOUS) ×3 IMPLANT
CHLORAPREP W/TINT 26 (MISCELLANEOUS) ×3 IMPLANT
COVER WAND RF STERILE (DRAPES) ×3 IMPLANT
DRAPE 3/4 80X56 (DRAPES) ×3 IMPLANT
DRAPE U-SHAPE 47X51 STRL (DRAPES) ×3 IMPLANT
DRILL 4.3MMS DISTAL GRADUATED (BIT) ×3
DRSG OPSITE POSTOP 3X4 (GAUZE/BANDAGES/DRESSINGS) ×9 IMPLANT
GLOVE BIOGEL PI IND STRL 9 (GLOVE) ×1 IMPLANT
GLOVE BIOGEL PI INDICATOR 9 (GLOVE) ×2
GLOVE SURG SYN 9.0  PF PI (GLOVE) ×2
GLOVE SURG SYN 9.0 PF PI (GLOVE) ×1 IMPLANT
GOWN SRG 2XL LVL 4 RGLN SLV (GOWNS) ×1 IMPLANT
GOWN STRL NON-REIN 2XL LVL4 (GOWNS) ×3
GOWN STRL REUS W/ TWL LRG LVL3 (GOWN DISPOSABLE) ×1 IMPLANT
GOWN STRL REUS W/TWL LRG LVL3 (GOWN DISPOSABLE) ×3
GUIDEPIN VERSANAIL DSP 3.2X444 (ORTHOPEDIC DISPOSABLE SUPPLIES) ×2 IMPLANT
GUIDEWIRE BALL NOSE 100CM (WIRE) ×2 IMPLANT
KIT TURNOVER KIT A (KITS) ×3 IMPLANT
MAT ABSORB  FLUID 56X50 GRAY (MISCELLANEOUS) ×2
MAT ABSORB FLUID 56X50 GRAY (MISCELLANEOUS) ×1 IMPLANT
NAIL HIP FRA AFFIX 130X9X340 L (Nail) ×2 IMPLANT
NDL FILTER BLUNT 18X1 1/2 (NEEDLE) ×1 IMPLANT
NEEDLE FILTER BLUNT 18X 1/2SAF (NEEDLE) ×2
NEEDLE FILTER BLUNT 18X1 1/2 (NEEDLE) ×1 IMPLANT
NS IRRIG 500ML POUR BTL (IV SOLUTION) ×3 IMPLANT
PACK HIP COMPR (MISCELLANEOUS) ×3 IMPLANT
SCALPEL PROTECTED #15 DISP (BLADE) ×6 IMPLANT
SCREW BONE CORTICAL 5.0X44 (Screw) ×2 IMPLANT
SCREW LAG HIP NAIL 10.5X95 (Screw) ×2 IMPLANT
STAPLER SKIN PROX 35W (STAPLE) ×3 IMPLANT
SUT VIC AB 1 CT1 36 (SUTURE) ×3 IMPLANT
SUT VIC AB 2-0 CT1 (SUTURE) ×3 IMPLANT
SYR 10ML LL (SYRINGE) ×3 IMPLANT

## 2018-10-28 NOTE — Progress Notes (Signed)
*  PRELIMINARY RESULTS* Echocardiogram 2D Echocardiogram has been performed.  [name] 10/28/2018, 9:51 AM

## 2018-10-28 NOTE — Op Note (Signed)
10/28/2018  4:13 PM  PATIENT:  [name]  85 y.o. female  PRE-OPERATIVE DIAGNOSIS:  HIP FRACTURE, left intertrochanteric  POST-OPERATIVE DIAGNOSIS:  HIP FRACTURE left intertrochanteric  PROCEDURE:  Procedure(s): INTRAMEDULLARY (IM) NAIL INTERTROCHANTRIC LEFT LONG (Left)  SURGEON: Laurene Footman, MD  ASSISTANTS: None  ANESTHESIA:   spinal  EBL:  Total I/O In: 350 [I.V.:50; IV Piggyback:300] Out: 530 [Urine:525; Blood:5]  BLOOD ADMINISTERED:none  DRAINS: none   LOCAL MEDICATIONS USED:  NONE  SPECIMEN:  No Specimen  DISPOSITION OF SPECIMEN:  N/A  COUNTS:  YES  TOURNIQUET:  * No tourniquets in log *  IMPLANTS: Biomet affixes 9 x 340, 9 mm, 95 mm leg screw 44 mm distal interlocking screw  DICTATION: .Dragon Dictation patient was brought to the operating room and after adequate spinal anesthesia was obtained patient was placed on the fracture table and with traction the fracture reduced well.  Right leg was placed in the well-leg holder.  With adequate visualization and AP and lateral projections the hip was prepped and draped using a barrier drape method.  After patient identification and timeout procedures were completed approximately 4 cm incision was made proximal to greater trochanter and a guidewire inserted into the tip of the trochanter with proximal reaming carried out.  The long guidewire was inserted down the canal and met measurement made off of this for rod length reaming to 11 mm was carried out and a 9 x 340 rod was inserted down the canal to the appropriate depth.  A lateral incision was made and a guidewire was placed in the center center position in the head.  Measurement was made off of this drilling carried out and the 95 mm leg screw inserted.  Traction was released and compression device applied to get good compression at the fracture site.  The setscrew was placed proximally so the screw could not back out and quarter turn loosening to allow for further  compression.  The insertion handle was removed with permanent C arm views obtained.  The distal interlocking screw was identified and a small incision made drilling measuring and a 44 mm screw placed in the distal slotted hole.  The wounds were then irrigated and closed with #1 Vicryl for the deep fascia 2-0 Vicryl subcutaneously and skin staples.  Xeroform and honeycomb dressing applied.  PLAN OF CARE: Continue as inpatient  PATIENT DISPOSITION:  PACU - hemodynamically stable.

## 2018-10-28 NOTE — Anesthesia Procedure Notes (Signed)
Spinal  Patient location during procedure: OR Start time: 10/28/2018 2:49 PM End time: 10/28/2018 3:12 PM Staffing Anesthesiologist: Odeth Bry, Precious Haws, MD Resident/CRNA: Jerrye Noble, CRNA Performed: anesthesiologist and resident/CRNA  Preanesthetic Checklist Completed: patient identified, site marked, surgical consent, pre-op evaluation, timeout performed, IV checked, risks and benefits discussed and monitors and equipment checked Spinal Block Patient position: sitting Prep: Betadine Patient monitoring: heart rate, continuous pulse ox, blood pressure and cardiac monitor Approach: midline Location: L4-5 Injection technique: single-shot Needle Needle type: Whitacre and Introducer  Needle gauge: 24 G Needle length: 9 cm Additional Notes First attempts my CRNA, final attempt by MDA  Negative paresthesia. Negative blood return. Positive free-flowing CSF. Expiration date of kit checked and confirmed. Patient tolerated procedure well, without complications.

## 2018-10-28 NOTE — Transfer of Care (Signed)
Immediate Anesthesia Transfer of Care Note  Patient: [name]  Procedure(s) Performed: INTRAMEDULLARY (IM) NAIL INTERTROCHANTRIC LEFT LONG (Left Hip)  Patient Location: PACU  Anesthesia Type:Spinal  Level of Consciousness: drowsy and patient cooperative  Airway & Oxygen Therapy: Patient Spontanous Breathing and Patient connected to face mask oxygen  Post-op Assessment: Report given to RN and Post -op Vital signs reviewed and stable  Post vital signs: Reviewed and stable  Last Vitals:  Vitals Value Taken Time  BP 132/67 10/28/18 1618  Temp 36.2 C 10/28/18 1618  Pulse 58 10/28/18 1619  Resp 20 10/28/18 1619  SpO2 99 % 10/28/18 1619  Vitals shown include unvalidated device data.  Last Pain:  Vitals:   10/28/18 1238  TempSrc: Oral  PainSc: 10-Worst pain ever         Complications: No apparent anesthesia complications

## 2018-10-28 NOTE — Progress Notes (Signed)
Progress Note  Patient Name: [name] Date of Encounter: 10/28/2018  Primary Cardiologist: New seen by Dr. Saunders Revel  Subjective   The patient developed atrial fibrillation with rapid ventricular response last evening and was transferred to the ICU and started on diltiazem drip.  She converted back to sinus rhythm.  She continues to have severe hip pain.  Inpatient Medications    Scheduled Meds: . Chlorhexidine Gluconate Cloth  6 each Topical Q0600  . gabapentin  100 mg Oral BID  . levothyroxine  50 mcg Oral Q0600  . metoprolol tartrate  25 mg Oral BID   Continuous Infusions: . sodium chloride 100 mL/hr at 10/28/18 0500  . diltiazem (CARDIZEM) infusion Stopped (10/27/18 1945)   PRN Meds: acetaminophen **OR** acetaminophen, HYDROcodone-acetaminophen, morphine injection, ondansetron **OR** ondansetron (ZOFRAN) IV   Vital Signs    Vitals:   10/28/18 0600 10/28/18 0700 10/28/18 0800 10/28/18 0819  BP: (!) 149/55 (!) 103/47 (!) 149/56   Pulse: 61 61 62 72  Resp: 15 14 16  (!) 22  Temp:    97.8 F (36.6 C)  TempSrc:    Axillary  SpO2: 98% 100% 96% 98%  Weight:      Height:        Intake/Output Summary (Last 24 hours) at 10/28/2018 0904 Last data filed at 10/28/2018 0700 Gross per 24 hour  Intake 1292.57 ml  Output 750 ml  Net 542.57 ml   Last 3 Weights 10/27/2018 10/27/2018 03/12/2018  Weight (lbs) 116 lb 2.9 oz 140 lb 142 lb  Weight (kg) 52.7 kg 63.504 kg 64.411 kg      Telemetry    Atrial fibrillation converted to sinus rhythm.- Personally Reviewed  ECG    Atrial fibrillation with RVR yesterday.- Personally Reviewed  Physical Exam   GEN: No acute distress.   Neck: No JVD Cardiac: RRR, no murmurs, rubs, or gallops.  Respiratory: Clear to auscultation bilaterally. GI: Soft, nontender, non-distended  MS: No edema; No deformity. Neuro:  Nonfocal  Psych: Normal affect   Labs    High Sensitivity Troponin:   Recent Labs  Lab 10/27/18 1115 10/27/18 1636   TROPONINIHS 64* 59*      Cardiac EnzymesNo results for input(s): TROPONINI in the last 168 hours. No results for input(s): TROPIPOC in the last 168 hours.   Chemistry Recent Labs  Lab 10/27/18 1115 10/28/18 0411  NA 138 141  K 3.6 3.3*  CL 102 110  CO2 25 24  GLUCOSE 149* 109*  BUN 45* 50*  CREATININE 2.11* 2.03*  CALCIUM 9.1 8.1*  PROT 7.0  --   ALBUMIN 3.8  --   AST 72*  --   ALT 37  --   ALKPHOS 58  --   BILITOT 1.4*  --   GFRNONAA 20* 21*  GFRAA 23* 25*  ANIONGAP 11 7     Hematology Recent Labs  Lab 10/27/18 1115 10/28/18 0411  WBC 18.0* 12.9*  RBC 3.55* 3.10*  HGB 10.8* 9.5*  HCT 32.9* 29.8*  MCV 92.7 96.1  MCH 30.4 30.6  MCHC 32.8 31.9  RDW 14.6 14.9  PLT 297 226    BNPNo results for input(s): BNP, PROBNP in the last 168 hours.   DDimer No results for input(s): DDIMER in the last 168 hours.   Radiology    Dg Chest 1 View  Result Date: 10/27/2018 CLINICAL DATA:  85 year old female with fall. EXAM: CHEST  1 VIEW COMPARISON:  Chest radiograph dated 02/04/2014 FINDINGS: The lungs are clear.  There is no pleural effusion or pneumothorax. The cardiac silhouette is within normal limits. Atherosclerotic calcification of the tortuous aorta. There is osteopenia with multilevel degenerative changes of the spine and shoulder. Elevated right shoulder, likely related to chronic rotator cuff injury. No acute osseous pathology. IMPRESSION: 1. No active disease. 2. Osseous structures are without acute abnormality. Electronically Signed   By: Anner Crete M.D.   On: 10/27/2018 12:12   Ct Head Wo Contrast  Result Date: 10/27/2018 CLINICAL DATA:  Patient had a fall on Monday EXAM: CT HEAD WITHOUT CONTRAST TECHNIQUE: Contiguous axial images were obtained from the base of the skull through the vertex without intravenous contrast. COMPARISON:  Multiple priors, most recent February 04, 2014. FINDINGS: Brain: No evidence of acute territorial infarction, hemorrhage,  hydrocephalus,extra-axial collection or mass lesion/mass effect. There is dilatation the ventricles and sulci consistent with age-related atrophy. Low-attenuation changes in the deep white matter consistent with small vessel ischemia. Vascular: No hyperdense vessel or unexpected calcification. Skull: The skull is intact. No fracture or focal lesion identified. Sinuses/Orbits: The visualized paranasal sinuses and mastoid air cells are clear. The orbits and globes intact. Other: None IMPRESSION: No acute intracranial abnormality. Findings consistent with age related atrophy and chronic small vessel ischemia Electronically Signed   By: Prudencio Pair M.D.   On: 10/27/2018 13:03   Dg Hip Unilat W Or Wo Pelvis 2-3 Views Left  Result Date: 10/27/2018 CLINICAL DATA:  Unwitnessed fall EXAM: DG HIP (WITH OR WITHOUT PELVIS) 2-3V LEFT COMPARISON:  February 04, 2014. FINDINGS: Comminuted fracture of the left hip through the proximal femur involving the greater and lesser trochanter is seen. There is overlap of fracture fragments. There is mild displacement of the lesser trochanter. The femoral head still appears to be well seated within the acetabulum. There is diffuse osteopenia. Limited visualization of the left inferior pubic rami. Degenerative changes in the lower lumbar spine. IMPRESSION: Comminuted proximal left femoral fracture involving the greater and lesser trochanter. Electronically Signed   By: Prudencio Pair M.D.   On: 10/27/2018 12:11    Cardiac Studies   Echocardiogram is pending.  Patient Profile     85 y.o. female   with a hx of hypertension, type 2 diabetes mellitus, hypothyroidism, anxiety, left breast cancer status post mastectomy, who Was seen for the evaluation of elevated troponin and perioperative cardiovascular risk assessment   Assessment & Plan    1.Atrial fibrillation:This was her first documented episode.  She converted to sinus rhythm overnight with diltiazem drip.  Continue  metoprolol.  No need for long-term anticoagulation for now given that the episode was brief and in the setting of significant pain and metabolic abnormalities.  She is also waiting for hip surgery.  If she develops recurrent atrial fibrillation, she will require long-term anticoagulation.  2.  Mildly elevated troponin: Likely supply demand ischemia.  No further ischemic work-up is recommended.  Echo is pending.  3.  Preoperative cardiovascular evaluation for hip surgery: The patient is at moderate risk.  No contraindication from a cardiac standpoint.  4.  Acute renal failure likely due to dehydration and rhabdomyolysis: Continue hydration.       For questions or updates, please contact [place] Please consult [email or web address] for contact info under        Signed, Kathlyn Sacramento, MD  10/28/2018, 9:04 AM

## 2018-10-28 NOTE — Progress Notes (Signed)
Pt shivering. Dr Andree Elk notified. Acknowledged. Orders received.

## 2018-10-28 NOTE — Progress Notes (Signed)
[facility] [place] NAME: [name]    MR#:  [id number]  DATE OF BIRTH:  09/30/1926  SUBJECTIVE:  patient was admitted to the ICU after she had atrial fibrillation was on diltiazem drip now in sinus rhythm seen by cardiology. Patient scheduled for hip surgery today. Family in the room REVIEW OF SYSTEMS:   Review of Systems  Constitutional: Negative for chills, fever and weight loss.  HENT: Negative for ear discharge, ear pain and nosebleeds.   Eyes: Negative for blurred vision, pain and discharge.  Respiratory: Negative for sputum production, shortness of breath, wheezing and stridor.   Cardiovascular: Negative for chest pain, palpitations, orthopnea and PND.  Gastrointestinal: Negative for abdominal pain, diarrhea, nausea and vomiting.  Genitourinary: Negative for frequency and urgency.  Musculoskeletal: Positive for falls and joint pain. Negative for back pain.  Neurological: Positive for weakness. Negative for sensory change, speech change and focal weakness.  Psychiatric/Behavioral: Negative for depression and hallucinations. The patient is not nervous/anxious.    Tolerating Diet:npo Tolerating PT: pending  DRUG ALLERGIES:   Allergies  Allergen Reactions  . Amlodipine Other (See Comments)    Leg edema   . Atenolol Other (See Comments)    REACTION: bradycardia  . Erythromycin Base Other (See Comments)    REACTION: stomach upset  . Lisinopril Other (See Comments)    REACTION: cough  (20 mg)    VITALS:  Blood pressure (!) 146/53, pulse 66, temperature 97.8 F (36.6 C), temperature source Oral, resp. rate 20, height 5\' 3"  (1.6 m), weight 52.7 kg, SpO2 98 %.  PHYSICAL EXAMINATION:   Physical Exam  GENERAL:  85 y.o.-year-old patient lying in the bed with no acute distress.  EYES: Pupils equal, round, reactive to light and accommodation. No scleral icterus. Extraocular muscles intact.  HEENT: Head atraumatic,  normocephalic. Oropharynx and nasopharynx clear.  NECK:  Supple, no jugular venous distention. No thyroid enlargement, no tenderness.  LUNGS: Normal breath sounds bilaterally, no wheezing, rales, rhonchi. No use of accessory muscles of respiration.  CARDIOVASCULAR: S1, S2 normal. No murmurs, rubs, or gallops.  ABDOMEN: Soft, nontender, nondistended. Bowel sounds present. No organomegaly or mass.  EXTREMITIES: No cyanosis, clubbing or edema b/l.   Left leg restricted range of motion NEUROLOGIC: Cranial nerves II through XII are intact. No focal Motor or sensory deficits b/l.   PSYCHIATRIC:  patient is alert and oriented x 3.  SKIN: No obvious rash, lesion, or ulcer.   LABORATORY PANEL:  CBC Recent Labs  Lab 10/28/18 0411  WBC 12.9*  HGB 9.5*  HCT 29.8*  PLT 226    Chemistries  Recent Labs  Lab 10/27/18 1115 10/28/18 0411 10/28/18 0746  NA 138 141  --   K 3.6 3.3*  --   CL 102 110  --   CO2 25 24  --   GLUCOSE 149* 109*  --   BUN 45* 50*  --   CREATININE 2.11* 2.03*  --   CALCIUM 9.1 8.1*  --   MG  --   --  2.1  AST 72*  --   --   ALT 37  --   --   ALKPHOS 58  --   --   BILITOT 1.4*  --   --    Cardiac Enzymes No results for input(s): TROPONINI in the last 168 hours. RADIOLOGY:  Dg Chest 1 View  Result Date: 10/27/2018 CLINICAL DATA:  85 year old female with fall. EXAM: CHEST  1 VIEW COMPARISON:  Chest radiograph dated 02/04/2014 FINDINGS: The lungs are clear. There is no pleural effusion or pneumothorax. The cardiac silhouette is within normal limits. Atherosclerotic calcification of the tortuous aorta. There is osteopenia with multilevel degenerative changes of the spine and shoulder. Elevated right shoulder, likely related to chronic rotator cuff injury. No acute osseous pathology. IMPRESSION: 1. No active disease. 2. Osseous structures are without acute abnormality. Electronically Signed   By: Anner Crete M.D.   On: 10/27/2018 12:12   Ct Head Wo  Contrast  Result Date: 10/27/2018 CLINICAL DATA:  Patient had a fall on Monday EXAM: CT HEAD WITHOUT CONTRAST TECHNIQUE: Contiguous axial images were obtained from the base of the skull through the vertex without intravenous contrast. COMPARISON:  Multiple priors, most recent February 04, 2014. FINDINGS: Brain: No evidence of acute territorial infarction, hemorrhage, hydrocephalus,extra-axial collection or mass lesion/mass effect. There is dilatation the ventricles and sulci consistent with age-related atrophy. Low-attenuation changes in the deep white matter consistent with small vessel ischemia. Vascular: No hyperdense vessel or unexpected calcification. Skull: The skull is intact. No fracture or focal lesion identified. Sinuses/Orbits: The visualized paranasal sinuses and mastoid air cells are clear. The orbits and globes intact. Other: None IMPRESSION: No acute intracranial abnormality. Findings consistent with age related atrophy and chronic small vessel ischemia Electronically Signed   By: Prudencio Pair M.D.   On: 10/27/2018 13:03   Dg Hip Unilat W Or Wo Pelvis 2-3 Views Left  Result Date: 10/27/2018 CLINICAL DATA:  Unwitnessed fall EXAM: DG HIP (WITH OR WITHOUT PELVIS) 2-3V LEFT COMPARISON:  February 04, 2014. FINDINGS: Comminuted fracture of the left hip through the proximal femur involving the greater and lesser trochanter is seen. There is overlap of fracture fragments. There is mild displacement of the lesser trochanter. The femoral head still appears to be well seated within the acetabulum. There is diffuse osteopenia. Limited visualization of the left inferior pubic rami. Degenerative changes in the lower lumbar spine. IMPRESSION: Comminuted proximal left femoral fracture involving the greater and lesser trochanter. Electronically Signed   By: Prudencio Pair M.D.   On: 10/27/2018 12:11   ASSESSMENT AND PLAN:   [name]  is a 85 y.o. female with a known history of essential hypertension,  hypothyroidism and anxiety who resides by herself.  Who 2 days ago was trying to lock her door from inside and fell backwards.  Patient stayed on the ground overnight.   1.  Left comminuted hip fracture-proximal femoral fracture -Orthopedic consultation with Dr. Rudene Christians. Patient scheduled for surgery today pain control -patient seen by cardiology Dr. Fletcher Anon -patient was in the ICU given a fib. She was on diltiazem drip now in sinus rhythm diltiazem drip weaned off -defer anticoagulation if needed per cardiology. Only she is in sinus rhythm   2.  Acute kidney injury this is due to dehydration - IV fluids monitor her renal function, I will hold HCTZ and losartan  3.  Hypothyroidism continue Synthroid  4.  Elevated troponin this is due to acute febrile malice is given patient remained overnight on the floor after she fell  -continue IV hydration -CPK  5.  Leukocytosis likely reactive we will check a urinalysis chest x-ray is negative  6.  Miscellaneous SCDs for DVT prophylaxis  discussed with daughter in the room   CODE STATUS: full  DVT Prophylaxis: pending hip surgery  TOTAL TIME TAKING CARE OF THIS PATIENT: *30 minutes.  >50% time spent on counselling and coordination of care  POSSIBLE D/C IN 2-3* DAYS, DEPENDING ON CLINICAL CONDITION.  Note: This dictation was prepared with Dragon dictation along with smaller phrase technology. Any transcriptional errors that result from this process are unintentional.  Fritzi Mandes M.D on 10/28/2018 at 3:57 PM  Between 7am to 6pm - Pager - [phone]  After 6pm go to [email or web address] - password [employer]  [facility]  Office  [phone]  CC: Primary care physician; McLean-Scocuzza, Nino Glow, MDPatient ID: Debe Coder, female   DOB: [birth year], 85 y.o.   MRN: [id number]

## 2018-10-28 NOTE — Anesthesia Preprocedure Evaluation (Signed)
Anesthesia Evaluation  Patient identified by MRN, date of birth, ID band Patient awake    Reviewed: Allergy & Precautions, H&P , NPO status , Patient's Chart, lab work & pertinent test results  History of Anesthesia Complications (+) PONV and history of anesthetic complications  Airway Mallampati: III  TM Distance: <3 FB Neck ROM: limited    Dental  (+) Chipped, Poor Dentition, Missing   Pulmonary neg pulmonary ROS, neg shortness of breath,           Cardiovascular Exercise Tolerance: Good hypertension, (-) angina(-) Past MI and (-) DOE + dysrhythmias Atrial Fibrillation      Neuro/Psych PSYCHIATRIC DISORDERS negative neurological ROS     GI/Hepatic Neg liver ROS, GERD  ,  Endo/Other  diabetes, Type 2Hypothyroidism   Renal/GU ARFRenal disease     Musculoskeletal  (+) Arthritis ,   Abdominal   Peds  Hematology negative hematology ROS (+)   Anesthesia Other Findings Past Medical History: 12/2002: Anxiety No date: Arthritis     Comment:  hands No date: Cancer [facility])     Comment:  breast left arm No date: Cancer ([place])     Comment:  uterine 11/2004: Diabetes mellitus     Comment:  Type 2 No date: Dyspnea     Comment:  WITH EXERTION No date: GERD (gastroesophageal reflux disease) 11/2004: Hyperlipidemia 12/2002: Hypertension No date: Hypothyroidism 02/2002: Osteopenia     Comment:  02/2002 Dexa osteopenia// Dexa 02/26/2004 (Dr. Ubaldo Glassing)               stable/IMR femur/spine No date: Spinal stenosis No date: Thyroid disease     Comment:  Hypothryoid after radio ablation of thyroid// Dr.               Ronnald Collum endocrinologist No date: Vertigo  Past Surgical History: 1990: ABDOMINAL HYSTERECTOMY     Comment:  endometrial adenoca 1980: BREAST SURGERY     Comment:  left breast lumpectomy //abscess of breast evac. 12/11/2016: CATARACT EXTRACTION W/PHACO; Left     Comment:  Procedure: CATARACT EXTRACTION PHACO  AND INTRAOCULAR               LENS PLACEMENT (IOC);  Surgeon: Eulogio Bear, MD;                Location: [facility] ORS;  Service: Ophthalmology;  Laterality:              Left;  [place] 1:18.4 AP 36.6% CDE 16.13 01/01/2017: CATARACT EXTRACTION W/PHACO; Right     Comment:  Procedure: CATARACT EXTRACTION PHACO AND INTRAOCULAR               LENS PLACEMENT (IOC);  Surgeon: Eulogio Bear, MD;                Location: [facility] ORS;  Service: Ophthalmology;  Laterality:              Right;  [place]: 01:45.5 AP% 16.6 CDE 18.21 Fluid pack lot               # [id number] H 07/10/1987: CHOLECYSTECTOMY No date: EYE SURGERY     Comment:  Tear Duct surgery, b/l cataract 2019  No date: MASTECTOMY; Left  BMI    Body Mass Index: 20.58 kg/m      Reproductive/Obstetrics negative OB ROS                             Anesthesia Physical Anesthesia  Plan  ASA: IV  Anesthesia Plan: Spinal   Post-op Pain Management:    Induction:   PONV Risk Score and Plan:   Airway Management Planned: Natural Airway and Nasal Cannula  Additional Equipment:   Intra-op Plan:   Post-operative Plan:   Informed Consent: I have reviewed the patients History and Physical, chart, labs and discussed the procedure including the risks, benefits and alternatives for the proposed anesthesia with the patient or authorized representative who has indicated his/her understanding and acceptance.     Dental Advisory Given  Plan Discussed with: Anesthesiologist, CRNA and Surgeon  Anesthesia Plan Comments: (Patient has cardiac clearance for this procedure.   Patient reports no bleeding problems and no anticoagulant use.  Plan for spinal with backup GA  Patient consented for risks of anesthesia including but not limited to:  - adverse reactions to medications - risk of bleeding, infection, nerve damage and headache - risk of failed spinal - damage to teeth, lips or other oral mucosa - sore throat or  hoarseness - Damage to heart, brain, lungs or loss of life  Patient voiced understanding.)        Anesthesia Quick Evaluation

## 2018-10-28 NOTE — Anesthesia Post-op Follow-up Note (Signed)
Anesthesia QCDR form completed.        

## 2018-10-29 ENCOUNTER — Encounter: Payer: Self-pay | Admitting: Orthopedic Surgery

## 2018-10-29 DIAGNOSIS — I480 Paroxysmal atrial fibrillation: Secondary | ICD-10-CM

## 2018-10-29 DIAGNOSIS — E860 Dehydration: Secondary | ICD-10-CM

## 2018-10-29 LAB — CBC
HCT: 25 % — ABNORMAL LOW (ref 36.0–46.0)
Hemoglobin: 8.1 g/dL — ABNORMAL LOW (ref 12.0–15.0)
MCH: 30.5 pg (ref 26.0–34.0)
MCHC: 32.4 g/dL (ref 30.0–36.0)
MCV: 94 fL (ref 80.0–100.0)
Platelets: 195 10*3/uL (ref 150–400)
RBC: 2.66 MIL/uL — ABNORMAL LOW (ref 3.87–5.11)
RDW: 14.7 % (ref 11.5–15.5)
WBC: 11 10*3/uL — ABNORMAL HIGH (ref 4.0–10.5)
nRBC: 0 % (ref 0.0–0.2)

## 2018-10-29 LAB — BASIC METABOLIC PANEL
Anion gap: 5 (ref 5–15)
BUN: 44 mg/dL — ABNORMAL HIGH (ref 8–23)
CO2: 23 mmol/L (ref 22–32)
Calcium: 7.8 mg/dL — ABNORMAL LOW (ref 8.9–10.3)
Chloride: 110 mmol/L (ref 98–111)
Creatinine, Ser: 1.59 mg/dL — ABNORMAL HIGH (ref 0.44–1.00)
GFR calc Af Amer: 33 mL/min — ABNORMAL LOW (ref >60)
GFR calc non Af Amer: 28 mL/min — ABNORMAL LOW (ref >60)
Glucose, Bld: 113 mg/dL — ABNORMAL HIGH (ref 70–99)
Potassium: 3.8 mmol/L (ref 3.5–5.1)
Sodium: 138 mmol/L (ref 135–145)

## 2018-10-29 LAB — GLUCOSE, CAPILLARY
Glucose-Capillary: 95 mg/dL (ref 70–99)
Glucose-Capillary: 114 mg/dL — ABNORMAL HIGH (ref 70–99)
Glucose-Capillary: 116 mg/dL — ABNORMAL HIGH (ref 70–99)
Glucose-Capillary: 107 mg/dL — ABNORMAL HIGH (ref 70–99)

## 2018-10-29 MED ORDER — CALCIUM CARBONATE-VITAMIN D 500-200 MG-UNIT PO TABS
2.0000 | ORAL_TABLET | Freq: Two times a day (BID) | ORAL | Status: DC
  Administered 2018-10-29 – 2018-11-03 (×10): 2 via ORAL
  Filled 2018-10-29 (×10): qty 2

## 2018-10-29 MED ORDER — ENOXAPARIN SODIUM 30 MG/0.3ML ~~LOC~~ SOLN
30.0000 mg | SUBCUTANEOUS | Status: DC
  Administered 2018-10-30 – 2018-11-02 (×4): 30 mg via SUBCUTANEOUS
  Filled 2018-10-29 (×4): qty 0.3

## 2018-10-29 MED ORDER — CALCIUM GLUCONATE-NACL 1-0.675 GM/50ML-% IV SOLN
1.0000 g | Freq: Once | INTRAVENOUS | Status: DC
  Filled 2018-10-29: qty 50

## 2018-10-29 MED ORDER — FE FUMARATE-B12-VIT C-FA-IFC PO CAPS
1.0000 | ORAL_CAPSULE | Freq: Two times a day (BID) | ORAL | Status: DC
  Administered 2018-10-29 – 2018-11-03 (×11): 1 via ORAL
  Filled 2018-10-29 (×13): qty 1

## 2018-10-29 MED ORDER — POTASSIUM CHLORIDE CRYS ER 20 MEQ PO TBCR
20.0000 meq | EXTENDED_RELEASE_TABLET | Freq: Once | ORAL | Status: AC
  Administered 2018-10-29: 12:00:00 20 meq via ORAL
  Filled 2018-10-29: qty 1

## 2018-10-29 NOTE — Progress Notes (Signed)
   Subjective: 1 Day Post-Op Procedure(s) (LRB): INTRAMEDULLARY (IM) NAIL INTERTROCHANTRIC LEFT LONG (Left) Patient reports pain as mild.   Patient is well, and has had no acute complaints or problems Denies any CP, SOB, ABD pain. We will continue therapy today.  Plan is to go Skilled nursing facility after hospital stay.  Objective: Vital signs in last 24 hours: Temp:  [97.2 F (36.2 C)-99.4 F (37.4 C)] 97.7 F (36.5 C) (08/21 0728) Pulse Rate:  [55-75] 69 (08/21 0728) Resp:  [14-25] 19 (08/21 0728) BP: (92-146)/(37-106) 126/48 (08/21 0700) SpO2:  [93 %-100 %] 96 % (08/21 0728) FiO2 (%):  [28 %] 28 % (08/20 1804)  Intake/Output from previous day: 08/20 0701 - 08/21 0700 In: 2586.6 [I.V.:2036.6; IV Piggyback:550] Out: [id number] [Urine:1030; Blood:5] Intake/Output this shift: No intake/output data recorded.  Recent Labs    10/27/18 1115 10/28/18 0411 10/29/18 0308  HGB 10.8* 9.5* 8.1*   Recent Labs    10/28/18 0411 10/29/18 0308  WBC 12.9* 11.0*  RBC 3.10* 2.66*  HCT 29.8* 25.0*  PLT 226 195   Recent Labs    10/28/18 0411 10/29/18 0308  NA 141 138  K 3.3* 3.8  CL 110 110  CO2 24 23  BUN 50* 44*  CREATININE 2.03* 1.59*  GLUCOSE 109* 113*  CALCIUM 8.1* 7.8*   Recent Labs    10/28/18 0746  INR 1.1    EXAM General - Patient is Alert, Appropriate and Oriented Extremity - Neurovascular intact Sensation intact distally Intact pulses distally Dorsiflexion/Plantar flexion intact No cellulitis present Compartment soft Dressing - dressing C/D/I and no drainage Motor Function - intact, moving foot and toes well on exam.   Past Medical History:  Diagnosis Date  . Anxiety 12/2002  . Arthritis    hands  . Cancer ([place])    breast left arm  . Cancer ([place])    uterine  . Diabetes mellitus 11/2004   Type 2  . Dyspnea    WITH EXERTION  . GERD (gastroesophageal reflux disease)   . Hyperlipidemia 11/2004  . Hypertension 12/2002  . Hypothyroidism   .  Osteopenia 02/2002   02/2002 Dexa osteopenia// Dexa 02/26/2004 (Dr. Ubaldo Glassing) stable/IMR femur/spine  . Spinal stenosis   . 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 Ronnald Collum endocrinologist  . Vertigo     Assessment/Plan:   1 Day Post-Op Procedure(s) (LRB): INTRAMEDULLARY (IM) NAIL INTERTROCHANTRIC LEFT LONG (Left) Active Problems:   Comminuted fracture of left hip ([place])  Estimated body mass index is 20.58 kg/m as calculated from the following:   Height as of this encounter: 5\' 3"  (1.6 m).   Weight as of this encounter: 52.7 kg. Advance diet Up with therapy  Needs BM Acute post op blood loss anemia - Hgb 8.1. Recheck Hgb in the am. Start oral iron supplement.  CM to assist with discharge  DVT Prophylaxis - Lovenox, TED hose and SCDs Weight-Bearing as tolerated to left leg   T. Rachelle Hora, PA-C [place] 10/29/2018, 9:40 AM

## 2018-10-29 NOTE — Progress Notes (Signed)
Pharmacy Electrolyte Monitoring Consult:  Pharmacy consulted to assist in monitoring and replacing electrolytes in this 85 y.o. female admitted on 10/27/2018 with Fall   Labs:  Sodium  Date Value  10/29/2018 138 mmol/L  02/04/2014 140 mmol/L  11/15/2009 139 mEq/L   Potassium  Date Value  10/29/2018 3.8 mmol/L  02/04/2014 3.9 mmol/L  11/15/2009 3.9 mEq/L   Magnesium (mg/dL)  Date Value  10/28/2018 2.1   Phosphorus (mg/dL)  Date Value  04/30/2010 3.8   Calcium (mg/dL)  Date Value  10/29/2018 7.8 (L)  11/15/2009 10.1   Calcium, Total (mg/dL)  Date Value  02/04/2014 8.9   Albumin (g/dL)  Date Value  10/27/2018 3.8  02/04/2014 3.6    Assessment/Plan: Patient receiving LR at 36mL/hr.   Potassium 20mEq PO x 1. Calcium Carbonate 2 tabs BID.   Obtain BMP/Magnesium with am labs.   Will replace for goal potassium ~ 4, goal magnesium ~ 2, and goal calcium > 8.   Pharmacy will continue to monitor and adjust per consult.    Rikki Smestad L 10/29/2018 4:20 PM

## 2018-10-29 NOTE — Progress Notes (Signed)
PT Cancellation Note  Patient Details Name: [name] MRN: [id number] DOB: 1926/09/14   Cancelled Treatment:    Reason Eval/Treat Not Completed: Pain limiting ability to participate(Consult received and chart reviewed.  Per discussion with OT, patient in significant pain this AM; unable to tolerate exercise or mobility attempts at this time.  RN informed/aware and administering medication to address.  Will re-attempt in PM as medically appropriate, available and pain allows.)   Abdon Petrosky H. Owens Shark, PT, DPT, NCS 10/29/18, 11:21 AM [phone]

## 2018-10-29 NOTE — Evaluation (Signed)
Occupational Therapy Evaluation Patient Details Name: [name] MRN: [id number] DOB: 05/13/1926 Today's Date: 10/29/2018    History of Present Illness [name] is a 85 y.o. s/p L IM nail after a mechanical fall at home. Per chart, the pt fell backwards locking Stacy door and remained on the ground overnight until she was found the next day by [name]. According to chart, [name] picked Stacy up and put Stacy in Stacy recliner where she stayed until a friend visited the following day and called EMS. In the emergency department, labs were notable for acute kidney injury and elevated creatine kinase consistent with rhabdomyolysis. Pt currently in CCU for cardiac monitoring and mgt of Atrial fibrillation with RVR, and mild elevation in troponins. PMH includes: hypertension, DM2, hypothyroidism, anxiety, & left breast cancer s/p mastectomy.   Clinical Impression   [name] was seen for OT evaluation on this date POD #1 from above sx. Pt was generally independent in all ADLs prior to surgery, however using SPC or RW for mobility within the home. Pt also endorses that she has friends who assist Stacy with grocery shopping and IADL mgt. Pt significantly pain limited on this date, and was unable to participate in attempts at bed mobility. Pt tearful t/o session speaking about Stacy significant pain as well as relatives who have passed away. OT utilized therapeutic use of self throughout session to provide emotional support and encouragement. Pt educated in cognitive behavioral pain mgt strategies to support participation and mobility after sx. Pt unable to tolerate additional ADLs or mobility attempts during evaluation,but did return verbalize understanding of education provided. Pt would benefit from additional instruction in self care skills and techniques to help maintain precautions with or without assistive devices to support recall and carryover prior to discharge. Recommend STR to upon discharge to maximize safety  and return to PLOF.       Follow Up Recommendations  SNF;Supervision/Assistance - 24 hour    Equipment Recommendations  3 in 1 bedside commode    Recommendations for Other Services       Precautions / Restrictions Precautions Precautions: Fall Restrictions Weight Bearing Restrictions: Yes LLE Weight Bearing: Weight bearing as tolerated Other Position/Activity Restrictions: Per nursing orders, pt is WBAT on LLE.      Mobility Bed Mobility Overal bed mobility: Needs Assistance Bed Mobility: Rolling Rolling: Max assist;Total assist         General bed mobility comments: Pt assisted with repositioning in bed on this date. Required max-total assist 2/2 pain. Pt unable to tolerate much movement at all in bed.  Transfers                 General transfer comment: Deferred 2/2 pain.    Balance                                           ADL either performed or assessed with clinical judgement   ADL                                         General ADL Comments: ADL assessment very limited due to pt pain level on this date. Pt likely near max assist for LB adl mgt. Set-up to min assist for self feeding. Functional mobility deferred 2/2 pain.  Will continue to assess.     Vision Baseline Vision/History: Macular Degeneration Patient Visual Report: No change from baseline       Perception     Praxis      Pertinent Vitals/Pain Pain Assessment: Faces Faces Pain Scale: Hurts worst Pain Location: L hip and BLE's Pain Descriptors / Indicators: Crying;Grimacing;Sore Pain Intervention(s): Limited activity within patient's tolerance;Monitored during session;Utilized relaxation techniques;Repositioned     Hand Dominance Right   Extremity/Trunk Assessment Upper Extremity Assessment Upper Extremity Assessment: Generalized weakness(BUE grossly 3/5. Pt unable to maintain positions above midline.Grip limited. May be due to pain on this  date. Will continue to monitor.)   Lower Extremity Assessment Lower Extremity Assessment: Generalized weakness;LLE deficits/detail;Defer to PT evaluation LLE Deficits / Details: s/p IM nail. LLE: Unable to fully assess due to pain LLE Coordination: decreased fine motor;decreased gross motor       Communication Communication Communication: No difficulties   Cognition Arousal/Alertness: Awake/alert Behavior During Therapy: Restless Overall Cognitive Status: Difficult to assess                                 General Comments: Pt generally A&O but very limited by pain. Yelled out regularly t/o session. Crying and stating she felt like she would pass out.   General Comments       Exercises     Shoulder Instructions      Home Living Family/patient expects to be discharged to:: Private residence Living Arrangements: Alone Available Help at Discharge: Friend(s);Family;Available PRN/intermittently(Pt reports family relationships are strained. States [name] is not consistently available/willing to help. Friends check in and assist with IADLs.) Type of Home: House Home Access: Stairs to enter     Home Layout: One level;Other (Comment)(Pt reports 1 step up inside from livingroom to kitchen.)               Home Equipment: Gilford Rile - 2 wheels;Cane - single point;Wheelchair - manual   Additional Comments: Pt states she uses Stacy cane primarily in the home. Does not leave Stacy house much.      Prior Functioning/Environment Level of Independence: Needs assistance    ADL's / Homemaking Assistance Needed: Friends assist with driving, grocery shopping, etc. Pt states she is generally independent with ADL mgt but does have difficulty with washing Stacy back when bathing. Does not have consistent help to assist.            OT Problem List: Decreased strength;Decreased coordination;Pain;Decreased range of motion;Decreased activity tolerance;Decreased safety  awareness;Decreased knowledge of use of DME or AE;Impaired balance (sitting and/or standing);Decreased knowledge of precautions;Impaired UE functional use      OT Treatment/Interventions: Self-care/ADL training;Balance training;Therapeutic exercise;Therapeutic activities;DME and/or AE instruction;Patient/family education    OT Goals(Current goals can be found in the care plan section) Acute Rehab OT Goals Patient Stated Goal: To have less pain OT Goal Formulation: With patient Time For Goal Achievement: 11/12/18 Potential to Achieve Goals: Good ADL Goals Pt Will Perform Lower Body Dressing: with min assist;with set-up;sit to/from stand;with adaptive equipment(With LRAD PRN for improved safety and functional independence.) Pt Will Transfer to Toilet: ambulating;bedside commode;with min assist;with mod assist(With LRAD PRN for improved safety and functional independence) Additional ADL Goal #1: Pt will independently verbalize a plan to implement at least 3 learned falls prevention strategies into Stacy home environment/daily routines for improved safety and functional independence upon hospital DC.  OT Frequency: Min 1X/week   Barriers to D/C:  Co-evaluation              AM-PAC OT "6 Clicks" Daily Activity     Outcome Measure Help from another person eating meals?: A Little Help from another person taking care of personal grooming?: A Little Help from another person toileting, which includes using toliet, bedpan, or urinal?: Total Help from another person bathing (including washing, rinsing, drying)?: Total Help from another person to put on and taking off regular upper body clothing?: A Little Help from another person to put on and taking off regular lower body clothing?: Total 6 Click Score: 12   End of Session Nurse Communication: Other (comment)(Pain level.)  Activity Tolerance: Patient limited by pain;Treatment limited secondary to agitation Patient left: in  bed;with call bell/phone within reach;with bed alarm set;with SCD's reapplied  OT Visit Diagnosis: Other abnormalities of gait and mobility (R26.89);Muscle weakness (generalized) (M62.81);History of falling (Z91.81);Pain Pain - Right/Left: Left Pain - part of body: Hip                Time: [id number] OT Time Calculation (min): 33 min Charges:  OT General Charges $OT Visit: 1 Visit OT Evaluation $OT Eval Moderate Complexity: 1 Mod OT Treatments $Self Care/Home Management : 8-22 mins  Shara Blazing, M.S., OTR/L Ascom: [phone] 10/29/18, 12:29 PM

## 2018-10-29 NOTE — Progress Notes (Signed)
Progress Note  Patient Name: [name] Date of Encounter: 10/29/2018  Primary Cardiologist: [facility], Dr. Saunders Revel  Subjective   Denies CP. No palpitations, despite PVCs (still in SR) seen on telemetry monitor history and while in the room.  Slightly SOB on [place] oxygen.  She is in significant pain this AM, screaming 2/2 severe hip pain each time the bed readjusts its air. She stated she feels as if she may pass out, which she attributed to her pain.   Inpatient Medications    Scheduled Meds:  Chlorhexidine Gluconate Cloth  6 each Topical Q0600   docusate sodium  100 mg Oral BID   [START ON 10/30/2018] enoxaparin (LOVENOX) injection  30 mg Subcutaneous Q24H   ferrous Q000111Q C-folic acid  1 capsule Oral BID   gabapentin  100 mg Oral BID   levothyroxine  50 mcg Oral Q0600   mouth rinse  15 mL Mouth Rinse BID   meperidine (DEMEROL) injection  12.5 mg Intravenous Once   metoprolol tartrate  25 mg Oral BID   traMADol  50 mg Oral Q6H   Continuous Infusions:  lactated ringers 50 mL/hr at 10/29/18 0243   PRN Meds: acetaminophen **OR** acetaminophen, alum & mag hydroxide-simeth, bisacodyl, HYDROcodone-acetaminophen, magnesium citrate, magnesium hydroxide, menthol-cetylpyridinium **OR** phenol, metoCLOPramide **OR** metoCLOPramide (REGLAN) injection, morphine injection, ondansetron **OR** ondansetron (ZOFRAN) IV   Vital Signs    Vitals:   10/29/18 0728 10/29/18 0800 10/29/18 0900 10/29/18 1000  BP:  (!) 123/46 (!) 130/49 (!) 151/52  Pulse: 69 66 70 75  Resp: 19 16 (!) 26 17  Temp: 97.7 F (36.5 C)     TempSrc: Oral     SpO2: 96% 90% 95% 96%  Weight:      Height:        Intake/Output Summary (Last 24 hours) at 10/29/2018 1129 Last data filed at 10/29/2018 0400 Gross per 24 hour  Intake 2436.6 ml  Output 1035 ml  Net 1401.6 ml   Last 3 Weights 10/27/2018 10/27/2018 03/12/2018  Weight (lbs) 116 lb 2.9 oz 140 lb 142 lb  Weight (kg) 52.7 kg 63.504 kg  64.411 kg      Telemetry    SR, multiple PVCs (after previous Afib s/p converting to SR on diltiazem)- Personally Reviewed  ECG    No new tracings. Earlier Afib with RVR- Personally Reviewed  Physical Exam   GEN: Oriented but at times not able to respond due to pain Neck: No JVD Cardiac: RRR, no murmurs, rubs, or gallops.  Respiratory: Clear to auscultation bilaterally. GI: Soft, nontender, non-distended  MS: Minimal bilateral edema; No deformity. Neuro:  Nonfocal  Psych: Normal affect   Labs    High Sensitivity Troponin:   Recent Labs  Lab 10/27/18 1115 10/27/18 1636  TROPONINIHS 64* 59*      Cardiac EnzymesNo results for input(s): TROPONINI in the last 168 hours. No results for input(s): TROPIPOC in the last 168 hours.   Chemistry Recent Labs  Lab 10/27/18 1115 10/28/18 0411 10/29/18 0308  NA 138 141 138  K 3.6 3.3* 3.8  CL 102 110 110  CO2 25 24 23   GLUCOSE 149* 109* 113*  BUN 45* 50* 44*  CREATININE 2.11* 2.03* 1.59*  CALCIUM 9.1 8.1* 7.8*  PROT 7.0  --   --   ALBUMIN 3.8  --   --   AST 72*  --   --   ALT 37  --   --   ALKPHOS 58  --   --  BILITOT 1.4*  --   --   GFRNONAA 20* 21* 28*  GFRAA 23* 25* 33*  ANIONGAP 11 7 5      Hematology Recent Labs  Lab 10/27/18 1115 10/28/18 0411 10/29/18 0308  WBC 18.0* 12.9* 11.0*  RBC 3.55* 3.10* 2.66*  HGB 10.8* 9.5* 8.1*  HCT 32.9* 29.8* 25.0*  MCV 92.7 96.1 94.0  MCH 30.4 30.6 30.5  MCHC 32.8 31.9 32.4  RDW 14.6 14.9 14.7  PLT 297 226 195    BNPNo results for input(s): BNP, PROBNP in the last 168 hours.   DDimer No results for input(s): DDIMER in the last 168 hours.   Radiology    Dg Chest 1 View  Result Date: 10/27/2018 CLINICAL DATA:  85 year old female with fall. EXAM: CHEST  1 VIEW COMPARISON:  Chest radiograph dated 02/04/2014 FINDINGS: The lungs are clear. There is no pleural effusion or pneumothorax. The cardiac silhouette is within normal limits. Atherosclerotic calcification of the  tortuous aorta. There is osteopenia with multilevel degenerative changes of the spine and shoulder. Elevated right shoulder, likely related to chronic rotator cuff injury. No acute osseous pathology. IMPRESSION: 1. No active disease. 2. Osseous structures are without acute abnormality. Electronically Signed   By: Anner Crete M.D.   On: 10/27/2018 12:12   Ct Head Wo Contrast  Result Date: 10/27/2018 CLINICAL DATA:  Patient had a fall on Monday EXAM: CT HEAD WITHOUT CONTRAST TECHNIQUE: Contiguous axial images were obtained from the base of the skull through the vertex without intravenous contrast. COMPARISON:  Multiple priors, most recent February 04, 2014. FINDINGS: Brain: No evidence of acute territorial infarction, hemorrhage, hydrocephalus,extra-axial collection or mass lesion/mass effect. There is dilatation the ventricles and sulci consistent with age-related atrophy. Low-attenuation changes in the deep white matter consistent with small vessel ischemia. Vascular: No hyperdense vessel or unexpected calcification. Skull: The skull is intact. No fracture or focal lesion identified. Sinuses/Orbits: The visualized paranasal sinuses and mastoid air cells are clear. The orbits and globes intact. Other: None IMPRESSION: No acute intracranial abnormality. Findings consistent with age related atrophy and chronic small vessel ischemia Electronically Signed   By: Prudencio Pair M.D.   On: 10/27/2018 13:03   Dg C-arm 1-60 Min  Result Date: 10/28/2018 CLINICAL DATA:  Intraop intramedullary nail fixation of femur EXAM: LEFT FEMUR 2 VIEWS; DG C-ARM 61-120 MIN COMPARISON:  None. FINDINGS: Four intraop views were submitted of intramedullary nail fixation of femoral neck fracture. Mildly displaced lesser trochanter fracture fragment is seen. Fluoro time 1 minutes 49 seconds IMPRESSION: Intraop views of intramedullary nail fixation of femoral fracture. Electronically Signed   By: Prudencio Pair M.D.   On: 10/28/2018 16:23    Dg Hip Unilat W Or Wo Pelvis 2-3 Views Left  Result Date: 10/27/2018 CLINICAL DATA:  Unwitnessed fall EXAM: DG HIP (WITH OR WITHOUT PELVIS) 2-3V LEFT COMPARISON:  February 04, 2014. FINDINGS: Comminuted fracture of the left hip through the proximal femur involving the greater and lesser trochanter is seen. There is overlap of fracture fragments. There is mild displacement of the lesser trochanter. The femoral head still appears to be well seated within the acetabulum. There is diffuse osteopenia. Limited visualization of the left inferior pubic rami. Degenerative changes in the lower lumbar spine. IMPRESSION: Comminuted proximal left femoral fracture involving the greater and lesser trochanter. Electronically Signed   By: Prudencio Pair M.D.   On: 10/27/2018 12:11   Dg Femur Min 2 Views Left  Result Date: 10/28/2018 CLINICAL DATA:  Intraop  intramedullary nail fixation of femur EXAM: LEFT FEMUR 2 VIEWS; DG C-ARM 61-120 MIN COMPARISON:  None. FINDINGS: Four intraop views were submitted of intramedullary nail fixation of femoral neck fracture. Mildly displaced lesser trochanter fracture fragment is seen. Fluoro time 1 minutes 49 seconds IMPRESSION: Intraop views of intramedullary nail fixation of femoral fracture. Electronically Signed   By: Prudencio Pair M.D.   On: 10/28/2018 16:23    Cardiac Studies   Echo 10/28/2018  1. The left ventricle has normal systolic function, with an ejection fraction of 55-60%. The cavity size was normal. There is moderate concentric left ventricular hypertrophy. Left ventricular diastolic Doppler parameters are consistent with impaired  relaxation.  2. The right ventricle has normal systolic function. The cavity was normal. There is no increase in right ventricular wall thickness. Right ventricular systolic pressure is mildly elevated with an estimated pressure of 39.1 mmHg.  3. The mitral valve is grossly normal.  4. The tricuspid valve is grossly normal.  5. The aortic  valve is grossly normal. No stenosis of the aortic valve.  6. The aorta is normal unless otherwise noted.  Patient Profile     85 y.o. female with a history of hypertension, DM2, hypothyroidism, anxiety, left breast cancer s/p mastectomy, and who was seen for the evaluation of elevated troponin and perioperative cardiovascular risk assessment, as well as development of short episode of atrial fibrillation.  Assessment & Plan    Atrial fibrillation with RVR, initial episode - Remains in sinus rhythm with ectopy after converting on a diltiazem drip overnight 8/19 -8/20. This was her first episode of atrial fibrillation and brief in length.  Suspect 2/2 significant pain awaiting hip surgery and metabolic abnormalities. No current chest pain or palpitations.  Does report some shortness of breath on [place] O2.  Main complaint is of her hip pain s/p surgery post op day 1. --Continue metoprolol 25mg  BID as HR and BP allow.   --No need for long-term anticoagulation given first episode of atrial fibrillation, brief in length, and without recurrence s/p hip surgery, postop day 1.  If she develops recurrent atrial fibrillation, she will likely require long-term anticoagulation.  Mildly elevated troponin -Likely supply demand ischemia in the setting of severe hip pain while awaiting surgery.  --Echocardiogram without acute findings and normal EF 55 to 60%.  Moderate LVH.  RVSP mildly elevated 39.1 mm hemoglobin. --No further ischemic work-up recommended.    ARF --Continue IVF. Cr improving. K improving with repletion and goal 4.0.  Anemia, likely blood loss --Daily CBC. Hgb 8.1. Likely blood loss as s/p hip surgery. Could be contributing to SOB. Recommend transfuse below 8.0. Per IM.  For questions or updates, please contact [place] Please consult [email or web address] for contact info under        Signed, Arvil Chaco, PA-C  10/29/2018, 11:29 AM

## 2018-10-29 NOTE — Evaluation (Addendum)
Physical Therapy Evaluation Patient Details Name: [name] MRN: [id number] DOB: [birth year] Today's Date: 10/29/2018   History of Present Illness  Ms. [name] is a 85 y.o. s/p L IM nail after a mechanical fall at home. Per chart, the pt fell backwards locking her door and remained on the ground overnight until she was found the next day by her son. According to chart, her son picked her up and put her in her recliner where she stayed until a friend visited the following day and called EMS. In the emergency department, labs were notable for acute kidney injury and elevated creatine kinase consistent with rhabdomyolysis. Pt currently in CCU for cardiac monitoring and mgt of Atrial fibrillation with RVR (now resolved), and mild elevation in troponins. PMH includes: hypertension, DM2, hypothyroidism, anxiety, & left breast cancer s/p mastectomy.  Clinical Impression  Upon evaluation, patient alert and oriented; intermittent tearful and very perseverative, hypersensitive to external stimuli.  Per FACES scale, L hip pain rated 9-10/10 despite pain medication. Poor tolerance for minimal ROM/therex, tolerating only approx 20% normal ROM throughout all planes.  Responds fairly well to distractionary conversation, but quickly regains focus to pain with change of movement pattern.  Unable to tolerate or attempt transition to unsupported sitting this date.  Anticipate very gradual recovery process, heavily dependant on pain control and pain perception.  Will continue to assess/progress as able. Would benefit from skilled PT to address above deficits and promote optimal return to PLOF; recommend transition to STR upon discharge from acute hospitalization.     Follow Up Recommendations SNF    Equipment Recommendations  Rolling walker with 5" wheels;3in1 (PT)    Recommendations for Other Services       Precautions / Restrictions Precautions Precautions: Fall Restrictions Weight Bearing Restrictions:  Yes LLE Weight Bearing: Weight bearing as tolerated      Mobility  Bed Mobility   Bed Mobility: Rolling Rolling: Total assist         General bed mobility comments: total assist for all movement, repositioning  Transfers                 General transfer comment: unable to tolerate/attempt due to pain  Ambulation/Gait             General Gait Details: unable to tolerate/attempt due to pain  Stairs            Wheelchair Mobility    Modified Rankin (Stroke Patients Only)       Balance       Sitting balance - Comments: unable to tolerate/attempt due to pain       Standing balance comment: unable to tolerate/attempt due to pain                             Pertinent Vitals/Pain Pain Assessment: Faces Faces Pain Scale: Hurts worst Pain Location: L hip Pain Descriptors / Indicators: Aching;Guarding;Grimacing;Crying Pain Intervention(s): Limited activity within patient's tolerance;Monitored during session;Premedicated before session;Repositioned    Home Living Family/patient expects to be discharged to:: Private residence Living Arrangements: Alone Available Help at Discharge: Friend(s);Family;Available PRN/intermittently Type of Home: House Home Access: Stairs to enter   Entrance Stairs-Number of Steps: 1 step inside to living room Home Layout: One level;Other (Comment) Home Equipment: Walker - 2 wheels;Cane - single point;Wheelchair - manual      Prior Function Level of Independence: Independent with assistive device(s)         Comments:  Mod indep with [facility]PC for ADLs, household and community mobilization; friends/family assist with grocery shopping and community needs.  Son lives across the street and provides intermittent check in.     Hand Dominance        Extremity/Trunk Assessment   Upper Extremity Assessment Upper Extremity Assessment: Generalized weakness(R shoulder elevation limited all planes (grossly 2+ to  3-/5) due to chronic RTC injury; otherwise, UEs grossly wFL)    Lower Extremity Assessment Lower Extremity Assessment: (bilat LE strength at least 2+ to 3-/5, tolerating very minimal passive/act assist ROM; very limited due to pain.)       Communication   Communication: No difficulties  Cognition Arousal/Alertness: Awake/alert Behavior During Therapy: Anxious Overall Cognitive Status: Within Functional Limits for tasks assessed                                 General Comments: Alert and oriented to basic information; follows commands, but requires increased encouragement.  Very focused, perseverative and hypersensitive to external stimuli of any type.      General Comments      Exercises Other Exercises Other Exercises: Supine L LE therex, 1x10, act assist ROM: ankle pumps, heel slides, hip abduct/adduct.  Hand-over-hand assist for all activities; very guarded, tolerating ROM only through approx 20% normal range.   Assessment/Plan    PT Assessment Patient needs continued PT services  PT Problem List Decreased strength;Decreased range of motion;Decreased activity tolerance;Decreased balance;Decreased mobility;Decreased coordination;Decreased knowledge of use of DME;Decreased safety awareness;Decreased knowledge of precautions;Decreased skin integrity;Pain       PT Treatment Interventions DME instruction;Gait training;Stair training;Functional mobility training;Therapeutic activities;Therapeutic exercise;Patient/family education    PT Goals (Current goals can be found in the Care Plan section)  Acute Rehab PT Goals Patient Stated Goal: To have less pain PT Goal Formulation: With patient/family Time For Goal Achievement: 11/12/18 Potential to Achieve Goals: Fair    Frequency 7X/week   Barriers to discharge        Co-evaluation               AM-PAC PT "6 Clicks" Mobility  Outcome Measure Help needed turning from your back to your side while in a flat  bed without using bedrails?: Total Help needed moving from lying on your back to sitting on the side of a flat bed without using bedrails?: Total Help needed moving to and from a bed to a chair (including a wheelchair)?: Total Help needed standing up from a chair using your arms (e.g., wheelchair or bedside chair)?: Total Help needed to walk in hospital room?: Total Help needed climbing 3-5 steps with a railing? : Total 6 Click Score: 6    End of Session Equipment Utilized During Treatment: Gait belt Activity Tolerance: Patient limited by pain Patient left: in bed;with call bell/phone within reach;with bed alarm set;with family/visitor present Nurse Communication: Mobility status PT Visit Diagnosis: Muscle weakness (generalized) (M62.81);Difficulty in walking, not elsewhere classified (R26.2);Pain Pain - Right/Left: Left Pain - part of body: Hip    Time: [id number] PT Time Calculation (min) (ACUTE ONLY): 28 min   Charges:   PT Evaluation $PT Eval Moderate Complexity: 1 Mod PT Treatments $Therapeutic Exercise: 8-22 mins       Ruqayya Ventress H. Owens Shark, PT, DPT, NCS 10/29/18, 4:25 PM [phone]

## 2018-10-29 NOTE — Anesthesia Postprocedure Evaluation (Signed)
Anesthesia Post Note  Patient: [name]  Procedure(s) Performed: INTRAMEDULLARY (IM) NAIL INTERTROCHANTRIC LEFT LONG (Left Hip)  Patient location during evaluation: ICU Anesthesia Type: Spinal Level of consciousness: awake and responds to stimulation Pain management: pain level controlled Vital Signs Assessment: post-procedure vital signs reviewed and stable Respiratory status: spontaneous breathing, nonlabored ventilation, respiratory function stable and patient connected to nasal cannula oxygen Cardiovascular status: blood pressure returned to baseline and stable Anesthetic complications: no     Last Vitals:  Vitals:   10/29/18 0700 10/29/18 0728  BP: (!) 126/48   Pulse: 62 69  Resp: 18 19  Temp:  36.5 C  SpO2: 94% 96%    Last Pain:  Vitals:   10/29/18 0728  TempSrc: Oral  PainSc: 0-No pain                 Johnna Acosta

## 2018-10-29 NOTE — Progress Notes (Signed)
[facility] NAME: [name]    MR#:  [id number]  DATE OF BIRTH:  1926-12-04  SUBJECTIVE:  CHIEF COMPLAINT:   Chief Complaint  Patient presents with  . Fall   - POD 31 after left hip surgery, remains in normal sinus rhythm - complains of left hip pain  REVIEW OF SYSTEMS:  Review of Systems  Constitutional: Positive for malaise/fatigue. Negative for chills and fever.  HENT: Negative for congestion, ear discharge, hearing loss and nosebleeds.   Eyes: Negative for blurred vision and double vision.  Respiratory: Negative for cough, shortness of breath and wheezing.   Cardiovascular: Negative for chest pain and palpitations.  Gastrointestinal: Negative for abdominal pain, constipation, diarrhea, nausea and vomiting.  Genitourinary: Negative for dysuria.  Musculoskeletal: Positive for joint pain and myalgias.  Neurological: Negative for dizziness, focal weakness, seizures and headaches.  Psychiatric/Behavioral: Negative for depression.    DRUG ALLERGIES:   Allergies  Allergen Reactions  . Amlodipine Other (See Comments)    Leg edema   . Atenolol Other (See Comments)    REACTION: bradycardia  . Erythromycin Base Other (See Comments)    REACTION: stomach upset  . Lisinopril Other (See Comments)    REACTION: cough  (20 mg)    VITALS:  Blood pressure (!) 126/48, pulse 69, temperature 97.7 F (36.5 C), temperature source Oral, resp. rate 19, height 5\' 3"  (1.6 m), weight 52.7 kg, SpO2 96 %.  PHYSICAL EXAMINATION:  Physical Exam   GENERAL:  85 y.o.-year-old patient lying in the bed with no acute distress.  EYES: Pupils equal, round, reactive to light and accommodation. No scleral icterus. Extraocular muscles intact.  HEENT: Head atraumatic, normocephalic. Oropharynx and nasopharynx clear.  NECK:  Supple, no jugular venous distention. No thyroid enlargement, no tenderness.  LUNGS: Normal breath sounds bilaterally, no  wheezing, rales,rhonchi or crepitation. No use of accessory muscles of respiration. Decreased bibasilar breath sounds S/p left mastectomy CARDIOVASCULAR: S1, S2 normal. No  rubs, or gallops. 3/6 systolic murmur present ABDOMEN: Soft, nontender, nondistended. Bowel sounds present. No organomegaly or mass.  EXTREMITIES: No pedal edema, cyanosis, or clubbing. Left hip dressing in place NEUROLOGIC: Cranial nerves II through XII are intact. Muscle strength good in all extremities except limited left hip flexion due to pain. Sensation intact. Gait not checked.  PSYCHIATRIC: The patient is alert and oriented x 3.  SKIN: No obvious rash, lesion, or ulcer.    LABORATORY PANEL:   CBC Recent Labs  Lab 10/29/18 0308  WBC 11.0*  HGB 8.1*  HCT 25.0*  PLT 195   ------------------------------------------------------------------------------------------------------------------  Chemistries  Recent Labs  Lab 10/27/18 1115  10/28/18 0746 10/29/18 0308  NA 138   < >  --  138  K 3.6   < >  --  3.8  CL 102   < >  --  110  CO2 25   < >  --  23  GLUCOSE 149*   < >  --  113*  BUN 45*   < >  --  44*  CREATININE 2.11*   < >  --  1.59*  CALCIUM 9.1   < >  --  7.8*  MG  --   --  2.1  --   AST 72*  --   --   --   ALT 37  --   --   --   ALKPHOS 58  --   --   --  BILITOT 1.4*  --   --   --    < > = values in this interval not displayed.   ------------------------------------------------------------------------------------------------------------------  Cardiac Enzymes No results for input(s): TROPONINI in the last 168 hours. ------------------------------------------------------------------------------------------------------------------  RADIOLOGY:  Dg Chest 1 View  Result Date: 10/27/2018 CLINICAL DATA:  85 year old female with fall. EXAM: CHEST  1 VIEW COMPARISON:  Chest radiograph dated 02/04/2014 FINDINGS: The lungs are clear. There is no pleural effusion or pneumothorax. The cardiac  silhouette is within normal limits. Atherosclerotic calcification of the tortuous aorta. There is osteopenia with multilevel degenerative changes of the spine and shoulder. Elevated right shoulder, likely related to chronic rotator cuff injury. No acute osseous pathology. IMPRESSION: 1. No active disease. 2. Osseous structures are without acute abnormality. Electronically Signed   By: Anner Crete M.D.   On: 10/27/2018 12:12   Ct Head Wo Contrast  Result Date: 10/27/2018 CLINICAL DATA:  Patient had a fall on Monday EXAM: CT HEAD WITHOUT CONTRAST TECHNIQUE: Contiguous axial images were obtained from the base of the skull through the vertex without intravenous contrast. COMPARISON:  Multiple priors, most recent February 04, 2014. FINDINGS: Brain: No evidence of acute territorial infarction, hemorrhage, hydrocephalus,extra-axial collection or mass lesion/mass effect. There is dilatation the ventricles and sulci consistent with age-related atrophy. Low-attenuation changes in the deep white matter consistent with small vessel ischemia. Vascular: No hyperdense vessel or unexpected calcification. Skull: The skull is intact. No fracture or focal lesion identified. Sinuses/Orbits: The visualized paranasal sinuses and mastoid air cells are clear. The orbits and globes intact. Other: None IMPRESSION: No acute intracranial abnormality. Findings consistent with age related atrophy and chronic small vessel ischemia Electronically Signed   By: Prudencio Pair M.D.   On: 10/27/2018 13:03   Dg C-arm 1-60 Min  Result Date: 10/28/2018 CLINICAL DATA:  Intraop intramedullary nail fixation of femur EXAM: LEFT FEMUR 2 VIEWS; DG C-ARM 61-120 MIN COMPARISON:  None. FINDINGS: Four intraop views were submitted of intramedullary nail fixation of femoral neck fracture. Mildly displaced lesser trochanter fracture fragment is seen. Fluoro time 1 minutes 49 seconds IMPRESSION: Intraop views of intramedullary nail fixation of femoral  fracture. Electronically Signed   By: Prudencio Pair M.D.   On: 10/28/2018 16:23   Dg Hip Unilat W Or Wo Pelvis 2-3 Views Left  Result Date: 10/27/2018 CLINICAL DATA:  Unwitnessed fall EXAM: DG HIP (WITH OR WITHOUT PELVIS) 2-3V LEFT COMPARISON:  February 04, 2014. FINDINGS: Comminuted fracture of the left hip through the proximal femur involving the greater and lesser trochanter is seen. There is overlap of fracture fragments. There is mild displacement of the lesser trochanter. The femoral head still appears to be well seated within the acetabulum. There is diffuse osteopenia. Limited visualization of the left inferior pubic rami. Degenerative changes in the lower lumbar spine. IMPRESSION: Comminuted proximal left femoral fracture involving the greater and lesser trochanter. Electronically Signed   By: Prudencio Pair M.D.   On: 10/27/2018 12:11   Dg Femur Min 2 Views Left  Result Date: 10/28/2018 CLINICAL DATA:  Intraop intramedullary nail fixation of femur EXAM: LEFT FEMUR 2 VIEWS; DG C-ARM 61-120 MIN COMPARISON:  None. FINDINGS: Four intraop views were submitted of intramedullary nail fixation of femoral neck fracture. Mildly displaced lesser trochanter fracture fragment is seen. Fluoro time 1 minutes 49 seconds IMPRESSION: Intraop views of intramedullary nail fixation of femoral fracture. Electronically Signed   By: Prudencio Pair M.D.   On: 10/28/2018 16:23    EKG:  Orders placed or performed during the hospital encounter of 10/27/18  . ED EKG  . EKG 12-Lead  . EKG 12-Lead  . ED EKG  . EKG 12-Lead  . EKG 12-Lead  . EKG 12-Lead  . EKG 12-Lead  . EKG 12-Lead  . EKG 12-Lead    ASSESSMENT AND PLAN:   [name] with PMH significant for breast cancer, HTN, arthritis, hypothyroidism brought from home secondary to fall and left hip pain. Was noted to have afib in ED  1. Transient afib with rvr- converted to NSR with IV cardizem, not on any drips now - appreciate cardiology consult - continue  oral metoprolol - No indication for long term anti coagulation Likely triggered by pain from Hip fracture  2. Fall and left hip intertrochanteric fracture- appreciate ortho consult - s/p ORIF with Im nail - POD #1 today, pain control - PT consult and likely assess for rehab placement - monitor post op hb  3. ARF- from fall and rhabdo- improving with IV fluids  4. Elevated troponins- demand ischemia, not acute MI - no further cardiac work up recommended  5. Acute on chronic anemia- acute post op blood loss anemia, no indication for transfusion eyt. Monitor for now  6. DVT prophylaxis- on lovenox   PT consult   All the records are reviewed and case discussed with Care Management/Social Workerr. Management plans discussed with the patient, family and they are in agreement.  CODE STATUS: Full Code  TOTAL TIME TAKING CARE OF THIS PATIENT: 38 minutes.   POSSIBLE D/C IN 2-3 DAYS, DEPENDING ON CLINICAL CONDITION.   Gladstone Lighter M.D on 10/29/2018 at 8:13 AM  Between 7am to 6pm - Pager - [phone]  After 6pm go to [email or web address] - password [email or web address] [facility]  Office  [phone]  CC: Primary care physician; McLean-Scocuzza, Nino Glow, MD

## 2018-10-30 LAB — CBC
HCT: 26.1 % — ABNORMAL LOW (ref 36.0–46.0)
Hemoglobin: 8.4 g/dL — ABNORMAL LOW (ref 12.0–15.0)
MCH: 30.3 pg (ref 26.0–34.0)
MCHC: 32.2 g/dL (ref 30.0–36.0)
MCV: 94.2 fL (ref 80.0–100.0)
Platelets: 205 10*3/uL (ref 150–400)
RBC: 2.77 MIL/uL — ABNORMAL LOW (ref 3.87–5.11)
RDW: 14.4 % (ref 11.5–15.5)
WBC: 10.8 10*3/uL — ABNORMAL HIGH (ref 4.0–10.5)
nRBC: 0 % (ref 0.0–0.2)

## 2018-10-30 LAB — GLUCOSE, CAPILLARY
Glucose-Capillary: 90 mg/dL (ref 70–99)
Glucose-Capillary: 115 mg/dL — ABNORMAL HIGH (ref 70–99)
Glucose-Capillary: 118 mg/dL — ABNORMAL HIGH (ref 70–99)
Glucose-Capillary: 96 mg/dL (ref 70–99)

## 2018-10-30 LAB — BASIC METABOLIC PANEL
Anion gap: 7 (ref 5–15)
BUN: 36 mg/dL — ABNORMAL HIGH (ref 8–23)
CO2: 25 mmol/L (ref 22–32)
Calcium: 8.5 mg/dL — ABNORMAL LOW (ref 8.9–10.3)
Chloride: 106 mmol/L (ref 98–111)
Creatinine, Ser: 1.13 mg/dL — ABNORMAL HIGH (ref 0.44–1.00)
GFR calc Af Amer: 50 mL/min — ABNORMAL LOW (ref >60)
GFR calc non Af Amer: 43 mL/min — ABNORMAL LOW (ref >60)
Glucose, Bld: 95 mg/dL (ref 70–99)
Potassium: 4.1 mmol/L (ref 3.5–5.1)
Sodium: 138 mmol/L (ref 135–145)

## 2018-10-30 LAB — MAGNESIUM: Magnesium: 2 mg/dL (ref 1.7–2.4)

## 2018-10-30 NOTE — Progress Notes (Signed)
Physical Therapy Treatment Patient Details Name: [name] MRN: [id number] DOB: 1926/04/21 Today's Date: 10/30/2018    History of Present Illness [name] is a 85 y.o. s/p L IM nail after a mechanical fall at home. Per chart, the pt fell backwards locking her door and remained on the ground overnight until she was found the next day by her son. According to chart, her son picked her up and put her in her recliner where she stayed until a friend visited the following day and called EMS. In the emergency department, labs were notable for acute kidney injury and elevated creatine kinase consistent with rhabdomyolysis. Pt currently in CCU for cardiac monitoring and mgt of Atrial fibrillation with RVR (now resolved), and mild elevation in troponins. PMH includes: hypertension, DM2, hypothyroidism, anxiety, & left breast cancer s/p mastectomy.    PT Comments    Pt transferred from ICU to ortho floor.  Pt in bed. Remains very guarded with attempts at [facility].  Only tolerates minimal activity despite encouragement and very slow movements.  She does allow some progression of movement but remains guarded.  She is unable to tolerate attempts to removed pillow from under her legs during exercises.  OOB deferred at this time due to pain and pt and staff safety as he ability to assist is minimal to none.   Follow Up Recommendations  SNF     Equipment Recommendations  Rolling walker with 5" wheels;3in1 (PT)    Recommendations for Other Services       Precautions / Restrictions Precautions Precautions: Fall Restrictions Weight Bearing Restrictions: Yes LLE Weight Bearing: Weight bearing as tolerated Other Position/Activity Restrictions: Per nursing orders, pt is WBAT on LLE.    Mobility  Bed Mobility               General bed mobility comments: deferred due to pain and inability for pt to assist.  Transfers                 General transfer comment: unable to tolerate/attempt due  to pain  Ambulation/Gait             General Gait Details: unable to tolerate/attempt due to pain   Stairs             Wheelchair Mobility    Modified Rankin (Stroke Patients Only)       Balance                                            Cognition Arousal/Alertness: Awake/alert Behavior During Therapy: Anxious Overall Cognitive Status: Within Functional Limits for tasks assessed                                        Exercises Other Exercises Other Exercises: Supine L LE therex, 1x10, act assist ROM: ankle pumps, heel slides, hip abduct/adduct.  Hand-over-hand assist for all activities; very guarded, tolerating some inc ROM today but still very limited.    General Comments        Pertinent Vitals/Pain Pain Assessment: Faces Faces Pain Scale: Hurts whole lot Pain Location: L hip Pain Descriptors / Indicators: Aching;Guarding;Grimacing Pain Intervention(s): Limited activity within patient's tolerance;Monitored during session    Home Living  Prior Function            PT Goals (current goals can now be found in the care plan section) Progress towards PT goals: Progressing toward goals    Frequency    7X/week      PT Plan Current plan remains appropriate    Co-evaluation              AM-PAC PT "6 Clicks" Mobility   Outcome Measure  Help needed turning from your back to your side while in a flat bed without using bedrails?: Total Help needed moving from lying on your back to sitting on the side of a flat bed without using bedrails?: Total Help needed moving to and from a bed to a chair (including a wheelchair)?: Total Help needed standing up from a chair using your arms (e.g., wheelchair or bedside chair)?: Total Help needed to walk in hospital room?: Total Help needed climbing 3-5 steps with a railing? : Total 6 Click Score: 6    End of Session   Activity Tolerance:  Patient limited by pain Patient left: in bed;with call bell/phone within reach;with bed alarm set   Pain - Right/Left: Left Pain - part of body: Hip     Time: [id number] PT Time Calculation (min) (ACUTE ONLY): 12 min  Charges:  $Therapeutic Exercise: 8-22 mins                    [name], PTA 10/30/18, 11:39 AM

## 2018-10-30 NOTE — Progress Notes (Signed)
Occupational Therapy Treatment Patient Details Name: [name] MRN: [id number] DOB: 08-04-1926 Today's Date: 10/30/2018    History of present illness [name] is a 85 y.o. s/p L IM nail after a mechanical fall at home. Per chart, the pt fell backwards locking her door and remained on the ground overnight until she was found the next day by her son. According to chart, her son picked her up and put her in her recliner where she stayed until a friend visited the following day and called EMS. In the emergency department, labs were notable for acute kidney injury and elevated creatine kinase consistent with rhabdomyolysis. Pt currently in CCU for cardiac monitoring and mgt of Atrial fibrillation with RVR (now resolved), and mild elevation in troponins. PMH includes: hypertension, DM2, hypothyroidism, anxiety, & left breast cancer s/p mastectomy.   OT comments  Pt seen for OT tx session, limited by significant L hip pain with any movement. Despite instruction and change in bed positioning to improve pt's ability to participate in bed mobility, pt required Max A x2 to help slide her up and reposition in the bed to improve comfort/pain relief and support skin integrity. Pt educated in importance of repositioning for pressure relief and benefit of movement for her recovery. Pt verbalized understanding, but significantly limited by L hip pain and difficult to redirect to other topics of conversation.    Follow Up Recommendations  SNF;Supervision/Assistance - 24 hour    Equipment Recommendations  3 in 1 bedside commode    Recommendations for Other Services      Precautions / Restrictions Precautions Precautions: Fall Restrictions Weight Bearing Restrictions: Yes LLE Weight Bearing: Weight bearing as tolerated Other Position/Activity Restrictions: Per nursing orders, pt is WBAT on LLE.       Mobility Bed Mobility Overal bed mobility: Needs Assistance             General bed mobility  comments: Max A x2 to help slide up and reposition in the bed to improve comfort/pain relief and support skin integrity  Transfers                 General transfer comment: unable to tolerate/attempt due to pain    Balance                                           ADL either performed or assessed with clinical judgement   ADL                                         General ADL Comments: ADL assessment very limited due to pt pain level on this date. Pt likely near max assist for LB adl mgt. Set-up to min assist for self feeding. Functional mobility deferred 2/2 pain. Will continue to assess.     Vision Baseline Vision/History: Macular Degeneration Patient Visual Report: No change from baseline     Perception     Praxis      Cognition Arousal/Alertness: Awake/alert Behavior During Therapy: Anxious Overall Cognitive Status: No family/caregiver present to determine baseline cognitive functioning                                 General Comments: Pt tearful, appears  anxious, requires cues to redirect/distract        Exercises Other Exercises Other Exercises: Pt educated in positioning and importance of repositioning to minimize risk of skin breakdown/pressure wounds   Shoulder Instructions       General Comments      Pertinent Vitals/ Pain       Pain Assessment: Faces Faces Pain Scale: Hurts whole lot Pain Location: L hip with any movement Pain Descriptors / Indicators: Aching;Guarding;Grimacing Pain Intervention(s): Limited activity within patient's tolerance;Monitored during session;Repositioned;Patient requesting pain meds-RN notified  Home Living                                          Prior Functioning/Environment              Frequency  Min 1X/week        Progress Toward Goals  OT Goals(current goals can now be found in the care plan section)  Progress towards OT goals:  OT to reassess next treatment  Acute Rehab OT Goals Patient Stated Goal: To have less pain OT Goal Formulation: With patient Time For Goal Achievement: 11/12/18 Potential to Achieve Goals: Good  Plan Discharge plan remains appropriate;Frequency remains appropriate    Co-evaluation                 AM-PAC OT "6 Clicks" Daily Activity     Outcome Measure   Help from another person eating meals?: A Little Help from another person taking care of personal grooming?: A Little Help from another person toileting, which includes using toliet, bedpan, or urinal?: Total Help from another person bathing (including washing, rinsing, drying)?: Total Help from another person to put on and taking off regular upper body clothing?: A Little Help from another person to put on and taking off regular lower body clothing?: Total 6 Click Score: 12    End of Session    OT Visit Diagnosis: Other abnormalities of gait and mobility (R26.89);Muscle weakness (generalized) (M62.81);History of falling (Z91.81);Pain Pain - Right/Left: Left Pain - part of body: Hip   Activity Tolerance Patient limited by pain   Patient Left in bed;with call bell/phone within reach;with bed alarm set   Nurse Communication Patient requests pain meds        Time: [id number] OT Time Calculation (min): 11 min  Charges: OT General Charges $OT Visit: 1 Visit OT Treatments $Therapeutic Activity: 8-22 mins  Jeni Salles, MPH, MS, OTR/L ascom [phone] 10/30/18, 1:35 PM

## 2018-10-30 NOTE — Progress Notes (Signed)
[facility] NAME: [name]    MR#:  [id number]  DATE OF BIRTH:  [birth year]  SUBJECTIVE:  CHIEF COMPLAINT:   Chief Complaint  Patient presents with  . Fall   - POD #2  after left hip surgery, remains in normal sinus rhythm - complains of significant left hip pain  REVIEW OF SYSTEMS:  Review of Systems  Constitutional: Positive for malaise/fatigue. Negative for chills and fever.  HENT: Negative for congestion, ear discharge, hearing loss and nosebleeds.   Eyes: Negative for blurred vision and double vision.  Respiratory: Negative for cough, shortness of breath and wheezing.   Cardiovascular: Negative for chest pain and palpitations.  Gastrointestinal: Negative for abdominal pain, constipation, diarrhea, nausea and vomiting.  Genitourinary: Negative for dysuria.  Musculoskeletal: Positive for joint pain and myalgias.  Neurological: Negative for dizziness, focal weakness, seizures and headaches.  Psychiatric/Behavioral: Negative for depression.    DRUG ALLERGIES:   Allergies  Allergen Reactions  . Amlodipine Other (See Comments)    Leg edema   . Atenolol Other (See Comments)    REACTION: bradycardia  . Erythromycin Base Other (See Comments)    REACTION: stomach upset  . Lisinopril Other (See Comments)    REACTION: cough  (20 mg)    VITALS:  Blood pressure (!) 157/60, pulse 62, temperature 99.1 F (37.3 C), temperature source Oral, resp. rate 18, height 5\' 3"  (1.6 m), weight 52.7 kg, SpO2 100 %.  PHYSICAL EXAMINATION:  Physical Exam   GENERAL:  85 y.o.-year-old patient lying in the bed with no acute distress.  EYES: Pupils equal, round, reactive to light and accommodation. No scleral icterus. Extraocular muscles intact.  HEENT: Head atraumatic, normocephalic. Oropharynx and nasopharynx clear.  NECK:  Supple, no jugular venous distention. No thyroid enlargement, no tenderness.  LUNGS: Normal breath sounds  bilaterally, no wheezing, rales,rhonchi or crepitation. No use of accessory muscles of respiration. Decreased bibasilar breath sounds S/p left mastectomy CARDIOVASCULAR: S1, S2 normal. No  rubs, or gallops. 3/6 systolic murmur present ABDOMEN: Soft, nontender, nondistended. Bowel sounds present. No organomegaly or mass.  EXTREMITIES: No pedal edema, cyanosis, or clubbing. Left hip dressing in place, there is swelling and tenderness around the proximal honey comb dressing NEUROLOGIC: Cranial nerves II through XII are intact. Muscle strength good in all extremities except limited left hip flexion due to pain. Sensation intact. Gait not checked.  PSYCHIATRIC: The patient is alert and oriented x 3.  SKIN: No obvious rash, lesion, or ulcer.    LABORATORY PANEL:   CBC Recent Labs  Lab 10/30/18 0441  WBC 10.8*  HGB 8.4*  HCT 26.1*  PLT 205   ------------------------------------------------------------------------------------------------------------------  Chemistries  Recent Labs  Lab 10/27/18 1115  10/30/18 0441  NA 138   < > 138  K 3.6   < > 4.1  CL 102   < > 106  CO2 25   < > 25  GLUCOSE 149*   < > 95  BUN 45*   < > 36*  CREATININE 2.11*   < > 1.13*  CALCIUM 9.1   < > 8.5*  MG  --    < > 2.0  AST 72*  --   --   ALT 37  --   --   ALKPHOS 58  --   --   BILITOT 1.4*  --   --    < > = values in this interval not displayed.   ------------------------------------------------------------------------------------------------------------------  Cardiac Enzymes No results for input(s): TROPONINI in the last 168 hours. ------------------------------------------------------------------------------------------------------------------  RADIOLOGY:  Dg C-arm 1-60 Min  Result Date: 10/28/2018 CLINICAL DATA:  Intraop intramedullary nail fixation of femur EXAM: LEFT FEMUR 2 VIEWS; DG C-ARM 61-120 MIN COMPARISON:  None. FINDINGS: Four intraop views were submitted of intramedullary nail  fixation of femoral neck fracture. Mildly displaced lesser trochanter fracture fragment is seen. Fluoro time 1 minutes 49 seconds IMPRESSION: Intraop views of intramedullary nail fixation of femoral fracture. Electronically Signed   By: Prudencio Pair M.D.   On: 10/28/2018 16:23   Dg Femur Min 2 Views Left  Result Date: 10/28/2018 CLINICAL DATA:  Intraop intramedullary nail fixation of femur EXAM: LEFT FEMUR 2 VIEWS; DG C-ARM 61-120 MIN COMPARISON:  None. FINDINGS: Four intraop views were submitted of intramedullary nail fixation of femoral neck fracture. Mildly displaced lesser trochanter fracture fragment is seen. Fluoro time 1 minutes 49 seconds IMPRESSION: Intraop views of intramedullary nail fixation of femoral fracture. Electronically Signed   By: Prudencio Pair M.D.   On: 10/28/2018 16:23    EKG:   Orders placed or performed during the hospital encounter of 10/27/18  . ED EKG  . EKG 12-Lead  . EKG 12-Lead  . ED EKG  . EKG 12-Lead  . EKG 12-Lead  . EKG 12-Lead  . EKG 12-Lead  . EKG 12-Lead  . EKG 12-Lead    ASSESSMENT AND PLAN:   [name] with PMH significant for breast cancer, HTN, arthritis, hypothyroidism brought from home secondary to fall and left hip pain. Was noted to have afib in ED  1. Transient afib with rvr- converted to NSR with IV cardizem, not on any drips now - appreciate cardiology consult - continue oral metoprolol - No indication for long term anti coagulation if remains in normal sinus rhythm -Continue cardiac monitoring for now  2. Fall and left hip intertrochanteric fracture- appreciate ortho consult - s/p ORIF with Im nail - POD # 2 today, pain control - PT consulted -Foley to be discontinued today  3. ARF- from fall and rhabdo- improving with IV fluids -Encourage oral nutrition, DC IV fluids today  4. Elevated troponins- demand ischemia, not acute MI - no further cardiac work up recommended  5. Acute on chronic anemia- acute post op blood loss  anemia, no indication for transfusion yet. Monitor for now -Keep hemoglobin greater than 8  6. DVT prophylaxis- on lovenox   PT consult-Will need rehab at discharge.  Consult social worker   All the records are reviewed and case discussed with Care Management/Social Workerr. Management plans discussed with the patient, family and they are in agreement.  CODE STATUS: Full Code  TOTAL TIME TAKING CARE OF THIS PATIENT: 38 minutes.   POSSIBLE D/C IN 2-3 DAYS, DEPENDING ON CLINICAL CONDITION.   Gladstone Lighter M.D on 10/30/2018 at 10:31 AM  Between 7am to 6pm - Pager - [phone]  After 6pm go to [email or web address] - password [email or web address] [facility]  Office  [phone]  CC: Primary care physician; McLean-Scocuzza, Nino Glow, MD

## 2018-10-30 NOTE — Progress Notes (Signed)
   Subjective: 2 Days Post-Op Procedure(s) (LRB): INTRAMEDULLARY (IM) NAIL INTERTROCHANTRIC LEFT LONG (Left) Patient reports pain as mild to moderate Patient is well, and has had no acute complaints or problems Denies any CP, SOB, ABD pain. We will continue therapy today.  Plan is to go Skilled nursing facility after hospital stay.  Objective: Vital signs in last 24 hours: Temp:  [97.5 F (36.4 C)-99.1 F (37.3 C)] 99.1 F (37.3 C) (08/22 0812) Pulse Rate:  [53-75] 62 (08/22 0812) Resp:  [11-26] 18 (08/22 0600) BP: (91-159)/(39-83) 157/60 (08/22 0812) SpO2:  [93 %-100 %] 100 % (08/22 0812)  Intake/Output from previous day: 08/21 0701 - 08/22 0700 In: 1163.1 [I.V.:1163.1] Out: 1055 [Urine:1055] Intake/Output this shift: No intake/output data recorded.  Recent Labs    10/27/18 1115 10/28/18 0411 10/29/18 0308 10/30/18 0441  HGB 10.8* 9.5* 8.1* 8.4*   Recent Labs    10/29/18 0308 10/30/18 0441  WBC 11.0* 10.8*  RBC 2.66* 2.77*  HCT 25.0* 26.1*  PLT 195 205   Recent Labs    10/29/18 0308 10/30/18 0441  NA 138 138  K 3.8 4.1  CL 110 106  CO2 23 25  BUN 44* 36*  CREATININE 1.59* 1.13*  GLUCOSE 113* 95  CALCIUM 7.8* 8.5*   Recent Labs    10/28/18 0746  INR 1.1    EXAM General - Patient is Alert, Appropriate and Oriented Extremity - Neurovascular intact Sensation intact distally Intact pulses distally Dorsiflexion/Plantar flexion intact No cellulitis present Compartment soft Dressing - dressing C/D/I and no drainage Motor Function - intact, moving foot and toes well on exam.   Past Medical History:  Diagnosis Date  . Anxiety 12/2002  . Arthritis    hands  . Cancer ([place])    breast left arm  . Cancer ([place])    uterine  . Diabetes mellitus 11/2004   Type 2  . Dyspnea    WITH EXERTION  . GERD (gastroesophageal reflux disease)   . Hyperlipidemia 11/2004  . Hypertension 12/2002  . Hypothyroidism   . Osteopenia 02/2002   02/2002 Dexa  osteopenia// Dexa 02/26/2004 (Dr. Ubaldo Glassing) stable/IMR femur/spine  . Spinal stenosis   . 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 Ronnald Collum endocrinologist  . Vertigo     Assessment/Plan:   2 Days Post-Op Procedure(s) (LRB): INTRAMEDULLARY (IM) NAIL INTERTROCHANTRIC LEFT LONG (Left) Active Problems:   Comminuted fracture of left hip (HCC)  Estimated body mass index is 20.58 kg/m as calculated from the following:   Height as of this encounter: 5\' 3"  (1.6 m).   Weight as of this encounter: 52.7 kg. Advance diet Up with therapy  Needs BM Acute post op blood loss anemia - Hgb 8.4.  Hemoglobin stable, trending up.  Continue with iron supplement CM to assist with discharge to skilled nursing facility  Patient will need Lovenox 40 mg subcu daily x14 days at discharge TED hose bilateral lower extremity x6 weeks Follow-up with [facility] orthopedics in 2 weeks for staple removal   DVT Prophylaxis - Lovenox, TED hose and SCDs Weight-Bearing as tolerated to left leg   T. Rachelle Hora, PA-C [place] 10/30/2018, 8:25 AM

## 2018-10-30 NOTE — Progress Notes (Signed)
Pharmacy Electrolyte Monitoring Consult:  Pharmacy consulted to assist in monitoring and replacing electrolytes in this 85 y.o. female admitted on 10/27/2018 with Fall   Labs:  Sodium  Date Value  10/30/2018 138 mmol/L  02/04/2014 140 mmol/L  11/15/2009 139 mEq/L   Potassium  Date Value  10/30/2018 4.1 mmol/L  02/04/2014 3.9 mmol/L  11/15/2009 3.9 mEq/L   Magnesium (mg/dL)  Date Value  10/30/2018 2.0   Phosphorus (mg/dL)  Date Value  04/30/2010 3.8   Calcium (mg/dL)  Date Value  10/30/2018 8.5 (L)  11/15/2009 10.1   Calcium, Total (mg/dL)  Date Value  02/04/2014 8.9   Albumin (g/dL)  Date Value  10/27/2018 3.8  02/04/2014 3.6    Assessment/Plan: Patient receiving LR at 75mL/hr.   Continue Calcium Carbonate 2 tabs BID. No additional replacement warranted at this time.   Obtain BMP/Magnesium with am labs.   Will replace for goal potassium ~ 4, goal magnesium ~ 2, and goal calcium > 8.   Pharmacy will continue to monitor and adjust per consult.    Pernell Dupre, PharmD, BCPS Clinical Pharmacist 10/30/2018 5:33 AM

## 2018-10-31 LAB — CBC
HCT: 25.1 % — ABNORMAL LOW (ref 36.0–46.0)
Hemoglobin: 8.1 g/dL — ABNORMAL LOW (ref 12.0–15.0)
MCH: 30.5 pg (ref 26.0–34.0)
MCHC: 32.3 g/dL (ref 30.0–36.0)
MCV: 94.4 fL (ref 80.0–100.0)
Platelets: 235 10*3/uL (ref 150–400)
RBC: 2.66 MIL/uL — ABNORMAL LOW (ref 3.87–5.11)
RDW: 14.3 % (ref 11.5–15.5)
WBC: 8.9 10*3/uL (ref 4.0–10.5)
nRBC: 0 % (ref 0.0–0.2)

## 2018-10-31 LAB — BASIC METABOLIC PANEL
Anion gap: 4 — ABNORMAL LOW (ref 5–15)
BUN: 27 mg/dL — ABNORMAL HIGH (ref 8–23)
CO2: 26 mmol/L (ref 22–32)
Calcium: 8.9 mg/dL (ref 8.9–10.3)
Chloride: 108 mmol/L (ref 98–111)
Creatinine, Ser: 1.06 mg/dL — ABNORMAL HIGH (ref 0.44–1.00)
GFR calc Af Amer: 54 mL/min — ABNORMAL LOW (ref >60)
GFR calc non Af Amer: 47 mL/min — ABNORMAL LOW (ref >60)
Glucose, Bld: 102 mg/dL — ABNORMAL HIGH (ref 70–99)
Potassium: 4.2 mmol/L (ref 3.5–5.1)
Sodium: 138 mmol/L (ref 135–145)

## 2018-10-31 LAB — GLUCOSE, CAPILLARY
Glucose-Capillary: 86 mg/dL (ref 70–99)
Glucose-Capillary: 99 mg/dL (ref 70–99)
Glucose-Capillary: 117 mg/dL — ABNORMAL HIGH (ref 70–99)
Glucose-Capillary: 109 mg/dL — ABNORMAL HIGH (ref 70–99)

## 2018-10-31 LAB — MAGNESIUM: Magnesium: 1.9 mg/dL (ref 1.7–2.4)

## 2018-10-31 MED ORDER — LOSARTAN POTASSIUM 50 MG PO TABS
100.0000 mg | ORAL_TABLET | Freq: Every day | ORAL | Status: DC
  Administered 2018-10-31 – 2018-11-03 (×4): 100 mg via ORAL
  Filled 2018-10-31 (×4): qty 2

## 2018-10-31 NOTE — Progress Notes (Signed)
MD notified of pt's B/P, 164/61.

## 2018-10-31 NOTE — Progress Notes (Signed)
Physical Therapy Treatment Patient Details Name: [name] MRN: [id number] DOB: 1927-01-25 Today's Date: 10/31/2018    History of Present Illness [name] is a 85 y.o. s/p L IM nail after a mechanical fall at home. Per chart, the pt fell backwards locking her door and remained on the ground overnight until she was found the next day by her son. According to chart, her son picked her up and put her in her recliner where she stayed until a friend visited the following day and called EMS. In the emergency department, labs were notable for acute kidney injury and elevated creatine kinase consistent with rhabdomyolysis. Pt currently in CCU for cardiac monitoring and mgt of Atrial fibrillation with RVR (now resolved), and mild elevation in troponins. PMH includes: hypertension, DM2, hypothyroidism, anxiety, & left breast cancer s/p mastectomy.    PT Comments    Feeling better.  Participated in exercises as described below.  She was able to sit EOB with max a x 1 for transitions and min guard once seated.  She remained up for 2 minutes before needing to return to supine due to dizziness.  Felt better once supine but pain remains limiting factor.  Unable to take stronger pain meds at this time due to BP.   Follow Up Recommendations  SNF     Equipment Recommendations  Rolling walker with 5" wheels;3in1 (PT)    Recommendations for Other Services       Precautions / Restrictions Precautions Precautions: Fall Restrictions Weight Bearing Restrictions: Yes LLE Weight Bearing: Weight bearing as tolerated Other Position/Activity Restrictions: Per nursing orders, pt is WBAT on LLE.    Mobility  Bed Mobility Overal bed mobility: Needs Assistance Bed Mobility: Supine to Sit;Sit to Supine Rolling: Max assist   Supine to sit: Max assist Sit to supine: Max assist      Transfers                 General transfer comment: unable to tolerate/attempt due to pain  Ambulation/Gait                  Stairs             Wheelchair Mobility    Modified Rankin (Stroke Patients Only)       Balance Overall balance assessment: Needs assistance Sitting-balance support: Bilateral upper extremity supported Sitting balance-Leahy Scale: Fair Sitting balance - Comments: min gaurd for safety       Standing balance comment: unable to tolerate/attempt due to pain                            Cognition Arousal/Alertness: Awake/alert Behavior During Therapy: WFL for tasks assessed/performed Overall Cognitive Status: Within Functional Limits for tasks assessed                                        Exercises Other Exercises Other Exercises: Supine L LE therex, 1x10, act assist ROM: ankle pumps, heel slides, hip abduct/adduct.  Hand-over-hand assist for all activities; very guarded, tolerating some inc ROM today but still very limited.    General Comments        Pertinent Vitals/Pain Pain Assessment: Faces Faces Pain Scale: Hurts worst Pain Location: L hip with any movement Pain Descriptors / Indicators: Aching;Guarding;Grimacing    Home Living  Prior Function            PT Goals (current goals can now be found in the care plan section) Progress towards PT goals: Progressing toward goals    Frequency    7X/week      PT Plan Current plan remains appropriate    Co-evaluation              AM-PAC PT "6 Clicks" Mobility   Outcome Measure  Help needed turning from your back to your side while in a flat bed without using bedrails?: Total Help needed moving from lying on your back to sitting on the side of a flat bed without using bedrails?: Total Help needed moving to and from a bed to a chair (including a wheelchair)?: Total Help needed standing up from a chair using your arms (e.g., wheelchair or bedside chair)?: Total Help needed to walk in hospital room?: Total Help needed  climbing 3-5 steps with a railing? : Total 6 Click Score: 6    End of Session   Activity Tolerance: Patient limited by pain Patient left: in bed;with call bell/phone within reach;with bed alarm set   Pain - Right/Left: Left Pain - part of body: Hip     Time: 1047-1106 PT Time Calculation (min) (ACUTE ONLY): 19 min  Charges:  $Therapeutic Exercise: 8-22 mins                     [name], PTA 10/31/18, 11:33 AM

## 2018-10-31 NOTE — Progress Notes (Signed)
Pharmacy Electrolyte Monitoring Consult:  Pharmacy consulted to assist in monitoring and replacing electrolytes in this 85 y.o. female admitted on 10/27/2018 with Fall   Labs:  Sodium  Date Value  10/31/2018 138 mmol/L  02/04/2014 140 mmol/L  11/15/2009 139 mEq/L   Potassium  Date Value  10/31/2018 4.2 mmol/L  02/04/2014 3.9 mmol/L  11/15/2009 3.9 mEq/L   Magnesium (mg/dL)  Date Value  10/31/2018 1.9   Phosphorus (mg/dL)  Date Value  04/30/2010 3.8   Calcium (mg/dL)  Date Value  10/31/2018 8.9  11/15/2009 10.1   Calcium, Total (mg/dL)  Date Value  02/04/2014 8.9   Albumin (g/dL)  Date Value  10/27/2018 3.8  02/04/2014 3.6    Assessment/Plan: Patient receiving LR at 30mL/hr.   Continue Calcium Carbonate 2 tabs BID. No additional replacement warranted at this time.   Obtain BMP/Magnesium with am labs.   Will replace for goal potassium ~ 4, goal magnesium ~ 2, and goal calcium > 8.   Pharmacy will continue to monitor and adjust per consult.    Olivia Canter [facility] Clinical Pharmacist 10/31/2018 7:32 AM

## 2018-10-31 NOTE — Progress Notes (Signed)
1800 Tramadol 50mg  held. [place] x 2 for pain score of "9" out of 10 at the L hip area at 1655 hrs.

## 2018-10-31 NOTE — Progress Notes (Signed)
   Subjective: 3 Days Post-Op Procedure(s) (LRB): INTRAMEDULLARY (IM) NAIL INTERTROCHANTRIC LEFT LONG (Left) Patient reports pain as moderate Patient is well, and has had no acute complaints or problems Denies any CP, SOB, ABD pain. We will continue therapy today.  Plan is to go Skilled nursing facility after hospital stay.  Objective: Vital signs in last 24 hours: Temp:  [97.5 F (36.4 C)-98.8 F (37.1 C)] 97.5 F (36.4 C) (08/23 0816) Pulse Rate:  [57-67] 59 (08/23 0816) Resp:  [18] 18 (08/23 0816) BP: (160-180)/(55-66) 170/60 (08/23 0816) SpO2:  [100 %] 100 % (08/23 0816)  Intake/Output from previous day: 08/22 0701 - 08/23 0700 In: 360 [P.O.:360] Out: 1400 [Urine:1400] Intake/Output this shift: No intake/output data recorded.  Recent Labs    10/29/18 0308 10/30/18 0441 10/31/18 0447  HGB 8.1* 8.4* 8.1*   Recent Labs    10/30/18 0441 10/31/18 0447  WBC 10.8* 8.9  RBC 2.77* 2.66*  HCT 26.1* 25.1*  PLT 205 235   Recent Labs    10/30/18 0441 10/31/18 0447  NA 138 138  K 4.1 4.2  CL 106 108  CO2 25 26  BUN 36* 27*  CREATININE 1.13* 1.06*  GLUCOSE 95 102*  CALCIUM 8.5* 8.9   No results for input(s): LABPT, INR in the last 72 hours.  EXAM General - Patient is Alert, Appropriate and Oriented Extremity - Neurovascular intact Sensation intact distally Intact pulses distally Dorsiflexion/Plantar flexion intact No cellulitis present Compartment soft Dressing - moderate drainage Motor Function - intact, moving foot and toes well on exam.   Past Medical History:  Diagnosis Date  . Anxiety 12/2002  . Arthritis    hands  . Cancer ([place])    breast left arm  . Cancer ([place])    uterine  . Diabetes mellitus 11/2004   Type 2  . Dyspnea    WITH EXERTION  . GERD (gastroesophageal reflux disease)   . Hyperlipidemia 11/2004  . Hypertension 12/2002  . Hypothyroidism   . Osteopenia 02/2002   02/2002 Dexa osteopenia// Dexa 02/26/2004 (Dr. Ubaldo Glassing)  stable/IMR femur/spine  . Spinal stenosis   . 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 Ronnald Collum endocrinologist  . Vertigo     Assessment/Plan:   3 Days Post-Op Procedure(s) (LRB): INTRAMEDULLARY (IM) NAIL INTERTROCHANTRIC LEFT LONG (Left) Active Problems:   Comminuted fracture of left hip (HCC)  Estimated body mass index is 20.58 kg/m as calculated from the following:   Height as of this encounter: 5\' 3"  (1.6 m).   Weight as of this encounter: 52.7 kg. Advance diet Up with therapy  Needs BM Acute post op blood loss anemia - Hgb 8.1.  Hgb stable.  Continue with iron supplement CM to assist with discharge to skilled nursing facility  Patient will need Lovenox 40 mg subcu daily x14 days at discharge TED hose bilateral lower extremity x6 weeks Follow-up with [facility] orthopedics in 2 weeks for staple removal   DVT Prophylaxis - Lovenox, TED hose and SCDs Weight-Bearing as tolerated to left leg   T. Rachelle Hora, PA-C [place] 10/31/2018, 9:16 AM

## 2018-10-31 NOTE — Progress Notes (Signed)
[place] [facility] [place] NAME: [name]    MR#:  [id number]  DATE OF BIRTH:  20-May-1926  SUBJECTIVE:  CHIEF COMPLAINT:   Chief Complaint  Patient presents with  . Fall   - POD # 3  after left hip surgery, remains in normal sinus rhythm - feels well, slightly weak  REVIEW OF SYSTEMS:  Review of Systems  Constitutional: Positive for malaise/fatigue. Negative for chills and fever.  HENT: Negative for congestion, ear discharge, hearing loss and nosebleeds.   Eyes: Negative for blurred vision and double vision.  Respiratory: Negative for cough, shortness of breath and wheezing.   Cardiovascular: Negative for chest pain and palpitations.  Gastrointestinal: Negative for abdominal pain, constipation, diarrhea, nausea and vomiting.  Genitourinary: Negative for dysuria.  Musculoskeletal: Positive for joint pain and myalgias.  Neurological: Negative for dizziness, focal weakness, seizures and headaches.  Psychiatric/Behavioral: Negative for depression.    DRUG ALLERGIES:   Allergies  Allergen Reactions  . Amlodipine Other (See Comments)    Leg edema   . Atenolol Other (See Comments)    REACTION: bradycardia  . Erythromycin Base Other (See Comments)    REACTION: stomach upset  . Lisinopril Other (See Comments)    REACTION: cough  (20 mg)    VITALS:  Blood pressure (!) 170/60, pulse (!) 59, temperature (!) 97.5 F (36.4 C), temperature source Oral, resp. rate 18, height 5\' 3"  (1.6 m), weight 52.7 kg, SpO2 100 %.  PHYSICAL EXAMINATION:  Physical Exam   GENERAL:  85 y.o.-year-old patient lying in the bed with no acute distress.  EYES: Pupils equal, round, reactive to light and accommodation. No scleral icterus. Extraocular muscles intact.  HEENT: Head atraumatic, normocephalic. Oropharynx and nasopharynx clear.  NECK:  Supple, no jugular venous distention. No thyroid enlargement, no tenderness.  LUNGS: Normal breath sounds  bilaterally, no wheezing, rales,rhonchi or crepitation. No use of accessory muscles of respiration. Decreased bibasilar breath sounds S/p left mastectomy CARDIOVASCULAR: S1, S2 normal. No  rubs, or gallops. 3/6 systolic murmur present ABDOMEN: Soft, nontender, nondistended. Bowel sounds present. No organomegaly or mass.  EXTREMITIES: No pedal edema, cyanosis, or clubbing. Left hip dressing in place, there is swelling, old blood  and tenderness around the proximal honey comb dressing NEUROLOGIC: Cranial nerves II through XII are intact. Muscle strength good in all extremities except limited left hip flexion due to pain. Sensation intact. Gait not checked.  PSYCHIATRIC: The patient is alert and oriented x 3.  SKIN: No obvious rash, lesion, or ulcer.    LABORATORY PANEL:   CBC Recent Labs  Lab 10/30/18 0441  WBC 10.8*  HGB 8.4*  HCT 26.1*  PLT 205   ------------------------------------------------------------------------------------------------------------------  Chemistries  Recent Labs  Lab 10/27/18 1115  10/31/18 0447  NA 138   < > 138  K 3.6   < > 4.2  CL 102   < > 108  CO2 25   < > 26  GLUCOSE 149*   < > 102*  BUN 45*   < > 27*  CREATININE 2.11*   < > 1.06*  CALCIUM 9.1   < > 8.9  MG  --    < > 1.9  AST 72*  --   --   ALT 37  --   --   ALKPHOS 58  --   --   BILITOT 1.4*  --   --    < > = values in this interval not displayed.   ------------------------------------------------------------------------------------------------------------------  Cardiac Enzymes No results for input(s): TROPONINI in the last 168 hours. ------------------------------------------------------------------------------------------------------------------  RADIOLOGY:  No results found.  EKG:   Orders placed or performed during the hospital encounter of 10/27/18  . ED EKG  . EKG 12-Lead  . EKG 12-Lead  . ED EKG  . EKG 12-Lead  . EKG 12-Lead  . EKG 12-Lead  . EKG 12-Lead  . EKG  12-Lead  . EKG 12-Lead    ASSESSMENT AND PLAN:   [name] with PMH significant for breast cancer, HTN, arthritis, hypothyroidism brought from home secondary to fall and left hip pain. Was noted to have afib in ED  1. Transient afib with rvr- converted to NSR with IV cardizem, not on any drips now - appreciate cardiology consult -since bradycardic, discontinue oral metoprolol which is not her home medication - No indication for long term anti coagulation if remains in normal sinus rhythm -Continue cardiac monitoring for now  2. Fall and left hip intertrochanteric fracture- appreciate ortho consult - s/p ORIF with Im nail - POD # 3 today, pain control - PT consulted  3. ARF- from fall and rhabdo- improved with IV fluids -Encourage oral nutrition,   4.  Hypertension-restarted losartan.  Continue to monitor closely  5. Acute on chronic anemia- acute post op blood loss anemia, no indication for transfusion yet. Monitor for now -Keep hemoglobin greater than 8  6. DVT prophylaxis- on lovenox   PT consult-Will need rehab at discharge.  Consult social worker   All the records are reviewed and case discussed with Care Management/Social Workerr. Management plans discussed with the patient, family and they are in agreement.  CODE STATUS: Full Code  TOTAL TIME TAKING CARE OF THIS PATIENT: 38 minutes.   POSSIBLE D/C tomorrow, DEPENDING ON CLINICAL CONDITION.   Gladstone Lighter M.D on 10/31/2018 at 8:22 AM  Between 7am to 6pm - Pager - [phone]  After 6pm go to [email or web address] - password [email or web address] [place] [facility]  Office  [phone]  CC: Primary care physician; McLean-Scocuzza, Nino Glow, MD

## 2018-10-31 NOTE — Progress Notes (Signed)
Pts BP 170/60 HR 59, MD Kalisetti notified, MD placing orders.

## 2018-10-31 NOTE — Progress Notes (Signed)
Spoke with DR. Willis. Pt had foley pulled last shift and has not been able to void on her own. Bladder scan showing 640. Place Foley catheter per MD.

## 2018-10-31 NOTE — Progress Notes (Signed)
PT Cancellation Note  Patient Details Name: [name] MRN: [id number] DOB: 1926-11-30   Cancelled Treatment:    Reason Eval/Treat Not Completed: Medical issues which prohibited therapy  Attempted session this am.  Pt in bed stating she feels as though she will pass out and generally dizzy.  BP 166/45 P 58.  RN in and addressing.  Will attempt again later today if appropriate.    Chesley Noon 10/31/2018, 9:39 AM

## 2018-11-01 LAB — BASIC METABOLIC PANEL
Anion gap: 7 (ref 5–15)
BUN: 23 mg/dL (ref 8–23)
CO2: 27 mmol/L (ref 22–32)
Calcium: 9.2 mg/dL (ref 8.9–10.3)
Chloride: 103 mmol/L (ref 98–111)
Creatinine, Ser: 0.9 mg/dL (ref 0.44–1.00)
GFR calc Af Amer: >60 mL/min (ref >60)
GFR calc non Af Amer: 57 mL/min — ABNORMAL LOW (ref >60)
Glucose, Bld: 103 mg/dL — ABNORMAL HIGH (ref 70–99)
Potassium: 4.3 mmol/L (ref 3.5–5.1)
Sodium: 137 mmol/L (ref 135–145)

## 2018-11-01 LAB — GLUCOSE, CAPILLARY
Glucose-Capillary: 97 mg/dL (ref 70–99)
Glucose-Capillary: 132 mg/dL — ABNORMAL HIGH (ref 70–99)
Glucose-Capillary: 114 mg/dL — ABNORMAL HIGH (ref 70–99)
Glucose-Capillary: 115 mg/dL — ABNORMAL HIGH (ref 70–99)

## 2018-11-01 LAB — MAGNESIUM: Magnesium: 2 mg/dL (ref 1.7–2.4)

## 2018-11-01 MED ORDER — ENOXAPARIN SODIUM 40 MG/0.4ML ~~LOC~~ SOLN: 40.0000 mg | SUBCUTANEOUS | 0 refills | Status: DC

## 2018-11-01 MED ORDER — HYDROCODONE-ACETAMINOPHEN 5-325 MG PO TABS: 1.0000 | ORAL_TABLET | ORAL | 0 refills | Status: DC | PRN

## 2018-11-01 MED ORDER — LOSARTAN POTASSIUM 100 MG PO TABS: 100.0000 mg | ORAL_TABLET | Freq: Every day | ORAL | 2 refills | Status: DC

## 2018-11-01 NOTE — TOC Progression Note (Signed)
Transition of Care [facility]) - Progression Note    Patient Details  Name: ASPYN CESARIO MRN: [id number] Date of Birth: 1926-05-07  Transition of Care [facility]) CM/SW Contact  Su Hilt, RN Phone Number: 11/01/2018, 10:04 AM  Clinical Narrative:     Magda Paganini with [facility] called and verified that the patient will go to room 508 and may come today.will need a new covid test  Expected Discharge Plan: [place] Barriers to Discharge: Continued Medical Work up  Expected Discharge Plan and Services Expected Discharge Plan: [place] arrangements for the past 2 months: Single Family Home Expected Discharge Date: 11/01/18                                     Social Determinants of Health (SDOH) Interventions    Readmission Risk Interventions No flowsheet data found.

## 2018-11-01 NOTE — Progress Notes (Signed)
Pharmacy Electrolyte Monitoring Consult:  Pharmacy consulted to assist in monitoring and replacing electrolytes in this 85 y.o. female admitted on 10/27/2018 with Fall   Labs:  Sodium  Date Value  11/01/2018 137 mmol/L  02/04/2014 140 mmol/L  11/15/2009 139 mEq/L   Potassium  Date Value  11/01/2018 4.3 mmol/L  02/04/2014 3.9 mmol/L  11/15/2009 3.9 mEq/L   Magnesium (mg/dL)  Date Value  11/01/2018 2.0   Phosphorus (mg/dL)  Date Value  04/30/2010 3.8   Calcium (mg/dL)  Date Value  11/01/2018 9.2  11/15/2009 10.1   Calcium, Total (mg/dL)  Date Value  02/04/2014 8.9   Albumin (g/dL)  Date Value  10/27/2018 3.8  02/04/2014 3.6    Assessment/Plan:  Electrolytes are stable.  Pharmacy will sign off at this time.  Gerald Dexter, PharmD Pharmacy Resident  11/01/2018 3:21 PM

## 2018-11-01 NOTE — Plan of Care (Signed)
  Problem: Clinical Measurements: Goal: Postoperative complications will be avoided or minimized Outcome: Progressing   Problem: Pain Management: Goal: Pain level will decrease Outcome: Progressing   Problem: Education: Goal: Verbalization of understanding the information provided (i.e., activity precautions, restrictions, etc) will improve Outcome: Not Progressing   Problem: Activity: Goal: Ability to ambulate and perform ADLs will improve Outcome: Not Progressing   Problem: Self-Concept: Goal: Ability to maintain and perform role responsibilities to the fullest extent possible will improve Outcome: Not Progressing

## 2018-11-01 NOTE — TOC Progression Note (Signed)
Transition of Care [facility]) - Progression Note    Patient Details  Name: [name] MRN: [id number] Date of Birth: [birth year]  Transition of Care [facility]) CM/SW Contact  Su Hilt, RN Phone Number: 11/01/2018, 9:16 AM  Clinical Narrative:    Hulen Skains APS worker Arbie Cookey  At [phone], gave my contact information for a call back Let Magda Paganini with [facility] know the plan is to DC today, need room number  Expected Discharge Plan: Skilled Nursing Facility Barriers to Discharge: Continued Medical Work up  Expected Discharge Plan and Services Expected Discharge Plan: [place] arrangements for the past 2 months: Single Family Home                                       Social Determinants of Health (SDOH) Interventions    Readmission Risk Interventions No flowsheet data found.

## 2018-11-01 NOTE — Progress Notes (Signed)
   Subjective: 4 Days Post-Op Procedure(s) (LRB): INTRAMEDULLARY (IM) NAIL INTERTROCHANTRIC LEFT LONG (Left) Patient reports pain as moderate Patient is well, and has had no acute complaints or problems Denies any CP, SOB, ABD pain. We will continue therapy today.  Plan is to go Skilled nursing facility after hospital stay.  Objective: Vital signs in last 24 hours: Temp:  [97.5 F (36.4 C)-98.6 F (37 C)] 98 F (36.7 C) (08/24 0737) Pulse Rate:  [59-76] 74 (08/24 0737) Resp:  [15-18] 15 (08/24 0737) BP: (125-170)/(60-72) 141/64 (08/24 0737) SpO2:  [95 %-100 %] 96 % (08/24 0737)  Intake/Output from previous day: 08/23 0701 - 08/24 0700 In: -  Out: 935 [Urine:935] Intake/Output this shift: No intake/output data recorded.  Recent Labs    10/30/18 0441 10/31/18 0447  HGB 8.4* 8.1*   Recent Labs    10/30/18 0441 10/31/18 0447  WBC 10.8* 8.9  RBC 2.77* 2.66*  HCT 26.1* 25.1*  PLT 205 235   Recent Labs    10/31/18 0447 11/01/18 0453  NA 138 137  K 4.2 4.3  CL 108 103  CO2 26 27  BUN 27* 23  CREATININE 1.06* 0.90  GLUCOSE 102* 103*  CALCIUM 8.9 9.2   No results for input(s): LABPT, INR in the last 72 hours.  EXAM General - Patient is Alert, Appropriate and Oriented Extremity - Neurovascular intact Sensation intact distally Intact pulses distally Dorsiflexion/Plantar flexion intact No cellulitis present Compartment soft Dressing - scant drainage Motor Function - intact, moving foot and toes well on exam.   Past Medical History:  Diagnosis Date  . Anxiety 12/2002  . Arthritis    hands  . Cancer ([place])    breast left arm  . Cancer ([place])    uterine  . Diabetes mellitus 11/2004   Type 2  . Dyspnea    WITH EXERTION  . GERD (gastroesophageal reflux disease)   . Hyperlipidemia 11/2004  . Hypertension 12/2002  . Hypothyroidism   . Osteopenia 02/2002   02/2002 Dexa osteopenia// Dexa 02/26/2004 (Dr. Ubaldo Glassing) stable/IMR femur/spine  . Spinal stenosis    . 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 Ronnald Collum endocrinologist  . Vertigo     Assessment/Plan:   4 Days Post-Op Procedure(s) (LRB): INTRAMEDULLARY (IM) NAIL INTERTROCHANTRIC LEFT LONG (Left) Active Problems:   Comminuted fracture of left hip (HCC)  Estimated body mass index is 20.58 kg/m as calculated from the following:   Height as of this encounter: 5\' 3"  (1.6 m).   Weight as of this encounter: 52.7 kg. Advance diet Up with therapy  Acute post op blood loss anemia - Hgb pending. Continue with iron supplement CM to assist with discharge to skilled nursing facility  Patient will need Lovenox 40 mg subcu daily x14 days at discharge TED hose bilateral lower extremity x6 weeks Follow-up with [facility]opedics in 2 weeks for staple removal   DVT Prophylaxis - Lovenox, TED hose and SCDs Weight-Bearing as tolerated to left leg   T. Rachelle Hora, PA-C [place] 11/01/2018, 8:15 AM

## 2018-11-01 NOTE — Discharge Summary (Signed)
[facility] NAME: [name]    MR#:  [id number]  DATE OF BIRTH:  [birth year]  DATE OF ADMISSION:  10/27/2018   ADMITTING PHYSICIAN: Dustin Flock, MD  DATE OF DISCHARGE: 11/01/2018  PRIMARY CARE PHYSICIAN: McLean-Scocuzza, Nino Glow, MD   ADMISSION DIAGNOSIS:   Dehydration [E86.0] Fall [W19.XXXA] AKI (acute kidney injury) ([place]) [N17.9] Closed fracture of left hip, initial encounter ([place]) [S72.002A] Fall, initial encounter [W19.XXXA]  DISCHARGE DIAGNOSIS:   Active Problems:   Comminuted fracture of left hip ([place])   SECONDARY DIAGNOSIS:   Past Medical History:  Diagnosis Date  . Anxiety 12/2002  . Arthritis    hands  . Cancer ([place])    breast left arm  . Cancer ([place])    uterine  . Diabetes mellitus 11/2004   Type 2  . Dyspnea    WITH EXERTION  . GERD (gastroesophageal reflux disease)   . Hyperlipidemia 11/2004  . Hypertension 12/2002  . Hypothyroidism   . Osteopenia 02/2002   02/2002 Dexa osteopenia// Dexa 02/26/2004 (Dr. Ubaldo Glassing) stable/IMR femur/spine  . Spinal stenosis   . 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 Ronnald Collum endocrinologist  . Vertigo     HOSPITAL COURSE:   [name] with PMH significant for breast cancer, HTN, arthritis, hypothyroidism brought from home secondary to fall and left hip pain. Was noted to have afib in ED  1. Transient afib with rvr- converted to NSR with IV cardizem, not on any drips - appreciate cardiology consult -since bradycardic, discontinued oral metoprolol which is not her home medication - No indication for long term anti coagulation if remains in normal sinus rhythm -stable on cardiac monitoring for now  2. Fall and left hip intertrochanteric fracture- appreciate ortho consult - s/p ORIF with Im nail - POD # 4 today, pain control - PT consulted  3. ARF- from fall and rhabdo- improved with IV fluids -Encourage oral nutrition,    4.  Hypertension-restarted losartan.  Continue to monitor closely  5. Acute on chronic anemia- acute post op blood loss anemia, no indication for transfusion . Monitor for now -Keep hemoglobin greater than 8  6. DVT prophylaxis- on lovenox for 2 weeks   PT consult- need rehab at discharge. discharge to [facility] today   DISCHARGE CONDITIONS:   Guarded  CONSULTS OBTAINED:   Treatment Team:  Hessie Knows, MD  DRUG ALLERGIES:   Allergies  Allergen Reactions  . Amlodipine Other (See Comments)    Leg edema   . Atenolol Other (See Comments)    REACTION: bradycardia  . Erythromycin Base Other (See Comments)    REACTION: stomach upset  . Lisinopril Other (See Comments)    REACTION: cough  (20 mg)   DISCHARGE MEDICATIONS:   Allergies as of 11/01/2018      Reactions   Amlodipine Other (See Comments)   Leg edema    Atenolol Other (See Comments)   REACTION: bradycardia   Erythromycin Base Other (See Comments)   REACTION: stomach upset   Lisinopril Other (See Comments)   REACTION: cough  (20 mg)      Medication List    STOP taking these medications   diclofenac sodium 1 % Gel Commonly known as: VOLTAREN   losartan-hydrochlorothiazide 100-25 MG tablet Commonly known as: HYZAAR     TAKE these medications   aspirin 81 MG tablet Take 81 mg by mouth daily. Daily with food   Cholecalciferol  1.25 MG (50000 UT) capsule Take 1 capsule (50,000 Units total) by mouth once a week.   enoxaparin 40 MG/0.4ML injection Commonly known as: Lovenox Inject 0.4 mLs (40 mg total) into the skin daily for 14 days.   gabapentin 100 MG capsule Commonly known as: NEURONTIN TAKE ONE (1) CAPSULE BY MOUTH 2 TIMES DAILY   HYDROcodone-acetaminophen 5-325 MG tablet Commonly known as: NORCO/VICODIN Take 1-2 tablets by mouth every 4 (four) hours as needed for moderate pain.   levothyroxine 50 MCG tablet Commonly known as: SYNTHROID Take 1 tablet (50 mcg total) by mouth  daily.   losartan 100 MG tablet Commonly known as: COZAAR Take 1 tablet (100 mg total) by mouth daily.   mometasone 0.1 % cream Commonly known as: Elocon Apply 1 application topically daily. To affected areas behind ears   polyethylene glycol powder 17 GM/SCOOP powder Commonly known as: GLYCOLAX/MIRALAX Take 17 g by mouth daily as needed. As directed for constipation   vitamin C 500 MG tablet Commonly known as: ASCORBIC ACID Take 500 mg by mouth daily.        DISCHARGE INSTRUCTIONS:   1.  PCP follow-up in 1 to 2 weeks 2.  Orthopedics follow-up in 2 weeks  DIET:   Cardiac diet  ACTIVITY:   Activity as tolerated  OXYGEN:   Home Oxygen: No.  Oxygen Delivery: room air  DISCHARGE LOCATION:   nursing home   If you experience worsening of your admission symptoms, develop shortness of breath, life threatening emergency, suicidal or homicidal thoughts you must seek medical attention immediately by calling 911 or calling your MD immediately  if symptoms less severe.  You Must read complete instructions/literature along with all the possible adverse reactions/side effects for all the Medicines you take and that have been prescribed to you. Take any new Medicines after you have completely understood and accpet all the possible adverse reactions/side effects.   Please note  You were cared for by a hospitalist during your hospital stay. If you have any questions about your discharge medications or the care you received while you were in the hospital after you are discharged, you can call the unit and asked to speak with the hospitalist on call if the hospitalist that took care of you is not available. Once you are discharged, your primary care physician will handle any further medical issues. Please note that NO REFILLS for any discharge medications will be authorized once you are discharged, as it is imperative that you return to your primary care physician (or establish a  relationship with a primary care physician if you do not have one) for your aftercare needs so that they can reassess your need for medications and monitor your lab values.    On the day of Discharge:  VITAL SIGNS:   Blood pressure (!) 141/64, pulse 74, temperature 98 F (36.7 C), temperature source Oral, resp. rate 15, height 5\' 3"  (1.6 m), weight 52.7 kg, SpO2 96 %.  PHYSICAL EXAMINATION:    GENERAL:  85 y.o.-year-old patient lying in the bed with no acute distress.  EYES: Pupils equal, round, reactive to light and accommodation. No scleral icterus. Extraocular muscles intact.  HEENT: Head atraumatic, normocephalic. Oropharynx and nasopharynx clear.  NECK:  Supple, no jugular venous distention. No thyroid enlargement, no tenderness.  LUNGS: Normal breath sounds bilaterally, no wheezing, rales,rhonchi or crepitation. No use of accessory muscles of respiration. Decreased bibasilar breath sounds S/p left mastectomy CARDIOVASCULAR: S1, S2 normal. No  rubs, or gallops. 3/6  systolic murmur present ABDOMEN: Soft, nontender, nondistended. Bowel sounds present. No organomegaly or mass.  EXTREMITIES: No pedal edema, cyanosis, or clubbing. Left hip dressing in place, there is swelling, old blood  and tenderness around the proximal honey comb dressing NEUROLOGIC: Cranial nerves II through XII are intact. Muscle strength good in all extremities except limited left hip flexion due to pain. Sensation intact. Gait not checked.  PSYCHIATRIC: The patient is alert and oriented x 3.  SKIN: No obvious rash, lesion, or ulcer.   DATA REVIEW:   CBC Recent Labs  Lab 10/31/18 0447  WBC 8.9  HGB 8.1*  HCT 25.1*  PLT 235    Chemistries  Recent Labs  Lab 10/27/18 1115  11/01/18 0453  NA 138   < > 137  K 3.6   < > 4.3  CL 102   < > 103  CO2 25   < > 27  GLUCOSE 149*   < > 103*  BUN 45*   < > 23  CREATININE 2.11*   < > 0.90  CALCIUM 9.1   < > 9.2  MG  --    < > 2.0  AST 72*  --   --   ALT 37   --   --   ALKPHOS 58  --   --   BILITOT 1.4*  --   --    < > = values in this interval not displayed.     Microbiology Results  Results for orders placed or performed during the hospital encounter of 10/27/18  SARS Coronavirus 2 [facility] order, Performed in [facility] hospital lab) Nasopharyngeal Nasopharyngeal Swab     Status: None   Collection Time: 10/27/18 12:16 PM   Specimen: Nasopharyngeal Swab  Result Value Ref Range Status   SARS Coronavirus 2 NEGATIVE NEGATIVE Final    Comment: (NOTE) If result is NEGATIVE SARS-CoV-2 target nucleic acids are NOT DETECTED. The SARS-CoV-2 RNA is generally detectable in upper and lower  respiratory specimens during the acute phase of infection. The lowest  concentration of SARS-CoV-2 viral copies this assay can detect is 250  copies / mL. A negative result does not preclude SARS-CoV-2 infection  and should not be used as the sole basis for treatment or other  patient management decisions.  A negative result may occur with  improper specimen collection / handling, submission of specimen other  than nasopharyngeal swab, presence of viral mutation(s) within the  areas targeted by this assay, and inadequate number of viral copies  (<250 copies / mL). A negative result must be combined with clinical  observations, patient history, and epidemiological information. If result is POSITIVE SARS-CoV-2 target nucleic acids are DETECTED. The SARS-CoV-2 RNA is generally detectable in upper and lower  respiratory specimens dur ing the acute phase of infection.  Positive  results are indicative of active infection with SARS-CoV-2.  Clinical  correlation with patient history and other diagnostic information is  necessary to determine patient infection status.  Positive results do  not rule out bacterial infection or co-infection with other viruses. If result is PRESUMPTIVE POSTIVE SARS-CoV-2 nucleic acids MAY BE PRESENT.   A presumptive positive result  was obtained on the submitted specimen  and confirmed on repeat testing.  While 2019 novel coronavirus  (SARS-CoV-2) nucleic acids may be present in the submitted sample  additional confirmatory testing may be necessary for epidemiological  and / or clinical management purposes  to differentiate between  SARS-CoV-2 and other Sarbecovirus currently known to  infect humans.  If clinically indicated additional testing with an alternate test  methodology [phone]) is advised. The SARS-CoV-2 RNA is generally  detectable in upper and lower respiratory sp ecimens during the acute  phase of infection. The expected result is Negative. Fact Sheet for Patients:  StrictlyIdeas.no Fact Sheet for Healthcare Providers: BankingDealers.co.za This test is not yet approved or cleared by the [place] FDA and has been authorized for detection and/or diagnosis of SARS-CoV-2 by FDA under an Emergency Use Authorization (EUA).  This EUA will remain in effect (meaning this test can be used) for the duration of the COVID-19 declaration under Section 564(b)(1) of the Act, 21 U.S.C. section 360bbb-3(b)(1), unless the authorization is terminated or revoked sooner. Performed at [facility], [address] [place], [place] [address]   MRSA PCR Screening     Status: None   Collection Time: 10/27/18  7:31 PM   Specimen: Nasal Mucosa; Nasopharyngeal  Result Value Ref Range Status   MRSA by PCR NEGATIVE NEGATIVE Final    Comment:        The GeneXpert MRSA Assay (FDA approved for NASAL specimens only), is one component of a comprehensive MRSA colonization surveillance program. It is not intended to diagnose MRSA infection nor to guide or monitor treatment for MRSA infections. Performed at [facility], [address], [place], [place] [address]     RADIOLOGY:  No results found.   Management plans discussed with the patient, family and  they are in agreement.  CODE STATUS:     Code Status Orders  (From admission, onward)         Start     Ordered   10/27/18 1659  Full code  Continuous     10/27/18 1658        Code Status History    This patient has a current code status but no historical code status.   Advance Care Planning Activity    Advance Directive Documentation     Most Recent Value  Type of Advance Directive  Healthcare Power of Attorney  Pre-existing out of facility DNR order (yellow form or pink MOST form)  -  "MOST" Form in Place?  -      TOTAL TIME TAKING CARE OF THIS PATIENT: 38 minutes.    Gladstone Lighter M.D on 11/01/2018 at 9:37 AM  Between 7am to 6pm - Pager - [phone]  After 6pm go to [email or web address] - Proofreader  [facility]  Office  [phone]  CC: Primary care physician; McLean-Scocuzza, Nino Glow, MD   Note: This dictation was prepared with Dragon dictation along with smaller phrase technology. Any transcriptional errors that result from this process are unintentional.

## 2018-11-01 NOTE — TOC Transition Note (Signed)
Transition of Care [facility]) - CM/SW Discharge Note   Patient Details  Name: [name] MRN: [id number] Date of Birth: 1927/01/27  Transition of Care [facility]) CM/SW Contact:  Su Hilt, RN Phone Number: 11/01/2018, 2:19 PM   Clinical Narrative:     Patient to DC to [facility] room 508 today via EMS, The bedside nurse will call report to [phone] I attempted to notify [name] the patient's friend at the patient's request I left a VM for a return call.  The patient requested that the son not be notified.   Final next level of care: Skilled Nursing Facility Barriers to Discharge: Barriers Resolved   Patient Goals and CMS Choice Patient states their goals for this hospitalization and ongoing recovery are:: get better      Discharge Placement                       Discharge Plan and Services                                     Social Determinants of Health (SDOH) Interventions     Readmission Risk Interventions No flowsheet data found.

## 2018-11-01 NOTE — Care Management Important Message (Signed)
Important Message  Patient Details  Name: [name] MRN: [id number] Date of Birth: 10/08/1926   Medicare Important Message Given:  Yes     [name] 11/01/2018, 10:38 AM

## 2018-11-02 LAB — GLUCOSE, CAPILLARY
Glucose-Capillary: 112 mg/dL — ABNORMAL HIGH (ref 70–99)
Glucose-Capillary: 127 mg/dL — ABNORMAL HIGH (ref 70–99)
Glucose-Capillary: 106 mg/dL — ABNORMAL HIGH (ref 70–99)

## 2018-11-02 LAB — NOVEL CORONAVIRUS, NAA (HOSP ORDER, SEND-OUT TO REF LAB; TAT 18-24 HRS): SARS-CoV-2, NAA: NOT DETECTED

## 2018-11-02 MED ORDER — ENOXAPARIN SODIUM 40 MG/0.4ML ~~LOC~~ SOLN
40.0000 mg | SUBCUTANEOUS | Status: DC
  Administered 2018-11-03: 40 mg via SUBCUTANEOUS
  Filled 2018-11-02: qty 0.4

## 2018-11-02 NOTE — Discharge Summary (Signed)
[facility] NAME: [name]    MR#:  [id number]  DATE OF BIRTH:  Jul 09, 1926  DATE OF ADMISSION:  10/27/2018   ADMITTING PHYSICIAN: Dustin Flock, MD  DATE OF DISCHARGE: 11/02/2018  PRIMARY CARE PHYSICIAN: McLean-Scocuzza, Nino Glow, MD   ADMISSION DIAGNOSIS:   Dehydration [E86.0] Fall [W19.XXXA] AKI (acute kidney injury) ([place]) [N17.9] Closed fracture of left hip, initial encounter ([place]) [S72.002A] Fall, initial encounter [W19.XXXA]  DISCHARGE DIAGNOSIS:   Active Problems:   Comminuted fracture of left hip ([place])   SECONDARY DIAGNOSIS:   Past Medical History:  Diagnosis Date  . Anxiety 12/2002  . Arthritis    hands  . Cancer ([place])    breast left arm  . Cancer ([place])    uterine  . Diabetes mellitus 11/2004   Type 2  . Dyspnea    WITH EXERTION  . GERD (gastroesophageal reflux disease)   . Hyperlipidemia 11/2004  . Hypertension 12/2002  . Hypothyroidism   . Osteopenia 02/2002   02/2002 Dexa osteopenia// Dexa 02/26/2004 (Dr. Ubaldo Glassing) stable/IMR femur/spine  . Spinal stenosis   . 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 Ronnald Collum endocrinologist  . Vertigo     HOSPITAL COURSE:   [name] with PMH significant for breast cancer, HTN, arthritis, hypothyroidism brought from home secondary to fall and left hip pain. Was noted to have afib in ED  1. Transient afib with rvr- converted to NSR with IV cardizem, not on any drips - appreciate cardiology consult -since bradycardic, discontinued oral metoprolol which is not her home medication - No indication for long term anti coagulation if remains in normal sinus rhythm -stable on cardiac monitoring for now  2. Fall and left hip intertrochanteric fracture- appreciate ortho consult - s/p ORIF with Im nail - POD # 5 today, pain control - PT consulted  3. ARF- from fall and rhabdo- improved with IV fluids -Encourage oral nutrition,    4.  Hypertension-restarted losartan.  Continue to monitor closely  5. Acute on chronic anemia- acute post op blood loss anemia, no indication for transfusion . Monitor for now -Keep hemoglobin greater than 8  6. DVT prophylaxis- on lovenox for 2 weeks   PT consult- need rehab at discharge. discharge to [facility] today   DISCHARGE CONDITIONS:   Guarded  CONSULTS OBTAINED:   Treatment Team:  Hessie Knows, MD  DRUG ALLERGIES:   Allergies  Allergen Reactions  . Amlodipine Other (See Comments)    Leg edema   . Atenolol Other (See Comments)    REACTION: bradycardia  . Erythromycin Base Other (See Comments)    REACTION: stomach upset  . Lisinopril Other (See Comments)    REACTION: cough  (20 mg)   DISCHARGE MEDICATIONS:   Allergies as of 11/02/2018      Reactions   Amlodipine Other (See Comments)   Leg edema    Atenolol Other (See Comments)   REACTION: bradycardia   Erythromycin Base Other (See Comments)   REACTION: stomach upset   Lisinopril Other (See Comments)   REACTION: cough  (20 mg)      Medication List    STOP taking these medications   diclofenac sodium 1 % Gel Commonly known as: VOLTAREN   losartan-hydrochlorothiazide 100-25 MG tablet Commonly known as: HYZAAR     TAKE these medications   aspirin 81 MG tablet Take 81 mg by mouth daily. Daily with food   Cholecalciferol  1.25 MG (50000 UT) capsule Take 1 capsule (50,000 Units total) by mouth once a week.   enoxaparin 40 MG/0.4ML injection Commonly known as: Lovenox Inject 0.4 mLs (40 mg total) into the skin daily for 14 days.   gabapentin 100 MG capsule Commonly known as: NEURONTIN TAKE ONE (1) CAPSULE BY MOUTH 2 TIMES DAILY   HYDROcodone-acetaminophen 5-325 MG tablet Commonly known as: NORCO/VICODIN Take 1-2 tablets by mouth every 4 (four) hours as needed for moderate pain.   levothyroxine 50 MCG tablet Commonly known as: SYNTHROID Take 1 tablet (50 mcg total) by mouth  daily.   losartan 100 MG tablet Commonly known as: COZAAR Take 1 tablet (100 mg total) by mouth daily.   mometasone 0.1 % cream Commonly known as: Elocon Apply 1 application topically daily. To affected areas behind ears   polyethylene glycol powder 17 GM/SCOOP powder Commonly known as: GLYCOLAX/MIRALAX Take 17 g by mouth daily as needed. As directed for constipation   vitamin C 500 MG tablet Commonly known as: ASCORBIC ACID Take 500 mg by mouth daily.        DISCHARGE INSTRUCTIONS:   1.  PCP follow-up in 1 to 2 weeks 2.  Orthopedics follow-up in 2 weeks  DIET:   Cardiac diet  ACTIVITY:   Activity as tolerated  OXYGEN:   Home Oxygen: No.  Oxygen Delivery: room air  DISCHARGE LOCATION:   nursing home   If you experience worsening of your admission symptoms, develop shortness of breath, life threatening emergency, suicidal or homicidal thoughts you must seek medical attention immediately by calling 911 or calling your MD immediately  if symptoms less severe.  You Must read complete instructions/literature along with all the possible adverse reactions/side effects for all the Medicines you take and that have been prescribed to you. Take any new Medicines after you have completely understood and accpet all the possible adverse reactions/side effects.   Please note  You were cared for by a hospitalist during your hospital stay. If you have any questions about your discharge medications or the care you received while you were in the hospital after you are discharged, you can call the unit and asked to speak with the hospitalist on call if the hospitalist that took care of you is not available. Once you are discharged, your primary care physician will handle any further medical issues. Please note that NO REFILLS for any discharge medications will be authorized once you are discharged, as it is imperative that you return to your primary care physician (or establish a  relationship with a primary care physician if you do not have one) for your aftercare needs so that they can reassess your need for medications and monitor your lab values.    On the day of Discharge:  VITAL SIGNS:   Blood pressure (!) 156/62, pulse 79, temperature 98.3 F (36.8 C), temperature source Oral, resp. rate 18, height 5\' 3"  (1.6 m), weight 52.7 kg, SpO2 95 %.  PHYSICAL EXAMINATION:    GENERAL:  85 y.o.-year-old patient lying in the bed with no acute distress.  EYES: Pupils equal, round, reactive to light and accommodation. No scleral icterus. Extraocular muscles intact.  HEENT: Head atraumatic, normocephalic. Oropharynx and nasopharynx clear.  NECK:  Supple, no jugular venous distention. No thyroid enlargement, no tenderness.  LUNGS: Normal breath sounds bilaterally, no wheezing, rales,rhonchi or crepitation. No use of accessory muscles of respiration. Decreased bibasilar breath sounds S/p left mastectomy CARDIOVASCULAR: S1, S2 normal. No  rubs, or gallops. 3/6  systolic murmur present ABDOMEN: Soft, nontender, nondistended. Bowel sounds present. No organomegaly or mass.  EXTREMITIES: No pedal edema, cyanosis, or clubbing. Left hip dressing in place, there is swelling, old blood  and tenderness around the proximal honey comb dressing NEUROLOGIC: Cranial nerves II through XII are intact. Muscle strength good in all extremities except limited left hip flexion due to pain. Sensation intact. Gait not checked.  PSYCHIATRIC: The patient is alert and oriented x 3.  SKIN: No obvious rash, lesion, or ulcer.   DATA REVIEW:   CBC Recent Labs  Lab 10/31/18 0447  WBC 8.9  HGB 8.1*  HCT 25.1*  PLT 235    Chemistries  Recent Labs  Lab 10/27/18 1115  11/01/18 0453  NA 138   < > 137  K 3.6   < > 4.3  CL 102   < > 103  CO2 25   < > 27  GLUCOSE 149*   < > 103*  BUN 45*   < > 23  CREATININE 2.11*   < > 0.90  CALCIUM 9.1   < > 9.2  MG  --    < > 2.0  AST 72*  --   --   ALT  37  --   --   ALKPHOS 58  --   --   BILITOT 1.4*  --   --    < > = values in this interval not displayed.     Microbiology Results  Results for orders placed or performed during the hospital encounter of 10/27/18  SARS Coronavirus 2 [facility] order, Performed in [facility] hospital lab) Nasopharyngeal Nasopharyngeal Swab     Status: None   Collection Time: 10/27/18 12:16 PM   Specimen: Nasopharyngeal Swab  Result Value Ref Range Status   SARS Coronavirus 2 NEGATIVE NEGATIVE Final    Comment: (NOTE) If result is NEGATIVE SARS-CoV-2 target nucleic acids are NOT DETECTED. The SARS-CoV-2 RNA is generally detectable in upper and lower  respiratory specimens during the acute phase of infection. The lowest  concentration of SARS-CoV-2 viral copies this assay can detect is 250  copies / mL. A negative result does not preclude SARS-CoV-2 infection  and should not be used as the sole basis for treatment or other  patient management decisions.  A negative result may occur with  improper specimen collection / handling, submission of specimen other  than nasopharyngeal swab, presence of viral mutation(s) within the  areas targeted by this assay, and inadequate number of viral copies  (<250 copies / mL). A negative result must be combined with clinical  observations, patient history, and epidemiological information. If result is POSITIVE SARS-CoV-2 target nucleic acids are DETECTED. The SARS-CoV-2 RNA is generally detectable in upper and lower  respiratory specimens dur ing the acute phase of infection.  Positive  results are indicative of active infection with SARS-CoV-2.  Clinical  correlation with patient history and other diagnostic information is  necessary to determine patient infection status.  Positive results do  not rule out bacterial infection or co-infection with other viruses. If result is PRESUMPTIVE POSTIVE SARS-CoV-2 nucleic acids MAY BE PRESENT.   A presumptive positive  result was obtained on the submitted specimen  and confirmed on repeat testing.  While 2019 novel coronavirus  (SARS-CoV-2) nucleic acids may be present in the submitted sample  additional confirmatory testing may be necessary for epidemiological  and / or clinical management purposes  to differentiate between  SARS-CoV-2 and other Sarbecovirus currently known to  infect humans.  If clinically indicated additional testing with an alternate test  methodology [phone]) is advised. The SARS-CoV-2 RNA is generally  detectable in upper and lower respiratory sp ecimens during the acute  phase of infection. The expected result is Negative. Fact Sheet for Patients:  StrictlyIdeas.no Fact Sheet for Healthcare Providers: BankingDealers.co.za This test is not yet approved or cleared by the [place] FDA and has been authorized for detection and/or diagnosis of SARS-CoV-2 by FDA under an Emergency Use Authorization (EUA).  This EUA will remain in effect (meaning this test can be used) for the duration of the COVID-19 declaration under Section 564(b)(1) of the Act, 21 U.S.C. section 360bbb-3(b)(1), unless the authorization is terminated or revoked sooner. Performed at [facility], [address] [place], [place] [address]   MRSA PCR Screening     Status: None   Collection Time: 10/27/18  7:31 PM   Specimen: Nasal Mucosa; Nasopharyngeal  Result Value Ref Range Status   MRSA by PCR NEGATIVE NEGATIVE Final    Comment:        The GeneXpert MRSA Assay (FDA approved for NASAL specimens only), is one component of a comprehensive MRSA colonization surveillance program. It is not intended to diagnose MRSA infection nor to guide or monitor treatment for MRSA infections. Performed at [facility], [address], [place], [place] [address]     RADIOLOGY:  No results found.   Management plans discussed with the patient,  family and they are in agreement.  CODE STATUS:     Code Status Orders  (From admission, onward)         Start     Ordered   10/27/18 1659  Full code  Continuous     10/27/18 1658        Code Status History    This patient has a current code status but no historical code status.   Advance Care Planning Activity    Advance Directive Documentation     Most Recent Value  Type of Advance Directive  Healthcare Power of Attorney  Pre-existing out of facility DNR order (yellow form or pink MOST form)  -  "MOST" Form in Place?  -      TOTAL TIME TAKING CARE OF THIS PATIENT: 38 minutes.    Gladstone Lighter M.D on 11/02/2018 at 11:09 AM  Between 7am to 6pm - Pager - [phone]  After 6pm go to [email or web address] - Proofreader  [facility]  Office  [phone]  CC: Primary care physician; McLean-Scocuzza, Nino Glow, MD   Note: This dictation was prepared with Dragon dictation along with smaller phrase technology. Any transcriptional errors that result from this process are unintentional.

## 2018-11-02 NOTE — Progress Notes (Signed)
[facility] [place] NAME: [name]    MR#:  [id number]  DATE OF BIRTH:  12/29/1926  SUBJECTIVE:  CHIEF COMPLAINT:   Chief Complaint  Patient presents with  . Fall   - POD # 5  after left hip surgery, remains in normal sinus rhythm -  Awaiting to go to SNF  REVIEW OF SYSTEMS:  Review of Systems  Constitutional: Positive for malaise/fatigue. Negative for chills and fever.  HENT: Negative for congestion, ear discharge, hearing loss and nosebleeds.   Eyes: Negative for blurred vision and double vision.  Respiratory: Negative for cough, shortness of breath and wheezing.   Cardiovascular: Negative for chest pain and palpitations.  Gastrointestinal: Negative for abdominal pain, constipation, diarrhea, nausea and vomiting.  Genitourinary: Negative for dysuria.  Musculoskeletal: Positive for joint pain and myalgias.  Neurological: Negative for dizziness, focal weakness, seizures and headaches.  Psychiatric/Behavioral: Negative for depression.    DRUG ALLERGIES:   Allergies  Allergen Reactions  . Amlodipine Other (See Comments)    Leg edema   . Atenolol Other (See Comments)    REACTION: bradycardia  . Erythromycin Base Other (See Comments)    REACTION: stomach upset  . Lisinopril Other (See Comments)    REACTION: cough  (20 mg)    VITALS:  Blood pressure (!) 156/62, pulse 79, temperature 98.3 F (36.8 C), temperature source Oral, resp. rate 18, height 5\' 3"  (1.6 m), weight 52.7 kg, SpO2 95 %.  PHYSICAL EXAMINATION:  Physical Exam   GENERAL:  85 y.o.-year-old patient lying in the bed with no acute distress.  EYES: Pupils equal, round, reactive to light and accommodation. No scleral icterus. Extraocular muscles intact.  HEENT: Head atraumatic, normocephalic. Oropharynx and nasopharynx clear.  NECK:  Supple, no jugular venous distention. No thyroid enlargement, no tenderness.  LUNGS: Normal breath sounds bilaterally, no  wheezing, rales,rhonchi or crepitation. No use of accessory muscles of respiration. Decreased bibasilar breath sounds S/p left mastectomy CARDIOVASCULAR: S1, S2 normal. No  rubs, or gallops. 3/6 systolic murmur present ABDOMEN: Soft, nontender, nondistended. Bowel sounds present. No organomegaly or mass.  EXTREMITIES: No pedal edema, cyanosis, or clubbing. Left hip dressing in place, there is swelling, old blood  and tenderness around the proximal honey comb dressing NEUROLOGIC: Cranial nerves II through XII are intact. Muscle strength good in all extremities except limited left hip flexion due to pain. Sensation intact. Gait not checked.  PSYCHIATRIC: The patient is alert and oriented x 3.  SKIN: No obvious rash, lesion, or ulcer.    LABORATORY PANEL:   CBC Recent Labs  Lab 10/31/18 0447  WBC 8.9  HGB 8.1*  HCT 25.1*  PLT 235   ------------------------------------------------------------------------------------------------------------------  Chemistries  Recent Labs  Lab 10/27/18 1115  11/01/18 0453  NA 138   < > 137  K 3.6   < > 4.3  CL 102   < > 103  CO2 25   < > 27  GLUCOSE 149*   < > 103*  BUN 45*   < > 23  CREATININE 2.11*   < > 0.90  CALCIUM 9.1   < > 9.2  MG  --    < > 2.0  AST 72*  --   --   ALT 37  --   --   ALKPHOS 58  --   --   BILITOT 1.4*  --   --    < > = values in this interval not displayed.   ------------------------------------------------------------------------------------------------------------------  Cardiac Enzymes No results for input(s): TROPONINI in the last 168 hours. ------------------------------------------------------------------------------------------------------------------  RADIOLOGY:  No results found.  EKG:   Orders placed or performed during the hospital encounter of 10/27/18  . ED EKG  . EKG 12-Lead  . EKG 12-Lead  . ED EKG  . EKG 12-Lead  . EKG 12-Lead  . EKG 12-Lead  . EKG 12-Lead  . EKG 12-Lead  . EKG 12-Lead     ASSESSMENT AND PLAN:   [name] with PMH significant for breast cancer, HTN, arthritis, hypothyroidism brought from home secondary to fall and left hip pain. Was noted to have afib in ED  1. Transient afib with rvr- converted to NSR with IV cardizem, not on any drips now - appreciate cardiology consult -since bradycardic, discontinued oral metoprolol which is not her home medication - No indication for long term anti coagulation if remains in normal sinus rhythm  2. Fall and left hip intertrochanteric fracture- appreciate ortho consult - s/p ORIF with Im nail - POD # 5 today, pain control - PT consulted  3. ARF- from fall and rhabdo- improved with IV fluids -Encourage oral nutrition,   4.  Hypertension-restarted losartan.  Continue to monitor closely  5. Acute on chronic anemia- acute post op blood loss anemia, no indication for transfusion yet. Monitor for now -Keep hemoglobin greater than 8  6. DVT prophylaxis- on lovenox   PT consult- rehab at discharge.  Likely discharge today. Awaiting COVID test results   All the records are reviewed and case discussed with Care Management/Social Workerr. Management plans discussed with the patient, family and they are in agreement.  CODE STATUS: Full Code  TOTAL TIME TAKING CARE OF THIS PATIENT: 38 minutes.   POSSIBLE D/C today or tomorrow, DEPENDING ON CLINICAL CONDITION.   Gladstone Lighter M.D on 11/02/2018 at 11:02 AM  Between 7am to 6pm - Pager - [phone]  After 6pm go to [email or web address] - password [email or web address]  Office  [phone]  CC: Primary care physician; McLean-Scocuzza, Nino Glow, MD

## 2018-11-02 NOTE — Progress Notes (Addendum)
Physical Therapy Treatment Patient Details Name: [name] MRN: [id number] DOB: 04-Sep-1926 Today's Date: 11/02/2018    History of Present Illness [name] is a 85 y.o. s/p L IM nail after a mechanical fall at home. Per chart, the pt fell backwards locking her door and remained on the ground overnight until she was found the next day by her son. According to chart, her son picked her up and put her in her recliner where she stayed until a friend visited the following day and called EMS. In the emergency department, labs were notable for acute kidney injury and elevated creatine kinase consistent with rhabdomyolysis. Pt currently in CCU for cardiac monitoring and mgt of Atrial fibrillation with RVR (now resolved), and mild elevation in troponins. PMH includes: hypertension, DM2, hypothyroidism, anxiety, & left breast cancer s/p mastectomy.    PT Comments    Pt agreeable to PT; significant pain in LLE that increases with limited movement. Throughout session of limited supine exercises, pt becomes increasingly distraught moaning and hyper verbal repeating several phrases regarding going home to see her dog and the pain. Pt can intermittently be distracted to focus on exercises briefly. Pt continues with current discharge to skilled nursing facility to continue rehab efforts. Awaiting results from covid test performed yesterday.   Follow Up Recommendations  SNF     Equipment Recommendations  Rolling walker with 5" wheels;3in1 (PT)    Recommendations for Other Services       Precautions / Restrictions Precautions Precautions: Fall Restrictions Weight Bearing Restrictions: Yes LLE Weight Bearing: Weight bearing as tolerated    Mobility  Bed Mobility               General bed mobility comments: Not tested; total assist to reposition upward in bed, as pt had slid so far down in bed pt could not straighten legs  Transfers                    Ambulation/Gait                  Stairs             Wheelchair Mobility    Modified Rankin (Stroke Patients Only)       Balance                                            Cognition Arousal/Alertness: Awake/alert Behavior During Therapy: WFL for tasks assessed/performed Overall Cognitive Status: Within Functional Limits for tasks assessed                                 General Comments: Pt becomes increasingly hyperverbal repeating " get me out of here, I need to see my dog" and "oh it hurts it hurts it hurts"      Exercises General Exercises - Lower Extremity Ankle Circles/Pumps: AROM;Both;15 reps Quad Sets: Strengthening;Both;10 reps Gluteal Sets: Strengthening;Both;10 reps Short Arc Quad: AAROM;Left;15 reps Heel Slides: AAROM;Left;15 reps Hip ABduction/ADduction: AAROM;Left;15 reps Straight Leg Raises: AAROM;Left;15 reps    General Comments        Pertinent Vitals/Pain Pain Assessment: Faces Faces Pain Scale: Hurts whole lot Pain Descriptors / Indicators: Aching;Constant;Grimacing;Guarding;Moaning Pain Intervention(s): Limited activity within patient's tolerance;Premedicated before session    Home Living  Prior Function            PT Goals (current goals can now be found in the care plan section) Progress towards PT goals: Progressing toward goals(very slowly)    Frequency    7X/week      PT Plan Current plan remains appropriate    Co-evaluation              AM-PAC PT "6 Clicks" Mobility   Outcome Measure  Help needed turning from your back to your side while in a flat bed without using bedrails?: Total Help needed moving from lying on your back to sitting on the side of a flat bed without using bedrails?: Total Help needed moving to and from a bed to a chair (including a wheelchair)?: Total Help needed standing up from a chair using your arms (e.g., wheelchair or bedside chair)?: Total Help  needed to walk in hospital room?: Total Help needed climbing 3-5 steps with a railing? : Total 6 Click Score: 6    End of Session   Activity Tolerance: Patient limited by pain Patient left: in bed;with call bell/phone within reach;with bed alarm set   PT Visit Diagnosis: Muscle weakness (generalized) (M62.81);Difficulty in walking, not elsewhere classified (R26.2);Pain Pain - Right/Left: Left Pain - part of body: Hip     Time: [id number] PT Time Calculation (min) (ACUTE ONLY): 15 min  Charges:  $Therapeutic Exercise: 8-22 mins                      Larae Grooms, PTA 11/02/2018, 11:16 AM

## 2018-11-02 NOTE — Progress Notes (Signed)
   Subjective: 5 Days Post-Op Procedure(s) (LRB): INTRAMEDULLARY (IM) NAIL INTERTROCHANTRIC LEFT LONG (Left) Patient reports pain as mild, improving Patient is well, and has had no acute complaints or problems Denies any CP, SOB, ABD pain. We will continue therapy today.  Plan is to go Skilled nursing facility after hospital stay.  Objective: Vital signs in last 24 hours: Temp:  [98.3 F (36.8 C)-98.7 F (37.1 C)] 98.3 F (36.8 C) (08/25 0728) Pulse Rate:  [74-83] 79 (08/25 0728) Resp:  [16-18] 18 (08/25 0728) BP: (155-166)/(57-76) 156/62 (08/25 0728) SpO2:  [95 %-96 %] 95 % (08/25 0728)  Intake/Output from previous day: 08/24 0701 - 08/25 0700 In: 780 [P.O.:780] Out: 2350 [Urine:2350] Intake/Output this shift: No intake/output data recorded.  Recent Labs    10/31/18 0447  HGB 8.1*   Recent Labs    10/31/18 0447  WBC 8.9  RBC 2.66*  HCT 25.1*  PLT 235   Recent Labs    10/31/18 0447 11/01/18 0453  NA 138 137  K 4.2 4.3  CL 108 103  CO2 26 27  BUN 27* 23  CREATININE 1.06* 0.90  GLUCOSE 102* 103*  CALCIUM 8.9 9.2   No results for input(s): LABPT, INR in the last 72 hours.  EXAM General - Patient is Alert, Appropriate and Oriented Extremity - Neurovascular intact Sensation intact distally Intact pulses distally Dorsiflexion/Plantar flexion intact No cellulitis present Compartment soft Dressing - scant drainage Motor Function - intact, moving foot and toes well on exam.   Past Medical History:  Diagnosis Date  . Anxiety 12/2002  . Arthritis    hands  . Cancer ([place])    breast left arm  . Cancer ([place])    uterine  . Diabetes mellitus 11/2004   Type 2  . Dyspnea    WITH EXERTION  . GERD (gastroesophageal reflux disease)   . Hyperlipidemia 11/2004  . Hypertension 12/2002  . Hypothyroidism   . Osteopenia 02/2002   02/2002 Dexa osteopenia// Dexa 02/26/2004 (Dr. Ubaldo Glassing) stable/IMR femur/spine  . Spinal stenosis   . 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 Ronnald Collum endocrinologist  . Vertigo     Assessment/Plan:   5 Days Post-Op Procedure(s) (LRB): INTRAMEDULLARY (IM) NAIL INTERTROCHANTRIC LEFT LONG (Left) Active Problems:   Comminuted fracture of left hip (HCC)  Estimated body mass index is 20.58 kg/m as calculated from the following:   Height as of this encounter: 5\' 3"  (1.6 m).   Weight as of this encounter: 52.7 kg. Advance diet Up with therapy  Acute post op blood loss anemia - Hgb Stable.  Continue with iron supplement CM to assist with discharge to skilled nursing facility  Patient will need Lovenox 40 mg subcu daily x14 days at discharge TED hose bilateral lower extremity x6 weeks Follow-up with [facility] orthopedics in 2 weeks for staple removal   DVT Prophylaxis - Lovenox, TED hose and SCDs Weight-Bearing as tolerated to left leg   T. Rachelle Hora, PA-C [place] 11/02/2018, 8:42 AM

## 2018-11-03 DIAGNOSIS — Z7189 Other specified counseling: Secondary | ICD-10-CM | POA: Diagnosis not present

## 2018-11-03 DIAGNOSIS — N3281 Overactive bladder: Secondary | ICD-10-CM | POA: Diagnosis not present

## 2018-11-03 DIAGNOSIS — N139 Obstructive and reflux uropathy, unspecified: Secondary | ICD-10-CM | POA: Diagnosis not present

## 2018-11-03 DIAGNOSIS — M8580 Other specified disorders of bone density and structure, unspecified site: Secondary | ICD-10-CM | POA: Diagnosis present

## 2018-11-03 DIAGNOSIS — R52 Pain, unspecified: Secondary | ICD-10-CM | POA: Diagnosis not present

## 2018-11-03 DIAGNOSIS — E039 Hypothyroidism, unspecified: Secondary | ICD-10-CM | POA: Diagnosis not present

## 2018-11-03 DIAGNOSIS — E559 Vitamin D deficiency, unspecified: Secondary | ICD-10-CM | POA: Diagnosis present

## 2018-11-03 DIAGNOSIS — Z9012 Acquired absence of left breast and nipple: Secondary | ICD-10-CM | POA: Diagnosis not present

## 2018-11-03 DIAGNOSIS — H3530 Unspecified macular degeneration: Secondary | ICD-10-CM | POA: Diagnosis not present

## 2018-11-03 DIAGNOSIS — Z7901 Long term (current) use of anticoagulants: Secondary | ICD-10-CM | POA: Diagnosis not present

## 2018-11-03 DIAGNOSIS — M25551 Pain in right hip: Secondary | ICD-10-CM | POA: Diagnosis not present

## 2018-11-03 DIAGNOSIS — Z20828 Contact with and (suspected) exposure to other viral communicable diseases: Secondary | ICD-10-CM | POA: Diagnosis present

## 2018-11-03 DIAGNOSIS — D539 Nutritional anemia, unspecified: Secondary | ICD-10-CM | POA: Diagnosis not present

## 2018-11-03 DIAGNOSIS — M6282 Rhabdomyolysis: Secondary | ICD-10-CM | POA: Diagnosis not present

## 2018-11-03 DIAGNOSIS — M19041 Primary osteoarthritis, right hand: Secondary | ICD-10-CM | POA: Diagnosis present

## 2018-11-03 DIAGNOSIS — Z466 Encounter for fitting and adjustment of urinary device: Secondary | ICD-10-CM | POA: Diagnosis not present

## 2018-11-03 DIAGNOSIS — S32039A Unspecified fracture of third lumbar vertebra, initial encounter for closed fracture: Secondary | ICD-10-CM | POA: Diagnosis present

## 2018-11-03 DIAGNOSIS — E785 Hyperlipidemia, unspecified: Secondary | ICD-10-CM | POA: Diagnosis present

## 2018-11-03 DIAGNOSIS — I480 Paroxysmal atrial fibrillation: Secondary | ICD-10-CM | POA: Diagnosis not present

## 2018-11-03 DIAGNOSIS — M1990 Unspecified osteoarthritis, unspecified site: Secondary | ICD-10-CM | POA: Diagnosis not present

## 2018-11-03 DIAGNOSIS — S79929A Unspecified injury of unspecified thigh, initial encounter: Secondary | ICD-10-CM | POA: Diagnosis not present

## 2018-11-03 DIAGNOSIS — R5381 Other malaise: Secondary | ICD-10-CM | POA: Diagnosis not present

## 2018-11-03 DIAGNOSIS — Z23 Encounter for immunization: Secondary | ICD-10-CM | POA: Diagnosis not present

## 2018-11-03 DIAGNOSIS — S0990XA Unspecified injury of head, initial encounter: Secondary | ICD-10-CM | POA: Diagnosis not present

## 2018-11-03 DIAGNOSIS — D62 Acute posthemorrhagic anemia: Secondary | ICD-10-CM | POA: Diagnosis not present

## 2018-11-03 DIAGNOSIS — L89154 Pressure ulcer of sacral region, stage 4: Secondary | ICD-10-CM | POA: Diagnosis present

## 2018-11-03 DIAGNOSIS — Z7401 Bed confinement status: Secondary | ICD-10-CM | POA: Diagnosis not present

## 2018-11-03 DIAGNOSIS — S72144A Nondisplaced intertrochanteric fracture of right femur, initial encounter for closed fracture: Secondary | ICD-10-CM | POA: Diagnosis present

## 2018-11-03 DIAGNOSIS — S72002D Fracture of unspecified part of neck of left femur, subsequent encounter for closed fracture with routine healing: Secondary | ICD-10-CM | POA: Diagnosis not present

## 2018-11-03 DIAGNOSIS — D649 Anemia, unspecified: Secondary | ICD-10-CM | POA: Diagnosis not present

## 2018-11-03 DIAGNOSIS — W19XXXA Unspecified fall, initial encounter: Secondary | ICD-10-CM | POA: Diagnosis not present

## 2018-11-03 DIAGNOSIS — S72002A Fracture of unspecified part of neck of left femur, initial encounter for closed fracture: Secondary | ICD-10-CM | POA: Diagnosis not present

## 2018-11-03 DIAGNOSIS — K219 Gastro-esophageal reflux disease without esophagitis: Secondary | ICD-10-CM | POA: Diagnosis present

## 2018-11-03 DIAGNOSIS — S72001A Fracture of unspecified part of neck of right femur, initial encounter for closed fracture: Secondary | ICD-10-CM | POA: Diagnosis not present

## 2018-11-03 DIAGNOSIS — I4891 Unspecified atrial fibrillation: Secondary | ICD-10-CM | POA: Diagnosis not present

## 2018-11-03 DIAGNOSIS — M48061 Spinal stenosis, lumbar region without neurogenic claudication: Secondary | ICD-10-CM | POA: Diagnosis present

## 2018-11-03 DIAGNOSIS — S31000A Unspecified open wound of lower back and pelvis without penetration into retroperitoneum, initial encounter: Secondary | ICD-10-CM | POA: Diagnosis not present

## 2018-11-03 DIAGNOSIS — F419 Anxiety disorder, unspecified: Secondary | ICD-10-CM | POA: Diagnosis not present

## 2018-11-03 DIAGNOSIS — M19042 Primary osteoarthritis, left hand: Secondary | ICD-10-CM | POA: Diagnosis present

## 2018-11-03 DIAGNOSIS — W19XXXD Unspecified fall, subsequent encounter: Secondary | ICD-10-CM | POA: Diagnosis not present

## 2018-11-03 DIAGNOSIS — M545 Low back pain: Secondary | ICD-10-CM | POA: Diagnosis not present

## 2018-11-03 DIAGNOSIS — Z66 Do not resuscitate: Secondary | ICD-10-CM | POA: Diagnosis not present

## 2018-11-03 DIAGNOSIS — S3992XA Unspecified injury of lower back, initial encounter: Secondary | ICD-10-CM | POA: Diagnosis not present

## 2018-11-03 DIAGNOSIS — N179 Acute kidney failure, unspecified: Secondary | ICD-10-CM | POA: Diagnosis not present

## 2018-11-03 DIAGNOSIS — Z515 Encounter for palliative care: Secondary | ICD-10-CM | POA: Diagnosis not present

## 2018-11-03 DIAGNOSIS — I6529 Occlusion and stenosis of unspecified carotid artery: Secondary | ICD-10-CM | POA: Diagnosis not present

## 2018-11-03 DIAGNOSIS — N390 Urinary tract infection, site not specified: Secondary | ICD-10-CM | POA: Diagnosis not present

## 2018-11-03 DIAGNOSIS — S79911A Unspecified injury of right hip, initial encounter: Secondary | ICD-10-CM | POA: Diagnosis not present

## 2018-11-03 DIAGNOSIS — S72001D Fracture of unspecified part of neck of right femur, subsequent encounter for closed fracture with routine healing: Secondary | ICD-10-CM | POA: Diagnosis not present

## 2018-11-03 DIAGNOSIS — D72829 Elevated white blood cell count, unspecified: Secondary | ICD-10-CM | POA: Diagnosis not present

## 2018-11-03 DIAGNOSIS — S199XXA Unspecified injury of neck, initial encounter: Secondary | ICD-10-CM | POA: Diagnosis not present

## 2018-11-03 DIAGNOSIS — Z03818 Encounter for observation for suspected exposure to other biological agents ruled out: Secondary | ICD-10-CM | POA: Diagnosis not present

## 2018-11-03 DIAGNOSIS — M8589 Other specified disorders of bone density and structure, multiple sites: Secondary | ICD-10-CM | POA: Diagnosis not present

## 2018-11-03 DIAGNOSIS — E1136 Type 2 diabetes mellitus with diabetic cataract: Secondary | ICD-10-CM | POA: Diagnosis present

## 2018-11-03 DIAGNOSIS — Y92129 Unspecified place in nursing home as the place of occurrence of the external cause: Secondary | ICD-10-CM | POA: Diagnosis not present

## 2018-11-03 DIAGNOSIS — G9341 Metabolic encephalopathy: Secondary | ICD-10-CM | POA: Diagnosis present

## 2018-11-03 DIAGNOSIS — S72141A Displaced intertrochanteric fracture of right femur, initial encounter for closed fracture: Secondary | ICD-10-CM | POA: Diagnosis not present

## 2018-11-03 DIAGNOSIS — W1830XA Fall on same level, unspecified, initial encounter: Secondary | ICD-10-CM | POA: Diagnosis present

## 2018-11-03 DIAGNOSIS — M25552 Pain in left hip: Secondary | ICD-10-CM | POA: Diagnosis not present

## 2018-11-03 DIAGNOSIS — M2550 Pain in unspecified joint: Secondary | ICD-10-CM | POA: Diagnosis not present

## 2018-11-03 DIAGNOSIS — E1140 Type 2 diabetes mellitus with diabetic neuropathy, unspecified: Secondary | ICD-10-CM | POA: Diagnosis not present

## 2018-11-03 DIAGNOSIS — Z803 Family history of malignant neoplasm of breast: Secondary | ICD-10-CM | POA: Diagnosis not present

## 2018-11-03 DIAGNOSIS — Z8542 Personal history of malignant neoplasm of other parts of uterus: Secondary | ICD-10-CM | POA: Diagnosis not present

## 2018-11-03 DIAGNOSIS — S72142D Displaced intertrochanteric fracture of left femur, subsequent encounter for closed fracture with routine healing: Secondary | ICD-10-CM | POA: Diagnosis not present

## 2018-11-03 DIAGNOSIS — Z853 Personal history of malignant neoplasm of breast: Secondary | ICD-10-CM | POA: Diagnosis not present

## 2018-11-03 DIAGNOSIS — L309 Dermatitis, unspecified: Secondary | ICD-10-CM | POA: Diagnosis present

## 2018-11-03 DIAGNOSIS — I10 Essential (primary) hypertension: Secondary | ICD-10-CM | POA: Diagnosis not present

## 2018-11-03 LAB — GLUCOSE, CAPILLARY: Glucose-Capillary: 98 mg/dL (ref 70–99)

## 2018-11-03 NOTE — Progress Notes (Signed)
   Subjective: 6 Days Post-Op Procedure(s) (LRB): INTRAMEDULLARY (IM) NAIL INTERTROCHANTRIC LEFT LONG (Left) Patient reports pain as mild. Patient is well, and has had no acute complaints or problems Denies any CP, SOB, ABD pain. We will continue therapy today.  Plan is to go Skilled nursing facility after hospital stay.  Objective: Vital signs in last 24 hours: Temp:  [98.1 F (36.7 C)-98.5 F (36.9 C)] 98.2 F (36.8 C) (08/26 0810) Pulse Rate:  [70-88] 70 (08/26 0810) Resp:  [17-18] 18 (08/25 2344) BP: (139-165)/(60-81) 165/81 (08/26 0810) SpO2:  [94 %-100 %] 100 % (08/26 0810)  Intake/Output from previous day: 08/25 0701 - 08/26 0700 In: 360 [P.O.:360] Out: 1250 [Urine:1250] Intake/Output this shift: No intake/output data recorded.  No results for input(s): HGB in the last 72 hours. No results for input(s): WBC, RBC, HCT, PLT in the last 72 hours. Recent Labs    11/01/18 0453  NA 137  K 4.3  CL 103  CO2 27  BUN 23  CREATININE 0.90  GLUCOSE 103*  CALCIUM 9.2   No results for input(s): LABPT, INR in the last 72 hours.  EXAM General - Patient is Alert, Appropriate and Oriented Extremity - Neurovascular intact Sensation intact distally Intact pulses distally Dorsiflexion/Plantar flexion intact No cellulitis present Compartment soft Dressing - scant drainage Motor Function - intact, moving foot and toes well on exam.   Past Medical History:  Diagnosis Date  . Anxiety 12/2002  . Arthritis    hands  . Cancer ([place])    breast left arm  . Cancer ([place])    uterine  . Diabetes mellitus 11/2004   Type 2  . Dyspnea    WITH EXERTION  . GERD (gastroesophageal reflux disease)   . Hyperlipidemia 11/2004  . Hypertension 12/2002  . Hypothyroidism   . Osteopenia 02/2002   02/2002 Dexa osteopenia// Dexa 02/26/2004 (Dr. Ubaldo Glassing) stable/IMR femur/spine  . Spinal stenosis   . 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 Ronnald Collum  endocrinologist  . Vertigo     Assessment/Plan:   6 Days Post-Op Procedure(s) (LRB): INTRAMEDULLARY (IM) NAIL INTERTROCHANTRIC LEFT LONG (Left) Active Problems:   Comminuted fracture of left hip (HCC)  Estimated body mass index is 20.58 kg/m as calculated from the following:   Height as of this encounter: 5\' 3"  (1.6 m).   Weight as of this encounter: 52.7 kg. Advance diet Up with therapy  Acute post op blood loss anemia - Hgb Stable.  Continue with iron supplement CM to assist with discharge to skilled nursing facility, COVID test negative  Patient will need Lovenox 40 mg subcu daily x14 days at discharge TED hose bilateral lower extremity x6 weeks Follow-up with [facility] orthopedics in 2 weeks for staple removal   DVT Prophylaxis - Lovenox, TED hose and SCDs Weight-Bearing as tolerated to left leg   T. Rachelle Hora, PA-C [place] 11/03/2018, 8:17 AM

## 2018-11-03 NOTE — TOC Transition Note (Signed)
Transition of Care [facility]) - CM/SW Discharge Note   Patient Details  Name: ALYSSAH WARE MRN: [id number] Date of Birth: June 22, 1926  [facility]) CM/SW Contact:  Su Hilt, RN Phone Number: 11/03/2018, 9:20 AM   Clinical Narrative:     Patient to DC to [place] room 508 today, The bedside nurse to call report to [phone] and call EMS to transport [name] the patients friend has been made aware, she stated she was going to go to the patients house and get her things to take to her there at [facility]  Final next level of care: [place] Barriers to Discharge: Barriers Resolved   Patient Goals and CMS Choice Patient states their goals for this hospitalization and ongoing recovery are:: get better      Discharge Placement                       Discharge Plan and Services                                     Social Determinants of Health (SDOH) Interventions     Readmission Risk Interventions No flowsheet data found.

## 2018-11-03 NOTE — Discharge Summary (Signed)
[place] [facility] [place] NAME: [name]    MR#:  [id number]  DATE OF BIRTH:  1926-08-03  DATE OF ADMISSION:  10/27/2018   ADMITTING PHYSICIAN: Dustin Flock, MD  DATE OF DISCHARGE: 11/03/2018  PRIMARY CARE PHYSICIAN: McLean-Scocuzza, Nino Glow, MD   ADMISSION DIAGNOSIS:   Dehydration [E86.0] Fall [W19.XXXA] AKI (acute kidney injury) ([place]) [N17.9] Closed fracture of left hip, initial encounter ([place]) [S72.002A] Fall, initial encounter [W19.XXXA]  DISCHARGE DIAGNOSIS:   Active Problems:   Comminuted fracture of left hip (HCC) COVID neg   SECONDARY DIAGNOSIS:   Past Medical History:  Diagnosis Date  . Anxiety 12/2002  . Arthritis    hands  . Cancer ([place])    breast left arm  . Cancer ([place])    uterine  . Diabetes mellitus 11/2004   Type 2  . Dyspnea    WITH EXERTION  . GERD (gastroesophageal reflux disease)   . Hyperlipidemia 11/2004  . Hypertension 12/2002  . Hypothyroidism   . Osteopenia 02/2002   02/2002 Dexa osteopenia// Dexa 02/26/2004 (Dr. Ubaldo Glassing) stable/IMR femur/spine  . Spinal stenosis   . 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 Ronnald Collum endocrinologist  . Vertigo     HOSPITAL COURSE:   [name] with PMH significant for breast cancer, HTN, arthritis, hypothyroidism brought from home secondary to fall and left hip pain. Was noted to have afib in ED  1. Transient afib with rvr- converted to NSR with IV cardizem, not on any drips - appreciate cardiology consult -since bradycardic, discontinued oral metoprolol which is not her home medication - No indication for long term anti coagulation if  remains in normal sinus rhythm -stable on cardiac monitoring for now  2. Fall and left hip intertrochanteric fracture- appreciate ortho consult - s/p ORIF with Im nail -  pain control as needed  - PT  Has seen the patient, recommending SNF  3. ARF- from fall and rhabdo- improved with IV  fluids -Encourage oral nutrition,   4.  Hypertension-restarted losartan.  Continue to monitor closely  5. Acute on chronic anemia- acute post op blood loss anemia, no indication for transfusion . Monitor for now -Keep hemoglobin greater than 8  6. DVT prophylaxis- on lovenox for 2 weeks   PT consult- need rehab at discharge. discharge to [facility] today  S[place] test is negative DISCHARGE CONDITIONS:   Guarded  CONSULTS OBTAINED:   Treatment Team:  Hessie Knows, MD  DRUG ALLERGIES:   Allergies  Allergen Reactions  . Amlodipine Other (See Comments)    Leg edema   . Atenolol Other (See Comments)    REACTION: bradycardia  . Erythromycin Base Other (See Comments)    REACTION: stomach upset  . Lisinopril Other (See Comments)    REACTION: cough  (20 mg)   DISCHARGE MEDICATIONS:   Allergies as of 11/03/2018      Reactions   Amlodipine Other (See Comments)   Leg edema    Atenolol Other (See Comments)   REACTION: bradycardia   Erythromycin Base Other (See Comments)   REACTION: stomach upset   Lisinopril Other (See Comments)   REACTION: cough  (20 mg)      Medication List    STOP taking these medications   diclofenac sodium 1 % Gel Commonly known as: VOLTAREN   losartan-hydrochlorothiazide 100-25 MG tablet Commonly known as: HYZAAR     TAKE these medications   aspirin 81 MG tablet  Take 81 mg by mouth daily. Daily with food   Cholecalciferol 1.25 MG (50000 UT) capsule Take 1 capsule (50,000 Units total) by mouth once a week.   enoxaparin 40 MG/0.4ML injection Commonly known as: Lovenox Inject 0.4 mLs (40 mg total) into the skin daily for 14 days.   gabapentin 100 MG capsule Commonly known as: NEURONTIN TAKE ONE (1) CAPSULE BY MOUTH 2 TIMES DAILY   HYDROcodone-acetaminophen 5-325 MG tablet Commonly known as: NORCO/VICODIN Take 1-2 tablets by mouth every 4 (four) hours as needed for moderate pain.   levothyroxine 50 MCG tablet Commonly  known as: SYNTHROID Take 1 tablet (50 mcg total) by mouth daily.   losartan 100 MG tablet Commonly known as: COZAAR Take 1 tablet (100 mg total) by mouth daily.   mometasone 0.1 % cream Commonly known as: Elocon Apply 1 application topically daily. To affected areas behind ears   polyethylene glycol powder 17 GM/SCOOP powder Commonly known as: GLYCOLAX/MIRALAX Take 17 g by mouth daily as needed. As directed for constipation   vitamin C 500 MG tablet Commonly known as: ASCORBIC ACID Take 500 mg by mouth daily.        DISCHARGE INSTRUCTIONS:   1.  PCP follow-up in 3 to 4 days 2.  Orthopedics follow-up in 2 weeks  DIET:   Cardiac diet  ACTIVITY:   Activity as tolerated  OXYGEN:   Home Oxygen: No.  Oxygen Delivery: room air  DISCHARGE LOCATION:   nursing home   If you experience worsening of your admission symptoms, develop shortness of breath, life threatening emergency, suicidal or homicidal thoughts you must seek medical attention immediately by calling 911 or calling your MD immediately  if symptoms less severe.  You Must read complete instructions/literature along with all the possible adverse reactions/side effects for all the Medicines you take and that have been prescribed to you. Take any new Medicines after you have completely understood and accpet all the possible adverse reactions/side effects.   Please note  You were cared for by a hospitalist during your hospital stay. If you have any questions about your discharge medications or the care you received while you were in the hospital after you are discharged, you can call the unit and asked to speak with the hospitalist on call if the hospitalist that took care of you is not available. Once you are discharged, your primary care physician will handle any further medical issues. Please note that NO REFILLS for any discharge medications will be authorized once you are discharged, as it is imperative that you  return to your primary care physician (or establish a relationship with a primary care physician if you do not have one) for your aftercare needs so that they can reassess your need for medications and monitor your lab values.    On the day of Discharge:  VITAL SIGNS:   Blood pressure (!) 165/81, pulse 70, temperature 98.2 F (36.8 C), temperature source Oral, resp. rate 18, height 5\' 3"  (1.6 m), weight 52.7 kg, SpO2 100 %.  PHYSICAL EXAMINATION:    GENERAL:  85 y.o.-year-old patient lying in the bed with no acute distress.  EYES: Pupils equal, round, reactive to light and accommodation. No scleral icterus. Extraocular muscles intact.  HEENT: Head atraumatic, normocephalic. Oropharynx and nasopharynx clear.  NECK:  Supple, no jugular venous distention. No thyroid enlargement, no tenderness.  LUNGS: Normal breath sounds bilaterally, no wheezing, rales,rhonchi or crepitation. No use of accessory muscles of respiration. Decreased bibasilar breath sounds S/p  left mastectomy CARDIOVASCULAR: S1, S2 normal. No  rubs, or gallops. 3/6 systolic murmur present ABDOMEN: Soft, nontender, nondistended. Bowel sounds present.   EXTREMITIES: No pedal edema, cyanosis, or clubbing. Left hip dressing in place, there is swelling, old blood  and tenderness around the proximal honey comb dressing NEUROLOGIC: Cranial nerves II through XII are intact. Muscle strength good in all extremities except limited left hip flexion due to pain. Sensation intact. Gait not checked.  PSYCHIATRIC: The patient is alert and oriented x 3.  SKIN: No obvious rash, lesion, or ulcer.   DATA REVIEW:   CBC Recent Labs  Lab 10/31/18 0447  WBC 8.9  HGB 8.1*  HCT 25.1*  PLT 235    Chemistries  Recent Labs  Lab 10/27/18 1115  11/01/18 0453  NA 138   < > 137  K 3.6   < > 4.3  CL 102   < > 103  CO2 25   < > 27  GLUCOSE 149*   < > 103*  BUN 45*   < > 23  CREATININE 2.11*   < > 0.90  CALCIUM 9.1   < > 9.2  MG  --    < >  2.0  AST 72*  --   --   ALT 37  --   --   ALKPHOS 58  --   --   BILITOT 1.4*  --   --    < > = values in this interval not displayed.     Microbiology Results  Results for orders placed or performed during the hospital encounter of 10/27/18  SARS Coronavirus 2 [facility] order, Performed in [facility] hospital lab) Nasopharyngeal Nasopharyngeal Swab     Status: None   Collection Time: 10/27/18 12:16 PM   Specimen: Nasopharyngeal Swab  Result Value Ref Range Status   SARS Coronavirus 2 NEGATIVE NEGATIVE Final    Comment: (NOTE) If result is NEGATIVE SARS-CoV-2 target nucleic acids are NOT DETECTED. The SARS-CoV-2 RNA is generally detectable in upper and lower  respiratory specimens during the acute phase of infection. The lowest  concentration of SARS-CoV-2 viral copies this assay can detect is 250  copies / mL. A negative result does not preclude SARS-CoV-2 infection  and should not be used as the sole basis for treatment or other  patient management decisions.  A negative result may occur with  improper specimen collection / handling, submission of specimen other  than nasopharyngeal swab, presence of viral mutation(s) within the  areas targeted by this assay, and inadequate number of viral copies  (<250 copies / mL). A negative result must be combined with clinical  observations, patient history, and epidemiological information. If result is POSITIVE SARS-CoV-2 target nucleic acids are DETECTED. The SARS-CoV-2 RNA is generally detectable in upper and lower  respiratory specimens dur ing the acute phase of infection.  Positive  results are indicative of active infection with SARS-CoV-2.  Clinical  correlation with patient history and other diagnostic information is  necessary to determine patient infection status.  Positive results do  not rule out bacterial infection or co-infection with other viruses. If result is PRESUMPTIVE POSTIVE SARS-CoV-2 nucleic acids MAY BE PRESENT.    A presumptive positive result was obtained on the submitted specimen  and confirmed on repeat testing.  While 2019 novel coronavirus  (SARS-CoV-2) nucleic acids may be present in the submitted sample  additional confirmatory testing may be necessary for epidemiological  and / or clinical management purposes  to differentiate  between  SARS-CoV-2 and other Sarbecovirus currently known to infect humans.  If clinically indicated additional testing with an alternate test  methodology [phone]) is advised. The SARS-CoV-2 RNA is generally  detectable in upper and lower respiratory sp ecimens during the acute  phase of infection. The expected result is Negative. Fact Sheet for Patients:  StrictlyIdeas.no Fact Sheet for Healthcare Providers: BankingDealers.co.za This test is not yet approved or cleared by the [place] FDA and has been authorized for detection and/or diagnosis of SARS-CoV-2 by FDA under an Emergency Use Authorization (EUA).  This EUA will remain in effect (meaning this test can be used) for the duration of the COVID-19 declaration under Section 564(b)(1) of the Act, 21 U.S.C. section 360bbb-3(b)(1), unless the authorization is terminated or revoked sooner. Performed at [facility], [address] [place], [place] [address]   MRSA PCR Screening     Status: None   Collection Time: 10/27/18  7:31 PM   Specimen: Nasal Mucosa; Nasopharyngeal  Result Value Ref Range Status   MRSA by PCR NEGATIVE NEGATIVE Final    Comment:        The GeneXpert MRSA Assay (FDA approved for NASAL specimens only), is one component of a comprehensive MRSA colonization surveillance program. It is not intended to diagnose MRSA infection nor to guide or monitor treatment for MRSA infections. Performed at [facility], [address] [place], [place] [address]   Novel Coronavirus, NAA (hospital order; send-out to ref lab)      Status: None   Collection Time: 11/01/18 11:30 AM   Specimen: Nasopharyngeal Swab; Respiratory  Result Value Ref Range Status   SARS-CoV-2, NAA NOT DETECTED NOT DETECTED Final    Comment: (NOTE) This test was developed and its performance characteristics determined by [employer]. This test has not been FDA cleared or approved. This test has been authorized by FDA under an Emergency Use Authorization (EUA). This test is only authorized for the duration of time the declaration that circumstances exist justifying the authorization of the emergency use of in vitro diagnostic tests for detection of SARS-CoV-2 virus and/or diagnosis of COVID-19 infection under section 564(b)(1) of the Act, 21 U.S.C. KA:123727), unless the authorization is terminated or revoked sooner. When diagnostic testing is negative, the possibility of a false negative result should be considered in the context of a patient's recent exposures and the presence of clinical signs and symptoms consistent with COVID-19. An individual without symptoms of COVID-19 and who is not shedding SARS-CoV-2 virus would expect to have a negative (not detected) result in this assay. Performed  At: [facility], [place] [id number] Rush Farmer MD [id number]    [place]  Final    Comment: Performed at [facility], [address] [place], [place] [address]    RADIOLOGY:  No results found.   Management plans discussed with the patient, family and they are in agreement.  CODE STATUS:     Code Status Orders  (From admission, onward)         Start     Ordered   10/27/18 1659  Full code  Continuous     10/27/18 1658        Code Status History    This patient has a current code status but no historical code status.   Advance Care Planning Activity    Advance Directive Documentation     Most Recent Value  Type of Advance Directive   Healthcare Power  of Attorney  Pre-existing out of facility DNR order (yellow form or pink MOST form)  -  "MOST" Form in Place?  -      TOTAL TIME TAKING CARE OF THIS PATIENT:41 minutes.    Nicholes Mango M.D on 11/03/2018 at 9:41 AM  Between 7am to 6pm - Pager - [phone]  After 6pm go to [email or web address] - Proofreader  [facility]  Office  [phone]  CC: Primary care physician; McLean-Scocuzza, Nino Glow, MD   Note: This dictation was prepared with Dragon dictation along with smaller phrase technology. Any transcriptional errors that result from this process are unintentional.

## 2018-11-03 NOTE — Progress Notes (Signed)
Pt. Discharged to [facility] via EMS. Report called to [facility] RN. Patient assessment unchanged from this morning. IV discontinued per policy. VSS.

## 2018-11-08 DIAGNOSIS — S72002D Fracture of unspecified part of neck of left femur, subsequent encounter for closed fracture with routine healing: Secondary | ICD-10-CM | POA: Diagnosis not present

## 2018-11-08 DIAGNOSIS — I480 Paroxysmal atrial fibrillation: Secondary | ICD-10-CM | POA: Diagnosis not present

## 2018-11-08 DIAGNOSIS — I10 Essential (primary) hypertension: Secondary | ICD-10-CM | POA: Diagnosis not present

## 2018-12-07 ENCOUNTER — Other Ambulatory Visit: Payer: Self-pay | Admitting: *Deleted

## 2018-12-07 NOTE — Patient Outreach (Signed)
Member assessed for potential [facility] Care Management needs as a benefit of [place] [employer].  Member is currently receiving skilled nursing therapy at [facility]. She lives at home alone prior.  Per [facility] UM, member has a sacral wound that has required treatment.  Telephone call made to facility dc planner. Spoke with Tiffany to discuss disposition plans. Tiffany reports member will return home at dc. States member's friend Shelva Majestic is assisting in arranging in home aide services thru '[facility]' agency. Tiffany reports APS case was closed last week. States original APS case was open in relation to member's son. Dc planner reports member has denied any fears or concerns related to her son. Therefore APS caseworker closed case.  Tiffany Investment banker, corporate) also reports [facility] referral will be made post SNF dc. Writer inquired whether [facility] has a palliative care program in case member is not hospice eligible. Dc planner reports member will be hospice eligible and that [facility] has a palliative program as well.   Dc planner unclear or disposition date at this time. States member is now making some progress/gains.   Discussed that writer will touch base at later time to confirm that dc plans have remained the same. Will continue to collaborate with dc planner and [facility] UM team on member while she remains in SNF.   Marthenia Rolling, MSN-Ed, RN,BSN [place] Acute Care Coordinator [phone] [facility]) [phone]  (Toll free office)

## 2018-12-08 ENCOUNTER — Ambulatory Visit: Payer: Medicare Other | Admitting: Internal Medicine

## 2018-12-09 DIAGNOSIS — Z515 Encounter for palliative care: Secondary | ICD-10-CM | POA: Diagnosis not present

## 2018-12-09 DIAGNOSIS — M48061 Spinal stenosis, lumbar region without neurogenic claudication: Secondary | ICD-10-CM | POA: Diagnosis present

## 2018-12-09 DIAGNOSIS — I480 Paroxysmal atrial fibrillation: Secondary | ICD-10-CM | POA: Diagnosis not present

## 2018-12-09 DIAGNOSIS — S72141A Displaced intertrochanteric fracture of right femur, initial encounter for closed fracture: Secondary | ICD-10-CM | POA: Diagnosis not present

## 2018-12-09 DIAGNOSIS — N3281 Overactive bladder: Secondary | ICD-10-CM | POA: Diagnosis not present

## 2018-12-09 DIAGNOSIS — Z66 Do not resuscitate: Secondary | ICD-10-CM | POA: Diagnosis not present

## 2018-12-09 DIAGNOSIS — S72144A Nondisplaced intertrochanteric fracture of right femur, initial encounter for closed fracture: Secondary | ICD-10-CM | POA: Diagnosis present

## 2018-12-09 DIAGNOSIS — M25552 Pain in left hip: Secondary | ICD-10-CM | POA: Diagnosis not present

## 2018-12-09 DIAGNOSIS — H3530 Unspecified macular degeneration: Secondary | ICD-10-CM | POA: Diagnosis not present

## 2018-12-09 DIAGNOSIS — Y92129 Unspecified place in nursing home as the place of occurrence of the external cause: Secondary | ICD-10-CM | POA: Diagnosis not present

## 2018-12-09 DIAGNOSIS — I10 Essential (primary) hypertension: Secondary | ICD-10-CM | POA: Diagnosis present

## 2018-12-09 DIAGNOSIS — M19042 Primary osteoarthritis, left hand: Secondary | ICD-10-CM | POA: Diagnosis present

## 2018-12-09 DIAGNOSIS — W19XXXD Unspecified fall, subsequent encounter: Secondary | ICD-10-CM | POA: Diagnosis not present

## 2018-12-09 DIAGNOSIS — R52 Pain, unspecified: Secondary | ICD-10-CM | POA: Diagnosis not present

## 2018-12-09 DIAGNOSIS — Z23 Encounter for immunization: Secondary | ICD-10-CM | POA: Diagnosis not present

## 2018-12-09 DIAGNOSIS — N139 Obstructive and reflux uropathy, unspecified: Secondary | ICD-10-CM | POA: Diagnosis not present

## 2018-12-09 DIAGNOSIS — M8580 Other specified disorders of bone density and structure, unspecified site: Secondary | ICD-10-CM | POA: Diagnosis present

## 2018-12-09 DIAGNOSIS — S32039A Unspecified fracture of third lumbar vertebra, initial encounter for closed fracture: Secondary | ICD-10-CM | POA: Diagnosis present

## 2018-12-09 DIAGNOSIS — Z03818 Encounter for observation for suspected exposure to other biological agents ruled out: Secondary | ICD-10-CM | POA: Diagnosis not present

## 2018-12-09 DIAGNOSIS — Z466 Encounter for fitting and adjustment of urinary device: Secondary | ICD-10-CM | POA: Diagnosis not present

## 2018-12-09 DIAGNOSIS — Z803 Family history of malignant neoplasm of breast: Secondary | ICD-10-CM | POA: Diagnosis not present

## 2018-12-09 DIAGNOSIS — D72829 Elevated white blood cell count, unspecified: Secondary | ICD-10-CM | POA: Diagnosis not present

## 2018-12-09 DIAGNOSIS — W1830XA Fall on same level, unspecified, initial encounter: Secondary | ICD-10-CM | POA: Diagnosis present

## 2018-12-09 DIAGNOSIS — D649 Anemia, unspecified: Secondary | ICD-10-CM | POA: Diagnosis not present

## 2018-12-09 DIAGNOSIS — E1140 Type 2 diabetes mellitus with diabetic neuropathy, unspecified: Secondary | ICD-10-CM | POA: Diagnosis not present

## 2018-12-09 DIAGNOSIS — E039 Hypothyroidism, unspecified: Secondary | ICD-10-CM | POA: Diagnosis present

## 2018-12-09 DIAGNOSIS — S79911A Unspecified injury of right hip, initial encounter: Secondary | ICD-10-CM | POA: Diagnosis not present

## 2018-12-09 DIAGNOSIS — Z20828 Contact with and (suspected) exposure to other viral communicable diseases: Secondary | ICD-10-CM | POA: Diagnosis present

## 2018-12-09 DIAGNOSIS — S199XXA Unspecified injury of neck, initial encounter: Secondary | ICD-10-CM | POA: Diagnosis not present

## 2018-12-09 DIAGNOSIS — S79929A Unspecified injury of unspecified thigh, initial encounter: Secondary | ICD-10-CM | POA: Diagnosis not present

## 2018-12-09 DIAGNOSIS — S72142D Displaced intertrochanteric fracture of left femur, subsequent encounter for closed fracture with routine healing: Secondary | ICD-10-CM | POA: Diagnosis not present

## 2018-12-09 DIAGNOSIS — D62 Acute posthemorrhagic anemia: Secondary | ICD-10-CM | POA: Diagnosis not present

## 2018-12-09 DIAGNOSIS — Z9012 Acquired absence of left breast and nipple: Secondary | ICD-10-CM | POA: Diagnosis not present

## 2018-12-09 DIAGNOSIS — S72001A Fracture of unspecified part of neck of right femur, initial encounter for closed fracture: Secondary | ICD-10-CM | POA: Diagnosis not present

## 2018-12-09 DIAGNOSIS — Z853 Personal history of malignant neoplasm of breast: Secondary | ICD-10-CM | POA: Diagnosis not present

## 2018-12-09 DIAGNOSIS — K219 Gastro-esophageal reflux disease without esophagitis: Secondary | ICD-10-CM | POA: Diagnosis present

## 2018-12-09 DIAGNOSIS — M19041 Primary osteoarthritis, right hand: Secondary | ICD-10-CM | POA: Diagnosis present

## 2018-12-09 DIAGNOSIS — L89154 Pressure ulcer of sacral region, stage 4: Secondary | ICD-10-CM | POA: Diagnosis present

## 2018-12-09 DIAGNOSIS — G9341 Metabolic encephalopathy: Secondary | ICD-10-CM | POA: Diagnosis present

## 2018-12-09 DIAGNOSIS — S0990XA Unspecified injury of head, initial encounter: Secondary | ICD-10-CM | POA: Diagnosis not present

## 2018-12-09 DIAGNOSIS — Z8542 Personal history of malignant neoplasm of other parts of uterus: Secondary | ICD-10-CM | POA: Diagnosis not present

## 2018-12-09 DIAGNOSIS — L309 Dermatitis, unspecified: Secondary | ICD-10-CM | POA: Diagnosis present

## 2018-12-09 DIAGNOSIS — E785 Hyperlipidemia, unspecified: Secondary | ICD-10-CM | POA: Diagnosis present

## 2018-12-09 DIAGNOSIS — M8589 Other specified disorders of bone density and structure, multiple sites: Secondary | ICD-10-CM | POA: Diagnosis not present

## 2018-12-09 DIAGNOSIS — M25551 Pain in right hip: Secondary | ICD-10-CM | POA: Diagnosis not present

## 2018-12-09 DIAGNOSIS — F419 Anxiety disorder, unspecified: Secondary | ICD-10-CM | POA: Diagnosis not present

## 2018-12-09 DIAGNOSIS — I6529 Occlusion and stenosis of unspecified carotid artery: Secondary | ICD-10-CM | POA: Diagnosis not present

## 2018-12-09 DIAGNOSIS — S72001D Fracture of unspecified part of neck of right femur, subsequent encounter for closed fracture with routine healing: Secondary | ICD-10-CM | POA: Diagnosis not present

## 2018-12-09 DIAGNOSIS — Z7901 Long term (current) use of anticoagulants: Secondary | ICD-10-CM | POA: Diagnosis not present

## 2018-12-09 DIAGNOSIS — Z7189 Other specified counseling: Secondary | ICD-10-CM | POA: Diagnosis not present

## 2018-12-09 DIAGNOSIS — E1136 Type 2 diabetes mellitus with diabetic cataract: Secondary | ICD-10-CM | POA: Diagnosis present

## 2018-12-09 DIAGNOSIS — D539 Nutritional anemia, unspecified: Secondary | ICD-10-CM | POA: Diagnosis not present

## 2018-12-09 DIAGNOSIS — E559 Vitamin D deficiency, unspecified: Secondary | ICD-10-CM | POA: Diagnosis present

## 2018-12-09 DIAGNOSIS — M1990 Unspecified osteoarthritis, unspecified site: Secondary | ICD-10-CM | POA: Diagnosis not present

## 2018-12-09 DIAGNOSIS — S31000A Unspecified open wound of lower back and pelvis without penetration into retroperitoneum, initial encounter: Secondary | ICD-10-CM | POA: Diagnosis not present

## 2018-12-09 DIAGNOSIS — M545 Low back pain: Secondary | ICD-10-CM | POA: Diagnosis not present

## 2018-12-09 DIAGNOSIS — S3992XA Unspecified injury of lower back, initial encounter: Secondary | ICD-10-CM | POA: Diagnosis not present

## 2018-12-12 ENCOUNTER — Emergency Department: Payer: Medicare Other

## 2018-12-12 ENCOUNTER — Other Ambulatory Visit: Payer: Self-pay

## 2018-12-12 ENCOUNTER — Encounter: Payer: Self-pay | Admitting: Emergency Medicine

## 2018-12-12 ENCOUNTER — Inpatient Hospital Stay
Admission: EM | Admit: 2018-12-12 | Discharge: 2018-12-15 | DRG: 480 | Disposition: A | Discharge status: Skilled Nursing Facility | Payer: Medicare Other | Source: Skilled Nursing Facility | Attending: Internal Medicine | Admitting: Internal Medicine

## 2018-12-12 DIAGNOSIS — M19041 Primary osteoarthritis, right hand: Secondary | ICD-10-CM | POA: Diagnosis present

## 2018-12-12 DIAGNOSIS — Z825 Family history of asthma and other chronic lower respiratory diseases: Secondary | ICD-10-CM

## 2018-12-12 DIAGNOSIS — Z20828 Contact with and (suspected) exposure to other viral communicable diseases: Secondary | ICD-10-CM | POA: Diagnosis present

## 2018-12-12 DIAGNOSIS — Z466 Encounter for fitting and adjustment of urinary device: Secondary | ICD-10-CM | POA: Diagnosis not present

## 2018-12-12 DIAGNOSIS — I10 Essential (primary) hypertension: Secondary | ICD-10-CM | POA: Diagnosis present

## 2018-12-12 DIAGNOSIS — Z419 Encounter for procedure for purposes other than remedying health state, unspecified: Secondary | ICD-10-CM

## 2018-12-12 DIAGNOSIS — E1140 Type 2 diabetes mellitus with diabetic neuropathy, unspecified: Secondary | ICD-10-CM | POA: Diagnosis not present

## 2018-12-12 DIAGNOSIS — E785 Hyperlipidemia, unspecified: Secondary | ICD-10-CM | POA: Diagnosis present

## 2018-12-12 DIAGNOSIS — F0390 Unspecified dementia without behavioral disturbance: Secondary | ICD-10-CM | POA: Diagnosis present

## 2018-12-12 DIAGNOSIS — R404 Transient alteration of awareness: Secondary | ICD-10-CM | POA: Diagnosis not present

## 2018-12-12 DIAGNOSIS — S72141A Displaced intertrochanteric fracture of right femur, initial encounter for closed fracture: Secondary | ICD-10-CM | POA: Diagnosis not present

## 2018-12-12 DIAGNOSIS — Z23 Encounter for immunization: Secondary | ICD-10-CM | POA: Diagnosis not present

## 2018-12-12 DIAGNOSIS — Z881 Allergy status to other antibiotic agents status: Secondary | ICD-10-CM

## 2018-12-12 DIAGNOSIS — S0990XA Unspecified injury of head, initial encounter: Secondary | ICD-10-CM | POA: Diagnosis not present

## 2018-12-12 DIAGNOSIS — M1990 Unspecified osteoarthritis, unspecified site: Secondary | ICD-10-CM | POA: Diagnosis not present

## 2018-12-12 DIAGNOSIS — Z7901 Long term (current) use of anticoagulants: Secondary | ICD-10-CM

## 2018-12-12 DIAGNOSIS — E559 Vitamin D deficiency, unspecified: Secondary | ICD-10-CM | POA: Diagnosis present

## 2018-12-12 DIAGNOSIS — D72829 Elevated white blood cell count, unspecified: Secondary | ICD-10-CM | POA: Diagnosis not present

## 2018-12-12 DIAGNOSIS — D509 Iron deficiency anemia, unspecified: Secondary | ICD-10-CM | POA: Diagnosis present

## 2018-12-12 DIAGNOSIS — Z811 Family history of alcohol abuse and dependence: Secondary | ICD-10-CM

## 2018-12-12 DIAGNOSIS — Z7189 Other specified counseling: Secondary | ICD-10-CM | POA: Diagnosis not present

## 2018-12-12 DIAGNOSIS — Z79890 Hormone replacement therapy (postmenopausal): Secondary | ICD-10-CM

## 2018-12-12 DIAGNOSIS — S72001D Fracture of unspecified part of neck of right femur, subsequent encounter for closed fracture with routine healing: Secondary | ICD-10-CM | POA: Diagnosis not present

## 2018-12-12 DIAGNOSIS — D539 Nutritional anemia, unspecified: Secondary | ICD-10-CM | POA: Diagnosis not present

## 2018-12-12 DIAGNOSIS — K219 Gastro-esophageal reflux disease without esophagitis: Secondary | ICD-10-CM | POA: Diagnosis present

## 2018-12-12 DIAGNOSIS — I480 Paroxysmal atrial fibrillation: Secondary | ICD-10-CM | POA: Diagnosis not present

## 2018-12-12 DIAGNOSIS — Z820 Family history of epilepsy and other diseases of the nervous system: Secondary | ICD-10-CM

## 2018-12-12 DIAGNOSIS — Z515 Encounter for palliative care: Secondary | ICD-10-CM

## 2018-12-12 DIAGNOSIS — Z8542 Personal history of malignant neoplasm of other parts of uterus: Secondary | ICD-10-CM

## 2018-12-12 DIAGNOSIS — S72144A Nondisplaced intertrochanteric fracture of right femur, initial encounter for closed fracture: Secondary | ICD-10-CM | POA: Diagnosis present

## 2018-12-12 DIAGNOSIS — M8580 Other specified disorders of bone density and structure, unspecified site: Secondary | ICD-10-CM | POA: Diagnosis present

## 2018-12-12 DIAGNOSIS — Z03818 Encounter for observation for suspected exposure to other biological agents ruled out: Secondary | ICD-10-CM | POA: Diagnosis not present

## 2018-12-12 DIAGNOSIS — L309 Dermatitis, unspecified: Secondary | ICD-10-CM | POA: Diagnosis present

## 2018-12-12 DIAGNOSIS — S32039A Unspecified fracture of third lumbar vertebra, initial encounter for closed fracture: Secondary | ICD-10-CM | POA: Diagnosis present

## 2018-12-12 DIAGNOSIS — G9341 Metabolic encephalopathy: Secondary | ICD-10-CM | POA: Diagnosis present

## 2018-12-12 DIAGNOSIS — E039 Hypothyroidism, unspecified: Secondary | ICD-10-CM | POA: Diagnosis present

## 2018-12-12 DIAGNOSIS — E119 Type 2 diabetes mellitus without complications: Secondary | ICD-10-CM | POA: Diagnosis not present

## 2018-12-12 DIAGNOSIS — Y92129 Unspecified place in nursing home as the place of occurrence of the external cause: Secondary | ICD-10-CM | POA: Diagnosis not present

## 2018-12-12 DIAGNOSIS — M25552 Pain in left hip: Secondary | ICD-10-CM | POA: Diagnosis not present

## 2018-12-12 DIAGNOSIS — E1136 Type 2 diabetes mellitus with diabetic cataract: Secondary | ICD-10-CM | POA: Diagnosis present

## 2018-12-12 DIAGNOSIS — S79911A Unspecified injury of right hip, initial encounter: Secondary | ICD-10-CM | POA: Diagnosis not present

## 2018-12-12 DIAGNOSIS — D62 Acute posthemorrhagic anemia: Secondary | ICD-10-CM | POA: Diagnosis not present

## 2018-12-12 DIAGNOSIS — S72001A Fracture of unspecified part of neck of right femur, initial encounter for closed fracture: Secondary | ICD-10-CM | POA: Diagnosis present

## 2018-12-12 DIAGNOSIS — H3530 Unspecified macular degeneration: Secondary | ICD-10-CM | POA: Diagnosis not present

## 2018-12-12 DIAGNOSIS — W19XXXD Unspecified fall, subsequent encounter: Secondary | ICD-10-CM | POA: Diagnosis not present

## 2018-12-12 DIAGNOSIS — N3281 Overactive bladder: Secondary | ICD-10-CM | POA: Diagnosis not present

## 2018-12-12 DIAGNOSIS — S199XXA Unspecified injury of neck, initial encounter: Secondary | ICD-10-CM | POA: Diagnosis not present

## 2018-12-12 DIAGNOSIS — Z7982 Long term (current) use of aspirin: Secondary | ICD-10-CM

## 2018-12-12 DIAGNOSIS — Z803 Family history of malignant neoplasm of breast: Secondary | ICD-10-CM | POA: Diagnosis not present

## 2018-12-12 DIAGNOSIS — F419 Anxiety disorder, unspecified: Secondary | ICD-10-CM | POA: Diagnosis not present

## 2018-12-12 DIAGNOSIS — S79929A Unspecified injury of unspecified thigh, initial encounter: Secondary | ICD-10-CM | POA: Diagnosis not present

## 2018-12-12 DIAGNOSIS — M48061 Spinal stenosis, lumbar region without neurogenic claudication: Secondary | ICD-10-CM | POA: Diagnosis present

## 2018-12-12 DIAGNOSIS — L89154 Pressure ulcer of sacral region, stage 4: Secondary | ICD-10-CM | POA: Diagnosis present

## 2018-12-12 DIAGNOSIS — S3992XA Unspecified injury of lower back, initial encounter: Secondary | ICD-10-CM | POA: Diagnosis not present

## 2018-12-12 DIAGNOSIS — I6529 Occlusion and stenosis of unspecified carotid artery: Secondary | ICD-10-CM | POA: Diagnosis not present

## 2018-12-12 DIAGNOSIS — M8589 Other specified disorders of bone density and structure, multiple sites: Secondary | ICD-10-CM | POA: Diagnosis not present

## 2018-12-12 DIAGNOSIS — M2550 Pain in unspecified joint: Secondary | ICD-10-CM | POA: Diagnosis not present

## 2018-12-12 DIAGNOSIS — W1830XA Fall on same level, unspecified, initial encounter: Secondary | ICD-10-CM | POA: Diagnosis present

## 2018-12-12 DIAGNOSIS — M19042 Primary osteoarthritis, left hand: Secondary | ICD-10-CM | POA: Diagnosis present

## 2018-12-12 DIAGNOSIS — Z8249 Family history of ischemic heart disease and other diseases of the circulatory system: Secondary | ICD-10-CM

## 2018-12-12 DIAGNOSIS — D649 Anemia, unspecified: Secondary | ICD-10-CM | POA: Diagnosis not present

## 2018-12-12 DIAGNOSIS — R52 Pain, unspecified: Secondary | ICD-10-CM | POA: Diagnosis not present

## 2018-12-12 DIAGNOSIS — Z66 Do not resuscitate: Secondary | ICD-10-CM | POA: Diagnosis not present

## 2018-12-12 DIAGNOSIS — Z833 Family history of diabetes mellitus: Secondary | ICD-10-CM

## 2018-12-12 DIAGNOSIS — M25551 Pain in right hip: Secondary | ICD-10-CM | POA: Diagnosis not present

## 2018-12-12 DIAGNOSIS — Z853 Personal history of malignant neoplasm of breast: Secondary | ICD-10-CM

## 2018-12-12 DIAGNOSIS — Z9012 Acquired absence of left breast and nipple: Secondary | ICD-10-CM

## 2018-12-12 DIAGNOSIS — N139 Obstructive and reflux uropathy, unspecified: Secondary | ICD-10-CM | POA: Diagnosis not present

## 2018-12-12 DIAGNOSIS — L89620 Pressure ulcer of left heel, unstageable: Secondary | ICD-10-CM | POA: Diagnosis not present

## 2018-12-12 DIAGNOSIS — S72142D Displaced intertrochanteric fracture of left femur, subsequent encounter for closed fracture with routine healing: Secondary | ICD-10-CM | POA: Diagnosis not present

## 2018-12-12 DIAGNOSIS — Z888 Allergy status to other drugs, medicaments and biological substances status: Secondary | ICD-10-CM

## 2018-12-12 DIAGNOSIS — M545 Low back pain: Secondary | ICD-10-CM | POA: Diagnosis not present

## 2018-12-12 DIAGNOSIS — Z7401 Bed confinement status: Secondary | ICD-10-CM | POA: Diagnosis not present

## 2018-12-12 DIAGNOSIS — S72141D Displaced intertrochanteric fracture of right femur, subsequent encounter for closed fracture with routine healing: Secondary | ICD-10-CM | POA: Diagnosis not present

## 2018-12-12 LAB — CBC WITH DIFFERENTIAL/PLATELET
Abs Immature Granulocytes: 0.1 10*3/uL — ABNORMAL HIGH (ref 0.00–0.07)
Basophils Absolute: 0.1 10*3/uL (ref 0.0–0.1)
Basophils Relative: 1 %
Eosinophils Absolute: 0.5 10*3/uL (ref 0.0–0.5)
Eosinophils Relative: 3 %
HCT: 28 % — ABNORMAL LOW (ref 36.0–46.0)
Hemoglobin: 8.7 g/dL — ABNORMAL LOW (ref 12.0–15.0)
Immature Granulocytes: 1 %
Lymphocytes Relative: 11 %
Lymphs Abs: 1.7 10*3/uL (ref 0.7–4.0)
MCH: 29.1 pg (ref 26.0–34.0)
MCHC: 31.1 g/dL (ref 30.0–36.0)
MCV: 93.6 fL (ref 80.0–100.0)
Monocytes Absolute: 1.4 10*3/uL — ABNORMAL HIGH (ref 0.1–1.0)
Monocytes Relative: 9 %
Neutro Abs: 12.6 10*3/uL — ABNORMAL HIGH (ref 1.7–7.7)
Neutrophils Relative %: 75 %
Platelets: 549 10*3/uL — ABNORMAL HIGH (ref 150–400)
RBC: 2.99 MIL/uL — ABNORMAL LOW (ref 3.87–5.11)
RDW: 16.8 % — ABNORMAL HIGH (ref 11.5–15.5)
WBC: 16.5 10*3/uL — ABNORMAL HIGH (ref 4.0–10.5)
nRBC: 0 % (ref 0.0–0.2)

## 2018-12-12 LAB — URINALYSIS, COMPLETE (UACMP) WITH MICROSCOPIC
Bilirubin Urine: NEGATIVE
Glucose, UA: NEGATIVE mg/dL
Hgb urine dipstick: NEGATIVE
Ketones, ur: NEGATIVE mg/dL
Nitrite: NEGATIVE
Protein, ur: 30 mg/dL — AB
Specific Gravity, Urine: 1.015 (ref 1.005–1.030)
WBC, UA: >50 WBC/hpf — ABNORMAL HIGH (ref 0–5)
pH: 5 (ref 5.0–8.0)

## 2018-12-12 LAB — SARS CORONAVIRUS 2 BY RT PCR (HOSPITAL ORDER, PERFORMED IN ~~LOC~~ HOSPITAL LAB): SARS Coronavirus 2: NEGATIVE

## 2018-12-12 LAB — BASIC METABOLIC PANEL
Anion gap: 9 (ref 5–15)
BUN: 28 mg/dL — ABNORMAL HIGH (ref 8–23)
CO2: 23 mmol/L (ref 22–32)
Calcium: 8.9 mg/dL (ref 8.9–10.3)
Chloride: 105 mmol/L (ref 98–111)
Creatinine, Ser: 0.91 mg/dL (ref 0.44–1.00)
GFR calc Af Amer: >60 mL/min (ref >60)
GFR calc non Af Amer: 56 mL/min — ABNORMAL LOW (ref >60)
Glucose, Bld: 143 mg/dL — ABNORMAL HIGH (ref 70–99)
Potassium: 4.4 mmol/L (ref 3.5–5.1)
Sodium: 137 mmol/L (ref 135–145)

## 2018-12-12 LAB — SURGICAL PCR SCREEN
MRSA, PCR: NEGATIVE
Staphylococcus aureus: NEGATIVE

## 2018-12-12 MED ORDER — HYDROCODONE-ACETAMINOPHEN 5-325 MG PO TABS: 1.0000 | ORAL_TABLET | ORAL | Status: DC | PRN

## 2018-12-12 MED ORDER — ONDANSETRON HCL 4 MG/2ML IJ SOLN: 4.0000 mg | Freq: Four times a day (QID) | INTRAMUSCULAR | Status: DC | PRN

## 2018-12-12 MED ORDER — POLYETHYLENE GLYCOL 3350 17 G PO PACK
17.0000 g | PACK | Freq: Every day | ORAL | Status: DC | PRN
  Administered 2018-12-14: 17 g via ORAL
  Filled 2018-12-12: qty 1

## 2018-12-12 MED ORDER — CEFAZOLIN SODIUM-DEXTROSE 2-4 GM/100ML-% IV SOLN
2.0000 g | INTRAVENOUS | Status: DC
  Filled 2018-12-12: qty 100

## 2018-12-12 MED ORDER — HALOPERIDOL LACTATE 5 MG/ML IJ SOLN
1.0000 mg | Freq: Four times a day (QID) | INTRAMUSCULAR | Status: DC | PRN
  Administered 2018-12-13: 1 mg via INTRAVENOUS
  Filled 2018-12-12: qty 1

## 2018-12-12 MED ORDER — LOSARTAN POTASSIUM 50 MG PO TABS
100.0000 mg | ORAL_TABLET | Freq: Every day | ORAL | Status: DC
  Administered 2018-12-15: 100 mg via ORAL
  Filled 2018-12-12 (×2): qty 2

## 2018-12-12 MED ORDER — MORPHINE SULFATE (PF) 2 MG/ML IV SOLN
2.0000 mg | Freq: Once | INTRAVENOUS | Status: AC
  Administered 2018-12-12: 2 mg via INTRAVENOUS
  Filled 2018-12-12: qty 1

## 2018-12-12 MED ORDER — ASPIRIN EC 81 MG PO TBEC
81.0000 mg | DELAYED_RELEASE_TABLET | Freq: Every day | ORAL | Status: DC
  Administered 2018-12-15: 81 mg via ORAL
  Filled 2018-12-12: qty 1

## 2018-12-12 MED ORDER — ONDANSETRON HCL 4 MG PO TABS: 4.0000 mg | ORAL_TABLET | Freq: Four times a day (QID) | ORAL | Status: DC | PRN

## 2018-12-12 MED ORDER — CHLORHEXIDINE GLUCONATE CLOTH 2 % EX PADS
6.0000 | MEDICATED_PAD | Freq: Every day | CUTANEOUS | Status: DC
  Administered 2018-12-14 – 2018-12-15 (×2): 6 via TOPICAL

## 2018-12-12 MED ORDER — LEVOTHYROXINE SODIUM 50 MCG PO TABS
50.0000 ug | ORAL_TABLET | Freq: Every day | ORAL | Status: DC
  Administered 2018-12-14 – 2018-12-15 (×2): 50 ug via ORAL
  Filled 2018-12-12 (×2): qty 1

## 2018-12-12 MED ORDER — ONDANSETRON HCL 4 MG/2ML IJ SOLN
4.0000 mg | Freq: Once | INTRAMUSCULAR | Status: AC
  Administered 2018-12-12: 4 mg via INTRAVENOUS
  Filled 2018-12-12: qty 2

## 2018-12-12 MED ORDER — ACETAMINOPHEN 650 MG RE SUPP: 650.0000 mg | Freq: Four times a day (QID) | RECTAL | Status: DC | PRN

## 2018-12-12 MED ORDER — ACETAMINOPHEN 325 MG PO TABS: 650.0000 mg | ORAL_TABLET | Freq: Four times a day (QID) | ORAL | Status: DC | PRN

## 2018-12-12 MED ORDER — SODIUM CHLORIDE 0.9 % IV SOLN
INTRAVENOUS | Status: DC
  Administered 2018-12-12 – 2018-12-13 (×3): via INTRAVENOUS

## 2018-12-12 MED ORDER — GABAPENTIN 100 MG PO CAPS
100.0000 mg | ORAL_CAPSULE | Freq: Two times a day (BID) | ORAL | Status: DC
  Administered 2018-12-13 – 2018-12-15 (×4): 100 mg via ORAL
  Filled 2018-12-12 (×5): qty 1

## 2018-12-12 MED ORDER — MORPHINE SULFATE (PF) 2 MG/ML IV SOLN
2.0000 mg | INTRAVENOUS | Status: DC | PRN
  Administered 2018-12-12 – 2018-12-13 (×3): 2 mg via INTRAVENOUS
  Filled 2018-12-12 (×3): qty 1

## 2018-12-12 NOTE — ED Notes (Signed)
Patient transported to X-ray 

## 2018-12-12 NOTE — ED Triage Notes (Signed)
Pt via EMS from [facility]. Per staff, pt had an unwitnessed fall on Friday night. X-ray was done and shows a femur fracture. Pt also c/o pain in her bottom and R leg.

## 2018-12-12 NOTE — ED Provider Notes (Signed)
[facility] Emergency Department Provider Note ____________________________________________   First MD Initiated Contact with Patient 12/12/18 1039     (approximate)  I have reviewed the triage vital signs and the nursing notes.   HISTORY  Chief Complaint Fall    HPI [name] is a 85 y.o. female with PMH as noted below who presents from her facility with right hip and buttock pain after a fall 2 days ago.  The fall was unwitnessed.  Patient states that she has had persistent pain to the right hip and her buttocks.  She denies associated neck or back pain.  She states she does not clearly remember the fall.  Past Medical History:  Diagnosis Date  . Anxiety 12/2002  . Arthritis    hands  . Cancer ([place])    breast left arm  . Cancer ([place])    uterine  . Diabetes mellitus 11/2004   Type 2  . Dyspnea    WITH EXERTION  . GERD (gastroesophageal reflux disease)   . Hyperlipidemia 11/2004  . Hypertension 12/2002  . Hypothyroidism   . Osteopenia 02/2002   02/2002 Dexa osteopenia// Dexa 02/26/2004 (Dr. Ubaldo Glassing) stable/IMR femur/spine  . Spinal stenosis   . 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 Ronnald Collum endocrinologist  . Vertigo     Patient Active Problem List   Diagnosis Date Noted  . Closed right hip fracture ([place]) 12/12/2018  . Comminuted fracture of left hip ([place]) 10/27/2018  . Osteoarthritis 03/12/2018  . Overactive bladder 03/12/2018  . Carotid artery stenosis 01/08/2018  . Vitamin D deficiency 01/08/2018  . Poor balance 10/16/2017  . Fall at home 10/15/2017  . Buttock pain 10/15/2017  . Contusion, buttock 10/15/2017  . Macular degeneration 03/20/2017  . Thickened nails 03/20/2017  . History of rectal bleeding 05/21/2016  . Eczema 05/16/2014  . Spinal stenosis of lumbar region 02/15/2014  . Right foot pain 09/23/2013  . Right shoulder pain 09/23/2013  . Other screening mammogram 04/12/2013  .  Dizziness 05/21/2012  . Falls frequently 05/21/2012  . Urge incontinence 12/26/2011  . Hip pain, bilateral 09/15/2011  . Special screening for malignant neoplasms, colon 07/10/2010  . Hypothyroidism 04/30/2010  . CONSTIPATION 04/30/2010  . STRESS REACTION, ACUTE, WITH EMOTIONAL DISTURBANCE 10/09/2009  . Prediabetes 07/22/2009  . Malignant neoplasm of female breast ([place]) 12/29/2007  . Spinal stenosis, lumbar 06/22/2006  . PLUMMER'S DISEASE 06/16/2006  . Hyperlipidemia 06/16/2006  . ANXIETY 06/16/2006  . Essential hypertension 06/16/2006  . Osteopenia 06/16/2006    Past Surgical History:  Procedure Laterality Date  . ABDOMINAL HYSTERECTOMY  1990   endometrial adenoca  . BREAST SURGERY  1980   left breast lumpectomy //abscess of breast evac.  Marland Kitchen CATARACT EXTRACTION W/PHACO Left 12/11/2016   Procedure: CATARACT EXTRACTION PHACO AND INTRAOCULAR LENS PLACEMENT (IOC);  Surgeon: Eulogio Bear, MD;  Location: [facility] ORS;  Service: Ophthalmology;  Laterality: Left;  [place] 1:18.4 AP 36.6% CDE 16.13  . CATARACT EXTRACTION W/PHACO Right 01/01/2017   Procedure: CATARACT EXTRACTION PHACO AND INTRAOCULAR LENS PLACEMENT (IOC);  Surgeon: Eulogio Bear, MD;  Location: [facility] ORS;  Service: Ophthalmology;  Laterality: Right;  [place]: 01:45.5 AP% 16.6 CDE 18.21 Fluid pack lot # [id number]  . CHOLECYSTECTOMY  07/10/1987  . EYE SURGERY     Tear Duct surgery, b/l cataract 2019   . INTRAMEDULLARY (IM) NAIL INTERTROCHANTERIC Left 10/28/2018   Procedure: INTRAMEDULLARY (IM) NAIL INTERTROCHANTRIC LEFT LONG;  Surgeon: Hessie Knows, MD;  Location: [facility] ORS;  Service: Orthopedics;  Laterality: Left;  Marland Kitchen MASTECTOMY Left     Prior to Admission medications   Medication Sig Start Date End Date Taking? Authorizing Provider  aspirin 81 MG tablet Take 81 mg by mouth daily. Daily with food    Yes [provider]  Cholecalciferol 1.25 MG (50000 UT) capsule Take 1 capsule (50,000 Units total) by mouth once a  week. 03/12/18  Yes McLean-Scocuzza, Nino Glow, MD  gabapentin (NEURONTIN) 100 MG capsule TAKE ONE (1) CAPSULE BY MOUTH 2 TIMES DAILY 03/12/18  Yes McLean-Scocuzza, Nino Glow, MD  HYDROcodone-acetaminophen (NORCO/VICODIN) 5-325 MG tablet Take 1-2 tablets by mouth every 4 (four) hours as needed for moderate pain. 11/01/18  Yes Duanne Guess, PA-C  levothyroxine (SYNTHROID, LEVOTHROID) 50 MCG tablet Take 1 tablet (50 mcg total) by mouth daily. 03/12/18  Yes McLean-Scocuzza, Nino Glow, MD  losartan (COZAAR) 100 MG tablet Take 1 tablet (100 mg total) by mouth daily. 11/01/18  Yes Gladstone Lighter, MD  mometasone (ELOCON) 0.1 % cream Apply 1 application topically daily. To affected areas behind ears 03/26/17  Yes Tower, Wynelle Fanny, MD  vitamin C (ASCORBIC ACID) 500 MG tablet Take 500 mg by mouth daily.   Yes [provider]  enoxaparin (LOVENOX) 40 MG/0.4ML injection Inject 0.4 mLs (40 mg total) into the skin daily for 14 days. 11/01/18 11/15/18  Duanne Guess, PA-C  polyethylene glycol powder (GLYCOLAX/MIRALAX) powder Take 17 g by mouth daily as needed. As directed for constipation 03/26/17   Tower, Wynelle Fanny, MD    Allergies Amlodipine, Atenolol, Erythromycin base, and Lisinopril  Family History  Problem Relation Age of Onset  . Alzheimer's disease Mother   . Heart disease Mother 70       angina  . Hypertension Mother   . Crohn's disease Mother        ? crohns disease  . Cancer Father 47       colon cancer  . Cancer Sister 23       breast cancer   . COPD Sister        emphysema (smoker)  . Heart disease Sister        angina  . Alcohol abuse Brother   . Diabetes Paternal Aunt   . Hypertension Sister   . Stroke Neg Hx     Social History Social History   Tobacco Use  . Smoking status: Never Smoker  . Smokeless tobacco: Never Used  Substance Use Topics  . Alcohol use: No    Alcohol/week: 0.0 standard drinks  . Drug use: No    Review of Systems  Constitutional: No fever. Eyes: No  redness. ENT: No sore throat. Cardiovascular: Denies chest pain. Respiratory: Denies shortness of breath. Gastrointestinal: No vomiting. Genitourinary: Negative for flank pain.  Musculoskeletal: Positive for right hip pain. Skin: Negative for rash. Neurological: Negative for headache.   ____________________________________________   PHYSICAL EXAM:  VITAL SIGNS: ED Triage Vitals  Enc Vitals Group     BP 12/12/18 1040 (!) 133/57     Pulse Rate 12/12/18 1035 77     Resp 12/12/18 1035 20     Temp 12/12/18 1035 99.5 F (37.5 C)     Temp Source 12/12/18 1035 Oral     SpO2 12/12/18 1035 94 %     Weight 12/12/18 1030 135 lb (61.2 kg)     Height 12/12/18 1030 5\' 3"  (1.6 m)     Head Circumference --  Peak Flow --      Pain Score 12/12/18 1029 8     Pain Loc --      Pain Edu? --      Excl. in [place]? --     Constitutional: Alert and oriented.  Uncomfortable appearing but in no acute distress. Eyes: Conjunctivae are normal.  Head: Atraumatic. Nose: No congestion/rhinnorhea. Mouth/Throat: Mucous membranes are slightly dry.   Neck: Normal range of motion.  Cardiovascular: Normal rate, regular rhythm.Good peripheral circulation. Respiratory: Normal respiratory effort.  No retractions.  Gastrointestinal: Soft and nontender. No distention.  Genitourinary: No flank tenderness. Musculoskeletal: No lower extremity edema.  Extremities warm and well perfused.  Pain on range of motion of right hip.  No midline spinal tenderness. Neurologic:  Normal speech and language.  Motor intact in all extremities. Skin:  Skin is warm and dry. No rash noted. Psychiatric: Speech and behavior are normal.  ____________________________________________   LABS (all labs ordered are listed, but only abnormal results are displayed)  Labs Reviewed  CBC WITH DIFFERENTIAL/PLATELET - Abnormal; Notable for the following components:      Result Value   WBC 16.5 (*)    RBC 2.99 (*)    Hemoglobin 8.7 (*)     HCT 28.0 (*)    RDW 16.8 (*)    Platelets 549 (*)    Neutro Abs 12.6 (*)    Monocytes Absolute 1.4 (*)    Abs Immature Granulocytes 0.10 (*)    All other components within normal limits  URINALYSIS, COMPLETE (UACMP) WITH MICROSCOPIC - Abnormal; Notable for the following components:   Color, Urine YELLOW (*)    APPearance HAZY (*)    Protein, ur 30 (*)    Leukocytes,Ua LARGE (*)    WBC, UA >50 (*)    Bacteria, UA RARE (*)    All other components within normal limits  BASIC METABOLIC PANEL - Abnormal; Notable for the following components:   Glucose, Bld 143 (*)    BUN 28 (*)    GFR calc non Af Amer 56 (*)    All other components within normal limits  SARS CORONAVIRUS 2 (HOSPITAL ORDER, [place] [facility])  OCCULT BLOOD X 1 CARD TO LAB, STOOL   ____________________________________________  EKG  ED ECG REPORT I, Arta Silence, the attending physician, personally viewed and interpreted this ECG.  Date: 12/12/2018 EKG Time: 1029 Rate: 89 Rhythm: normal sinus rhythm QRS Axis: normal Intervals: normal ST/T Wave abnormalities: normal Narrative Interpretation: no evidence of acute ischemia  ____________________________________________  RADIOLOGY  CT head: No ICH CT cervical spine: No acute fracture CT lumbar spine: No acute fracture XR R hip: Intertrochanteric fracture  ____________________________________________   PROCEDURES  Procedure(s) performed: No  Procedures  Critical Care performed: No ____________________________________________   INITIAL IMPRESSION / ASSESSMENT AND PLAN / ED COURSE  Pertinent labs & imaging results that were available during my care of the patient were reviewed by me and considered in my medical decision making (see chart for details).  85 year old female presents after a fall 2 days ago at her facility.  The fall was unwitnessed and the patient does not clearly remember it, but from the history it appears  to be most likely mechanical.  The patient was apparently diagnosed with a femur fracture on the right, however she also reports pain in the buttocks.  It is unclear whether she hit her head.  On exam the patient is uncomfortable appearing.  Her vital signs are normal.  The exam is as described above, with pain on range of motion of the right hip.  She has no midline tenderness but does have some sacral/buttock area tenderness.  She has no visible trauma to the head.  Given the patient's age and the possibility of head trauma since the fall was unwitnessed and the patient does not clearly remember, we will obtain CT head and cervical spine.  I will also obtain lumbosacral CT as well as x-ray of the right hip to further evaluate.  I anticipate the need for likely Ortho consultation and admission.  ----------------------------------------- 4:00 PM on 12/12/2018 -----------------------------------------  X-ray confirms right intertrochanteric femur fracture.  CT head, C-spine, and L-spine are all negative.  Lab work-up is unremarkable except for elevated WBC count and findings consistent with possible UTI.  I consulted Dr. Roland Rack from orthopedics who evaluated the patient in the ED.  I discussed the case with Dr. Brett Albino from the hospitalist service for admission.  _________________________________  [name] was evaluated in Emergency Department on 12/12/2018 for the symptoms described in the history of present illness. She was evaluated in the context of the global COVID-19 pandemic, which necessitated consideration that the patient might be at risk for infection with the SARS-CoV-2 virus that causes COVID-19. Institutional protocols and algorithms that pertain to the evaluation of patients at risk for COVID-19 are in a state of rapid change based on information released by regulatory bodies including the CDC and federal and state organizations. These policies and algorithms were followed during the  patient's care in the ED.  ____________________________________________   FINAL CLINICAL IMPRESSION(S) / ED DIAGNOSES  Final diagnoses:  Closed nondisplaced intertrochanteric fracture of right femur, initial encounter ([place])      NEW MEDICATIONS STARTED DURING THIS VISIT:  Current Discharge Medication List       Note:  This document was prepared using Dragon voice recognition software and may include unintentional dictation errors.    Arta Silence, MD 12/12/18 1601

## 2018-12-12 NOTE — ED Notes (Signed)
Pt seems to still be in pain. Pt is crying out and moaning. Dr. Cherylann Banas, MD made aware.

## 2018-12-12 NOTE — Progress Notes (Signed)
Family Meeting Note  Advance Directive:no  Today a meeting took place with the Patient.  Patient is able to participate somewhat, but is mildly confused.  The following clinical team members were present during this meeting:MD  The following were discussed:Patient's diagnosis: right hip fracture, Patient's progosis: Unable to determine and Goals for treatment: Full Code  Additional follow-up to be provided: prn- will continue to discuss with patient  Time spent during discussion:20 minutes  Evette Doffing, MD

## 2018-12-12 NOTE — Consult Note (Signed)
ORTHOPAEDIC CONSULTATION  REQUESTING PHYSICIAN: Mayo, Pete Pelt, MD  Chief Complaint:   Right hip pain.  History of Present Illness: [name] is a 85 y.o. female with a history of diabetes, hypertension, hyperlipidemia, hypothyroidism, thyroid disease and anxiety who lives at an assisted living facility.  Apparently lost her balance and fell onto her right hip while trying to lock her door after her son left her home on Friday evening.  She was not found until this morning.  She was brought to the emergency room where x-rays demonstrated a mildly displaced intertrochanteric right hip fracture.  The patient has been admitted to the hospitalist service for medical stabilization prior to definitive management of this injury.  The patient denies any associated injuries resulting from the fall.  She did not strike her head or lose consciousness.  She also denies any lightheadedness, dizziness, chest pain, shortness of breath, or other symptoms which may have precipitated her fall.  Past Medical History:  Diagnosis Date  . Anxiety 12/2002  . Arthritis    hands  . Cancer ([place])    breast left arm  . Cancer ([place])    uterine  . Diabetes mellitus 11/2004   Type 2  . Dyspnea    WITH EXERTION  . GERD (gastroesophageal reflux disease)   . Hyperlipidemia 11/2004  . Hypertension 12/2002  . Hypothyroidism   . Osteopenia 02/2002   02/2002 Dexa osteopenia// Dexa 02/26/2004 (Dr. Ubaldo Glassing) stable/IMR femur/spine  . Spinal stenosis   . 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 Ronnald Collum endocrinologist  . Vertigo    Past Surgical History:  Procedure Laterality Date  . ABDOMINAL HYSTERECTOMY  1990   endometrial adenoca  . BREAST SURGERY  1980   left breast lumpectomy //abscess of breast evac.  Marland Kitchen CATARACT EXTRACTION W/PHACO Left 12/11/2016   Procedure: CATARACT EXTRACTION PHACO AND INTRAOCULAR LENS PLACEMENT  (IOC);  Surgeon: Eulogio Bear, MD;  Location: [facility];  Service: Ophthalmology;  Laterality: Left;  [place] 1:18.4 AP 36.6% CDE 16.13  . CATARACT EXTRACTION W/PHACO Right 01/01/2017   Procedure: CATARACT EXTRACTION PHACO AND INTRAOCULAR LENS PLACEMENT (IOC);  Surgeon: Eulogio Bear, MD;  Location: [facility];  Service: Ophthalmology;  Laterality: Right;  [place]: 01:45.5 AP% 16.6 CDE 18.21 Fluid pack lot # [id number]  . CHOLECYSTECTOMY  07/10/1987  . EYE SURGERY     Tear Duct surgery, b/l cataract 2019   . INTRAMEDULLARY (IM) NAIL INTERTROCHANTERIC Left 10/28/2018   Procedure: INTRAMEDULLARY (IM) NAIL INTERTROCHANTRIC LEFT LONG;  Surgeon: Hessie Knows, MD;  Location: [facility];  Service: Orthopedics;  Laterality: Left;  Marland Kitchen MASTECTOMY Left    Social History   Socioeconomic History  . Marital status: Married    Spouse name: Not on file  . Number of children: Not on file  . Years of education: Not on file  . Highest education level: Not on file  Occupational History  . Not on file  Social Needs  . Financial resource strain: Not on file  . Food insecurity    Worry: Not on file    Inability: Not on file  . Transportation needs    Medical: Not on file    Non-medical: Not on file  Tobacco Use  . Smoking status: Never Smoker  . Smokeless tobacco: Never Used  Substance and Sexual Activity  . Alcohol use: No    Alcohol/week: 0.0 standard drinks  . Drug use: No  . Sexual activity: Not on file  Lifestyle  .  Physical activity    Days per week: Not on file    Minutes per session: Not on file  . Stress: Not on file  Relationships  . Social Herbalist on phone: Not on file    Gets together: Not on file    Attends religious service: Not on file    Active member of club or organization: Not on file    Attends meetings of clubs or organizations: Not on file    Relationship status: Not on file  Other Topics Concern  . Not on file  Social History Narrative   Takes care of  husband full time who has alzheimers -- very difficult with little help   Relies on family I.e granddaughter and grandaughters other grandmother to bring to appts       Has dog [place]    1 son living 1 son died 2009-06-12 suicide    Family History  Problem Relation Age of Onset  . Alzheimer's disease Mother   . Heart disease Mother 40       angina  . Hypertension Mother   . Crohn's disease Mother        ? crohns disease  . Cancer Father 49       colon cancer  . Cancer Sister 81       breast cancer   . COPD Sister        emphysema (smoker)  . Heart disease Sister        angina  . Alcohol abuse Brother   . Diabetes Paternal Aunt   . Hypertension Sister   . Stroke Neg Hx    Allergies  Allergen Reactions  . Amlodipine Other (See Comments)    Leg edema   . Atenolol Other (See Comments)    REACTION: bradycardia  . Erythromycin Base Other (See Comments)    REACTION: stomach upset  . Lisinopril Other (See Comments)    REACTION: cough  (20 mg)   Prior to Admission medications   Medication Sig Start Date End Date Taking? Authorizing Provider  aspirin 81 MG tablet Take 81 mg by mouth daily. Daily with food    Yes [provider]  Cholecalciferol 1.25 MG (50000 UT) capsule Take 1 capsule (50,000 Units total) by mouth once a week. 03/12/18  Yes McLean-Scocuzza, Nino Glow, MD  gabapentin (NEURONTIN) 100 MG capsule TAKE ONE (1) CAPSULE BY MOUTH 2 TIMES DAILY 03/12/18  Yes McLean-Scocuzza, Nino Glow, MD  HYDROcodone-acetaminophen (NORCO/VICODIN) 5-325 MG tablet Take 1-2 tablets by mouth every 4 (four) hours as needed for moderate pain. 11/01/18  Yes Duanne Guess, PA-C  levothyroxine (SYNTHROID, LEVOTHROID) 50 MCG tablet Take 1 tablet (50 mcg total) by mouth daily. 03/12/18  Yes McLean-Scocuzza, Nino Glow, MD  losartan (COZAAR) 100 MG tablet Take 1 tablet (100 mg total) by mouth daily. 11/01/18  Yes Gladstone Lighter, MD  mometasone (ELOCON) 0.1 % cream Apply 1 application topically daily.  To affected areas behind ears 03/26/17  Yes Tower, Wynelle Fanny, MD  vitamin C (ASCORBIC ACID) 500 MG tablet Take 500 mg by mouth daily.   Yes [provider]  enoxaparin (LOVENOX) 40 MG/0.4ML injection Inject 0.4 mLs (40 mg total) into the skin daily for 14 days. 11/01/18 11/15/18  Duanne Guess, PA-C  polyethylene glycol powder (GLYCOLAX/MIRALAX) powder Take 17 g by mouth daily as needed. As directed for constipation 03/26/17   Tower, Wynelle Fanny, MD   Dg Chest 1 View  Result Date: 12/12/2018 CLINICAL  DATA:  Right hip fracture EXAM: CHEST  1 VIEW COMPARISON:  10/27/2018 chest radiograph. FINDINGS: Stable cardiomediastinal silhouette with normal heart size. No pneumothorax. No pleural effusion. Lungs appear clear, with no acute consolidative airspace disease and no pulmonary edema. IMPRESSION: No active disease. Electronically Signed   By: Ilona Sorrel M.D.   On: 12/12/2018 11:55   Ct Head Wo Contrast  Result Date: 12/12/2018 CLINICAL DATA:  85 year old female status post unwitnessed fall the night before last. Low back pain. Femur fracture. EXAM: CT HEAD WITHOUT CONTRAST TECHNIQUE: Contiguous axial images were obtained from the base of the skull through the vertex without intravenous contrast. COMPARISON:  Cervical spine CT today reported separately. Head CT 10/27/2018. Brain MRI 08/02/2009. FINDINGS: Brain: Stable cerebral volume, normal for age. No midline shift, ventriculomegaly, mass effect, evidence of mass lesion, intracranial hemorrhage or evidence of cortically based acute infarction. Patchy and confluent bilateral cerebral white matter hypodensity remain stable. Small chronic infarct in the posterior right cerebellum is stable. Vascular: Calcified atherosclerosis at the skull base. No suspicious intracranial vascular hyperdensity. Skull: No acute osseous abnormality identified. Sinuses/Orbits: Visualized paranasal sinuses and mastoids are clear. Other: No acute orbit or scalp soft tissue finding.  IMPRESSION: Stable non contrast CT appearance of the brain. No acute intracranial abnormality or acute traumatic injury identified. Electronically Signed   By: Genevie Ann M.D.   On: 12/12/2018 11:55   Ct Cervical Spine Wo Contrast  Result Date: 12/12/2018 CLINICAL DATA:  85 year old female status post unwitnessed fall the night before last. Low back pain. Femur fracture. EXAM: CT CERVICAL SPINE WITHOUT CONTRAST TECHNIQUE: Multidetector CT imaging of the cervical spine was performed without intravenous contrast. Multiplanar CT image reconstructions were also generated. COMPARISON:  cervical spine CT 02/04/2014. FINDINGS: Alignment: Cervicothoracic junction alignment is within normal limits. Stable cervical vertebral height and alignment since 2015 including mild degenerative appearing anterolisthesis at both C4-C5 and C5-C6. Bilateral posterior element alignment is within normal limits. Skull base and vertebrae: Visualized skull base is intact. No atlanto-occipital dissociation. No acute osseous abnormality identified. Soft tissues and spinal canal: No prevertebral fluid or swelling. No visible canal hematoma. Calcified atherosclerosis in the neck and at the skull base. Mild thyroid goiter. Disc levels: Widespread advanced cervical facet arthropathy. Superimposed lower cervical disc and endplate degeneration which has not significantly changed since 2015. Upper chest: Visible upper thoracic levels appear grossly intact. Negative lung apices. Other: Visualized paranasal sinuses and mastoids are clear. IMPRESSION: 1.  No acute traumatic injury identified in the cervical spine. 2. Cervical spine degeneration not significantly changed since 2015. Electronically Signed   By: Genevie Ann M.D.   On: 12/12/2018 11:51   Ct Lumbar Spine Wo Contrast  Result Date: 12/12/2018 CLINICAL DATA:  85 year old female status post unwitnessed fall the night before last. Low back pain. Femur fracture. EXAM: CT LUMBAR SPINE WITHOUT  CONTRAST TECHNIQUE: Multidetector CT imaging of the lumbar spine was performed without intravenous contrast administration. Multiplanar CT image reconstructions were also generated. COMPARISON:  Lumbar MRI 10/21/2011. FINDINGS: Segmentation: Normal, hypoplastic ribs at T12. Alignment: Chronic grade 1 anterolisthesis at L4-L5 appears stable since 2013. Dextroconvex lumbar scoliosis is moderate. Vertebrae: Osteopenia. The visible lower thoracic levels and lower ribs appear intact (chronic appearing posterior left 12th rib fracture). A moderate chronic L2 compression fracture is stable since 2013. There is a chronic fracture of the left L3 transverse process on series 3, image 60. The remaining lumbar levels appear intact. There were bilateral sacral insufficiency fractures on the 2013  comparison. Today the visible sacrum and SI joints appear intact. Paraspinal and other soft tissues: Vascular patency is not evaluated in the absence of IV contrast. Aortoiliac calcified atherosclerosis. Small gastric hiatal hernia. Surgically absent gallbladder. Negative other visible noncontrast abdominal viscera. Paraspinal soft tissues are within normal limits. Disc levels: Chronic lumbar spine degeneration, most notable for L3-L4 moderate multifactorial spinal stenosis related to bulky disc bulge and posterior element hypertrophy. Vacuum disc. Chronic left foraminal stenosis. This level is probably stable since 2013. L4-L5: Chronic anterolisthesis with disc and severe posterior element degeneration. Very severe chronic spinal and lateral recess stenosis. Moderate to severe chronic L4 foraminal stenosis. L5-S1: Severe chronic facet degeneration. IMPRESSION: 1. No acute osseous abnormality identified in the lumbar spine. Osteopenia. Chronic L2 compression fracture and chronic fracture of the left L3 transverse processes. 2. Very severe chronic lower lumbar spine degeneration and spinal stenosis, maximal at L4-L5. 3. Aortic  Atherosclerosis (ICD10-I70.0). Electronically Signed   By: Genevie Ann M.D.   On: 12/12/2018 11:47   Dg Hip Unilat W Or Wo Pelvis 2-3 Views Right  Result Date: 12/12/2018 CLINICAL DATA:  Right hip pain after fall EXAM: DG HIP (WITH OR WITHOUT PELVIS) 2-3V RIGHT COMPARISON:  09/15/2011 pelvic radiographs FINDINGS: Suspected acute intertrochanteric proximal right femur fracture without significant displacement. No additional fracture. No pelvic diastasis. No hip dislocation. Fixation hardware partially visualized in the proximal left femur. Degenerative changes in the visualized lower lumbar spine. Mild osteoarthritis in the weight-bearing portions of both hip joints. No suspicious focal osseous lesions. IMPRESSION: Suspected acute intertrochanteric proximal right femur fracture without significant displacement. Right hip CT may be considered for further evaluation. Electronically Signed   By: Ilona Sorrel M.D.   On: 12/12/2018 11:54    Positive ROS: All other systems have been reviewed and were otherwise negative with the exception of those mentioned in the HPI and as above.  Physical Exam: General:  Alert, no acute distress Psychiatric:  Patient is competent for consent with normal mood and affect   Cardiovascular:  No pedal edema Respiratory:  No wheezing, non-labored breathing GI:  Abdomen is soft and non-tender Skin:  No lesions in the area of chief complaint Neurologic:  Sensation intact distally Lymphatic:  No axillary or cervical lymphadenopathy  Orthopedic Exam:  Orthopedic examination is limited to the right hip and lower extremity.  The right lower extremity is somewhat shortened and externally rotated as compared to the left.  Skin inspection around the right hip is unremarkable.  No swelling, erythema, ecchymosis, abrasions, or other skin abnormalities are identified.  She has mild tenderness to palpation of the lateral aspect of the hip.  She has more severe pain with any attempted active  or passive motion of the hip.  She is able dorsiflex and plantarflex her toes and ankle.  Sensations intact light touch to all distributions.  She has good capillary refill to her right foot.  X-rays:  X-rays of the pelvis and right hip are available for review and have been reviewed by myself.  These films demonstrate a comminuted displaced intertrochanteric fracture of the right hip.  No significant degenerative changes of the hip joint are noted.  No lytic lesions or other acute bony abnormalities are identified.  Assessment: Displaced intertrochanteric fracture right hip.  Plan: The treatment options have been discussed with the patient, including both surgical nonsurgical choices.  The patient would like to proceed with surgical intervention to include an intramedullary nailing of the right hip fracture.  This procedure  has been discussed in detail with the patient, as have the potential risks (including bleeding, infection, nerve and/or blood vessel injury, persistent or recurrent pain, stiffness, malunion or nonunion, need for further surgery, blood clots, strokes, heart attacks and arrhythmias, etc.) and benefits but the patient states her understanding wishes to proceed.  A formal written consent will be obtained by the nursing staff.  Thank you for asking me to participate in the care of this most pleasant yet unfortunate woman.  I will be happy to follow her with you.   Pascal Lux, MD  Beeper #:  [phone]  12/12/2018 3:00 PM

## 2018-12-12 NOTE — H&P (Addendum)
[facility] NAME: [name]    MR#:  [id number]  DATE OF BIRTH:  February 08, 1927  DATE OF ADMISSION:  12/12/2018  PRIMARY CARE PHYSICIAN: McLean-Scocuzza, Nino Glow, MD   REQUESTING/REFERRING PHYSICIAN: Arta Silence, MD  CHIEF COMPLAINT:   Chief Complaint  Patient presents with   Fall    HISTORY OF PRESENT ILLNESS:  [name]  is a 85 y.o. female with a known history of hypertension, hyperlipidemia, hypothyroidism, osteopenia, left breast cancer, uterine cancer, spinal stenosis, anxiety who presented to the ED from [facility] with right hip pain after an unwitnessed fall.  Patient states she is having a lot of pain on her right lateral hip.  She denies any numbness or tingling in her right leg.  She states she does not really remember the fall.  Per ED physician, the fall occurred on Friday.  Patient is mildly confused in the ED.  In the ED, she was mildly hypertensive.  Labs were significant for WBC 16.5, hemoglobin 8.7.  Chest x-ray was unremarkable.  Right hip x-ray with suspected acute intertrochanteric proximal right femur fracture without significant displacement.  CT head and CT C-spine were unremarkable.  L-spine with chronic L2 compression fracture and chronic L3 left transverse process fracture, as well as very severe chronic lower lumbar spine degeneration and spinal stenosis.  Hospitalists were called for admission.  PAST MEDICAL HISTORY:   Past Medical History:  Diagnosis Date   Anxiety 12/2002   Arthritis    hands   Cancer ([place])    breast left arm   Cancer ([place])    uterine   Diabetes mellitus 11/2004   Type 2   Dyspnea    WITH EXERTION   GERD (gastroesophageal reflux disease)    Hyperlipidemia 11/2004   Hypertension 12/2002   Hypothyroidism    Osteopenia 02/2002   02/2002 Dexa osteopenia// Dexa 02/26/2004 (Dr. 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 Ronnald Collum endocrinologist   Vertigo     PAST SURGICAL HISTORY:   Past Surgical History:  Procedure Laterality Date   ABDOMINAL HYSTERECTOMY  1990   endometrial adenoca   BREAST SURGERY  1980   left breast lumpectomy //abscess of breast evac.   CATARACT EXTRACTION W/PHACO Left 12/11/2016   Procedure: CATARACT EXTRACTION PHACO AND INTRAOCULAR LENS PLACEMENT (IOC);  Surgeon: Eulogio Bear, MD;  Location: [facility] ORS;  Service: Ophthalmology;  Laterality: Left;  [place] 1:18.4 AP 36.6% CDE 16.13   CATARACT EXTRACTION W/PHACO Right 01/01/2017   Procedure: CATARACT EXTRACTION PHACO AND INTRAOCULAR LENS PLACEMENT (IOC);  Surgeon: Eulogio Bear, MD;  Location: [facility] ORS;  Service: Ophthalmology;  Laterality: Right;  [place]: 01:45.5 AP% 16.6 CDE 18.21 Fluid pack lot # [id number]   CHOLECYSTECTOMY  07/10/1987   EYE SURGERY     Tear Duct surgery, b/l cataract 2019    INTRAMEDULLARY (IM) NAIL INTERTROCHANTERIC Left 10/28/2018   Procedure: INTRAMEDULLARY (IM) NAIL INTERTROCHANTRIC LEFT LONG;  Surgeon: Hessie Knows, MD;  Location: [facility] ORS;  Service: Orthopedics;  Laterality: Left;   MASTECTOMY Left     SOCIAL HISTORY:   Social History   Tobacco Use   Smoking status: Never Smoker   Smokeless tobacco: Never Used  Substance Use Topics   Alcohol use: No    Alcohol/week: 0.0 standard drinks    FAMILY HISTORY:   Family History  Problem Relation Age of Onset  Alzheimer's disease Mother    Heart disease Mother 48       angina   Hypertension Mother    Crohn's disease Mother        ? crohns disease   Cancer Father 26       colon cancer   Cancer Sister 42       breast cancer    COPD Sister        emphysema (smoker)   Heart disease Sister        angina   Alcohol abuse Brother    Diabetes Paternal Aunt    Hypertension Sister    Stroke Neg Hx     DRUG ALLERGIES:   Allergies  Allergen Reactions   Amlodipine  Other (See Comments)    Leg edema    Atenolol Other (See Comments)    REACTION: bradycardia   Erythromycin Base Other (See Comments)    REACTION: stomach upset   Lisinopril Other (See Comments)    REACTION: cough  (20 mg)    REVIEW OF SYSTEMS:   Review of Systems  Constitutional: Negative for chills and fever.  HENT: Negative for congestion and sore throat.   Eyes: Negative for blurred vision and double vision.  Respiratory: Negative for cough and shortness of breath.   Cardiovascular: Negative for chest pain and palpitations.  Gastrointestinal: Negative for nausea and vomiting.  Genitourinary: Negative for dysuria and urgency.  Musculoskeletal: Positive for back pain, falls and joint pain.  Neurological: Negative for dizziness, tingling, sensory change and headaches.  Psychiatric/Behavioral: Negative for depression. The patient is not nervous/anxious.     MEDICATIONS AT HOME:   Prior to Admission medications   Medication Sig Start Date End Date Taking? Authorizing Provider  aspirin 81 MG tablet Take 81 mg by mouth daily. Daily with food    Yes [provider]  Cholecalciferol 1.25 MG (50000 UT) capsule Take 1 capsule (50,000 Units total) by mouth once a week. 03/12/18  Yes McLean-Scocuzza, Nino Glow, MD  gabapentin (NEURONTIN) 100 MG capsule TAKE ONE (1) CAPSULE BY MOUTH 2 TIMES DAILY 03/12/18  Yes McLean-Scocuzza, Nino Glow, MD  HYDROcodone-acetaminophen (NORCO/VICODIN) 5-325 MG tablet Take 1-2 tablets by mouth every 4 (four) hours as needed for moderate pain. 11/01/18  Yes Duanne Guess, PA-C  levothyroxine (SYNTHROID, LEVOTHROID) 50 MCG tablet Take 1 tablet (50 mcg total) by mouth daily. 03/12/18  Yes McLean-Scocuzza, Nino Glow, MD  losartan (COZAAR) 100 MG tablet Take 1 tablet (100 mg total) by mouth daily. 11/01/18  Yes Gladstone Lighter, MD  mometasone (ELOCON) 0.1 % cream Apply 1 application topically daily. To affected areas behind ears 03/26/17  Yes Tower, Wynelle Fanny, MD    vitamin C (ASCORBIC ACID) 500 MG tablet Take 500 mg by mouth daily.   Yes [provider]  enoxaparin (LOVENOX) 40 MG/0.4ML injection Inject 0.4 mLs (40 mg total) into the skin daily for 14 days. 11/01/18 11/15/18  Duanne Guess, PA-C  polyethylene glycol powder (GLYCOLAX/MIRALAX) powder Take 17 g by mouth daily as needed. As directed for constipation 03/26/17   Tower, Wynelle Fanny, MD      VITAL SIGNS:  Blood pressure 139/63, pulse (!) 46, temperature 99.5 F (37.5 C), temperature source Oral, resp. rate (!) 21, height 5\' 3"  (1.6 m), weight 61.2 kg, SpO2 95 %.  PHYSICAL EXAMINATION:  Physical Exam  GENERAL:  85 y.o.-year-old patient lying in the bed with no acute distress.  Thin appearing. EYES: Pupils equal, round, reactive to light and  accommodation. No scleral icterus. Extraocular muscles intact.  HEENT: Head atraumatic, normocephalic. Oropharynx and nasopharynx clear.  NECK:  Supple, no jugular venous distention. No thyroid enlargement, no tenderness.  LUNGS: Normal breath sounds bilaterally, no wheezing, rales,rhonchi or crepitation. No use of accessory muscles of respiration.  CARDIOVASCULAR: RRR, S1, S2 normal. No murmurs, rubs, or gallops.  ABDOMEN: Soft, nontender, nondistended. Bowel sounds present. No organomegaly or mass.  EXTREMITIES: No pedal edema, cyanosis, or clubbing. + Tenderness to palpation of the right hip. NEUROLOGIC: Cranial nerves II through XII are intact. Muscle strength 5/5 in all extremities. Sensation intact. Gait not checked.  PSYCHIATRIC: The patient is alert and oriented only to self. SKIN: No obvious rash, lesion, or ulcer.   LABORATORY PANEL:   CBC Recent Labs  Lab 12/12/18 1038  WBC 16.5*  HGB 8.7*  HCT 28.0*  PLT 549*   ------------------------------------------------------------------------------------------------------------------  Chemistries  Recent Labs  Lab 12/12/18 1144  NA 137  K 4.4  CL 105  CO2 23  GLUCOSE 143*  BUN  28*  CREATININE 0.91  CALCIUM 8.9   ------------------------------------------------------------------------------------------------------------------  Cardiac Enzymes No results for input(s): TROPONINI in the last 168 hours. ------------------------------------------------------------------------------------------------------------------  RADIOLOGY:  Dg Chest 1 View  Result Date: 12/12/2018 CLINICAL DATA:  Right hip fracture EXAM: CHEST  1 VIEW COMPARISON:  10/27/2018 chest radiograph. FINDINGS: Stable cardiomediastinal silhouette with normal heart size. No pneumothorax. No pleural effusion. Lungs appear clear, with no acute consolidative airspace disease and no pulmonary edema. IMPRESSION: No active disease. Electronically Signed   By: Ilona Sorrel M.D.   On: 12/12/2018 11:55   Ct Head Wo Contrast  Result Date: 12/12/2018 CLINICAL DATA:  85 year old female status post unwitnessed fall the night before last. Low back pain. Femur fracture. EXAM: CT HEAD WITHOUT CONTRAST TECHNIQUE: Contiguous axial images were obtained from the base of the skull through the vertex without intravenous contrast. COMPARISON:  Cervical spine CT today reported separately. Head CT 10/27/2018. Brain MRI 08/02/2009. FINDINGS: Brain: Stable cerebral volume, normal for age. No midline shift, ventriculomegaly, mass effect, evidence of mass lesion, intracranial hemorrhage or evidence of cortically based acute infarction. Patchy and confluent bilateral cerebral white matter hypodensity remain stable. Small chronic infarct in the posterior right cerebellum is stable. Vascular: Calcified atherosclerosis at the skull base. No suspicious intracranial vascular hyperdensity. Skull: No acute osseous abnormality identified. Sinuses/Orbits: Visualized paranasal sinuses and mastoids are clear. Other: No acute orbit or scalp soft tissue finding. IMPRESSION: Stable non contrast CT appearance of the brain. No acute intracranial abnormality or  acute traumatic injury identified. Electronically Signed   By: Genevie Ann M.D.   On: 12/12/2018 11:55   Ct Cervical Spine Wo Contrast  Result Date: 12/12/2018 CLINICAL DATA:  85 year old female status post unwitnessed fall the night before last. Low back pain. Femur fracture. EXAM: CT CERVICAL SPINE WITHOUT CONTRAST TECHNIQUE: Multidetector CT imaging of the cervical spine was performed without intravenous contrast. Multiplanar CT image reconstructions were also generated. COMPARISON:  cervical spine CT 02/04/2014. FINDINGS: Alignment: Cervicothoracic junction alignment is within normal limits. Stable cervical vertebral height and alignment since 2015 including mild degenerative appearing anterolisthesis at both C4-C5 and C5-C6. Bilateral posterior element alignment is within normal limits. Skull base and vertebrae: Visualized skull base is intact. No atlanto-occipital dissociation. No acute osseous abnormality identified. Soft tissues and spinal canal: No prevertebral fluid or swelling. No visible canal hematoma. Calcified atherosclerosis in the neck and at the skull base. Mild thyroid goiter. Disc levels: Widespread advanced cervical facet arthropathy.  Superimposed lower cervical disc and endplate degeneration which has not significantly changed since 2015. Upper chest: Visible upper thoracic levels appear grossly intact. Negative lung apices. Other: Visualized paranasal sinuses and mastoids are clear. IMPRESSION: 1.  No acute traumatic injury identified in the cervical spine. 2. Cervical spine degeneration not significantly changed since 2015. Electronically Signed   By: Genevie Ann M.D.   On: 12/12/2018 11:51   Ct Lumbar Spine Wo Contrast  Result Date: 12/12/2018 CLINICAL DATA:  85 year old female status post unwitnessed fall the night before last. Low back pain. Femur fracture. EXAM: CT LUMBAR SPINE WITHOUT CONTRAST TECHNIQUE: Multidetector CT imaging of the lumbar spine was performed without intravenous  contrast administration. Multiplanar CT image reconstructions were also generated. COMPARISON:  Lumbar MRI 10/21/2011. FINDINGS: Segmentation: Normal, hypoplastic ribs at T12. Alignment: Chronic grade 1 anterolisthesis at L4-L5 appears stable since 2013. Dextroconvex lumbar scoliosis is moderate. Vertebrae: Osteopenia. The visible lower thoracic levels and lower ribs appear intact (chronic appearing posterior left 12th rib fracture). A moderate chronic L2 compression fracture is stable since 2013. There is a chronic fracture of the left L3 transverse process on series 3, image 60. The remaining lumbar levels appear intact. There were bilateral sacral insufficiency fractures on the 2013 comparison. Today the visible sacrum and SI joints appear intact. Paraspinal and other soft tissues: Vascular patency is not evaluated in the absence of IV contrast. Aortoiliac calcified atherosclerosis. Small gastric hiatal hernia. Surgically absent gallbladder. Negative other visible noncontrast abdominal viscera. Paraspinal soft tissues are within normal limits. Disc levels: Chronic lumbar spine degeneration, most notable for L3-L4 moderate multifactorial spinal stenosis related to bulky disc bulge and posterior element hypertrophy. Vacuum disc. Chronic left foraminal stenosis. This level is probably stable since 2013. L4-L5: Chronic anterolisthesis with disc and severe posterior element degeneration. Very severe chronic spinal and lateral recess stenosis. Moderate to severe chronic L4 foraminal stenosis. L5-S1: Severe chronic facet degeneration. IMPRESSION: 1. No acute osseous abnormality identified in the lumbar spine. Osteopenia. Chronic L2 compression fracture and chronic fracture of the left L3 transverse processes. 2. Very severe chronic lower lumbar spine degeneration and spinal stenosis, maximal at L4-L5. 3. Aortic Atherosclerosis (ICD10-I70.0). Electronically Signed   By: Genevie Ann M.D.   On: 12/12/2018 11:47   Dg Hip  Unilat W Or Wo Pelvis 2-3 Views Right  Result Date: 12/12/2018 CLINICAL DATA:  Right hip pain after fall EXAM: DG HIP (WITH OR WITHOUT PELVIS) 2-3V RIGHT COMPARISON:  09/15/2011 pelvic radiographs FINDINGS: Suspected acute intertrochanteric proximal right femur fracture without significant displacement. No additional fracture. No pelvic diastasis. No hip dislocation. Fixation hardware partially visualized in the proximal left femur. Degenerative changes in the visualized lower lumbar spine. Mild osteoarthritis in the weight-bearing portions of both hip joints. No suspicious focal osseous lesions. IMPRESSION: Suspected acute intertrochanteric proximal right femur fracture without significant displacement. Right hip CT may be considered for further evaluation. Electronically Signed   By: Ilona Sorrel M.D.   On: 12/12/2018 11:54      IMPRESSION AND PLAN:   Right hip fracture in the setting of unwitnessed fall- patient just recently broke her left hip in August of this year. -CT head and C-spine were unremarkable -Right hip x-ray with acute intertrochanteric proximal right femur fracture without significant displacement -Orthopedic surgery consult -Pain control -Holding anticoagulation anticipation of surgery -Keep NPO until seen by ortho -PT consult after surgery  Acute metabolic encephalopathy- likely secondary to pain with some underlying dementia. -Haldol as needed agitation -Monitor closely  Leukocytosis- likely reactive in the setting of hip fracture.  No signs of infection. -Monitor -Recheck CBC in the morning  Normocytic anemia- hemoglobin low but at baseline. -Check anemia panel  -May need blood transfusion after surgery  Hypertension-normotensive in the ED -Continue home losartan  Prediabetes-most recent A1c was 5.7%. -Will hold off on SSI and CBG checks for now  Hypothyroidism-stable -Continue home Synthroid  All the records are reviewed and case discussed with ED  provider. Management plans discussed with the patient, family and they are in agreement.  CODE STATUS: Full  TOTAL TIME TAKING CARE OF THIS PATIENT: 45 minutes.    Berna Spare Ausha Sieh M.D on 12/12/2018 at 1:50 PM  Between 7am to 6pm - Pager - [phone]  After 6pm go to [email or web address] - Proofreader  [employer] [facility]  Office  [phone]  CC: Primary care physician; McLean-Scocuzza, Nino Glow, MD   Note: This dictation was prepared with Dragon dictation along with smaller phrase technology. Any transcriptional errors that result from this process are unintentional.

## 2018-12-12 NOTE — ED Notes (Signed)
Pt continuously calling out stating her back hurts and she is unable to lay flay on back. Pt repositioned and now laying on the left side with pillows propping pt onto side. Pt reports this position is more comfortable and had decreased pain at this time

## 2018-12-13 ENCOUNTER — Encounter
Admission: EM | Disposition: A | Discharge status: Skilled Nursing Facility | Payer: Self-pay | Source: Skilled Nursing Facility | Attending: Internal Medicine

## 2018-12-13 ENCOUNTER — Other Ambulatory Visit: Payer: Self-pay

## 2018-12-13 ENCOUNTER — Inpatient Hospital Stay: Payer: Medicare Other

## 2018-12-13 ENCOUNTER — Inpatient Hospital Stay: Payer: Medicare Other | Admitting: Registered Nurse

## 2018-12-13 HISTORY — PX: FEMUR IM NAIL: SHX1597

## 2018-12-13 LAB — GLUCOSE, CAPILLARY
Glucose-Capillary: 124 mg/dL — ABNORMAL HIGH (ref 70–99)
Glucose-Capillary: 138 mg/dL — ABNORMAL HIGH (ref 70–99)

## 2018-12-13 LAB — CBC
HCT: 25.4 % — ABNORMAL LOW (ref 36.0–46.0)
Hemoglobin: 7.8 g/dL — ABNORMAL LOW (ref 12.0–15.0)
MCH: 28.9 pg (ref 26.0–34.0)
MCHC: 30.7 g/dL (ref 30.0–36.0)
MCV: 94.1 fL (ref 80.0–100.0)
Platelets: 434 10*3/uL — ABNORMAL HIGH (ref 150–400)
RBC: 2.7 MIL/uL — ABNORMAL LOW (ref 3.87–5.11)
RDW: 16.8 % — ABNORMAL HIGH (ref 11.5–15.5)
WBC: 14.7 10*3/uL — ABNORMAL HIGH (ref 4.0–10.5)
nRBC: 0 % (ref 0.0–0.2)

## 2018-12-13 LAB — ABO/RH: ABO/RH(D): O NEG

## 2018-12-13 LAB — FOLATE: Folate: 11.3 ng/mL (ref >5.9)

## 2018-12-13 LAB — IRON AND TIBC
Iron: 12 ug/dL — ABNORMAL LOW (ref 28–170)
Saturation Ratios: 6 % — ABNORMAL LOW (ref 10.4–31.8)
TIBC: 191 ug/dL — ABNORMAL LOW (ref 250–450)
UIBC: 179 ug/dL

## 2018-12-13 LAB — BASIC METABOLIC PANEL
Anion gap: 9 (ref 5–15)
BUN: 23 mg/dL (ref 8–23)
CO2: 21 mmol/L — ABNORMAL LOW (ref 22–32)
Calcium: 8.4 mg/dL — ABNORMAL LOW (ref 8.9–10.3)
Chloride: 109 mmol/L (ref 98–111)
Creatinine, Ser: 0.86 mg/dL (ref 0.44–1.00)
GFR calc Af Amer: >60 mL/min (ref >60)
GFR calc non Af Amer: 59 mL/min — ABNORMAL LOW (ref >60)
Glucose, Bld: 126 mg/dL — ABNORMAL HIGH (ref 70–99)
Potassium: 4.1 mmol/L (ref 3.5–5.1)
Sodium: 139 mmol/L (ref 135–145)

## 2018-12-13 LAB — HEMOGLOBIN AND HEMATOCRIT, BLOOD
HCT: 26.2 % — ABNORMAL LOW (ref 36.0–46.0)
HCT: 24.6 % — ABNORMAL LOW (ref 36.0–46.0)
Hemoglobin: 8.2 g/dL — ABNORMAL LOW (ref 12.0–15.0)
Hemoglobin: 7.6 g/dL — ABNORMAL LOW (ref 12.0–15.0)

## 2018-12-13 LAB — FERRITIN: Ferritin: 312 ng/mL — ABNORMAL HIGH (ref 11–307)

## 2018-12-13 LAB — VITAMIN B12: Vitamin B-12: 241 pg/mL (ref 180–914)

## 2018-12-13 SURGERY — INSERTION, INTRAMEDULLARY ROD, FEMUR
Anesthesia: Spinal | Site: Hip | Laterality: Right

## 2018-12-13 MED ORDER — BUPIVACAINE HCL (PF) 0.5 % IJ SOLN
INTRAMUSCULAR | Status: AC
  Filled 2018-12-13: qty 10

## 2018-12-13 MED ORDER — MAGNESIUM HYDROXIDE 400 MG/5ML PO SUSP
30.0000 mL | Freq: Every day | ORAL | Status: DC | PRN
  Administered 2018-12-14: 30 mL via ORAL
  Filled 2018-12-13: qty 30

## 2018-12-13 MED ORDER — METOCLOPRAMIDE HCL 5 MG/ML IJ SOLN: 5.0000 mg | Freq: Three times a day (TID) | INTRAMUSCULAR | Status: DC | PRN

## 2018-12-13 MED ORDER — DIPHENHYDRAMINE HCL 12.5 MG/5ML PO ELIX: 12.5000 mg | ORAL_SOLUTION | ORAL | Status: DC | PRN

## 2018-12-13 MED ORDER — FENTANYL CITRATE (PF) 100 MCG/2ML IJ SOLN
INTRAMUSCULAR | Status: AC
  Filled 2018-12-13: qty 2

## 2018-12-13 MED ORDER — ACETAMINOPHEN 500 MG PO TABS
500.0000 mg | ORAL_TABLET | Freq: Four times a day (QID) | ORAL | Status: AC
  Administered 2018-12-13 – 2018-12-14 (×4): 500 mg via ORAL
  Filled 2018-12-13 (×4): qty 1

## 2018-12-13 MED ORDER — PHENYLEPHRINE HCL (PRESSORS) 10 MG/ML IV SOLN
INTRAVENOUS | Status: DC | PRN
  Administered 2018-12-13: 200 ug via INTRAVENOUS
  Administered 2018-12-13: 100 ug via INTRAVENOUS
  Administered 2018-12-13: 200 ug via INTRAVENOUS
  Administered 2018-12-13: 100 ug via INTRAVENOUS

## 2018-12-13 MED ORDER — FLEET ENEMA 7-19 GM/118ML RE ENEM: 1.0000 | ENEMA | Freq: Once | RECTAL | Status: DC | PRN

## 2018-12-13 MED ORDER — SODIUM CHLORIDE 0.9 % IV SOLN
510.0000 mg | Freq: Once | INTRAVENOUS | Status: AC
  Administered 2018-12-13: 510 mg via INTRAVENOUS
  Filled 2018-12-13: qty 17

## 2018-12-13 MED ORDER — PROPOFOL 500 MG/50ML IV EMUL
INTRAVENOUS | Status: DC | PRN
  Administered 2018-12-13: 30 ug/kg/min via INTRAVENOUS

## 2018-12-13 MED ORDER — ONDANSETRON HCL 4 MG/2ML IJ SOLN: 4.0000 mg | Freq: Four times a day (QID) | INTRAMUSCULAR | Status: DC | PRN

## 2018-12-13 MED ORDER — METOCLOPRAMIDE HCL 10 MG PO TABS: 5.0000 mg | ORAL_TABLET | Freq: Three times a day (TID) | ORAL | Status: DC | PRN

## 2018-12-13 MED ORDER — BISACODYL 10 MG RE SUPP
10.0000 mg | Freq: Every day | RECTAL | Status: DC | PRN
  Administered 2018-12-15: 10 mg via RECTAL
  Filled 2018-12-13: qty 1

## 2018-12-13 MED ORDER — TRAMADOL HCL 50 MG PO TABS: 50.0000 mg | ORAL_TABLET | Freq: Four times a day (QID) | ORAL | Status: DC | PRN

## 2018-12-13 MED ORDER — FENTANYL CITRATE (PF) 100 MCG/2ML IJ SOLN: 25.0000 ug | INTRAMUSCULAR | Status: DC | PRN

## 2018-12-13 MED ORDER — PROPOFOL 10 MG/ML IV BOLUS
INTRAVENOUS | Status: DC | PRN
  Administered 2018-12-13: 20 mg via INTRAVENOUS

## 2018-12-13 MED ORDER — ACETAMINOPHEN 325 MG PO TABS
325.0000 mg | ORAL_TABLET | Freq: Four times a day (QID) | ORAL | Status: DC | PRN
  Administered 2018-12-15: 650 mg via ORAL
  Filled 2018-12-13: qty 2

## 2018-12-13 MED ORDER — ONDANSETRON HCL 4 MG/2ML IJ SOLN: 4.0000 mg | Freq: Once | INTRAMUSCULAR | Status: DC | PRN

## 2018-12-13 MED ORDER — BUPIVACAINE-EPINEPHRINE (PF) 0.5% -1:200000 IJ SOLN
INTRAMUSCULAR | Status: DC | PRN
  Administered 2018-12-13: 30 mL

## 2018-12-13 MED ORDER — FENTANYL CITRATE (PF) 100 MCG/2ML IJ SOLN
INTRAMUSCULAR | Status: DC | PRN
  Administered 2018-12-13 (×2): 25 ug via INTRAVENOUS

## 2018-12-13 MED ORDER — HYDROCODONE-ACETAMINOPHEN 5-325 MG PO TABS
1.0000 | ORAL_TABLET | ORAL | Status: DC | PRN
  Administered 2018-12-15: 1 via ORAL
  Filled 2018-12-13 (×2): qty 1

## 2018-12-13 MED ORDER — EPHEDRINE SULFATE 50 MG/ML IJ SOLN
INTRAMUSCULAR | Status: DC | PRN
  Administered 2018-12-13: 5 mg via INTRAVENOUS
  Administered 2018-12-13: 10 mg via INTRAVENOUS

## 2018-12-13 MED ORDER — SODIUM CHLORIDE 0.9 % IV SOLN
INTRAVENOUS | Status: DC | PRN
  Administered 2018-12-13: 30 ug/min via INTRAVENOUS

## 2018-12-13 MED ORDER — DOCUSATE SODIUM 100 MG PO CAPS
100.0000 mg | ORAL_CAPSULE | Freq: Two times a day (BID) | ORAL | Status: DC
  Administered 2018-12-13 – 2018-12-15 (×4): 100 mg via ORAL
  Filled 2018-12-13 (×4): qty 1

## 2018-12-13 MED ORDER — MEPERIDINE HCL 50 MG/ML IJ SOLN: 12.5000 mg | Freq: Once | INTRAMUSCULAR | Status: DC

## 2018-12-13 MED ORDER — ONDANSETRON HCL 4 MG PO TABS: 4.0000 mg | ORAL_TABLET | Freq: Four times a day (QID) | ORAL | Status: DC | PRN

## 2018-12-13 MED ORDER — ENOXAPARIN SODIUM 40 MG/0.4ML ~~LOC~~ SOLN
40.0000 mg | SUBCUTANEOUS | Status: DC
  Administered 2018-12-15: 40 mg via SUBCUTANEOUS
  Filled 2018-12-13: qty 0.4

## 2018-12-13 MED ORDER — BUPIVACAINE HCL (PF) 0.5 % IJ SOLN
INTRAMUSCULAR | Status: DC | PRN
  Administered 2018-12-13: 2.5 mL

## 2018-12-13 MED ORDER — CEFAZOLIN SODIUM-DEXTROSE 2-4 GM/100ML-% IV SOLN
2.0000 g | Freq: Four times a day (QID) | INTRAVENOUS | Status: AC
  Administered 2018-12-13 – 2018-12-14 (×3): 2 g via INTRAVENOUS
  Filled 2018-12-13 (×3): qty 100

## 2018-12-13 SURGICAL SUPPLY — 43 items
APL PRP STRL LF DISP 70% ISPRP (MISCELLANEOUS) ×2
BIT DRILL 4.3MMS DISTAL GRDTED (BIT) IMPLANT
BNDG COHESIVE 4X5 TAN STRL (GAUZE/BANDAGES/DRESSINGS) ×3 IMPLANT
BNDG COHESIVE 6X5 TAN STRL LF (GAUZE/BANDAGES/DRESSINGS) ×3 IMPLANT
CANISTER SUCT 1200ML W/VALVE (MISCELLANEOUS) ×3 IMPLANT
CHLORAPREP W/TINT 26 (MISCELLANEOUS) ×6 IMPLANT
COVER WAND RF STERILE (DRAPES) ×3 IMPLANT
DRAPE 3/4 80X56 (DRAPES) ×3 IMPLANT
DRAPE C-ARMOR (DRAPES) ×3 IMPLANT
DRILL 4.3MMS DISTAL GRADUATED (BIT) ×3
DRSG OPSITE POSTOP 3X4 (GAUZE/BANDAGES/DRESSINGS) IMPLANT
DRSG OPSITE POSTOP 4X6 (GAUZE/BANDAGES/DRESSINGS) IMPLANT
ELECT CAUTERY BLADE 6.4 (BLADE) ×3 IMPLANT
ELECT REM PT RETURN 9FT ADLT (ELECTROSURGICAL) ×3
ELECTRODE REM PT RTRN 9FT ADLT (ELECTROSURGICAL) ×1 IMPLANT
GAUZE SPONGE 4X4 12PLY STRL (GAUZE/BANDAGES/DRESSINGS) ×3 IMPLANT
GLOVE BIO SURGEON STRL SZ8 (GLOVE) ×6 IMPLANT
GLOVE INDICATOR 8.0 STRL GRN (GLOVE) ×3 IMPLANT
GOWN STRL REUS W/ TWL LRG LVL3 (GOWN DISPOSABLE) ×1 IMPLANT
GOWN STRL REUS W/ TWL XL LVL3 (GOWN DISPOSABLE) ×1 IMPLANT
GOWN STRL REUS W/TWL LRG LVL3 (GOWN DISPOSABLE) ×3
GOWN STRL REUS W/TWL XL LVL3 (GOWN DISPOSABLE) ×3
GUIDEPIN VERSANAIL DSP 3.2X444 (ORTHOPEDIC DISPOSABLE SUPPLIES) ×2 IMPLANT
GUIDEWIRE BALL NOSE 100CM (WIRE) ×2 IMPLANT
MAT ABSORB  FLUID 56X50 GRAY (MISCELLANEOUS) ×2
MAT ABSORB FLUID 56X50 GRAY (MISCELLANEOUS) ×1 IMPLANT
NAIL AFFIXUS RT 11X340MM (Nail) ×2 IMPLANT
NDL FILTER BLUNT 18X1 1/2 (NEEDLE) ×1 IMPLANT
NEEDLE FILTER BLUNT 18X 1/2SAF (NEEDLE) ×2
NEEDLE FILTER BLUNT 18X1 1/2 (NEEDLE) ×1 IMPLANT
NEEDLE HYPO 22GX1.5 SAFETY (NEEDLE) ×3 IMPLANT
NS IRRIG 500ML POUR BTL (IV SOLUTION) ×3 IMPLANT
PACK HIP COMPR (MISCELLANEOUS) ×3 IMPLANT
SCREW BONE CORTICAL 5.0X42 (Screw) ×2 IMPLANT
SCREW LAG HIP NAIL 10.5X95 (Screw) ×2 IMPLANT
STAPLER SKIN PROX 35W (STAPLE) ×3 IMPLANT
STRAP SAFETY 5IN WIDE (MISCELLANEOUS) ×3 IMPLANT
SUT VIC AB 0 CT1 36 (SUTURE) ×3 IMPLANT
SUT VIC AB 1 CT1 36 (SUTURE) ×3 IMPLANT
SUT VIC AB 2-0 CT1 (SUTURE) ×6 IMPLANT
SYR 10ML LL (SYRINGE) ×3 IMPLANT
SYR 30ML LL (SYRINGE) ×3 IMPLANT
TAPE MICROFOAM 4IN (TAPE) ×3 IMPLANT

## 2018-12-13 NOTE — Op Note (Signed)
12/13/2018  1:47 PM  Patient:   [name]  Pre-Op Diagnosis:   Closed displaced intertrochanteric fracture, right hip.  Post-Op Diagnosis:   Same  Procedure:   Reduction and internal fixation of displaced intertrochanteric right hip fracture with Biomet Affixis TFN nail.  Surgeon:   Pascal Lux, MD  Assistant:   None  Anesthesia:   Spinal  Findings:   As above  Complications:   None  EBL:   25 cc  Fluids:   400 cc crystalloid  UOP:   75 cc  TT:   None  Drains:   None  Closure:   Staples  Implants:   Biomet Affixis 11 x 340 mm TFN with a 95 mm lag screw and a 42 mm distal interlocking screw  Brief Clinical Note:   The patient is a 85 year old female who sustained the above-noted injury yesterday when she apparently lost her balance and fell in her home. She was brought to the emergency room where x-rays demonstrated the above-noted injury. The patient has been cleared medically and presents at this time for reduction and internal fixation of the displaced intertrochanteric right hip fracture.  Procedure:   The patient was brought into the operating room. After adequate spinal anesthesia was obtained, the patient was lain in the supine position on the fracture table. The uninjured leg was placed in a flexed and abducted position while the injured lower extremity was placed in longitudinal traction. The fracture was reduced using longitudinal traction and internal rotation. The adequacy of reduction was verified fluoroscopically in AP and lateral projections and found to be near anatomic. The lateral aspects of the right hip and thigh were prepped with ChloraPrep solution before being draped sterilely. Preoperative antibiotics were administered. A timeout was performed to verify the appropriate surgical site. The greater trochanter was identified fluoroscopically and an approximately 3 cm incision made about 2-3 fingerbreadths above the tip of the greater trochanter. The  incision was carried down through the subcutaneous tissues to expose the gluteal fascia. This was split the length of the incision, providing access to the tip of the trochanter. Under fluoroscopic guidance, a guidewire was drilled through the tip of the trochanter into the proximal metaphysis to the level of the lesser trochanter. After verifying its position fluoroscopically in AP and lateral projections, it was overreamed with the initial reamer to the depth of the lesser trochanter. A guidewire was passed down through the femoral canal to the supracondylar region. The adequacy of guidewire position was verified fluoroscopically in AP and lateral projections before the length of the guidewire within the canal was measured and found to be 355 mm. Therefore, a 340 mm length nail was selected. The guidewire was overreamed sequentially using the flexible reamers, beginning with a 9.5 mm reamer and progressing to a 12.5 mm reamer. This provided good cortical chatter. The 11 x 340 mm Biomet Affixis TFN rod was selected and advanced to the appropriate depth, as verified fluoroscopically.   The guide system for the lag screw was positioned and advanced through an approximately 2 cm stab incision over the lateral aspect of the proximal femur. The guidewire was drilled up through the trochanteric femoral nail and into the femoral neck to rest within 5 mm of subchondral bone. After verifying its position in the femoral neck and head in both AP and lateral projections, the guidewire was measured and found to be optimally replicated by a 95 mm lag screw. The guidewire was overreamed to the appropriate  depth before the lag screw was inserted and advanced to the appropriate depth as verified fluoroscopically in AP and lateral projections. The locking screw was advanced, then backed off a quarter turn to set the lag screw. Again the adequacy of hardware position and fracture reduction was verified fluoroscopically in AP and  lateral projections and found to be excellent.  Attention was directed distally. Using the "perfect circle" technique, the leg and fluoroscopy machine were positioned appropriately. An approximately 1.5 cm stab incision was made over the skin at the appropriate point before the drill bit was advanced through the cortex and across the static hole of the nail. The appropriate length of the screw was determined before the 42 mm distal interlocking screw was positioned, then advanced and tightened securely. Again the adequacy of screw position was verified fluoroscopically in AP and lateral projections and found to be excellent.  The wounds were irrigated thoroughly with sterile saline solution before the abductor fascia was reapproximated using #1 Vicryl interrupted sutures. The subcutaneous tissues were closed using 2-0 Vicryl interrupted sutures. The skin was closed using staples. A total of 30 cc of 0.5% Sensorcaine with epinephrine was injected in and around all incisions. Sterile occlusive dressings were applied to all wounds before the patient was transferred back to his/her hospital bed. The patient was then transferred to the recovery room in satisfactory condition after tolerating the procedure well.

## 2018-12-13 NOTE — Progress Notes (Addendum)
[name] at [place] NAME: [name]    MR#:  [id number]  DATE OF BIRTH:  [birth year]  SUBJECTIVE:   Patient remains confused this morning. She is stating that her hip hurts. She states she is the not in the hospital.  REVIEW OF SYSTEMS:  ROS- unable to obtain due to confusion  DRUG ALLERGIES:   Allergies  Allergen Reactions  . Amlodipine Other (See Comments)    Leg edema   . Atenolol Other (See Comments)    REACTION: bradycardia  . Erythromycin Base Other (See Comments)    REACTION: stomach upset  . Lisinopril Other (See Comments)    REACTION: cough  (20 mg)   VITALS:  Blood pressure (!) 154/61, pulse 91, temperature 99.3 F (37.4 C), temperature source Tympanic, resp. rate 18, height 5\' 3"  (1.6 m), weight 55.7 kg, SpO2 96 %. PHYSICAL EXAMINATION:  Physical Exam  GENERAL:  Laying in the bed with no acute distress. +confused, talking but not making sense. HEENT: Head atraumatic, normocephalic. Pupils equal, round, reactive to light and accommodation. No scleral icterus. Extraocular muscles intact. Oropharynx and nasopharynx clear.  NECK:  Supple, no jugular venous distention. No thyroid enlargement. LUNGS: Lungs are clear to auscultation bilaterally. No wheezes, crackles, rhonchi. No use of accessory muscles of respiration.  CARDIOVASCULAR: RRR, S1, S2 normal. No murmurs, rubs, or gallops.  ABDOMEN: Soft, nontender, nondistended. Bowel sounds present.  EXTREMITIES: No pedal edema, cyanosis, or clubbing.  NEUROLOGIC: CN 2-12 intact, no focal deficits. Unable to assess muscle strength in the lower extremities due to pain. Sensation intact throughout. Gait not checked.  PSYCHIATRIC: The patient is alert. Does not answer questions of orientation. SKIN: No obvious rash, lesion, or ulcer.  LABORATORY PANEL:  Female CBC Recent Labs  Lab 12/13/18 0538 12/13/18 1135  WBC 14.7*  --   HGB 7.8* 8.2*  HCT 25.4* 26.2*  PLT 434*  --     ------------------------------------------------------------------------------------------------------------------ Chemistries  Recent Labs  Lab 12/13/18 0538  NA 139  K 4.1  CL 109  CO2 21*  GLUCOSE 126*  BUN 23  CREATININE 0.86  CALCIUM 8.4*   RADIOLOGY:  No results found. ASSESSMENT AND PLAN:   Right hip fracture in the setting of unwitnessed fall- patient just recently broke her left hip in August of this year. -CT head and C-spine were unremarkable -Right hip x-ray with acute intertrochanteric proximal right femur fracture without significant displacement -Orthopedic surgery consult- plan for surgery today -Pain control -Holding anticoagulation for surgery- can restart after -PT consult after surgery  Acute metabolic encephalopathy- likely secondary to pain with some underlying dementia. -Haldol as needed agitation -Monitor closely  Leukocytosis- likely reactive in the setting of hip fracture.  No signs of infection. WBCs have improved. -Monitor  Iron deficiency anemia- hemoglobin low but at baseline. -Anemia panel with low iron and saturation ratio -Will give a dose of IV iron today -Check hemoglobin after surgery-will likely need a blood transfusion -FOBT pending  Hypertension-normotensive in the ED -Continue home losartan  Prediabetes-most recent A1c was 5.7%. -Will hold off on SSI and CBG checks for now  Hypothyroidism-stable -Continue home Synthroid  All the records are reviewed and case discussed with Care Management/Social Worker. Management plans discussed with the patient, family and they are in agreement.  CODE STATUS: Full Code  TOTAL TIME TAKING CARE OF THIS PATIENT: 38 minutes.   More than 50% of the time was spent in counseling/coordination of care: YES  POSSIBLE D/C IN 2-3 DAYS, DEPENDING ON CLINICAL CONDITION.   Berna Spare Mayo M.D on 12/13/2018 at 12:01 PM  Between 7am to 6pm - Pager [phone]  After 6pm go to  [email or web address] - Proofreader  [facility]  Office  [phone]  CC: Primary care physician; McLean-Scocuzza, Nino Glow, MD  Note: This dictation was prepared with Dragon dictation along with smaller phrase technology. Any transcriptional errors that result from this process are unintentional.

## 2018-12-13 NOTE — Consult Note (Addendum)
[place] Nurse wound consult note Reason for Consult: Consult requested for sacrum and left heel.  Pt is currently in surgery; unable to assess the locations.  Reviewed progress notes and the nursing flowsheet for measurements Wound type:  Left heel with deep tissue pressure injury; 4X3cm Sacrum with stage 4 pressure injury; 4X3X1.5cm Pressure Injury POA: Yes Dressing procedure/placement/frequency: Float heel to reduce pressure, foam dressing to protect from further injury.  Moist gauze dressing to sacrum to promote healing.  [place] team will assess site in person on Wed and revise topical treatment plan at that time if indicated.  Julien Girt MSN, RN, [place], [place], [place]

## 2018-12-13 NOTE — Anesthesia Post-op Follow-up Note (Signed)
Anesthesia QCDR form completed.        

## 2018-12-13 NOTE — Anesthesia Procedure Notes (Signed)
Spinal  Patient location during procedure: OR Start time: 12/13/2018 12:05 PM End time: 12/13/2018 12:10 PM Staffing Anesthesiologist: Emmie Niemann, MD Resident/CRNA: Hedda Slade, CRNA Performed: anesthesiologist and resident/CRNA  Preanesthetic Checklist Completed: patient identified, site marked, surgical consent, pre-op evaluation, timeout performed, IV checked, risks and benefits discussed and monitors and equipment checked Spinal Block Patient position: left lateral decubitus Prep: ChloraPrep Patient monitoring: heart rate, continuous pulse ox, blood pressure and cardiac monitor Approach: midline Location: L3-4 Injection technique: single-shot Needle Needle type: Whitacre and Introducer  Needle gauge: 24 G Needle length: 9 cm Additional Notes Negative paresthesia. Negative blood return. Positive free-flowing CSF. Expiration date of kit checked and confirmed. Patient tolerated procedure well, without complications.

## 2018-12-13 NOTE — Transfer of Care (Signed)
Immediate Anesthesia Transfer of Care Note  Patient: [name]  Procedure(s) Performed: INTRAMEDULLARY (IM) NAIL FEMORAL, RIGHT (Right Hip)  Patient Location: PACU  Anesthesia Type:Spinal  Level of Consciousness: awake and alert   Airway & Oxygen Therapy: Patient Spontanous Breathing and Patient connected to face mask oxygen  Post-op Assessment: Report given to RN and Post -op Vital signs reviewed and stable  Post vital signs: Reviewed and stable  Last Vitals:  Vitals Value Taken Time  BP    Temp    Pulse    Resp    SpO2      Last Pain:  Vitals:   12/13/18 1109  TempSrc: Tympanic  PainSc: 0-No pain         Complications: No apparent anesthesia complications

## 2018-12-13 NOTE — Anesthesia Preprocedure Evaluation (Signed)
Anesthesia Evaluation  Patient identified by MRN, date of birth, ID band Patient awake and Patient confused    Reviewed: Allergy & Precautions, H&P , NPO status , Patient's Chart, lab work & pertinent test results  History of Anesthesia Complications Negative for: history of anesthetic complications  Airway Mallampati: III  TM Distance: <3 FB Neck ROM: limited    Dental  (+) Chipped, Poor Dentition, Missing   Pulmonary neg pulmonary ROS, neg shortness of breath, neg COPD,    breath sounds clear to auscultation- rhonchi (-) wheezing      Cardiovascular Exercise Tolerance: Good hypertension, (-) angina(-) Past MI and (-) DOE + dysrhythmias Atrial Fibrillation  Rhythm:Regular Rate:Normal - Systolic murmurs and - Diastolic murmurs    Neuro/Psych PSYCHIATRIC DISORDERS Anxiety negative neurological ROS     GI/Hepatic Neg liver ROS, GERD  ,  Endo/Other  diabetes (borderline)Hypothyroidism   Renal/GU Renal InsufficiencyRenal disease     Musculoskeletal  (+) Arthritis ,   Abdominal (+) - obese,   Peds  Hematology negative hematology ROS (+)   Anesthesia Other Findings Past Medical History: 12/2002: Anxiety No date: Arthritis     Comment:  hands No date: Cancer [facility])     Comment:  breast left arm No date: Cancer ([place])     Comment:  uterine 11/2004: Diabetes mellitus     Comment:  Type 2 No date: Dyspnea     Comment:  WITH EXERTION No date: GERD (gastroesophageal reflux disease) 11/2004: Hyperlipidemia 12/2002: Hypertension No date: Hypothyroidism 02/2002: Osteopenia     Comment:  02/2002 Dexa osteopenia// Dexa 02/26/2004 (Dr. Ubaldo Glassing)               stable/IMR femur/spine No date: Spinal stenosis No date: Thyroid disease     Comment:  Hypothryoid after radio ablation of thyroid// Dr.               Ronnald Collum endocrinologist No date: Vertigo  Past Surgical History: 1990: ABDOMINAL HYSTERECTOMY     Comment:   endometrial adenoca 1980: BREAST SURGERY     Comment:  left breast lumpectomy //abscess of breast evac. 12/11/2016: CATARACT EXTRACTION W/PHACO; Left     Comment:  Procedure: CATARACT EXTRACTION PHACO AND INTRAOCULAR               LENS PLACEMENT (IOC);  Surgeon: Eulogio Bear, MD;                Location: [facility] ORS;  Service: Ophthalmology;  Laterality:              Left;  [place] 1:18.4 AP 36.6% CDE 16.13 01/01/2017: CATARACT EXTRACTION W/PHACO; Right     Comment:  Procedure: CATARACT EXTRACTION PHACO AND INTRAOCULAR               LENS PLACEMENT (IOC);  Surgeon: Eulogio Bear, MD;                Location: [facility] ORS;  Service: Ophthalmology;  Laterality:              Right;  [place]: 01:45.5 AP% 16.6 CDE 18.21 Fluid pack lot               # [id number] H 07/10/1987: CHOLECYSTECTOMY No date: EYE SURGERY     Comment:  Tear Duct surgery, b/l cataract 2019  No date: MASTECTOMY; Left  BMI    Body Mass Index: 20.58 kg/m      Reproductive/Obstetrics negative OB ROS  Lab Results  Component Value Date   WBC 14.7 (H) 12/13/2018   HGB 7.8 (L) 12/13/2018   HCT 25.4 (L) 12/13/2018   MCV 94.1 12/13/2018   PLT 434 (H) 12/13/2018    Anesthesia Physical  Anesthesia Plan  ASA: III  Anesthesia Plan: Spinal   Post-op Pain Management:    Induction:   PONV Risk Score and Plan: 2 and Propofol infusion  Airway Management Planned: Natural Airway and Nasal Cannula  Additional Equipment:   Intra-op Plan:   Post-operative Plan:   Informed Consent: I have reviewed the patients History and Physical, chart, labs and discussed the procedure including the risks, benefits and alternatives for the proposed anesthesia with the patient or authorized representative who has indicated his/her understanding and acceptance.     Dental Advisory Given and Consent reviewed with POA (phone consent from pt's friend Shelva Majestic due to patient  confusion)  Plan Discussed with: Anesthesiologist, CRNA and Surgeon  Anesthesia Plan Comments:         Anesthesia Quick Evaluation

## 2018-12-13 NOTE — H&P (Signed)
Paper H&P to be scanned into permanent record. H&P reviewed and patient re-examined. No changes.  Telephone consent for this procedure has been obtained from the patient's healthcare proxy, Rosanne Gutting, this morning.  Her cell phone number is: [phone].

## 2018-12-14 ENCOUNTER — Encounter: Payer: Self-pay | Admitting: Surgery

## 2018-12-14 DIAGNOSIS — Z515 Encounter for palliative care: Secondary | ICD-10-CM

## 2018-12-14 DIAGNOSIS — S72144A Nondisplaced intertrochanteric fracture of right femur, initial encounter for closed fracture: Secondary | ICD-10-CM

## 2018-12-14 DIAGNOSIS — Z7189 Other specified counseling: Secondary | ICD-10-CM

## 2018-12-14 LAB — CBC
HCT: 20.6 % — ABNORMAL LOW (ref 36.0–46.0)
Hemoglobin: 6.5 g/dL — ABNORMAL LOW (ref 12.0–15.0)
MCH: 29.1 pg (ref 26.0–34.0)
MCHC: 31.6 g/dL (ref 30.0–36.0)
MCV: 92.4 fL (ref 80.0–100.0)
Platelets: 336 10*3/uL (ref 150–400)
RBC: 2.23 MIL/uL — ABNORMAL LOW (ref 3.87–5.11)
RDW: 16.8 % — ABNORMAL HIGH (ref 11.5–15.5)
WBC: 12.8 10*3/uL — ABNORMAL HIGH (ref 4.0–10.5)
nRBC: 0 % (ref 0.0–0.2)

## 2018-12-14 LAB — BASIC METABOLIC PANEL
Anion gap: 7 (ref 5–15)
BUN: 21 mg/dL (ref 8–23)
CO2: 21 mmol/L — ABNORMAL LOW (ref 22–32)
Calcium: 8.1 mg/dL — ABNORMAL LOW (ref 8.9–10.3)
Chloride: 109 mmol/L (ref 98–111)
Creatinine, Ser: 0.87 mg/dL (ref 0.44–1.00)
GFR calc Af Amer: >60 mL/min (ref >60)
GFR calc non Af Amer: 59 mL/min — ABNORMAL LOW (ref >60)
Glucose, Bld: 120 mg/dL — ABNORMAL HIGH (ref 70–99)
Potassium: 3.5 mmol/L (ref 3.5–5.1)
Sodium: 137 mmol/L (ref 135–145)

## 2018-12-14 LAB — HEMOGLOBIN AND HEMATOCRIT, BLOOD
HCT: 26.2 % — ABNORMAL LOW (ref 36.0–46.0)
Hemoglobin: 8.4 g/dL — ABNORMAL LOW (ref 12.0–15.0)

## 2018-12-14 LAB — PREPARE RBC (CROSSMATCH)

## 2018-12-14 MED ORDER — SODIUM CHLORIDE 0.9% IV SOLUTION
Freq: Once | INTRAVENOUS | Status: AC
  Administered 2018-12-14: 12:00:00 via INTRAVENOUS

## 2018-12-14 MED ORDER — CALCIUM CARBONATE 1250 (500 CA) MG PO TABS
1.0000 | ORAL_TABLET | Freq: Every day | ORAL | Status: DC
  Administered 2018-12-15: 500 mg via ORAL
  Filled 2018-12-14: qty 1

## 2018-12-14 NOTE — Progress Notes (Signed)
PT Cancellation Note  Patient Details Name: CATHRINE KHALEEL MRN: [id number] DOB: 06-Nov-1926   Cancelled Treatment:    Reason Eval/Treat Not Completed: Medical issues which prohibited therapy.  Order received and chart reviewed.  Per documentation, pt's Hgn is currently 6.5 with pending blood transfusion.  Will check back when pt is available and appropriate.  Roxanne Gates, PT, DPT  Roxanne Gates 12/14/2018, 9:33 AM

## 2018-12-14 NOTE — Progress Notes (Signed)
[place] at [place] NAME: [name]    MR#:  [id number]  DATE OF BIRTH:  07/18/1926  SUBJECTIVE:   Patient seems to be less confused this morning. She says her hip is feeling better.  She is concerned about being able to eat her breakfast due to poor eyesight.  REVIEW OF SYSTEMS:  Review of Systems  Constitutional: Negative for chills and fever.  HENT: Negative for congestion and sore throat.   Eyes: Positive for blurred vision. Negative for double vision and pain.  Respiratory: Negative for cough and shortness of breath.   Cardiovascular: Negative for chest pain and palpitations.  Gastrointestinal: Negative for nausea and vomiting.  Genitourinary: Negative for dysuria and urgency.  Musculoskeletal: Positive for joint pain. Negative for back pain and neck pain.  Neurological: Negative for dizziness and headaches.  Psychiatric/Behavioral: Negative for depression. The patient is not nervous/anxious.     DRUG ALLERGIES:   Allergies  Allergen Reactions  . Amlodipine Other (See Comments)    Leg edema   . Atenolol Other (See Comments)    REACTION: bradycardia  . Erythromycin Base Other (See Comments)    REACTION: stomach upset  . Lisinopril Other (See Comments)    REACTION: cough  (20 mg)   VITALS:  Blood pressure (!) 122/43, pulse 71, temperature 97.9 F (36.6 C), temperature source Oral, resp. rate 16, height 5\' 3"  (1.6 m), weight 55.7 kg, SpO2 93 %. PHYSICAL EXAMINATION:  Physical Exam  GENERAL:  Laying in the bed with no acute distress.  HEENT: Head atraumatic, normocephalic. Pupils equal, round, reactive to light and accommodation. No scleral icterus. Extraocular muscles intact. Oropharynx and nasopharynx clear.  NECK:  Supple, no jugular venous distention. No thyroid enlargement. LUNGS: Lungs are clear to auscultation bilaterally. No wheezes, crackles, rhonchi. No use of accessory muscles of respiration.  CARDIOVASCULAR:  RRR, S1, S2 normal. No murmurs, rubs, or gallops.  ABDOMEN: Soft, nontender, nondistended. Bowel sounds present.  EXTREMITIES: No pedal edema, cyanosis, or clubbing. + Dry dressing in place over right lateral hip. NEUROLOGIC: CN 2-12 intact, no focal deficits. + Global weakness. Sensation intact throughout. Gait not checked.  PSYCHIATRIC: The patient is alert and oriented x 1.  SKIN: No obvious rash, lesion, or ulcer.  LABORATORY PANEL:  Female CBC Recent Labs  Lab 12/14/18 0444  WBC 12.8*  HGB 6.5*  HCT 20.6*  PLT 336   ------------------------------------------------------------------------------------------------------------------ Chemistries  Recent Labs  Lab 12/14/18 0444  NA 137  K 3.5  CL 109  CO2 21*  GLUCOSE 120*  BUN 21  CREATININE 0.87  CALCIUM 8.1*   RADIOLOGY:  Dg C-arm 1-60 Min  Result Date: 12/13/2018 CLINICAL DATA:  ORIF RIGHT femur fracture. EXAM: RIGHT FEMUR 2 VIEWS; DG C-ARM 1-60 MIN COMPARISON:  12/12/2018 radiographs FINDINGS: Intraoperative spot views of the RIGHT femur are submitted postoperatively for interpretation. Intramedullary nail and screw noted traversing an intertrochanteric fracture in near-anatomic alignment. No definite complicating features. IMPRESSION: ORIF RIGHT intertrochanteric femur fracture without definite complicating features. Electronically Signed   By: Margarette Canada M.D.   On: 12/13/2018 13:53   Dg Femur, Min 2 Views Right  Result Date: 12/13/2018 CLINICAL DATA:  ORIF RIGHT femur fracture. EXAM: RIGHT FEMUR 2 VIEWS; DG C-ARM 1-60 MIN COMPARISON:  12/12/2018 radiographs FINDINGS: Intraoperative spot views of the RIGHT femur are submitted postoperatively for interpretation. Intramedullary nail and screw noted traversing an intertrochanteric fracture in near-anatomic alignment. No definite complicating features. IMPRESSION: ORIF RIGHT  intertrochanteric femur fracture without definite complicating features. Electronically Signed   By:  Margarette Canada M.D.   On: 12/13/2018 13:53   ASSESSMENT AND PLAN:   Right hip fracture in the setting of unwitnessed fall- patient just recently broke her left hip in August of this year. -CT head and C-spine were unremarkable -s/p repair on 10/5 -Orthopedic surgery following -Pain control -Lovenox restarted -PT eval pending -Palliative care consult  Acute blood loss anemia with a history of iron deficiency anemia- due to surgery. Hemoglobin dropped from 8.2 > 6.5 this morning. -Anemia panel with low iron and saturation ratio -Given a dose of IV iron 10/5 -1 unit PRBC ordered for today -Check post-transfusion H/H  Acute metabolic encephalopathy-  patient does have some underlying dementia.  Altered mental status has improved. -Haldol as needed agitation -Monitor closely  Leukocytosis- likely reactive in the setting of hip fracture.  No signs of infection. WBCs continue to improve. -Monitor  Hypertension-normotensive -Continue home losartan  Prediabetes-most recent A1c was 5.7%. -Will hold off on SSI and CBG checks for now  Hypothyroidism-stable -Continue home Synthroid  All the records are reviewed and case discussed with Care Management/Social Worker. Management plans discussed with the patient, family and they are in agreement.  CODE STATUS: Full Code  TOTAL TIME TAKING CARE OF THIS PATIENT: 38 minutes.   More than 50% of the time was spent in counseling/coordination of care: YES  POSSIBLE D/C IN 1-2 DAYS, DEPENDING ON CLINICAL CONDITION.   Berna Spare  M.D on 12/14/2018 at 11:18 AM  Between 7am to 6pm - Pager - [phone]  After 6pm go to [email or web address] - Proofreader  [facility]  Office  [phone]  CC: Primary care physician; McLean-Scocuzza, Nino Glow, MD  Note: This dictation was prepared with Dragon dictation along with smaller phrase technology. Any transcriptional errors that result from this process are  unintentional.

## 2018-12-14 NOTE — NC FL2 (Signed)
[place] LEVEL OF CARE SCREENING TOOL     IDENTIFICATION  Patient Name: [name] Birthdate: 05/02/1926 Sex: female Admission Date (Current Location): 12/12/2018  [place] and [place] Number:  Engineering geologist and Address:  [facility], [address] [place], [place] [address]      Provider Number: [id number]  Attending Physician Name and Address:  Sela Hua, MD  Relative Name and Phone Number:       Current Level of Care: Hospital Recommended Level of Care: [place] Prior Approval Number:    Date Approved/Denied:   PASRR Number: [id number]  Discharge Plan: SNF    Current Diagnoses: Patient Active Problem List   Diagnosis Date Noted  . Closed right hip fracture ([place]) 12/12/2018  . Comminuted fracture of left hip ([place]) 10/27/2018  . Osteoarthritis 03/12/2018  . Overactive bladder 03/12/2018  . Carotid artery stenosis 01/08/2018  . Vitamin D deficiency 01/08/2018  . Poor balance 10/16/2017  . Fall at home 10/15/2017  . Buttock pain 10/15/2017  . Contusion, buttock 10/15/2017  . Macular degeneration 03/20/2017  . Thickened nails 03/20/2017  . History of rectal bleeding 05/21/2016  . Eczema 05/16/2014  . Spinal stenosis of lumbar region 02/15/2014  . Right foot pain 09/23/2013  . Right shoulder pain 09/23/2013  . Other screening mammogram 04/12/2013  . Dizziness 05/21/2012  . Falls frequently 05/21/2012  . Urge incontinence 12/26/2011  . Hip pain, bilateral 09/15/2011  . Special screening for malignant neoplasms, colon 07/10/2010  . Hypothyroidism 04/30/2010  . CONSTIPATION 04/30/2010  . STRESS REACTION, ACUTE, WITH EMOTIONAL DISTURBANCE 10/09/2009  . Prediabetes 07/22/2009  . Malignant neoplasm of female breast ([place]) 12/29/2007  . Spinal stenosis, lumbar 06/22/2006  . PLUMMER'S DISEASE 06/16/2006  . Hyperlipidemia 06/16/2006  . ANXIETY 06/16/2006  . Essential hypertension 06/16/2006   . Osteopenia 06/16/2006    Orientation RESPIRATION BLADDER Height & Weight     Self, Place  Normal Incontinent Weight: 122 lb 12.7 oz (55.7 kg) Height:  5\' 3"  (160 cm)  BEHAVIORAL SYMPTOMS/MOOD NEUROLOGICAL BOWEL NUTRITION STATUS      Continent Diet(Diet: Heart Healthy/ Carb Modified.)  AMBULATORY STATUS COMMUNICATION OF NEEDS Skin   Extensive Assist Verbally Surgical wounds, PU Stage and Appropriate Care(Incision: Right Hip. Left heel deep tissue pressure injury. Pressure ulcer stage 4 on sacrum.)                       Personal Care Assistance Level of Assistance  Bathing, Feeding, Dressing Bathing Assistance: Limited assistance Feeding assistance: Limited assistance Dressing Assistance: Limited assistance     Functional Limitations Info  Sight, Hearing, Speech Sight Info: Impaired Hearing Info: Adequate Speech Info: Adequate    SPECIAL CARE FACTORS FREQUENCY  PT (By licensed PT), OT (By licensed OT)(Will have outpatient palliative care to follow, conpany: [facility] and palliative care.)     PT Frequency: 5 OT Frequency: 5            Contractures      Additional Factors Info  Code Status, Allergies Code Status Info: Full Code. Allergies Info: Amlodipine, Atenolol, Erythromycin Base, Lisinopril           Current Medications (12/14/2018):  This is the current hospital active medication list Current Facility-Administered Medications  Medication Dose Route Frequency Provider Last Rate Last Dose  . 0.9 %  sodium chloride infusion   Intravenous Continuous Poggi, Marshall Cork, MD 75 mL/hr at 12/13/18 2331    .  acetaminophen (TYLENOL) tablet 325-650 mg  325-650 mg Oral Q6H PRN Poggi, Marshall Cork, MD      . aspirin EC tablet 81 mg  81 mg Oral Daily Poggi, Marshall Cork, MD      . bisacodyl (DULCOLAX) suppository 10 mg  10 mg Rectal Daily PRN Poggi, Marshall Cork, MD      . Derrill Memo ON 12/15/2018] calcium carbonate (OS-CAL - dosed in mg of elemental calcium) tablet 500 mg of elemental  calcium  1 tablet Oral Q breakfast Mayo, Pete Pelt, MD      . Chlorhexidine Gluconate Cloth 2 % PADS 6 each  6 each Topical Daily Poggi, Marshall Cork, MD   6 each at 12/14/18 0919  . diphenhydrAMINE (BENADRYL) 12.5 MG/5ML elixir 12.5-25 mg  12.5-25 mg Oral Q4H PRN Poggi, Marshall Cork, MD      . docusate sodium (COLACE) capsule 100 mg  100 mg Oral BID Poggi, Marshall Cork, MD   100 mg at 12/14/18 0920  . enoxaparin (LOVENOX) injection 40 mg  40 mg Subcutaneous Q24H Poggi, Marshall Cork, MD      . gabapentin (NEURONTIN) capsule 100 mg  100 mg Oral BID Poggi, Marshall Cork, MD   100 mg at 12/14/18 0920  . haloperidol lactate (HALDOL) injection 1 mg  1 mg Intravenous Q6H PRN Poggi, Marshall Cork, MD   1 mg at 12/13/18 0836  . HYDROcodone-acetaminophen (NORCO/VICODIN) 5-325 MG per tablet 1-2 tablet  1-2 tablet Oral Q4H PRN Poggi, Marshall Cork, MD      . levothyroxine (SYNTHROID) tablet 50 mcg  50 mcg Oral Daily Poggi, Marshall Cork, MD   50 mcg at 12/14/18 0530  . losartan (COZAAR) tablet 100 mg  100 mg Oral Daily Poggi, Marshall Cork, MD      . magnesium hydroxide (MILK OF MAGNESIA) suspension 30 mL  30 mL Oral Daily PRN Poggi, Marshall Cork, MD   30 mL at 12/14/18 0530  . metoCLOPramide (REGLAN) tablet 5-10 mg  5-10 mg Oral Q8H PRN Poggi, Marshall Cork, MD       Or  . metoCLOPramide (REGLAN) injection 5-10 mg  5-10 mg Intravenous Q8H PRN Poggi, Marshall Cork, MD      . morphine 2 MG/ML injection 2 mg  2 mg Intravenous Q3H PRN Poggi, Marshall Cork, MD   2 mg at 12/13/18 1605  . ondansetron (ZOFRAN) tablet 4 mg  4 mg Oral Q6H PRN Poggi, Marshall Cork, MD       Or  . ondansetron (ZOFRAN) injection 4 mg  4 mg Intravenous Q6H PRN Poggi, Marshall Cork, MD      . polyethylene glycol (MIRALAX / GLYCOLAX) packet 17 g  17 g Oral Daily PRN Poggi, Marshall Cork, MD   17 g at 12/14/18 1205  . sodium phosphate (FLEET) 7-19 GM/118ML enema 1 enema  1 enema Rectal Once PRN Poggi, Marshall Cork, MD      . traMADol Veatrice Bourbon) tablet 50 mg  50 mg Oral Q6H PRN Poggi, Marshall Cork, MD         Discharge Medications: Please see discharge  summary for a list of discharge medications.  Relevant Imaging Results:  Relevant Lab Results:   Additional Information SSN: [id number]  Zhanna Melin, Veronia Beets, LCSW

## 2018-12-14 NOTE — Consult Note (Signed)
Consultation Note Date: 12/14/2018   Patient Name: [name]  DOB: 01-10-1927  MRN: [id number]  Age / Sex: 85 y.o., female  PCP: McLean-Scocuzza, Nino Glow, MD Referring Physician: Sela Hua, MD  Reason for Consultation: Establishing goals of care  HPI/Patient Profile: 85 y.o. female  with past medical history of hypertension, hyperlipidemia, hypothyroidism, osteopenia, left breast cancer, uterine cancer, spinal stenosis, anxiety admitted on 12/12/2018 with hip fracture.   Clinical Assessment and Goals of Care: [name] is resting quietly in bed.  She greets me making and mostly keeping eye contact.  She is calm and cooperative, not fearful.  She is alert and oriented, able to make her needs known.  There is no family at bedside at this time.  We talked about her plan to return to [facility] for rehab, then home with hospice.  [name] tells me that her goal is to pass away at home.  We talked about hospice benefits, who will help her at home.  We talked about CODE STATUS.  [name] tells me that she would like to allow a natural death (DNR) orders made.  Conference with social work/case management related to goals of care, disposition, CODE STATUS.  HCPOA    NEXT OF KIN -  Hillery Jacks. Son Starrla Metoxen.    SUMMARY OF RECOMMENDATIONS   Return to [facility] for rehab, palliative outpatient to follow. Home with the benefits of [facility].  Code Status/Advance Care Planning:  DNR  Symptom Management:   Per hospitalist/orthopedic, no additional needs at this time.  Palliative Prophylaxis:   Frequent Pain Assessment and Turn Reposition  Additional Recommendations (Limitations, Scope, Preferences):  Full Scope Treatment  Psycho-social/Spiritual:   Desire for further Chaplaincy support:no  Additional Recommendations: Caregiving  Support/Resources and Education  on Hospice  Prognosis:   < 6 months  Discharge Planning: [place] for rehab with Palliative care service follow-up      Primary Diagnoses: Present on Admission: . Closed right hip fracture ([place])   I have reviewed the medical record, interviewed the patient and family, and examined the patient. The following aspects are pertinent.  Past Medical History:  Diagnosis Date  . Anxiety 12/2002  . Arthritis    hands  . Cancer ([place])    breast left arm  . Cancer ([place])    uterine  . Diabetes mellitus 11/2004   Type 2  . Dyspnea    WITH EXERTION  . GERD (gastroesophageal reflux disease)   . Hyperlipidemia 11/2004  . Hypertension 12/2002  . Hypothyroidism   . Osteopenia 02/2002   02/2002 Dexa osteopenia// Dexa 02/26/2004 (Dr. Ubaldo Glassing) stable/IMR femur/spine  . Spinal stenosis   . 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 Ronnald Collum endocrinologist  . Vertigo    Social History   Socioeconomic History  . Marital status: Married    Spouse name: Not on file  . Number of children: Not on file  . Years of education: Not on file  .  Highest education level: Not on file  Occupational History  . Not on file  Social Needs  . Financial resource strain: Not on file  . Food insecurity    Worry: Not on file    Inability: Not on file  . Transportation needs    Medical: Not on file    Non-medical: Not on file  Tobacco Use  . Smoking status: Never Smoker  . Smokeless tobacco: Never Used  Substance and Sexual Activity  . Alcohol use: No    Alcohol/week: 0.0 standard drinks  . Drug use: No  . Sexual activity: Not on file  Lifestyle  . Physical activity    Days per week: Not on file    Minutes per session: Not on file  . Stress: Not on file  Relationships  . Social Herbalist on phone: Not on file    Gets together: Not on file    Attends religious service: Not on file    Active member of club or organization: Not on file     Attends meetings of clubs or organizations: Not on file    Relationship status: Not on file  Other Topics Concern  . Not on file  Social History Narrative   Takes care of husband full time who has alzheimers -- very difficult with little help   Relies on family I.e granddaughter and grandaughters other grandmother to bring to appts       Has dog [place]    1 son living 1 son died 23-Jun-2009 suicide    Family History  Problem Relation Age of Onset  . Alzheimer's disease Mother   . Heart disease Mother 55       angina  . Hypertension Mother   . Crohn's disease Mother        ? crohns disease  . Cancer Father 50       colon cancer  . Cancer Sister 3       breast cancer   . COPD Sister        emphysema (smoker)  . Heart disease Sister        angina  . Alcohol abuse Brother   . Diabetes Paternal Aunt   . Hypertension Sister   . Stroke Neg Hx    Scheduled Meds: . aspirin EC  81 mg Oral Daily  . [START ON 12/15/2018] calcium carbonate  1 tablet Oral Q breakfast  . Chlorhexidine Gluconate Cloth  6 each Topical Daily  . docusate sodium  100 mg Oral BID  . enoxaparin (LOVENOX) injection  40 mg Subcutaneous Q24H  . gabapentin  100 mg Oral BID  . levothyroxine  50 mcg Oral Daily  . losartan  100 mg Oral Daily   Continuous Infusions: . sodium chloride 75 mL/hr at 12/13/18 2331   PRN Meds:.acetaminophen, bisacodyl, diphenhydrAMINE, haloperidol lactate, HYDROcodone-acetaminophen, magnesium hydroxide, metoCLOPramide **OR** metoCLOPramide (REGLAN) injection, morphine injection, ondansetron **OR** ondansetron (ZOFRAN) IV, polyethylene glycol, sodium phosphate, traMADol Medications Prior to Admission:  Prior to Admission medications   Medication Sig Start Date End Date Taking? Authorizing Provider  aspirin 81 MG tablet Take 81 mg by mouth daily. Daily with food    Yes [provider]  Cholecalciferol 1.25 MG (50000 UT) capsule Take 1 capsule (50,000 Units total) by mouth once a  week. 03/12/18  Yes McLean-Scocuzza, Nino Glow, MD  gabapentin (NEURONTIN) 100 MG capsule TAKE ONE (1) CAPSULE BY MOUTH 2 TIMES DAILY 03/12/18  Yes McLean-Scocuzza, Nino Glow, MD  HYDROcodone-acetaminophen (  NORCO/VICODIN) 5-325 MG tablet Take 1-2 tablets by mouth every 4 (four) hours as needed for moderate pain. 11/01/18  Yes Duanne Guess, PA-C  levothyroxine (SYNTHROID, LEVOTHROID) 50 MCG tablet Take 1 tablet (50 mcg total) by mouth daily. 03/12/18  Yes McLean-Scocuzza, Nino Glow, MD  losartan (COZAAR) 100 MG tablet Take 1 tablet (100 mg total) by mouth daily. 11/01/18  Yes Gladstone Lighter, MD  mometasone (ELOCON) 0.1 % cream Apply 1 application topically daily. To affected areas behind ears 03/26/17  Yes Tower, Wynelle Fanny, MD  vitamin C (ASCORBIC ACID) 500 MG tablet Take 500 mg by mouth daily.   Yes [provider]  enoxaparin (LOVENOX) 40 MG/0.4ML injection Inject 0.4 mLs (40 mg total) into the skin daily for 14 days. 11/01/18 11/15/18  Duanne Guess, PA-C  polyethylene glycol powder (GLYCOLAX/MIRALAX) powder Take 17 g by mouth daily as needed. As directed for constipation 03/26/17   Tower, Wynelle Fanny, MD   Allergies  Allergen Reactions  . Amlodipine Other (See Comments)    Leg edema   . Atenolol Other (See Comments)    REACTION: bradycardia  . Erythromycin Base Other (See Comments)    REACTION: stomach upset  . Lisinopril Other (See Comments)    REACTION: cough  (20 mg)   Review of Systems  Unable to perform ROS: Age    Physical Exam Vitals signs and nursing note reviewed.  Constitutional:      General: She is not in acute distress.    Appearance: Normal appearance. She is normal weight.  HENT:     Head: Atraumatic.  Cardiovascular:     Rate and Rhythm: Normal rate.  Pulmonary:     Effort: Pulmonary effort is normal. No respiratory distress.  Abdominal:     General: Abdomen is flat. There is no distension.  Musculoskeletal:        General: No swelling.  Skin:    General: Skin  is warm and dry.  Neurological:     General: No focal deficit present.     Mental Status: She is alert.  Psychiatric:        Mood and Affect: Mood normal.        Behavior: Behavior normal.     Vital Signs: BP (!) 127/54 (BP Location: Right Arm)   Pulse 76   Temp 98.4 F (36.9 C) (Oral)   Resp 18   Ht 5\' 3"  (1.6 m)   Wt 55.7 kg   SpO2 99%   BMI 21.75 kg/m  Pain Scale: 0-10 POSS *See Group Information*: 1-Acceptable,Awake and alert Pain Score: 0-No pain   SpO2: SpO2: 99 % O2 Device:SpO2: 99 % O2 Flow Rate: .O2 Flow Rate (L/min): 6 L/min  IO: Intake/output summary:   Intake/Output Summary (Last 24 hours) at 12/14/2018 1545 Last data filed at 12/14/2018 1537 Gross per 24 hour  Intake 1050.54 ml  Output 450 ml  Net 600.54 ml    LBM: Last BM Date: (PTA) Baseline Weight: Weight: 61.2 kg Most recent weight: Weight: 55.7 kg     Palliative Assessment/Data:   Flowsheet Rows     Most Recent Value  Intake Tab  Referral Department  Hospitalist  Unit at Time of Referral  Med/Surg Unit  Palliative Care Primary Diagnosis  Trauma  Date Notified  12/14/18  Reason for referral  Clarify Goals of Care  Date of Admission  12/12/18  Date first seen by Palliative Care  12/14/18  # of days Palliative referral response time  0 Day(s)  #  of days IP prior to Palliative referral  2  Clinical Assessment  Palliative Performance Scale Score  40%  Pain Max last 24 hours  Not able to report  Pain Min Last 24 hours  Not able to report  Dyspnea Max Last 24 Hours  Not able to report  Dyspnea Min Last 24 hours  Not able to report  Psychosocial & Spiritual Assessment  Palliative Care Outcomes      Time In: 1530 Time Out: 1620 Time Total: 50 minutes  Greater than 50%  of this time was spent counseling and coordinating care related to the above assessment and plan.  Signed by: Drue Novel, NP   Please contact Palliative Medicine Team phone at [phone] for questions and concerns.   For individual provider: See Shea Evans

## 2018-12-14 NOTE — Progress Notes (Signed)
Secure chat sent to MD [facility] about patient not urinating after surgery and has 260 mL in bladder after being bladder scanned. She stated to bladder scan again in 4 hours and see if patient is still retaining

## 2018-12-14 NOTE — Progress Notes (Signed)
Subjective: 1 Day Post-Op Procedure(s) (LRB): INTRAMEDULLARY (IM) NAIL FEMORAL, RIGHT (Right) Patient reports pain as mild.   Patient is well but Hg 6.5 this morning, internal medicine has ordered blood transfusion PT and Care management to assist with discharge planning. Negative for chest pain and shortness of breath Fever: no Gastrointestinal:Negative for nausea and vomiting  Objective: Vital signs in last 24 hours: Temp:  [97.2 F (36.2 C)-100.1 F (37.8 C)] 97.9 F (36.6 C) (10/06 1201) Pulse Rate:  [71-108] 76 (10/06 1201) Resp:  [13-19] 16 (10/06 1133) BP: (95-154)/(43-79) 111/44 (10/06 1201) SpO2:  [93 %-100 %] 100 % (10/06 1201)  Intake/Output from previous day:  Intake/Output Summary (Last 24 hours) at 12/14/2018 1329 Last data filed at 12/14/2018 1002 Gross per 24 hour  Intake 980.13 ml  Output 450 ml  Net 530.13 ml    Intake/Output this shift: Total I/O In: 120 [P.O.:120] Out: -   Labs: Recent Labs    12/12/18 1038 12/13/18 0538 12/13/18 1135 12/13/18 1824 12/14/18 0444  HGB 8.7* 7.8* 8.2* 7.6* 6.5*   Recent Labs    12/13/18 0538  12/13/18 1824 12/14/18 0444  WBC 14.7*  --   --  12.8*  RBC 2.70*  --   --  2.23*  HCT 25.4*   < > 24.6* 20.6*  PLT 434*  --   --  336   < > = values in this interval not displayed.   Recent Labs    12/13/18 0538 12/14/18 0444  NA 139 137  K 4.1 3.5  CL 109 109  CO2 21* 21*  BUN 23 21  CREATININE 0.86 0.87  GLUCOSE 126* 120*  CALCIUM 8.4* 8.1*   No results for input(s): LABPT, INR in the last 72 hours.   EXAM General - Patient is alert but does seem confused this morning but is aware that she is in the hospital. Extremity - ABD soft Neurovascular intact Sensation intact distally Intact pulses distally Dorsiflexion/Plantar flexion intact Incision: dressing C/D/I No cellulitis present Dressing/Incision - clean, dry, no drainage Motor Function - intact, moving foot and toes well on exam.   Past  Medical History:  Diagnosis Date  . Anxiety 12/2002  . Arthritis    hands  . Cancer ([place])    breast left arm  . Cancer ([place])    uterine  . Diabetes mellitus 11/2004   Type 2  . Dyspnea    WITH EXERTION  . GERD (gastroesophageal reflux disease)   . Hyperlipidemia 11/2004  . Hypertension 12/2002  . Hypothyroidism   . Osteopenia 02/2002   02/2002 Dexa osteopenia// Dexa 02/26/2004 (Dr. Ubaldo Glassing) stable/IMR femur/spine  . Spinal stenosis   . 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 Ronnald Collum endocrinologist  . Vertigo     Assessment/Plan: 1 Day Post-Op Procedure(s) (LRB): INTRAMEDULLARY (IM) NAIL FEMORAL, RIGHT (Right) Active Problems:   Closed right hip fracture ([facility])  Estimated body mass index is 21.75 kg/m as calculated from the following:   Height as of this encounter: 5\' 3"  (1.6 m).   Weight as of this encounter: 55.7 kg. Advance diet Up with therapy D/C IV fluids when tolerating po intake.  Labs reviewed, Hg 6.5, transfusion ordered. Palliative care consult has been placed for the patient. Continue to work with PT at this time. Patient is passing gas, has not had a BM yet.  DVT Prophylaxis - Lovenox and Foot Pumps Weight-Bearing as tolerated to right leg  J. Cameron Proud, PA-C [facility]  Orthopaedic Surgery 12/14/2018, 1:29 PM

## 2018-12-14 NOTE — Progress Notes (Signed)
Hold second unit of blood per MD [facility]

## 2018-12-14 NOTE — Progress Notes (Signed)
Telephone consent for blood obtained from [facility].

## 2018-12-14 NOTE — Progress Notes (Signed)
PT Cancellation Note  Patient Details Name: SARETHA DRUCK MRN: [id number] DOB: 07/04/1926   Cancelled Treatment:    Reason Eval/Treat Not Completed: Patient not medically ready.  Per nursing, pt to start blood transfusion now.  Will hold until completed.  Roxanne Gates, PT, DPT  Roxanne Gates 12/14/2018, 11:21 AM

## 2018-12-14 NOTE — Evaluation (Signed)
Physical Therapy Evaluation Patient Details Name: [name] MRN: [id number] DOB: 07-17-1926 Today's Date: 12/14/2018   History of Present Illness  Pt is an 85 year old female s/p R IM nailing following R closed hip flexor a fall in her home.  PMH includes L IM nail, osteopenia, CA and spinal stenosis.  Clinical Impression  Pt is an 85 year old female who, per documentation, lives at [facility].  Pt disoriented during social hx and thinks that she still lives near her son.  Pt reported use of RW at baseline.   Pt expressed pain increase with all movement of R LE but was able to perform ankle pumps and, with assistance, hip abduction and SLR.  Pt able to follow directions for quad sets.  She attempted to roll side to side when PT assisted with positioning but reported pain and required Max A in the end.  Pt appeared to become more confused and with difficulty coordinating movement as evaluation progressed.  PT assisted with positioning for comfort and pt expressed gratitude.  Will continue to benefit from skilled PT with focus on strength, tolerance to activity, pain management and safe functional mobility.     Follow Up Recommendations SNF    Equipment Recommendations  Other (comment)(TBD at next venue of care.)    Recommendations for Other Services       Precautions / Restrictions Precautions Precautions: Fall Restrictions Weight Bearing Restrictions: Yes RLE Weight Bearing: Weight bearing as tolerated      Mobility  Bed Mobility Overal bed mobility: Needs Assistance Bed Mobility: Rolling Rolling: Mod assist         General bed mobility comments: Able to grip bed rail to assist in rolling, began talking about how sore her "bottom" was due to fall.  Pt becoming more confused at this time and unable to coordinate getting to bedside.  Transfers                    Ambulation/Gait                Stairs            Wheelchair Mobility     Modified Rankin (Stroke Patients Only)       Balance                                             Pertinent Vitals/Pain Pain Assessment: Faces Faces Pain Scale: Hurts little more Pain Location: R hip with mobility. Pain Descriptors / Indicators: Grimacing;Guarding;Crying;Moaning Pain Intervention(s): Limited activity within patient's tolerance;Monitored during session    Home Living Family/patient expects to be discharged to:: (P) Skilled nursing facility                      Prior Function                 Hand Dominance        Extremity/Trunk Assessment   Upper Extremity Assessment Upper Extremity Assessment: Generalized weakness    Lower Extremity Assessment Lower Extremity Assessment: Generalized weakness;RLE deficits/detail RLE Deficits / Details: Ankle PF/DF: 3+/5, pain ,limited; able to perform partial SLR with assistance. RLE: Unable to fully assess due to pain RLE Sensation: WNL       Communication      Cognition Arousal/Alertness: Awake/alert Behavior During Therapy: Restless Overall Cognitive Status: No family/caregiver  present to determine baseline cognitive functioning                                 General Comments: Pt alert and oriented to self and situation at beginning of session.  Appeared to become more confused at evaluation continued.      General Comments      Exercises Total Joint Exercises Ankle Circles/Pumps: 20 reps;Strengthening;Both;Supine Quad Sets: Strengthening;Right;10 reps;Supine Hip ABduction/ADduction: AAROM;Right;10 reps;Supine Straight Leg Raises: AAROM;5 reps;Right;Supine   Assessment/Plan    PT Assessment Patient needs continued PT services  PT Problem List Decreased strength;Decreased mobility;Decreased activity tolerance;Decreased balance;Decreased knowledge of use of DME;Pain;Decreased range of motion;Decreased coordination;Decreased cognition       PT  Treatment Interventions DME instruction;Therapeutic activities;Gait training;Therapeutic exercise;Patient/family education;Stair training;Balance training;Functional mobility training    PT Goals (Current goals can be found in the Care Plan section)  Acute Rehab PT Goals Patient Stated Goal: To return home to her son. PT Goal Formulation: With patient Time For Goal Achievement: 12/28/18 Potential to Achieve Goals: Fair    Frequency BID   Barriers to discharge        Co-evaluation               AM-PAC PT "6 Clicks" Mobility  Outcome Measure Help needed turning from your back to your side while in a flat bed without using bedrails?: A Lot Help needed moving from lying on your back to sitting on the side of a flat bed without using bedrails?: A Lot Help needed moving to and from a bed to a chair (including a wheelchair)?: A Lot Help needed standing up from a chair using your arms (e.g., wheelchair or bedside chair)?: A Lot Help needed to walk in hospital room?: Total Help needed climbing 3-5 steps with a railing? : Total 6 Click Score: 10    End of Session   Activity Tolerance: Patient limited by pain;Treatment limited secondary to agitation Patient left: in bed;with call bell/phone within reach;with bed alarm set   PT Visit Diagnosis: Unsteadiness on feet (R26.81);History of falling (Z91.81);Muscle weakness (generalized) (M62.81);Pain Pain - Right/Left: Right Pain - part of body: Hip    Time: 1340-1400 PT Time Calculation (min) (ACUTE ONLY): 20 min   Charges:   PT Evaluation $PT Eval Low Complexity: 1 Low PT Treatments $Therapeutic Exercise: 8-22 mins        Roxanne Gates, PT, DPT   Roxanne Gates 12/14/2018, 2:55 PM

## 2018-12-14 NOTE — TOC Initial Note (Signed)
Transition of Care [name]) - Initial/Assessment Note    Patient Details  Name: [name] MRN: [id number] Date of Birth: October 08, 1926  Transition of Care [name]) CM/SW Contact:    [name], [name] Phone Number: [phone]  12/14/2018, 1:53 PM  Clinical Narrative: Clinical Social Worker (CSW) met with patient alone at bedside to discuss D/C plan. Patient was alert and oriented X2 to self and place. Patient was not oriented to time and stated that the year is 2021. CSW introduced self and explained role of CSW department. Per patient she has been staying at [name] for rehab and was planning to go home with hospice after leaving [name]. Patient reported that she wants to go home and her son and her friend [name] will take care of her. CSW explained that patient is very weak right now and explained the risk of going home. Patient requested that CSW call her friend [name] while CSW was in the room. CSW contacted [name] and put her on speaker phone while CSW was in the room. Per [name] she is patient's friend and helps patient make decisions. CSW made [name] aware of above. [name] told patient and CSW that it will not be a good idea for patient to come home because she will not have 24/7 care at home. [name] is agreeable for patient to return to [name]. Patient stated that she will follow [name]'s advice and return to [name].  Patient reported that when the time comes for her to be set up with hospice she wants to use the same hospice agency her husband [name] used. CSW looked up her husband's chart in Epic and found that her husband used [name] is now [name]. Patient and [name] prefer [name] when patient completes rehab and goes home. Patient is agreeable to outpatient palliative to follow her at [name]. [name] is aware of above.   Per [name] admissions coordinator at [name] patient can return to [name] and  continue her short term rehab. Per [name] patient's [name] adult protective services ([name]) case has been closed. [name] discharge planner at [name] is aware of above and reported that patient needs only 1 negative covid test while in the hospital in order to return to [name]. FL2 complete. CSW will continue to follow and assist as needed.     Expected Discharge Plan: Skilled Nursing Facility Barriers to Discharge: Continued Medical Work up   Patient Goals and CMS Choice Patient states their goals for this hospitalization and ongoing recovery are:: To go back to rehab and get strong enough to go home.      Expected Discharge Plan and Services Expected Discharge Plan: [name] In-house Referral: Clinical Social Work     Living arrangements for the past 2 months: Single Family Home                 DME Arranged: N/A         HH Arranged: NA          Prior Living Arrangements/Services Living arrangements for the past 2 months: Single Family Home Lives with:: Self, Facility Resident Patient language and need for interpreter reviewed:: No Do you feel safe going back to the place where you live?: Yes      Need for Family Participation in Patient Care: Yes (Comment) Care giver support [name] in place?: No (comment)   Criminal Activity/Legal Involvement Pertinent to Current Situation/Hospitalization: No -  Comment as needed  Activities of Daily Living Home Assistive Devices/Equipment: Wheelchair ADL Screening (condition at time of admission) Patient's cognitive ability adequate to safely complete daily activities?: No Is the patient deaf or have difficulty hearing?: No Does the patient have difficulty seeing, even when wearing glasses/contacts?: No Does the patient have difficulty concentrating, remembering, or making decisions?: Yes Patient able to express need for assistance with ADLs?: No Does the patient have difficulty dressing or  bathing?: Yes Independently performs ADLs?: No Communication: Dependent Is this a change from baseline?: Pre-admission baseline Dressing (OT): Needs assistance Is this a change from baseline?: Pre-admission baseline Grooming: Needs assistance Is this a change from baseline?: Pre-admission baseline Feeding: Needs assistance Is this a change from baseline?: Pre-admission baseline Bathing: Needs assistance Is this a change from baseline?: Pre-admission baseline Toileting: Needs assistance Is this a change from baseline?: Pre-admission baseline In/Out Bed: Needs assistance Is this a change from baseline?: Pre-admission baseline Walks in Home: Needs assistance Is this a change from baseline?: Pre-admission baseline Does the patient have difficulty walking or climbing stairs?: Yes Weakness of Legs: Both Weakness of Arms/Hands: Both  Permission Sought/Granted Permission sought to share information with : Chartered certified accountant granted to share information with : Yes, Verbal Permission Granted              Emotional Assessment Appearance:: Appears stated age Attitude/Demeanor/Rapport: Engaged Affect (typically observed): Pleasant, Calm Orientation: : Oriented to Self, Oriented to Place, Fluctuating Orientation (Suspected and/or reported Sundowners) Alcohol / Substance Use: Not Applicable Psych Involvement: No (comment)  Admission diagnosis:  Closed nondisplaced intertrochanteric fracture of right femur, initial encounter [name]) [S72.144A] Patient Active Problem List   Diagnosis Date Noted  . Closed right hip fracture ([place]) 12/12/2018  . Comminuted fracture of left hip ([place]) 10/27/2018  . Osteoarthritis 03/12/2018  . Overactive bladder 03/12/2018  . Carotid artery stenosis 01/08/2018  . Vitamin D deficiency 01/08/2018  . Poor balance 10/16/2017  . Fall at home 10/15/2017  . Buttock pain 10/15/2017  . Contusion, buttock 10/15/2017  . Macular degeneration  03/20/2017  . Thickened nails 03/20/2017  . History of rectal bleeding 05/21/2016  . Eczema 05/16/2014  . Spinal stenosis of lumbar region 02/15/2014  . Right foot pain 09/23/2013  . Right shoulder pain 09/23/2013  . Other screening mammogram 04/12/2013  . Dizziness 05/21/2012  . Falls frequently 05/21/2012  . Urge incontinence 12/26/2011  . Hip pain, bilateral 09/15/2011  . Special screening for malignant neoplasms, colon 07/10/2010  . Hypothyroidism 04/30/2010  . CONSTIPATION 04/30/2010  . STRESS REACTION, ACUTE, WITH EMOTIONAL DISTURBANCE 10/09/2009  . Prediabetes 07/22/2009  . Malignant neoplasm of female breast ([place]) 12/29/2007  . Spinal stenosis, lumbar 06/22/2006  . PLUMMER'S DISEASE 06/16/2006  . Hyperlipidemia 06/16/2006  . ANXIETY 06/16/2006  . Essential hypertension 06/16/2006  . Osteopenia 06/16/2006   PCP:  McLean-Scocuzza, Nino Glow, MD Pharmacy:   [place], [address] [place] [address] Phone: [phone] Fax: [phone]     Social Determinants of Health (SDOH) Interventions    Readmission Risk Interventions No flowsheet data found.

## 2018-12-14 NOTE — Anesthesia Postprocedure Evaluation (Signed)
Anesthesia Post Note  Patient: [name]  Procedure(s) Performed: INTRAMEDULLARY (IM) NAIL FEMORAL, RIGHT (Right Hip)  Patient location during evaluation: Nursing Unit Anesthesia Type: Spinal Level of consciousness: responds to stimulation and patient cooperative Pain management: pain level controlled Vital Signs Assessment: post-procedure vital signs reviewed and stable Respiratory status: spontaneous breathing, nonlabored ventilation and respiratory function stable Cardiovascular status: blood pressure returned to baseline and stable Postop Assessment: no headache and no backache Anesthetic complications: no     Last Vitals:  Vitals:   12/13/18 2329 12/14/18 0347  BP: (!) 121/47 (!) 121/57  Pulse: 93 77  Resp: 19 18  Temp: 37.2 C 36.7 C  SpO2: 95% 94%    Last Pain:  Vitals:   12/14/18 0347  TempSrc: Oral  PainSc:                  Johnna Acosta

## 2018-12-14 NOTE — Progress Notes (Signed)
New referral for outpatient Palliative to follow at [facility] received from [facility]. Patient information given to referral. Flo Shanks BSN, RN, [place] [phone]

## 2018-12-14 NOTE — Progress Notes (Signed)
Hemoglobin 6.5 this morning. Dr. Marcille Blanco made aware. Placing new orders.

## 2018-12-15 ENCOUNTER — Ambulatory Visit: Payer: Medicare Other | Admitting: Internal Medicine

## 2018-12-15 DIAGNOSIS — S72141D Displaced intertrochanteric fracture of right femur, subsequent encounter for closed fracture with routine healing: Secondary | ICD-10-CM | POA: Diagnosis not present

## 2018-12-15 DIAGNOSIS — G9341 Metabolic encephalopathy: Secondary | ICD-10-CM | POA: Diagnosis not present

## 2018-12-15 DIAGNOSIS — Z466 Encounter for fitting and adjustment of urinary device: Secondary | ICD-10-CM | POA: Diagnosis not present

## 2018-12-15 DIAGNOSIS — I10 Essential (primary) hypertension: Secondary | ICD-10-CM | POA: Diagnosis not present

## 2018-12-15 DIAGNOSIS — E1140 Type 2 diabetes mellitus with diabetic neuropathy, unspecified: Secondary | ICD-10-CM | POA: Diagnosis not present

## 2018-12-15 DIAGNOSIS — D539 Nutritional anemia, unspecified: Secondary | ICD-10-CM | POA: Diagnosis not present

## 2018-12-15 DIAGNOSIS — M2550 Pain in unspecified joint: Secondary | ICD-10-CM | POA: Diagnosis not present

## 2018-12-15 DIAGNOSIS — E119 Type 2 diabetes mellitus without complications: Secondary | ICD-10-CM | POA: Diagnosis not present

## 2018-12-15 DIAGNOSIS — H3530 Unspecified macular degeneration: Secondary | ICD-10-CM | POA: Diagnosis not present

## 2018-12-15 DIAGNOSIS — F419 Anxiety disorder, unspecified: Secondary | ICD-10-CM | POA: Diagnosis not present

## 2018-12-15 DIAGNOSIS — M8580 Other specified disorders of bone density and structure, unspecified site: Secondary | ICD-10-CM | POA: Diagnosis not present

## 2018-12-15 DIAGNOSIS — Z7401 Bed confinement status: Secondary | ICD-10-CM | POA: Diagnosis not present

## 2018-12-15 DIAGNOSIS — Z7189 Other specified counseling: Secondary | ICD-10-CM | POA: Diagnosis not present

## 2018-12-15 DIAGNOSIS — L821 Other seborrheic keratosis: Secondary | ICD-10-CM | POA: Diagnosis not present

## 2018-12-15 DIAGNOSIS — Z23 Encounter for immunization: Secondary | ICD-10-CM | POA: Diagnosis not present

## 2018-12-15 DIAGNOSIS — W19XXXD Unspecified fall, subsequent encounter: Secondary | ICD-10-CM | POA: Diagnosis not present

## 2018-12-15 DIAGNOSIS — M25551 Pain in right hip: Secondary | ICD-10-CM | POA: Diagnosis not present

## 2018-12-15 DIAGNOSIS — M8589 Other specified disorders of bone density and structure, multiple sites: Secondary | ICD-10-CM | POA: Diagnosis not present

## 2018-12-15 DIAGNOSIS — M48061 Spinal stenosis, lumbar region without neurogenic claudication: Secondary | ICD-10-CM | POA: Diagnosis not present

## 2018-12-15 DIAGNOSIS — D72829 Elevated white blood cell count, unspecified: Secondary | ICD-10-CM | POA: Diagnosis not present

## 2018-12-15 DIAGNOSIS — M25552 Pain in left hip: Secondary | ICD-10-CM | POA: Diagnosis not present

## 2018-12-15 DIAGNOSIS — M1990 Unspecified osteoarthritis, unspecified site: Secondary | ICD-10-CM | POA: Diagnosis not present

## 2018-12-15 DIAGNOSIS — I480 Paroxysmal atrial fibrillation: Secondary | ICD-10-CM | POA: Diagnosis not present

## 2018-12-15 DIAGNOSIS — Z853 Personal history of malignant neoplasm of breast: Secondary | ICD-10-CM | POA: Diagnosis not present

## 2018-12-15 DIAGNOSIS — Y92129 Unspecified place in nursing home as the place of occurrence of the external cause: Secondary | ICD-10-CM | POA: Diagnosis not present

## 2018-12-15 DIAGNOSIS — E785 Hyperlipidemia, unspecified: Secondary | ICD-10-CM | POA: Diagnosis not present

## 2018-12-15 DIAGNOSIS — R404 Transient alteration of awareness: Secondary | ICD-10-CM | POA: Diagnosis not present

## 2018-12-15 DIAGNOSIS — L89620 Pressure ulcer of left heel, unstageable: Secondary | ICD-10-CM | POA: Diagnosis not present

## 2018-12-15 DIAGNOSIS — S72001A Fracture of unspecified part of neck of right femur, initial encounter for closed fracture: Secondary | ICD-10-CM | POA: Diagnosis not present

## 2018-12-15 DIAGNOSIS — R339 Retention of urine, unspecified: Secondary | ICD-10-CM | POA: Diagnosis not present

## 2018-12-15 DIAGNOSIS — W1830XA Fall on same level, unspecified, initial encounter: Secondary | ICD-10-CM | POA: Diagnosis not present

## 2018-12-15 DIAGNOSIS — D649 Anemia, unspecified: Secondary | ICD-10-CM | POA: Diagnosis not present

## 2018-12-15 DIAGNOSIS — L309 Dermatitis, unspecified: Secondary | ICD-10-CM | POA: Diagnosis not present

## 2018-12-15 DIAGNOSIS — Z9221 Personal history of antineoplastic chemotherapy: Secondary | ICD-10-CM | POA: Diagnosis not present

## 2018-12-15 DIAGNOSIS — S72144A Nondisplaced intertrochanteric fracture of right femur, initial encounter for closed fracture: Secondary | ICD-10-CM | POA: Diagnosis not present

## 2018-12-15 DIAGNOSIS — I252 Old myocardial infarction: Secondary | ICD-10-CM | POA: Diagnosis not present

## 2018-12-15 DIAGNOSIS — Z96643 Presence of artificial hip joint, bilateral: Secondary | ICD-10-CM | POA: Diagnosis not present

## 2018-12-15 DIAGNOSIS — D62 Acute posthemorrhagic anemia: Secondary | ICD-10-CM | POA: Diagnosis not present

## 2018-12-15 DIAGNOSIS — I6529 Occlusion and stenosis of unspecified carotid artery: Secondary | ICD-10-CM | POA: Diagnosis not present

## 2018-12-15 DIAGNOSIS — N139 Obstructive and reflux uropathy, unspecified: Secondary | ICD-10-CM | POA: Diagnosis not present

## 2018-12-15 DIAGNOSIS — L89154 Pressure ulcer of sacral region, stage 4: Secondary | ICD-10-CM | POA: Diagnosis not present

## 2018-12-15 DIAGNOSIS — E559 Vitamin D deficiency, unspecified: Secondary | ICD-10-CM | POA: Diagnosis not present

## 2018-12-15 DIAGNOSIS — S72142D Displaced intertrochanteric fracture of left femur, subsequent encounter for closed fracture with routine healing: Secondary | ICD-10-CM | POA: Diagnosis not present

## 2018-12-15 DIAGNOSIS — N3281 Overactive bladder: Secondary | ICD-10-CM | POA: Diagnosis not present

## 2018-12-15 DIAGNOSIS — E039 Hypothyroidism, unspecified: Secondary | ICD-10-CM | POA: Diagnosis not present

## 2018-12-15 DIAGNOSIS — Z515 Encounter for palliative care: Secondary | ICD-10-CM | POA: Diagnosis not present

## 2018-12-15 DIAGNOSIS — Z8542 Personal history of malignant neoplasm of other parts of uterus: Secondary | ICD-10-CM | POA: Diagnosis not present

## 2018-12-15 LAB — BASIC METABOLIC PANEL
Anion gap: 6 (ref 5–15)
BUN: 17 mg/dL (ref 8–23)
CO2: 22 mmol/L (ref 22–32)
Calcium: 8.3 mg/dL — ABNORMAL LOW (ref 8.9–10.3)
Chloride: 109 mmol/L (ref 98–111)
Creatinine, Ser: 0.66 mg/dL (ref 0.44–1.00)
GFR calc Af Amer: >60 mL/min (ref >60)
GFR calc non Af Amer: >60 mL/min (ref >60)
Glucose, Bld: 109 mg/dL — ABNORMAL HIGH (ref 70–99)
Potassium: 4 mmol/L (ref 3.5–5.1)
Sodium: 137 mmol/L (ref 135–145)

## 2018-12-15 LAB — CBC
HCT: 25.5 % — ABNORMAL LOW (ref 36.0–46.0)
Hemoglobin: 8.2 g/dL — ABNORMAL LOW (ref 12.0–15.0)
MCH: 28.5 pg (ref 26.0–34.0)
MCHC: 32.2 g/dL (ref 30.0–36.0)
MCV: 88.5 fL (ref 80.0–100.0)
Platelets: 350 10*3/uL (ref 150–400)
RBC: 2.88 MIL/uL — ABNORMAL LOW (ref 3.87–5.11)
RDW: 17.6 % — ABNORMAL HIGH (ref 11.5–15.5)
WBC: 13.2 10*3/uL — ABNORMAL HIGH (ref 4.0–10.5)
nRBC: 0 % (ref 0.0–0.2)

## 2018-12-15 MED ORDER — TRAMADOL HCL 50 MG PO TABS: 50.0000 mg | ORAL_TABLET | Freq: Four times a day (QID) | ORAL | 0 refills | Status: DC | PRN

## 2018-12-15 MED ORDER — INFLUENZA VAC A&B SA ADJ QUAD 0.5 ML IM PRSY
0.5000 mL | PREFILLED_SYRINGE | Freq: Once | INTRAMUSCULAR | Status: AC
  Administered 2018-12-15: 0.5 mL via INTRAMUSCULAR
  Filled 2018-12-15: qty 0.5

## 2018-12-15 MED ORDER — DOCUSATE SODIUM 100 MG PO CAPS: 100.0000 mg | ORAL_CAPSULE | Freq: Two times a day (BID) | ORAL | 0 refills | Status: DC | PRN

## 2018-12-15 MED ORDER — ENOXAPARIN SODIUM 40 MG/0.4ML ~~LOC~~ SOLN: 40.0000 mg | SUBCUTANEOUS | 0 refills | Status: AC

## 2018-12-15 MED ORDER — ACETAMINOPHEN 325 MG PO TABS: 650.0000 mg | ORAL_TABLET | Freq: Four times a day (QID) | ORAL | 0 refills | Status: DC | PRN

## 2018-12-15 MED ORDER — HYDROCORTISONE (PERIANAL) 2.5 % EX CREA
TOPICAL_CREAM | Freq: Three times a day (TID) | CUTANEOUS | Status: DC
  Filled 2018-12-15: qty 28.35

## 2018-12-15 MED ORDER — FERROUS SULFATE 325 (65 FE) MG PO TABS: 325.0000 mg | ORAL_TABLET | Freq: Every day | ORAL | 0 refills | Status: AC

## 2018-12-15 MED ORDER — CALCIUM CARBONATE 1250 (500 CA) MG PO TABS: 1.0000 | ORAL_TABLET | Freq: Every day | ORAL | 0 refills | Status: AC

## 2018-12-15 MED ORDER — HYDROCORTISONE ACETATE 25 MG RE SUPP
25.0000 mg | Freq: Two times a day (BID) | RECTAL | Status: DC | PRN
  Filled 2018-12-15: qty 1

## 2018-12-15 NOTE — Progress Notes (Signed)
Physical Therapy Treatment Patient Details Name: [name] MRN: [id number] DOB: [birth year] Today's Date: 12/15/2018    History of Present Illness Pt is an 85 year old female s/p R IM nailing following R closed hip flexor a fall in her home.  PMH includes L IM nail, osteopenia, CA and spinal stenosis.    PT Comments    Pt more alert today but with some confusion during conversation.  She was willing to try to get to EOB and required mod-max A to move R LE and for trunk control.  Pt able to complete most supine there ex with VC's and manual assistance with hip abd and quad sets.  She reported pain increase with mobility but appeared to better tolerate movement today.   Pt became mildly tachycardic with mobility and when sitting upright.  She was able to balance sitting at bedside with close supervision and wt shift to L side.  PT assisted pt in positioning for comfort following session.  Pt will continue to benefit from skilled a PT with focus on strength, pain management, tolerance to activity and safe functional mobility.  Follow Up Recommendations  SNF     Equipment Recommendations       Recommendations for Other Services       Precautions / Restrictions Precautions Precautions: Fall Restrictions Weight Bearing Restrictions: Yes RLE Weight Bearing: Weight bearing as tolerated    Mobility  Bed Mobility Overal bed mobility: Needs Assistance Bed Mobility: Supine to Sit Rolling: Mod assist   Supine to sit: Max assist     General bed mobility comments: Able to get to EOB today with assistance in bringing R LE over EOB and trunk upright.  PT assisted pt in returning to bed and scooting up with pt report in pain increase; slightly more tolerant to pain today than yesterday.  Transfers                    Ambulation/Gait                 Stairs             Wheelchair Mobility    Modified Rankin (Stroke Patients Only)       Balance                                             Cognition Arousal/Alertness: Awake/alert Behavior During Therapy: Restless Overall Cognitive Status: No family/caregiver present to determine baseline cognitive functioning                                 General Comments: Pt more alert today and able to follow directions.  Did perseverate on paperwork that needed to be signed that she thought should be on the counter in her room.      Exercises Total Joint Exercises Ankle Circles/Pumps: 20 reps;Strengthening;Both;Supine Quad Sets: Strengthening;Right;Supine;15 reps Towel Squeeze: Strengthening;Both;10 reps;Supine Short Arc Quad: 10 reps;Supine;Strengthening;Right Heel Slides: AAROM;Left;10 reps;Supine Hip ABduction/ADduction: AAROM;Right;10 reps;Supine Straight Leg Raises: AAROM;5 reps;Right;Supine Other Exercises Other Exercises: Monitored vitals with HR: 97-105 BPM during activity. x3 min Other Exercises: Assistance with postioning for comfort x4 min    General Comments        Pertinent Vitals/Pain Pain Assessment: Faces Faces Pain Scale: Hurts little more Pain Location: R hip with bed  mobility. Pain Descriptors / Indicators: Grimacing;Guarding Pain Intervention(s): Limited activity within patient's tolerance;Monitored during session    Home Living                      Prior Function            PT Goals (current goals can now be found in the care plan section) Acute Rehab PT Goals Patient Stated Goal: To return home to her son. PT Goal Formulation: With patient Time For Goal Achievement: 12/28/18 Potential to Achieve Goals: Fair Progress towards PT goals: Progressing toward goals    Frequency    BID      PT Plan Current plan remains appropriate    Co-evaluation              AM-PAC PT "6 Clicks" Mobility   Outcome Measure  Help needed turning from your back to your side while in a flat bed without using bedrails?: A  Lot Help needed moving from lying on your back to sitting on the side of a flat bed without using bedrails?: A Lot Help needed moving to and from a bed to a chair (including a wheelchair)?: A Lot Help needed standing up from a chair using your arms (e.g., wheelchair or bedside chair)?: A Lot Help needed to walk in hospital room?: A Lot Help needed climbing 3-5 steps with a railing? : Total 6 Click Score: 11    End of Session   Activity Tolerance: Patient limited by pain Patient left: in bed;with call bell/phone within reach;with bed alarm set;with SCD's reapplied   PT Visit Diagnosis: Unsteadiness on feet (R26.81);Muscle weakness (generalized) (M62.81);History of falling (Z91.81);Pain Pain - Right/Left: Right Pain - part of body: Hip     Time: [id number] PT Time Calculation (min) (ACUTE ONLY): 23 min  Charges:  $Therapeutic Exercise: 8-22 mins $Therapeutic Activity: 8-22 mins                     Roxanne Gates, PT, DPT    Roxanne Gates 12/15/2018, 1:18 PM

## 2018-12-15 NOTE — Care Management Important Message (Signed)
Important Message  Patient Details  Name: [name] MRN: [id number] Date of Birth: 03-09-1927   Medicare Important Message Given:  Yes     Juliann Pulse A Kiarra Kidd 12/15/2018, 10:55 AM

## 2018-12-15 NOTE — Discharge Summary (Signed)
[facility] [place] NAME: [name]    MR#:  [id number]  DATE OF BIRTH:  March 27, 1926  DATE OF ADMISSION:  12/12/2018   ADMITTING PHYSICIAN: Sela Hua, MD  DATE OF DISCHARGE: 12/15/18  PRIMARY CARE PHYSICIAN: McLean-Scocuzza, Nino Glow, MD   ADMISSION DIAGNOSIS:  Closed nondisplaced intertrochanteric fracture of right femur, initial encounter ([place]) [S72.144A] DISCHARGE DIAGNOSIS:  Active Problems:   Closed right hip fracture (HCC)   Closed nondisplaced intertrochanteric fracture of right femur (HCC)   Goals of care, counseling/discussion   Palliative care by specialist   DNR (do not resuscitate) discussion  SECONDARY DIAGNOSIS:   Past Medical History:  Diagnosis Date  . Anxiety 12/2002  . Arthritis    hands  . Cancer ([place])    breast left arm  . Cancer ([place])    uterine  . Diabetes mellitus 11/2004   Type 2  . Dyspnea    WITH EXERTION  . GERD (gastroesophageal reflux disease)   . Hyperlipidemia 11/2004  . Hypertension 12/2002  . Hypothyroidism   . Osteopenia 02/2002   02/2002 Dexa osteopenia// Dexa 02/26/2004 (Dr. Ubaldo Glassing) stable/IMR femur/spine  . Spinal stenosis   . 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 Ronnald Collum endocrinologist  . Vertigo    HOSPITAL COURSE:   Ellory is a 85 year old female who presented to the ED with right hip pain after an unwitnessed fall. In the ED, right hip x-ray showed an acute intertrochanteric proximal right femur fracture. She was admitted for further management.  Right hip fracture in the setting of unwitnessed fall-patient just recently broke her left hip in August of this year. -CT head and C-spine were unremarkable -s/p repair on 10/5 -Discharged on tylenol prn for mild pain and tramadol prn for moderate to severe pain -Patient will need to take lovenox 40mg  subcutaneous for 14 days -Follow-up with the orthopedic surgeon in 2 week -[facility]  and palliative care to follow on discharge -PT recommended SNF  Acute blood loss anemia with a history of iron deficiency anemia- due to surgery. Hemoglobin has been stable s/p 1 unit PRBC -Anemia panel with low iron and saturation ratio -Given a dose of IV iron 10/5 -Discharged on ferrous sulfate 325mg  daily  Acute metabolic encephalopathy- patient does have some underlying dementia.  Altered mental status has improved. -Monitor closely  Leukocytosis-likely reactive in the setting of hip fracture. No signs of infection. WBCs continue to improve. -Recheck CBC as an outpatient  Hypertension-normotensive -Continued home losartan  Prediabetes-most recent A1c was 5.7%. -Monitor as an outpatient  Hypothyroidism-stable -Continued home Synthroid  Stage IV sacral pressure ulcer- no signs of infection. Present on admission. -Seen by wound care RN who recommended the following: Cleanse sacral wound with NS and pat dry. NS moist gauze to wound bed and dry dressing.  DISCHARGE CONDITIONS:  Right hip fracture Recent left hip fracture Acute blood loss anemia Iron deficiency anemia Leukocytosis Hypertension Prediabetes Hypothyroidism CONSULTS OBTAINED:  Treatment Team:  Corky Mull, MD DRUG ALLERGIES:   Allergies  Allergen Reactions  . Amlodipine Other (See Comments)    Leg edema   . Atenolol Other (See Comments)    REACTION: bradycardia  . Erythromycin Base Other (See Comments)    REACTION: stomach upset  . Lisinopril Other (See Comments)    REACTION: cough  (20 mg)   DISCHARGE MEDICATIONS:   Allergies as of 12/15/2018      Reactions  Amlodipine Other (See Comments)   Leg edema    Atenolol Other (See Comments)   REACTION: bradycardia   Erythromycin Base Other (See Comments)   REACTION: stomach upset   Lisinopril Other (See Comments)   REACTION: cough  (20 mg)      Medication List    STOP taking these medications   HYDROcodone-acetaminophen 5-325 MG  tablet Commonly known as: NORCO/VICODIN     TAKE these medications   acetaminophen 325 MG tablet Commonly known as: TYLENOL Take 2 tablets (650 mg total) by mouth every 6 (six) hours as needed for mild pain.   aspirin 81 MG tablet Take 81 mg by mouth daily. Daily with food   calcium carbonate 1250 (500 Ca) MG tablet Commonly known as: OS-CAL - dosed in mg of elemental calcium Take 1 tablet (500 mg of elemental calcium total) by mouth daily with breakfast. Start taking on: December 16, 2018   Cholecalciferol 1.25 MG (50000 UT) capsule Take 1 capsule (50,000 Units total) by mouth once a week.   docusate sodium 100 MG capsule Commonly known as: COLACE Take 1 capsule (100 mg total) by mouth 2 (two) times daily as needed for mild constipation.   enoxaparin 40 MG/0.4ML injection Commonly known as: LOVENOX Inject 0.4 mLs (40 mg total) into the skin daily for 14 days. Start taking on: December 16, 2018   ferrous sulfate 325 (65 FE) MG tablet Take 1 tablet (325 mg total) by mouth daily.   gabapentin 100 MG capsule Commonly known as: NEURONTIN TAKE ONE (1) CAPSULE BY MOUTH 2 TIMES DAILY   levothyroxine 50 MCG tablet Commonly known as: SYNTHROID Take 1 tablet (50 mcg total) by mouth daily.   losartan 100 MG tablet Commonly known as: COZAAR Take 1 tablet (100 mg total) by mouth daily.   mometasone 0.1 % cream Commonly known as: Elocon Apply 1 application topically daily. To affected areas behind ears   polyethylene glycol powder 17 GM/SCOOP powder Commonly known as: GLYCOLAX/MIRALAX Take 17 g by mouth daily as needed. As directed for constipation   traMADol 50 MG tablet Commonly known as: ULTRAM Take 1 tablet (50 mg total) by mouth every 6 (six) hours as needed for moderate pain.   vitamin C 500 MG tablet Commonly known as: ASCORBIC ACID Take 500 mg by mouth daily.        DISCHARGE INSTRUCTIONS:  1. F/u with PCP in 5 days 2. F/u with orthopedic surgery in 2 weeks 3.  Take lovenox 40mg  subcutaneous daily for 14 days 4. Take ferrous sulfate 325mg  daily DIET:  Cardiac diet DISCHARGE CONDITION:  Stable ACTIVITY:  WBAT to the right lower extremity OXYGEN:  Home Oxygen: No.  Oxygen Delivery: room air DISCHARGE LOCATION:  nursing home   If you experience worsening of your admission symptoms, develop shortness of breath, life threatening emergency, suicidal or homicidal thoughts you must seek medical attention immediately by calling 911 or calling your MD immediately  if symptoms less severe.  You Must read complete instructions/literature along with all the possible adverse reactions/side effects for all the Medicines you take and that have been prescribed to you. Take any new Medicines after you have completely understood and accpet all the possible adverse reactions/side effects.   Please note  You were cared for by a hospitalist during your hospital stay. If you have any questions about your discharge medications or the care you received while you were in the hospital after you are discharged, you can call the unit and  asked to speak with the hospitalist on call if the hospitalist that took care of you is not available. Once you are discharged, your primary care physician will handle any further medical issues. Please note that NO REFILLS for any discharge medications will be authorized once you are discharged, as it is imperative that you return to your primary care physician (or establish a relationship with a primary care physician if you do not have one) for your aftercare needs so that they can reassess your need for medications and monitor your lab values.    On the day of Discharge:  VITAL SIGNS:  Blood pressure (!) 150/57, pulse 72, temperature 98.7 F (37.1 C), temperature source Oral, resp. rate 16, height 5\' 3"  (1.6 m), weight 55.7 kg, SpO2 96 %. PHYSICAL EXAMINATION:  GENERAL:  86 y.o.-year-old patient lying in the bed with no acute distress.   EYES: Pupils equal, round, reactive to light and accommodation. No scleral icterus. Extraocular muscles intact.  HEENT: Head atraumatic, normocephalic. Oropharynx and nasopharynx clear.  NECK:  Supple, no jugular venous distention. No thyroid enlargement, no tenderness.  LUNGS: Normal breath sounds bilaterally, no wheezing, rales,rhonchi or crepitation. No use of accessory muscles of respiration.  CARDIOVASCULAR: S1, S2 normal. No murmurs, rubs, or gallops.  ABDOMEN: Soft, non-tender, non-distended. Bowel sounds present. No organomegaly or mass.  EXTREMITIES: No pedal edema, cyanosis, or clubbing. +dry dressing in place over the right lateral hip. NEUROLOGIC: Cranial nerves II through XII are intact. +global weakness. Sensation intact. Gait not checked.  PSYCHIATRIC: The patient is alert and oriented x 2.  SKIN: No obvious rash, lesion, or ulcer.  DATA REVIEW:   CBC Recent Labs  Lab 12/15/18 0404  WBC 13.2*  HGB 8.2*  HCT 25.5*  PLT 350    Chemistries  Recent Labs  Lab 12/15/18 0404  NA 137  K 4.0  CL 109  CO2 22  GLUCOSE 109*  BUN 17  CREATININE 0.66  CALCIUM 8.3*     Microbiology Results  Results for orders placed or performed during the hospital encounter of 12/12/18  SARS Coronavirus 2 [facility] order, Performed in [facility] hospital lab) Nasopharyngeal Nasopharyngeal Swab     Status: None   Collection Time: 12/12/18 11:44 AM   Specimen: Nasopharyngeal Swab  Result Value Ref Range Status   SARS Coronavirus 2 NEGATIVE NEGATIVE Final    Comment: (NOTE) If result is NEGATIVE SARS-CoV-2 target nucleic acids are NOT DETECTED. The SARS-CoV-2 RNA is generally detectable in upper and lower  respiratory specimens during the acute phase of infection. The lowest  concentration of SARS-CoV-2 viral copies this assay can detect is 250  copies / mL. A negative result does not preclude SARS-CoV-2 infection  and should not be used as the sole basis for treatment or other   patient management decisions.  A negative result may occur with  improper specimen collection / handling, submission of specimen other  than nasopharyngeal swab, presence of viral mutation(s) within the  areas targeted by this assay, and inadequate number of viral copies  (<250 copies / mL). A negative result must be combined with clinical  observations, patient history, and epidemiological information. If result is POSITIVE SARS-CoV-2 target nucleic acids are DETECTED. The SARS-CoV-2 RNA is generally detectable in upper and lower  respiratory specimens dur ing the acute phase of infection.  Positive  results are indicative of active infection with SARS-CoV-2.  Clinical  correlation with patient history and other diagnostic information is  necessary to determine  patient infection status.  Positive results do  not rule out bacterial infection or co-infection with other viruses. If result is PRESUMPTIVE POSTIVE SARS-CoV-2 nucleic acids MAY BE PRESENT.   A presumptive positive result was obtained on the submitted specimen  and confirmed on repeat testing.  While 2019 novel coronavirus  (SARS-CoV-2) nucleic acids may be present in the submitted sample  additional confirmatory testing may be necessary for epidemiological  and / or clinical management purposes  to differentiate between  SARS-CoV-2 and other Sarbecovirus currently known to infect humans.  If clinically indicated additional testing with an alternate test  methodology [phone]) is advised. The SARS-CoV-2 RNA is generally  detectable in upper and lower respiratory sp ecimens during the acute  phase of infection. The expected result is Negative. Fact Sheet for Patients:  StrictlyIdeas.no Fact Sheet for Healthcare Providers: BankingDealers.co.za This test is not yet approved or cleared by the [place] FDA and has been authorized for detection and/or diagnosis of SARS-CoV-2 by  FDA under an Emergency Use Authorization (EUA).  This EUA will remain in effect (meaning this test can be used) for the duration of the COVID-19 declaration under Section 564(b)(1) of the Act, 21 U.S.C. section 360bbb-3(b)(1), unless the authorization is terminated or revoked sooner. Performed at [facility], [address], [place], [place] [address]   Surgical pcr screen     Status: None   Collection Time: 12/12/18  7:13 PM   Specimen: Nasal Mucosa; Nasal Swab  Result Value Ref Range Status   MRSA, PCR NEGATIVE NEGATIVE Final   Staphylococcus aureus NEGATIVE NEGATIVE Final    Comment: (NOTE) The Xpert SA Assay (FDA approved for NASAL specimens in patients 11 years of age and older), is one component of a comprehensive surveillance program. It is not intended to diagnose infection nor to guide or monitor treatment. Performed at [facility], [address] [place], [place] [address]     RADIOLOGY:  No results found.   Management plans discussed with the patient, family and they are in agreement.  CODE STATUS: DNR   TOTAL TIME TAKING CARE OF THIS PATIENT: 45 minutes.    Berna Spare Mayo M.D on 12/15/2018 at 10:19 AM  Between 7am to 6pm - Pager - [phone]  After 6pm go to [email or web address] - Proofreader  [facility]  Office  [phone]  CC: Primary care physician; McLean-Scocuzza, Nino Glow, MD   Note: This dictation was prepared with Dragon dictation along with smaller phrase technology. Any transcriptional errors that result from this process are unintentional.

## 2018-12-15 NOTE — Progress Notes (Signed)
  Subjective: 2 Days Post-Op Procedure(s) (LRB): INTRAMEDULLARY (IM) NAIL FEMORAL, RIGHT (Right) Patient reports pain as mild.   Patient is well, Hg 8.2, s/p transfusion yesterday. Plan is for d/c to rehab then transition to home hospice care. Negative for chest pain and shortness of breath Fever: no Gastrointestinal:Negative for nausea and vomiting  Objective: Vital signs in last 24 hours: Temp:  [97.8 F (36.6 C)-98.7 F (37.1 C)] 98.7 F (37.1 C) (10/06 2328) Pulse Rate:  [73-79] 79 (10/06 2328) Resp:  [16-19] 19 (10/06 2328) BP: (111-151)/(44-56) 151/54 (10/06 2328) SpO2:  [95 %-100 %] 95 % (10/06 2328)  Intake/Output from previous day:  Intake/Output Summary (Last 24 hours) at 12/15/2018 0759 Last data filed at 12/15/2018 0529 Gross per 24 hour  Intake 590.41 ml  Output 350 ml  Net 240.41 ml    Intake/Output this shift: No intake/output data recorded.  Labs: Recent Labs    12/13/18 1135 12/13/18 1824 12/14/18 0444 12/14/18 1514 12/15/18 0404  HGB 8.2* 7.6* 6.5* 8.4* 8.2*   Recent Labs    12/14/18 0444 12/14/18 1514 12/15/18 0404  WBC 12.8*  --  13.2*  RBC 2.23*  --  2.88*  HCT 20.6* 26.2* 25.5*  PLT 336  --  350   Recent Labs    12/14/18 0444 12/15/18 0404  NA 137 137  K 3.5 4.0  CL 109 109  CO2 21* 22  BUN 21 17  CREATININE 0.87 0.66  GLUCOSE 120* 109*  CALCIUM 8.1* 8.3*   No results for input(s): LABPT, INR in the last 72 hours.   EXAM General - Patient is alert, less confused this morning. Extremity - ABD soft Neurovascular intact Sensation intact distally Intact pulses distally Dorsiflexion/Plantar flexion intact Incision: dressing C/D/I No cellulitis present Dressing/Incision - clean, dry, no drainage Motor Function - intact, moving foot and toes well on exam.   Past Medical History:  Diagnosis Date  . Anxiety 12/2002  . Arthritis    hands  . Cancer ([place])    breast left arm  . Cancer ([place])    uterine  . Diabetes  mellitus 11/2004   Type 2  . Dyspnea    WITH EXERTION  . GERD (gastroesophageal reflux disease)   . Hyperlipidemia 11/2004  . Hypertension 12/2002  . Hypothyroidism   . Osteopenia 02/2002   02/2002 Dexa osteopenia// Dexa 02/26/2004 (Dr. Ubaldo Glassing) stable/IMR femur/spine  . Spinal stenosis   . 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 Ronnald Collum endocrinologist  . Vertigo     Assessment/Plan: 2 Days Post-Op Procedure(s) (LRB): INTRAMEDULLARY (IM) NAIL FEMORAL, RIGHT (Right) Active Problems:   Closed right hip fracture (HCC)   Closed nondisplaced intertrochanteric fracture of right femur ([place])   Goals of care, counseling/discussion   Palliative care by specialist   DNR (do not resuscitate) discussion  Estimated body mass index is 21.75 kg/m as calculated from the following:   Height as of this encounter: 5\' 3"  (1.6 m).   Weight as of this encounter: 55.7 kg. Advance diet Up with therapy  Labs reviewed, Hg 8.2 Palliative care consult has been placed for the patient. Continue to work with PT at this time. Patient is passing gas, has not had a BM yet.  DVT Prophylaxis - Lovenox and Foot Pumps Weight-Bearing as tolerated to right leg  J. Cameron Proud, PA-C [facility] 12/15/2018, 7:59 AM

## 2018-12-15 NOTE — Progress Notes (Signed)
EMS arrived to transport patient to [facility].  No s/s of distress noted. Packet and belongings with patient.

## 2018-12-15 NOTE — Progress Notes (Signed)
Report called to [facility] as pt will be returning back today.  All personal belongings with patient.  Patient had BM this AM. No distress noted.  AVS in packet for facility.

## 2018-12-15 NOTE — Progress Notes (Signed)
Non emergent transport  Notified that patient will need services to [facility].

## 2018-12-15 NOTE — Progress Notes (Signed)
Palliative: [name] is lying in bed with nursing staff at bedside attending to her personal needs.  She is alert and oriented, calm and cooperative.  She is able to make her needs known.  There is no family at bedside at this time.  She tells me that she feels that she slept okay last night, and denies pain at this time.  We talked about her bowel regimen, hemorrhoids (orders made).  We talked about her discharge plan.  All questions answered.  Outpatient palliative to follow.  Conference with bedside nursing staff related to patient condition, needs, disposition.  Plan: Return to [facility] for continued rehab/outpatient palliative with the goal of returning to her own home with hospice care. DNR/[place]enrod form completed  35 minutes Quinn Axe, NP Palliative Medicine Team Team Phone # [phone] Greater than 50% of this time was spent counseling and coordinating care related to the above assessment and plan.

## 2018-12-15 NOTE — Consult Note (Addendum)
[facility] Nurse wound consult note Reason for Consult:Stage 4 pressure injury to sacrococcygeal area. Present on admission.  Patient states is tender and has been there awhile. Patient states her appetite is good. She is turning as she can.  Anticipates discharge today to rehab Wound type: Stage 4 pressure injury Pressure Injury POA: Yes Measurement: 4 cm x 3.3 cm x 1.5 cm  Wound [id number] pink nongranulating, moist Drainage (amount, consistency, odor) minimal serosanguinous  No odor.  Periwound:intact Dressing procedure/placement/frequency:Cleanse sacral wound with NS and pat dry. NS moist gauze to wound bed and dry dressing,  Will not follow at this time.  Please re-consult if needed.  Domenic Moras MSN, RN, FNP-BC CWON Wound, Ostomy, Continence Nurse Pager [phone]

## 2018-12-15 NOTE — TOC Transition Note (Signed)
Transition of Care [facility]) - CM/SW Discharge Note   Patient Details  Name: [name] MRN: [id number] Date of Birth: Aug 15, 1926  [facility] CM/SW Contact:  Verdell Dykman, Lenice Llamas Phone Number: [phone]  12/15/2018, 11:54 AM   Clinical Narrative: Patient is medically stable for D/C back to [facility] today. Patient will have outpatient palliative care to follow. [facility] liaison is aware of above. Patient had a negative covid test on 10/4. Per [facility] admissions coordinator at [facility] patient can return today to room 504. RN will call report and arrange EMS for transport. Clinical Education officer, museum (CSW) sent D/C orders to [facility] via [place]. Patient is aware of above. CSW contacted patient's friend [name] and made her aware of above. Please reconsult if future social work needs arise. CSW signing off.       Final next level of care: Skilled Nursing Facility Barriers to Discharge: Barriers Resolved   Patient Goals and CMS Choice Patient states their goals for this hospitalization and ongoing recovery are:: To go back to rehab and get strong enough to go home.      Discharge Placement   Existing PASRR number confirmed : 12/14/18          Patient chooses bed at: [facility] Patient to be transferred to facility by: [facility] EMS Name of family member notified: Patient's friend [name] is aware of D/C today. Patient and family notified of of transfer: 12/15/18  Discharge Plan and Services In-house Referral: Clinical Social Work              DME Arranged: N/A         HH Arranged: NA          Social Determinants of Health (SDOH) Interventions     Readmission Risk Interventions No flowsheet data found.

## 2018-12-15 NOTE — Progress Notes (Signed)
Dressing to sacrum done after patient stooled. Wound appears clean, no drainage on dressing removed. Wound edges pink.  Dressing reapplied and covered with pink foam.

## 2018-12-15 NOTE — Progress Notes (Signed)
Patient turned and repositioned. Foam foot cushions to bilateral feet.  With feet elevated.

## 2018-12-16 DIAGNOSIS — I480 Paroxysmal atrial fibrillation: Secondary | ICD-10-CM | POA: Diagnosis not present

## 2018-12-16 DIAGNOSIS — S72141D Displaced intertrochanteric fracture of right femur, subsequent encounter for closed fracture with routine healing: Secondary | ICD-10-CM | POA: Diagnosis not present

## 2018-12-16 DIAGNOSIS — I10 Essential (primary) hypertension: Secondary | ICD-10-CM | POA: Diagnosis not present

## 2018-12-17 LAB — TYPE AND SCREEN
ABO/RH(D): O NEG
Antibody Screen: NEGATIVE
Unit division: 0
Unit division: 0

## 2018-12-17 LAB — BPAM RBC
Blood Product Expiration Date: 202010172359
Blood Product Expiration Date: 202011072359
ISSUE DATE / TIME: 202010020940
ISSUE DATE / TIME: 202010061125
Unit Type and Rh: 5100
Unit Type and Rh: 9500

## 2018-12-24 ENCOUNTER — Ambulatory Visit: Payer: Medicare Other | Admitting: Podiatry

## 2018-12-29 ENCOUNTER — Other Ambulatory Visit: Payer: Self-pay

## 2018-12-29 ENCOUNTER — Encounter: Payer: No Typology Code available for payment source | Attending: Internal Medicine | Admitting: Internal Medicine

## 2018-12-29 DIAGNOSIS — M8580 Other specified disorders of bone density and structure, unspecified site: Secondary | ICD-10-CM | POA: Diagnosis not present

## 2018-12-29 DIAGNOSIS — E785 Hyperlipidemia, unspecified: Secondary | ICD-10-CM | POA: Insufficient documentation

## 2018-12-29 DIAGNOSIS — E039 Hypothyroidism, unspecified: Secondary | ICD-10-CM | POA: Insufficient documentation

## 2018-12-29 DIAGNOSIS — E119 Type 2 diabetes mellitus without complications: Secondary | ICD-10-CM | POA: Diagnosis not present

## 2018-12-29 DIAGNOSIS — L89154 Pressure ulcer of sacral region, stage 4: Secondary | ICD-10-CM | POA: Diagnosis not present

## 2018-12-29 DIAGNOSIS — I252 Old myocardial infarction: Secondary | ICD-10-CM | POA: Diagnosis not present

## 2018-12-29 DIAGNOSIS — M48061 Spinal stenosis, lumbar region without neurogenic claudication: Secondary | ICD-10-CM | POA: Diagnosis not present

## 2018-12-29 DIAGNOSIS — L89620 Pressure ulcer of left heel, unstageable: Secondary | ICD-10-CM | POA: Diagnosis not present

## 2018-12-29 DIAGNOSIS — L821 Other seborrheic keratosis: Secondary | ICD-10-CM | POA: Diagnosis not present

## 2018-12-29 DIAGNOSIS — Z96643 Presence of artificial hip joint, bilateral: Secondary | ICD-10-CM | POA: Diagnosis not present

## 2018-12-29 DIAGNOSIS — Z8542 Personal history of malignant neoplasm of other parts of uterus: Secondary | ICD-10-CM | POA: Insufficient documentation

## 2018-12-29 DIAGNOSIS — Z853 Personal history of malignant neoplasm of breast: Secondary | ICD-10-CM | POA: Diagnosis not present

## 2018-12-29 DIAGNOSIS — Z9221 Personal history of antineoplastic chemotherapy: Secondary | ICD-10-CM | POA: Insufficient documentation

## 2018-12-29 DIAGNOSIS — M1990 Unspecified osteoarthritis, unspecified site: Secondary | ICD-10-CM | POA: Insufficient documentation

## 2018-12-29 DIAGNOSIS — I10 Essential (primary) hypertension: Secondary | ICD-10-CM | POA: Diagnosis not present

## 2018-12-29 NOTE — Progress Notes (Signed)
Stacy, [name] ([id number]) Visit Report for 12/29/2018 Abuse/Suicide Risk Screen Details Patient Name: [name], [name]. Date of Service: 12/29/2018 1:15 PM Medical Record Number: [id number] Patient Account Number: [id number] Date of Birth/Sex: [birth year] (85 y.o. F) Treating RN: Montey Hora Primary Care Mistee Soliman: Myrtie Hawk Other Clinician: Referring Jamis Kryder: Myrtie Hawk Treating [place]/Extender: Tito Dine in Treatment: 0 Abuse/Suicide Risk Screen Items Answer ABUSE RISK SCREEN: Has anyone close to you tried to hurt or harm you recentlyo No Do you feel uncomfortable with anyone in your familyo No Has anyone forced you do things that you didnot want to doo No Electronic Signature(s) Signed: 12/29/2018 4:26:39 PM By: Montey Hora Entered By: Montey Hora on 12/29/2018 13:39:32 Hoyos, Dollie B. ([id number]) -------------------------------------------------------------------------------- Activities of Daily Living Details Patient Name: Stacy Reeds B. Date of Service: 12/29/2018 1:15 PM Medical Record Number: [id number] Patient Account Number: [id number] Date of Birth/Sex: [birth year] (85 y.o. F) Treating RN: Montey Hora Primary Care Diedra Sinor: Myrtie Hawk Other Clinician: Referring Myrtha Tonkovich: Myrtie Hawk Treating Aliyanna Wassmer/Extender: Tito Dine in Treatment: 0 Activities of Daily Living Items Answer Activities of Daily Living (Please select one for each item) Drive Automobile Not Able Take Medications Need Assistance Use Telephone Need Assistance Care for Appearance Need Assistance Use Toilet Need Assistance Bath / Shower Need Assistance Dress Self Need Assistance Feed Self Completely Able Walk Not Able Get In / Out Bed Need Assistance Housework Not Able Prepare Meals Not Able Handle Money Not Able Shop for Self Not Able Electronic Signature(s) Signed: 12/29/2018 4:26:39 PM By: Montey Hora Entered By: Montey Hora on 12/29/2018 13:40:03 Bezdek, [name] ([id number]) -------------------------------------------------------------------------------- Education Screening Details Patient Name: Stacy Reeds B. Date of Service: 12/29/2018 1:15 PM Medical Record Number: [id number] Patient Account Number: [id number] Date of Birth/Sex: 07/06/1926 (85 y.o. F) Treating RN: Montey Hora Primary Care Marykathryn Carboni: Myrtie Hawk Other Clinician: Referring Felicha Frayne: Myrtie Hawk Treating Eiliyah Reh/Extender: Tito Dine in Treatment: 0 Primary Learner Assessed: Patient Learning Preferences/Education Level/Primary Language Learning Preference: Explanation, Demonstration Highest Education Level: High School Preferred Language: English Cognitive Barrier Language Barrier: No Translator Needed: No Memory Deficit: No Emotional Barrier: No Cultural/Religious Beliefs Affecting Medical Care: No Physical Barrier Impaired Vision: Yes macular degeneration Impaired Hearing: No Decreased Hand dexterity: No Knowledge/Comprehension Knowledge Level: Low Comprehension Level: Low Ability to understand written Low instructions: Ability to understand verbal Low instructions: Motivation Anxiety Level: Calm Cooperation: Cooperative Education Importance: Acknowledges Need Interest in Health Problems: Asks Questions Perception: Confused Willingness to Engage in Self- Medium Management Activities: Readiness to Engage in Self- Medium Management Activities: Electronic Signature(s) Signed: 12/29/2018 4:26:39 PM By: Montey Hora Entered By: Montey Hora on 12/29/2018 13:40:46 Quiocho, [name] ([id number]) -------------------------------------------------------------------------------- Fall Risk Assessment Details Patient Name: Stacy Reeds B. Date of Service: 12/29/2018 1:15 PM Medical Record Number: [id number] Patient Account Number: [id number] Date of  Birth/Sex: [birth year] (85 y.o. F) Treating RN: Montey Hora Primary Care Leontae Bostock: Myrtie Hawk Other Clinician: Referring Arlie Posch: Myrtie Hawk Treating Jesseca Marsch/Extender: Tito Dine in Treatment: 0 Fall Risk Assessment Items Have you had 2 or more falls in the last 12 monthso 0 Yes Have you had any fall that resulted in injury in the last 12 monthso 0 Yes FALLS RISK SCREEN History of falling - immediate or within 3 months 25 Yes Secondary diagnosis (Do you have 2 or more medical diagnoseso) 0 No Ambulatory aid None/bed rest/wheelchair/nurse 0 No Crutches/cane/walker 0 No Furniture 0 No Intravenous therapy Access/Saline/Heparin Lock 0 No Gait/Transferring Normal/ bed rest/ wheelchair 0  Yes Weak (short steps with or without shuffle, stooped but able to lift head while 0 No walking, may seek support from furniture) Impaired (short steps with shuffle, may have difficulty arising from chair, head 0 No down, impaired balance) Mental Status Oriented to own ability 0 Yes Electronic Signature(s) Signed: 12/29/2018 4:26:39 PM By: Montey Hora Entered By: Montey Hora on 12/29/2018 13:41:14 Campoy, Lenda B. ([id number]) -------------------------------------------------------------------------------- Foot Assessment Details Patient Name: Stacy Reeds B. Date of Service: 12/29/2018 1:15 PM Medical Record Number: [id number] Patient Account Number: [id number] Date of Birth/Sex: August 21, 1926 (85 y.o. F) Treating RN: Montey Hora Primary Care Deonte Otting: Myrtie Hawk Other Clinician: Referring Aspyn Warnke: Myrtie Hawk Treating Eamon Tantillo/Extender: Tito Dine in Treatment: 0 Foot Assessment Items Site Locations + = Sensation present, - = Sensation absent, C = Callus, U = Ulcer R = Redness, W = Warmth, M = Maceration, PU = Pre-ulcerative lesion F = Fissure, S = Swelling, D = Dryness Assessment Right: Left: Other  Deformity: No No Prior Foot Ulcer: No No Prior Amputation: No No Charcot Joint: No No Ambulatory Status: Non-ambulatory Assistance Device: Wheelchair Gait: Steady Electronic Signature(s) Signed: 12/29/2018 4:26:39 PM By: Montey Hora Entered By: Montey Hora on 12/29/2018 13:50:39 Flud, Brooklee B. ([id number]) -------------------------------------------------------------------------------- Nutrition Risk Screening Details Patient Name: Stacy Reeds B. Date of Service: 12/29/2018 1:15 PM Medical Record Number: [id number] Patient Account Number: [id number] Date of Birth/Sex: 1926/03/19 (85 y.o. F) Treating RN: Montey Hora Primary Care Adyn Hoes: Myrtie Hawk Other Clinician: Referring Irwin Toran: Myrtie Hawk Treating Evagelia Knack/Extender: Tito Dine in Treatment: 0 Height (in): 63 Weight (lbs): 135 Body Mass Index (BMI): 23.9 Nutrition Risk Screening Items Score Screening NUTRITION RISK SCREEN: I have an illness or condition that made me change the kind and/or amount of 0 No food I eat I eat fewer than two meals per day 0 No I eat few fruits and vegetables, or milk products 0 No I have three or more drinks of beer, liquor or wine almost every day 0 No I have tooth or mouth problems that make it hard for me to eat 0 No I don't always have enough money to buy the food I need 0 No I eat alone most of the time 0 No I take three or more different prescribed or over-the-counter drugs a day 1 Yes Without wanting to, I have lost or gained 10 pounds in the last six months 0 No I am not always physically able to shop, cook and/or feed myself 0 No Nutrition Protocols Good Risk Protocol 0 No interventions needed Moderate Risk Protocol High Risk Proctocol Risk Level: Good Risk Score: 1 Electronic Signature(s) Signed: 12/29/2018 4:26:39 PM By: Montey Hora Entered By: Montey Hora on 12/29/2018 13:41:21
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
## 2018-12-30 NOTE — Progress Notes (Signed)
JOEE, MIZENKO ([id number]) Visit Report for 12/29/2018 Chief Complaint Document Details Patient Name: [name], [name]. Date of Service: 12/29/2018 1:15 PM Medical Record Number: [id number] Patient Account Number: [id number] Date of Birth/Sex: June 24, 1926 (85 y.o. F) Treating RN: Cornell Barman Primary Care Provider: Myrtie Hawk Other Clinician: Referring Provider: Myrtie Hawk Treating Provider/Extender: Tito Dine in Treatment: 0 Information Obtained from: Patient Chief Complaint 12/29/2018; patient is here for review of a sacral ulcer as well as an area on her left heel. These are pressure injuries Electronic Signature(s) Signed: 12/29/2018 5:06:38 PM By: Linton Ham MD Entered By: Linton Ham on 12/29/2018 14:35:34 Pentland, [name] ([id number]) -------------------------------------------------------------------------------- HPI Details Patient Name: Stacy Reeds B. Date of Service: 12/29/2018 1:15 PM Medical Record Number: [id number] Patient Account Number: [id number] Date of Birth/Sex: 1926/10/12 (85 y.o. F) Treating RN: Cornell Barman Primary Care Provider: Myrtie Hawk Other Clinician: Referring Provider: Myrtie Hawk Treating Provider/Extender: Tito Dine in Treatment: 0 History of Present Illness HPI Description: ADMISSION 12/29/2018 Patient is a frail 85 year old woman who is a resident of [facility]. She is here accompanied by friend. According the history she apparently had a right hip fracture sometime earlier this year and went to the nursing home for recovery. She was supposed to return home however she was rehospitalized on 10/5 with a left hip fracture requiring an ORIF. It was noted during this hospitalization she had a stage IV pressure ulcer using wet-to-dry. She was seen at that hospitalization by palliative care but I do not think that is been followed in the facility. A CT scan of her  lumbar spine did not show any acute osseous abnormalities but that may have not shown the wound areas. She also has an area on her left heel the history behind this is a little less certain. Looking through her records her albumin was normal in August 2020 at 3.8. Past medical history; hypertension, hypothyroidism, seborrheic keratosis, lumbar spinal stenosis, gait instability, left hip fracture on 10/5, breast CA on the left apparently also uterine cancer. Type 2 diabetes that is diabetic controlled, hyperlipidemia and bilateral hip surgery ABIs in our clinic were noncompressible on the right and 1.25 on the left Electronic Signature(s) Signed: 12/29/2018 5:06:38 PM By: Linton Ham MD Entered By: Linton Ham on 12/29/2018 14:38:29 Spicer, [name] ([id number]) -------------------------------------------------------------------------------- Physical Exam Details Patient Name: Stacy Reeds B. Date of Service: 12/29/2018 1:15 PM Medical Record Number: [id number] Patient Account Number: [id number] Date of Birth/Sex: [birth year] (85 y.o. F) Treating RN: Cornell Barman Primary Care Provider: Myrtie Hawk Other Clinician: Referring Provider: Myrtie Hawk Treating Provider/Extender: Ricard Dillon Weeks in Treatment: 0 Constitutional Sitting or standing Blood Pressure is within target range for patient.. Pulse regular and within target range for patient.Marland Kitchen Respirations regular, non-labored and within target range.. Temperature is normal and within the target range for the patient.Marland Kitchen appears in no distress. Eyes Conjunctivae clear. No discharge. Ears, Nose, Mouth, and Throat Dry mucous membranes but nothing ominous. She has her own teeth. Respiratory Respiratory effort is easy and symmetric bilaterally. Rate is normal at rest and on room air.. Bilateral breath sounds are clear and equal in all lobes with no wheezes, rales or rhonchi.. Cardiovascular Heart rhythm and  rate regular, without murmur or gallop. She does not look overtly dehydrated. Gastrointestinal (GI) Abdomen is soft and non-distended without masses or tenderness. Bowel sounds active in all quadrants.. No liver or spleen enlargement or tenderness.. Genitourinary (GU) Bladder not distended. Integumentary (Hair, Skin) No erythema around  the wound areas.Marland Kitchen Psychiatric Patient appears depressed today.. Notes Wound exam 1. The patient has a stage IV wound over the lower sacrum. Significant undermining from 12-6 o'clock at about 3 cm lesser degrees of undermining in the rest of the wound. I think there is exposed periosteum. There is no overt infection present 2. On the left heel she has a blister over black eschar. I think this classifies this is an unstageable wound. Probably at least stage II. Again no evidence of infection Electronic Signature(s) Signed: 12/29/2018 5:06:38 PM By: Linton Ham MD Entered By: Linton Ham on 12/29/2018 14:41:06 Brick, [name] ([id number]) -------------------------------------------------------------------------------- Physician Orders Details Patient Name: [name]. Date of Service: 12/29/2018 1:15 PM Medical Record Number: [id number] Patient Account Number: [id number] Date of Birth/Sex: 1927-01-03 (85 y.o. F) Treating RN: Cornell Barman Primary Care Provider: Myrtie Hawk Other Clinician: Referring Provider: Myrtie Hawk Treating Provider/Extender: Tito Dine in Treatment: 0 Verbal / Phone Orders: No Diagnosis Coding Wound Cleansing Wound #1 Midline Sacrum o Clean wound with Normal Saline. o May Shower, gently pat wound dry prior to applying new dressing. Wound #2 Left Calcaneus o Clean wound with Normal Saline. o May Shower, gently pat wound dry prior to applying new dressing. Anesthetic (add to Medication List) Wound #1 Midline Sacrum o Topical Lidocaine 4% cream applied to wound bed prior to  debridement (In Clinic Only). Wound #2 Left Calcaneus o Topical Lidocaine 4% cream applied to wound bed prior to debridement (In Clinic Only). Primary Wound Dressing Wound #1 Midline Sacrum o Santyl Ointment - wet to dry Wound #2 Left Calcaneus o Other: - betadine Secondary Dressing Wound #1 Midline Sacrum o Boardered Foam Dressing Wound #2 Left Calcaneus o Boardered Foam Dressing Dressing Change Frequency Wound #1 Midline Sacrum o Change dressing every day. Wound #2 Left Calcaneus o Change dressing every day. Follow-up Appointments Wound #1 Midline Sacrum o Return Appointment in 2 weeks. Wound #2 Left Calcaneus o Return Appointment in 2 weeks. Dykes, Rigby B. ([id number]) Off-Loading Wound #1 Midline Sacrum o Roho cushion for wheelchair - Patient in wheelchair only for meals and therapy. o Turn and reposition every 2 hours o Other: - Keep pressure off of heels in the bed and in the wheelchair. Patient in wheelchair only for meals and therapy. Wound #2 Left Calcaneus o Turn and reposition every 2 hours o Other: - Keep pressure off of heels in the bed and in the wheelchair Medications-please add to medication list. Wound #1 Midline Sacrum o Santyl Enzymatic Ointment Electronic Signature(s) Signed: 12/29/2018 4:31:46 PM By: Gretta Cool, BSN, RN, CWS, Kim RN, BSN Signed: 12/29/2018 5:06:38 PM By: Linton Ham MD Entered By: Gretta Cool, BSN, RN, CWS, Kim on 12/29/2018 14:21:29 [name], [name] ([id number]) -------------------------------------------------------------------------------- Problem List Details Patient Name: ALLINSON, PORTELLI B. Date of Service: 12/29/2018 1:15 PM Medical Record Number: [id number] Patient Account Number: [id number] Date of Birth/Sex: 1927/01/31 (85 y.o. F) Treating RN: Cornell Barman Primary Care Provider: Myrtie Hawk Other Clinician: Referring Provider: Myrtie Hawk Treating Provider/Extender: Tito Dine in Treatment: 0 Active Problems ICD-10 Evaluated Encounter Code Description Active Date Today Diagnosis L89.154 Pressure ulcer of sacral region, stage 4 12/29/2018 No Yes L89.620 Pressure ulcer of left heel, unstageable 12/29/2018 No Yes Inactive Problems Resolved Problems Electronic Signature(s) Signed: 12/29/2018 5:06:38 PM By: Linton Ham MD Entered By: Linton Ham on 12/29/2018 14:33:39 [name], [name] ([id number]) -------------------------------------------------------------------------------- Progress Note Details Patient Name: Stacy Reeds B. Date of Service: 12/29/2018 1:15 PM Medical Record Number: [id number] Patient  Account Number: [id number] Date of Birth/Sex: [birth year] (85 y.o. F) Treating RN: Cornell Barman Primary Care Provider: Myrtie Hawk Other Clinician: Referring Provider: Myrtie Hawk Treating Provider/Extender: Tito Dine in Treatment: 0 Subjective Chief Complaint Information obtained from Patient 12/29/2018; patient is here for review of a sacral ulcer as well as an area on her left heel. These are pressure injuries History of Present Illness (HPI) ADMISSION 12/29/2018 Patient is a frail 85 year old woman who is a resident of [facility]. She is here accompanied by friend. According the history she apparently had a right hip fracture sometime earlier this year and went to the nursing home for recovery. She was supposed to return home however she was rehospitalized on 10/5 with a left hip fracture requiring an ORIF. It was noted during this hospitalization she had a stage IV pressure ulcer using wet-to-dry. She was seen at that hospitalization by palliative care but I do not think that is been followed in the facility. A CT scan of her lumbar spine did not show any acute osseous abnormalities but that may have not shown the wound areas. She also has an area on her left heel the history behind this is a little  less certain. Looking through her records her albumin was normal in August 2020 at 3.8. Past medical history; hypertension, hypothyroidism, seborrheic keratosis, lumbar spinal stenosis, gait instability, left hip fracture on 10/5, breast CA on the left apparently also uterine cancer. Type 2 diabetes that is diabetic controlled, hyperlipidemia and bilateral hip surgery ABIs in our clinic were noncompressible on the right and 1.25 on the left Patient History Information obtained from Patient. Allergies amlodipine (Reaction: edema), atenolol (Reaction: bradycardia), erythromycin base (Reaction: stomach upset), lisinopril (Reaction: cough) Family History No family history of Cancer, Diabetes, Heart Disease, Hereditary Spherocytosis, Hypertension, Kidney Disease, Lung Disease, Seizures, Stroke, Thyroid Problems, Tuberculosis. Social History Never smoker, Marital Status - Widowed, Alcohol Use - Never, Drug Use - No History, Caffeine Use - Daily. Medical History Eyes Denies history of Cataracts, Glaucoma, Optic Neuritis Cardiovascular Patient has history of Hypertension Denies history of Angina, Arrhythmia, Congestive Heart Failure, Coronary Artery Disease, Deep Vein Thrombosis, Hypotension, Myocardial Infarction, Peripheral Arterial Disease, Peripheral Venous Disease, Phlebitis, Vasculitis Endocrine Buenaventura, Diedre B. ([id number]) Patient has history of Type II Diabetes Denies history of Type I Diabetes Genitourinary Denies history of End Stage Renal Disease Integumentary (Skin) Denies history of History of Burn, History of pressure wounds Musculoskeletal Patient has history of Osteoarthritis Denies history of Gout, Rheumatoid Arthritis, Osteomyelitis Oncologic Patient has history of Received Chemotherapy Denies history of Received Radiation Psychiatric Denies history of Anorexia/bulimia, Confinement Anxiety Patient is treated with Controlled Diet. Medical And Surgical History  Notes Eyes macular degeneration Cardiovascular HLD Musculoskeletal bilateral hip replacements, osteopenia Oncologic history of uterine and left breast cancer with mastectomy Review of Systems (ROS) Constitutional Symptoms (General Health) Denies complaints or symptoms of Fatigue, Fever, Chills, Marked Weight Change. Eyes Denies complaints or symptoms of Dry Eyes, Vision Changes, Glasses / Contacts. Ear/Nose/Mouth/Throat Denies complaints or symptoms of Difficult clearing ears, Sinusitis. Hematologic/Lymphatic Denies complaints or symptoms of Bleeding / Clotting Disorders, Human Immunodeficiency Virus. Respiratory Denies complaints or symptoms of Chronic or frequent coughs, Shortness of Breath. Cardiovascular Denies complaints or symptoms of Chest pain, LE edema. Gastrointestinal Denies complaints or symptoms of Frequent diarrhea, Nausea, Vomiting. Endocrine Complains or has symptoms of Thyroid disease. Denies complaints or symptoms of Hepatitis, Polydypsia (Excessive Thirst). Genitourinary Complains or has symptoms of Incontinence/dribbling. Denies complaints or symptoms of Kidney  failure/ Dialysis. Immunological Denies complaints or symptoms of Hives, Itching. Integumentary (Skin) Complains or has symptoms of Wounds. Denies complaints or symptoms of Bleeding or bruising tendency, Breakdown, Swelling. Musculoskeletal Denies complaints or symptoms of Muscle Pain, Muscle Weakness. Neurologic Denies complaints or symptoms of Numbness/parasthesias, Focal/Weakness. Psychiatric Complains or has symptoms of Anxiety. Denies complaints or symptoms of Claustrophobia. [name], [name] ([id number]) Objective Constitutional Sitting or standing Blood Pressure is within target range for patient.. Pulse regular and within target range for patient.Marland Kitchen Respirations regular, non-labored and within target range.. Temperature is normal and within the target range for the patient.Marland Kitchen appears in  no distress. Vitals Time Taken: 1:30 PM, Height: 63 in, Source: Stated, Weight: 135 lbs, Source: Measured, BMI: 23.9, Temperature: 98.6 F, Pulse: 74 bpm, Respiratory Rate: 16 breaths/min, Blood Pressure: 130/51 mmHg. Eyes Conjunctivae clear. No discharge. Ears, Nose, Mouth, and Throat Dry mucous membranes but nothing ominous. She has her own teeth. Respiratory Respiratory effort is easy and symmetric bilaterally. Rate is normal at rest and on room air.. Bilateral breath sounds are clear and equal in all lobes with no wheezes, rales or rhonchi.. Cardiovascular Heart rhythm and rate regular, without murmur or gallop. She does not look overtly dehydrated. Gastrointestinal (GI) Abdomen is soft and non-distended without masses or tenderness. Bowel sounds active in all quadrants.. No liver or spleen enlargement or tenderness.. Genitourinary (GU) Bladder not distended. Psychiatric Patient appears depressed today.. General Notes: Wound exam 1. The patient has a stage IV wound over the lower sacrum. Significant undermining from 12-6 o'clock at about 3 cm lesser degrees of undermining in the rest of the wound. I think there is exposed periosteum. There is no overt infection present 2. On the left heel she has a blister over black eschar. I think this classifies this is an unstageable wound. Probably at least stage II. Again no evidence of infection Integumentary (Hair, Skin) No erythema around the wound areas.. Wound #1 status is Open. Original cause of wound was Gradually Appeared. The wound is located on the Midline Sacrum. The wound measures 3cm length x 2.7cm width x 1.9cm depth; 6.362cm^2 area and 12.087cm^3 volume. There is Fat Layer (Subcutaneous Tissue) Exposed and fascia exposed. Tunneling has been noted at 3:00 with a maximum distance of 2.6cm. There is additional tunneling at 4:00 with a maximum distance of 3.5cm, and at 12:00 with a maximum distance of 1.8cm. Undermining begins at  12:00 and ends at 12:00 with a maximum distance of 3.5cm. There is a medium amount of purulent drainage noted. The wound margin is flat and intact. There is medium (34-66%) pink granulation within the wound bed. There is a medium (34-66%) amount of necrotic tissue within the wound bed including Adherent Slough. [name], [name] ([id number]) Wound #2 status is Open. Original cause of wound was Pressure Injury. The wound is located on the Left Calcaneus. The wound measures 2cm length x 2.3cm width x 0.1cm depth; 3.613cm^2 area and 0.361cm^3 volume. The wound is limited to skin breakdown. There is no tunneling or undermining noted. There is a none present amount of drainage noted. The wound margin is indistinct and nonvisible. There is no granulation within the wound bed. There is a large (67-100%) amount of necrotic tissue within the wound bed including Eschar. Assessment Active Problems ICD-10 Pressure ulcer of sacral region, stage 4 Pressure ulcer of left heel, unstageable Plan Wound Cleansing: Wound #1 Midline Sacrum: Clean wound with Normal Saline. May Shower, gently pat wound dry prior to applying new dressing. Wound #  2 Left Calcaneus: Clean wound with Normal Saline. May Shower, gently pat wound dry prior to applying new dressing. Anesthetic (add to Medication List): Wound #1 Midline Sacrum: Topical Lidocaine 4% cream applied to wound bed prior to debridement (In Clinic Only). Wound #2 Left Calcaneus: Topical Lidocaine 4% cream applied to wound bed prior to debridement (In Clinic Only). Primary Wound Dressing: Wound #1 Midline Sacrum: Santyl Ointment - wet to dry Wound #2 Left Calcaneus: Other: - betadine Secondary Dressing: Wound #1 Midline Sacrum: Boardered Foam Dressing Wound #2 Left Calcaneus: Boardered Foam Dressing Dressing Change Frequency: Wound #1 Midline Sacrum: Change dressing every day. Wound #2 Left Calcaneus: Change dressing every day. Follow-up  Appointments: Wound #1 Midline Sacrum: Return Appointment in 2 weeks. Wound #2 Left Calcaneus: Return Appointment in 2 weeks. Off-LoadingFRANCESS, [name] ([id number]) Wound #1 Midline Sacrum: Roho cushion for wheelchair - Patient in wheelchair only for meals and therapy. Turn and reposition every 2 hours Other: - Keep pressure off of heels in the bed and in the wheelchair. Patient in wheelchair only for meals and therapy. Wound #2 Left Calcaneus: Turn and reposition every 2 hours Other: - Keep pressure off of heels in the bed and in the wheelchair Medications-please add to medication list.: Wound #1 Midline Sacrum: Santyl Enzymatic Ointment 1. I continued with the Santyl followed by saline wet-to-dry over the top. We may be able to clean this off a bit more and consider her for a wound VAC if her nutritional status allows 2. We cautioned about strict pressure relief 3. Check prealbumin level/nutritional status 4. Betadine and the foam cover on her left heel. Meticulous offloading here as well 5. I saw no evidence of infection no cultures were done 6. This is a very frail elderly woman. This sacral wound may be difficult to close. Electronic Signature(s) Signed: 12/29/2018 5:06:38 PM By: Linton Ham MD Entered By: Linton Ham on 12/29/2018 14:42:52 [name], [name] ([id number]) -------------------------------------------------------------------------------- ROS/PFSH Details Patient Name: [name]. Date of Service: 12/29/2018 1:15 PM Medical Record Number: [id number] Patient Account Number: [id number] Date of Birth/Sex: 05-25-1926 (85 y.o. F) Treating RN: Montey Hora Primary Care Provider: Myrtie Hawk Other Clinician: Referring Provider: Myrtie Hawk Treating Provider/Extender: Tito Dine in Treatment: 0 Information Obtained From Patient Constitutional Symptoms (General Health) Complaints and Symptoms: Negative for: Fatigue;  Fever; Chills; Marked Weight Change Eyes Complaints and Symptoms: Negative for: Dry Eyes; Vision Changes; Glasses / Contacts Medical History: Negative for: Cataracts; Glaucoma; Optic Neuritis Past Medical History Notes: macular degeneration Ear/Nose/Mouth/Throat Complaints and Symptoms: Negative for: Difficult clearing ears; Sinusitis Hematologic/Lymphatic Complaints and Symptoms: Negative for: Bleeding / Clotting Disorders; Human Immunodeficiency Virus Respiratory Complaints and Symptoms: Negative for: Chronic or frequent coughs; Shortness of Breath Cardiovascular Complaints and Symptoms: Negative for: Chest pain; LE edema Medical History: Positive for: Hypertension Negative for: Angina; Arrhythmia; Congestive Heart Failure; Coronary Artery Disease; Deep Vein Thrombosis; Hypotension; Myocardial Infarction; Peripheral Arterial Disease; Peripheral Venous Disease; Phlebitis; Vasculitis Past Medical History Notes: HLD Gastrointestinal Complaints and Symptoms: Negative for: Frequent diarrhea; Nausea; Vomiting [name], [name] ([id number]) Endocrine Complaints and Symptoms: Positive for: Thyroid disease Negative for: Hepatitis; Polydypsia (Excessive Thirst) Medical History: Positive for: Type II Diabetes Negative for: Type I Diabetes Treated with: Diet Genitourinary Complaints and Symptoms: Positive for: Incontinence/dribbling Negative for: Kidney failure/ Dialysis Medical History: Negative for: End Stage Renal Disease Immunological Complaints and Symptoms: Negative for: Hives; Itching Integumentary (Skin) Complaints and Symptoms: Positive for: Wounds Negative for: Bleeding or bruising tendency; Breakdown; Swelling  Medical History: Negative for: History of Burn; History of pressure wounds Musculoskeletal Complaints and Symptoms: Negative for: Muscle Pain; Muscle Weakness Medical History: Positive for: Osteoarthritis Negative for: Gout; Rheumatoid Arthritis;  Osteomyelitis Past Medical History Notes: bilateral hip replacements, osteopenia Neurologic Complaints and Symptoms: Negative for: Numbness/parasthesias; Focal/Weakness Psychiatric Complaints and Symptoms: Positive for: Anxiety Negative for: Claustrophobia Medical History: Negative for: Anorexia/bulimia; Confinement Anxiety [name], [name] ([id number]) Oncologic Medical History: Positive for: Received Chemotherapy Negative for: Received Radiation Past Medical History Notes: history of uterine and left breast cancer with mastectomy Immunizations Pneumococcal Vaccine: Received Pneumococcal Vaccination: Yes Implantable Devices None Family and Social History Cancer: No; Diabetes: No; Heart Disease: No; Hereditary Spherocytosis: No; Hypertension: No; Kidney Disease: No; Lung Disease: No; Seizures: No; Stroke: No; Thyroid Problems: No; Tuberculosis: No; Never smoker; Marital Status - Widowed; Alcohol Use: Never; Drug Use: No History; Caffeine Use: Daily; Financial Concerns: No; Food, Clothing or Shelter Needs: No; Support System Lacking: No; Transportation Concerns: No Notes patient does not know family medical history Electronic Signature(s) Signed: 12/29/2018 4:26:39 PM By: Montey Hora Signed: 12/29/2018 5:06:38 PM By: Linton Ham MD Entered By: Montey Hora on 12/29/2018 13:39:24 [name], [name] ([id number]) -------------------------------------------------------------------------------- SuperBill Details Patient Name: Stacy Reeds B. Date of Service: 12/29/2018 Medical Record Number: [id number] Patient Account Number: [id number] Date of Birth/Sex: 01/13/1927 (85 y.o. F) Treating RN: Cornell Barman Primary Care Provider: Myrtie Hawk Other Clinician: Referring Provider: Myrtie Hawk Treating Provider/Extender: Ricard Dillon Weeks in Treatment: 0 Diagnosis Coding ICD-10 Codes Code Description L89.154 Pressure ulcer of sacral region, stage  4 L89.620 Pressure ulcer of left heel, unstageable Facility Procedures CPT4 Code: [id number] Description: [phone] - WOUND CARE VISIT-LEV 5 EST PT Modifier: Quantity: 1 Physician Procedures CPT4 Code: [id number] Description: WC PHYS LEVEL 3 o NEW PT ICD-10 Diagnosis Description L89.154 Pressure ulcer of sacral region, stage 4 L89.620 Pressure ulcer of left heel, unstageable Modifier: Quantity: 1 Electronic Signature(s) Signed: 12/29/2018 5:06:38 PM By: Linton Ham MD Entered By: Linton Ham on 12/29/2018 14:43:30

## 2019-01-04 ENCOUNTER — Other Ambulatory Visit: Payer: Self-pay

## 2019-01-04 ENCOUNTER — Ambulatory Visit (INDEPENDENT_AMBULATORY_CARE_PROVIDER_SITE_OTHER): Payer: Medicare Other | Admitting: Urology

## 2019-01-04 ENCOUNTER — Encounter: Payer: Self-pay | Admitting: Urology

## 2019-01-04 VITALS — BP 158/80 | HR 75 | Ht 63.0 in | Wt 135.0 lb

## 2019-01-04 DIAGNOSIS — R339 Retention of urine, unspecified: Secondary | ICD-10-CM

## 2019-01-04 NOTE — Patient Instructions (Signed)

## 2019-01-04 NOTE — Progress Notes (Signed)
01/04/2019 2:50 PM   [name] Nov 22, 1926 [id number]  Referring provider: McLean-Scocuzza, Nino Glow, MD [place],  [place] [address]  Chief Complaint  Patient presents with  . Urinary Retention    HPI: Unfortunate 85 year old female who was recently sustained multiple falls and bilateral hip fracture, compression ulcer who presents today for evaluation of urinary retention.  She had multiple admissions over the recent past for the above issues.  It appears that during 1 of these admissions, Foley catheter was placed and ultimately she was discharged to her current facility with a catheter.  It was in place until about a week ago per her friend who accompanies her today.  Patient herself is somewhat of a poor historian.  The patient and her friend report that she was told that she had some sort of "blockage".  Also concerned about the quality of care she has been receiving at her facility.  They believe that the catheter was causing her to have a urinary tract infections for which she has been treated on multiple occasions.  After the catheter was removed about a week ago, she has been incontinent and sitting in her own soiled diaper for hours at a time.  Prior to all the above admissions and unfortunate circumstances, the patient reports that she had no issues voiding whatsoever.  PVR today approximately 500 cc.  She is unable to void.  She is in a wheelchair and not really able to ambulate or transfer.  She is being followed for a large stage IV compression ulcer of the sacrum.  She also has a compression ulcer on her heel.   PMH: Past Medical History:  Diagnosis Date  . Anxiety 12/2002  . Arthritis    hands  . Cancer ([place])    breast left arm  . Cancer ([place])    uterine  . Diabetes mellitus 11/2004   Type 2  . Dyspnea    WITH EXERTION  . GERD (gastroesophageal reflux disease)   . Hyperlipidemia 11/2004  . Hypertension 12/2002  . Hypothyroidism   .  Osteopenia 02/2002   02/2002 Dexa osteopenia// Dexa 02/26/2004 (Dr. Ubaldo Glassing) stable/IMR femur/spine  . Spinal stenosis   . 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 Ronnald Collum endocrinologist  . Vertigo     Surgical History: Past Surgical History:  Procedure Laterality Date  . ABDOMINAL HYSTERECTOMY  1990   endometrial adenoca  . BREAST SURGERY  1980   left breast lumpectomy //abscess of breast evac.  Marland Kitchen CATARACT EXTRACTION W/PHACO Left 12/11/2016   Procedure: CATARACT EXTRACTION PHACO AND INTRAOCULAR LENS PLACEMENT (IOC);  Surgeon: Eulogio Bear, MD;  Location: [facility];  Service: Ophthalmology;  Laterality: Left;  [place] 1:18.4 AP 36.6% CDE 16.13  . CATARACT EXTRACTION W/PHACO Right 01/01/2017   Procedure: CATARACT EXTRACTION PHACO AND INTRAOCULAR LENS PLACEMENT (IOC);  Surgeon: Eulogio Bear, MD;  Location: [facility];  Service: Ophthalmology;  Laterality: Right;  [place]: 01:45.5 AP% 16.6 CDE 18.21 Fluid pack lot # [id number]  . CHOLECYSTECTOMY  07/10/1987  . EYE SURGERY     Tear Duct surgery, b/l cataract 2019   . FEMUR IM NAIL Right 12/13/2018   Procedure: INTRAMEDULLARY (IM) NAIL FEMORAL, RIGHT;  Surgeon: Corky Mull, MD;  Location: [facility];  Service: Orthopedics;  Laterality: Right;  . INTRAMEDULLARY (IM) NAIL INTERTROCHANTERIC Left 10/28/2018   Procedure: INTRAMEDULLARY (IM) NAIL INTERTROCHANTRIC LEFT LONG;  Surgeon: Hessie Knows, MD;  Location: [facility];  Service: Orthopedics;  Laterality: Left;  Marland Kitchen MASTECTOMY Left     Home Medications:  Allergies as of 01/04/2019      Reactions   Amlodipine Other (See Comments)   Leg edema    Atenolol Other (See Comments)   REACTION: bradycardia   Erythromycin Base Other (See Comments)   REACTION: stomach upset   Lisinopril Other (See Comments)   REACTION: cough  (20 mg)      Medication List       Accurate as of January 04, 2019  2:50 PM. If you have any questions, ask your nurse or doctor.         STOP taking these medications   acetaminophen 325 MG tablet Commonly known as: TYLENOL Stopped by: Hollice Espy, MD     TAKE these medications   aspirin 81 MG tablet Take 81 mg by mouth daily. Daily with food   calcium carbonate 1250 (500 Ca) MG tablet Commonly known as: OS-CAL - dosed in mg of elemental calcium Take 1 tablet (500 mg of elemental calcium total) by mouth daily with breakfast.   Cholecalciferol 1.25 MG (50000 UT) capsule Take 1 capsule (50,000 Units total) by mouth once a week.   docusate sodium 100 MG capsule Commonly known as: COLACE Take 1 capsule (100 mg total) by mouth 2 (two) times daily as needed for mild constipation.   enoxaparin 40 MG/0.4ML injection Commonly known as: LOVENOX Inject 0.4 mLs (40 mg total) into the skin daily for 14 days.   ferrous sulfate 325 (65 FE) MG tablet Take 1 tablet (325 mg total) by mouth daily.   gabapentin 100 MG capsule Commonly known as: NEURONTIN TAKE ONE (1) CAPSULE BY MOUTH 2 TIMES DAILY   levothyroxine 50 MCG tablet Commonly known as: SYNTHROID Take 1 tablet (50 mcg total) by mouth daily.   losartan 100 MG tablet Commonly known as: COZAAR Take 1 tablet (100 mg total) by mouth daily.   mometasone 0.1 % cream Commonly known as: Elocon Apply 1 application topically daily. To affected areas behind ears   polyethylene glycol powder 17 GM/SCOOP powder Commonly known as: GLYCOLAX/MIRALAX Take 17 g by mouth daily as needed. As directed for constipation   traMADol 50 MG tablet Commonly known as: ULTRAM Take 1 tablet (50 mg total) by mouth every 6 (six) hours as needed for moderate pain.   vitamin C 500 MG tablet Commonly known as: ASCORBIC ACID Take 500 mg by mouth daily.       Allergies:  Allergies  Allergen Reactions  . Amlodipine Other (See Comments)    Leg edema   . Atenolol Other (See Comments)    REACTION: bradycardia  . Erythromycin Base Other (See Comments)    REACTION: stomach upset  .  Lisinopril Other (See Comments)    REACTION: cough  (20 mg)    Family History: Family History  Problem Relation Age of Onset  . Alzheimer's disease Mother   . Heart disease Mother 52       angina  . Hypertension Mother   . Crohn's disease Mother        ? crohns disease  . Cancer Father 70       colon cancer  . Cancer Sister 35       breast cancer   . COPD Sister        emphysema (smoker)  . Heart disease Sister        angina  . Alcohol abuse Brother   . Diabetes Paternal Aunt   .  Hypertension Sister   . Stroke Neg Hx     Social History:  reports that she has never smoked. She has never used smokeless tobacco. She reports that she does not drink alcohol or use drugs.  ROS: UROLOGY Frequent Urination?: Yes Hard to postpone urination?: Yes Burning/pain with urination?: No Get up at night to urinate?: Yes Leakage of urine?: No Urine stream starts and stops?: Yes Trouble starting stream?: No Do you have to strain to urinate?: No Blood in urine?: No Urinary tract infection?: No Sexually transmitted disease?: No Injury to kidneys or bladder?: No Painful intercourse?: No Weak stream?: No Currently pregnant?: No Vaginal bleeding?: No Last menstrual period?: n  Gastrointestinal Nausea?: No Vomiting?: No Indigestion/heartburn?: No Diarrhea?: No Constipation?: No  Constitutional Fever: No Night sweats?: No Weight loss?: No Fatigue?: No  Skin Skin rash/lesions?: No Itching?: No  Eyes Blurred vision?: No Double vision?: No  Ears/Nose/Throat Sore throat?: No Sinus problems?: No  Hematologic/Lymphatic Swollen glands?: No Easy bruising?: No  Cardiovascular Leg swelling?: No Chest pain?: No  Respiratory Cough?: No Shortness of breath?: No  Endocrine Excessive thirst?: No  Musculoskeletal Back pain?: No Joint pain?: No  Neurological Headaches?: No Dizziness?: No  Psychologic Depression?: No Anxiety?: No  Physical Exam: BP (!) 158/80    Pulse 75   Ht 5\' 3"  (1.6 m)   Wt 135 lb (61.2 kg)   BMI 23.91 kg/m   Constitutional:  Alert and oriented, No acute distress.  In wheelchair, frail appearing.  Able to answer some but not all questions.  Accompanied by friend today. HEENT: [place] AT, moist mucus membranes.  Trachea midline, no masses. Cardiovascular: No clubbing, cyanosis, or edema. Respiratory: Normal respiratory effort, no increased work of breathing. Skin: No rashes, bruises or suspicious lesions. Neurologic: Grossly intact, no focal deficits, moving all 4 extremities. Psychiatric: Normal mood and affect.  Laboratory Data: Lab Results  Component Value Date   WBC 13.2 (H) 12/15/2018   HGB 8.2 (L) 12/15/2018   HCT 25.5 (L) 12/15/2018   MCV 88.5 12/15/2018   PLT 350 12/15/2018    Lab Results  Component Value Date   CREATININE 0.66 12/15/2018     Lab Results  Component Value Date   HGBA1C 5.7 (H) 10/27/2018     Assessment & Plan:    1. Urinary retention Chronic urinary retention, likely multifactorial secondary to inability to ambulate, acute medical illness amongst others.  Post void residual today is significantly elevated, greater than 400 cc patient is unable to void.  We discussed options including replacement of her Foley catheter versus continued conservative management.  I have multiple concerns given the volume of urine in her bladder today, concern for renal compromise and infection with not emptying, along with concern for her overall hygiene and sacral decubitus ulcer as she sits in urine which will likely continue to cause skin breakdown of her sacral ulcer.  We discussed replacement of her Foley catheter for the time being until her clinical status is improved as well as she has made progress in healing her sacral decubitus ulcer.  She understands the risk factor for UTI both with and without catheter.  Ultimately, she is more comfortable having the catheter placed and her friend is agreeable to  this plan.  We will plan to reassess her in about a month to see if she is ambulating and reassess for the possibility of a voiding trial at that time.  All questions answered.   Follow-up in 1 month with PA  for possible voiding trial depending on clinical status  Hollice Espy, MD  [facility] [address], [place], [place] [address] [phone]

## 2019-01-04 NOTE — Progress Notes (Signed)
Simple Catheter Placement  Due to urinary retention patient is present today for a foley cath placement.  Patient was cleaned and prepped in a sterile fashion with betadine and lidocaine jelly 2% was instilled into the urethra.  A 16 FR foley catheter was inserted, urine return was noted  442ml, urine was cloudy yellow in color.  The balloon was filled with 10cc of sterile water.  A night bag was attached for drainage.Patient was given instruction on proper catheter care.  Patient tolerated well, no complications were noted   Performed by: Fonnie Jarvis, CMA  Additional notes/ Follow up: 8month

## 2019-01-12 ENCOUNTER — Other Ambulatory Visit
Admission: RE | Admit: 2019-01-12 | Discharge: 2019-01-12 | Disposition: A | Discharge status: Home / Self Care | Payer: Medicare Other | Source: Ambulatory Visit | Attending: Internal Medicine | Admitting: Internal Medicine

## 2019-01-12 ENCOUNTER — Other Ambulatory Visit: Payer: Self-pay

## 2019-01-12 ENCOUNTER — Encounter: Payer: Medicare Other | Attending: Internal Medicine | Admitting: Internal Medicine

## 2019-01-12 DIAGNOSIS — Z8781 Personal history of (healed) traumatic fracture: Secondary | ICD-10-CM | POA: Diagnosis not present

## 2019-01-12 DIAGNOSIS — E119 Type 2 diabetes mellitus without complications: Secondary | ICD-10-CM | POA: Diagnosis not present

## 2019-01-12 DIAGNOSIS — B999 Unspecified infectious disease: Secondary | ICD-10-CM | POA: Diagnosis not present

## 2019-01-12 DIAGNOSIS — L821 Other seborrheic keratosis: Secondary | ICD-10-CM | POA: Diagnosis not present

## 2019-01-12 DIAGNOSIS — L89620 Pressure ulcer of left heel, unstageable: Secondary | ICD-10-CM | POA: Insufficient documentation

## 2019-01-12 DIAGNOSIS — R2689 Other abnormalities of gait and mobility: Secondary | ICD-10-CM | POA: Insufficient documentation

## 2019-01-12 DIAGNOSIS — Z8542 Personal history of malignant neoplasm of other parts of uterus: Secondary | ICD-10-CM | POA: Diagnosis not present

## 2019-01-12 DIAGNOSIS — M1990 Unspecified osteoarthritis, unspecified site: Secondary | ICD-10-CM | POA: Diagnosis not present

## 2019-01-12 DIAGNOSIS — E039 Hypothyroidism, unspecified: Secondary | ICD-10-CM | POA: Insufficient documentation

## 2019-01-12 DIAGNOSIS — E785 Hyperlipidemia, unspecified: Secondary | ICD-10-CM | POA: Insufficient documentation

## 2019-01-12 DIAGNOSIS — L89154 Pressure ulcer of sacral region, stage 4: Secondary | ICD-10-CM | POA: Diagnosis not present

## 2019-01-12 DIAGNOSIS — L89629 Pressure ulcer of left heel, unspecified stage: Secondary | ICD-10-CM | POA: Diagnosis not present

## 2019-01-12 DIAGNOSIS — M48061 Spinal stenosis, lumbar region without neurogenic claudication: Secondary | ICD-10-CM | POA: Diagnosis not present

## 2019-01-12 DIAGNOSIS — I10 Essential (primary) hypertension: Secondary | ICD-10-CM | POA: Insufficient documentation

## 2019-01-12 DIAGNOSIS — Z853 Personal history of malignant neoplasm of breast: Secondary | ICD-10-CM | POA: Diagnosis not present

## 2019-01-14 NOTE — Progress Notes (Signed)
JAYDN, HAZEN ([id number]) Visit Report for 01/12/2019 Arrival Information Details Patient Name: [name], [name]. Date of Service: 01/12/2019 9:30 AM Medical Record Number: [id number] Patient Account Number: [id number] Date of Birth/Sex: [birth year] (85 y.o. F) Treating RN: [name] Primary Care [name]: [name] Other Clinician: Referring [name]: [name] Treating [name]/Extender: [name] in Treatment: 2 Visit Information History Since Last Visit Added or deleted any medications: No Patient Arrived: Wheel Chair Any new allergies or adverse reactions: No Arrival Time: 09:27 Had a fall or experienced change in No Accompanied By: self activities of daily living that may affect Transfer Assistance: Harrel Lemon Lift risk of falls: Patient Identification Verified: Yes Signs or symptoms of abuse/neglect since last visito No Secondary Verification Process Yes Hospitalized since last visit: No Completed: Has Dressing in Place as Prescribed: Yes Patient Has Alerts: Yes Pain Present Now: Yes Patient Alerts: Patient on Blood Thinner DMII Lovenox Electronic Signature(s) Signed: 01/13/2019 4:15:06 PM By: [name] Entered By: [name] on 01/12/2019 09:32:30 [name], [name] Kitchen ([id number]) -------------------------------------------------------------------------------- Clinic Level of Care Assessment Details Patient Name: [name]. Date of Service: 01/12/2019 9:30 AM Medical Record Number: [id number] Patient Account Number: [id number] Date of Birth/Sex: 08-21-1926 (85 y.o. F) Treating RN: [name] Primary Care Innocence Schlotzhauer: [name] Other Clinician: Referring [name]: [name] Treating [name]/Extender: [name] in Treatment: 2 Clinic Level of Care Assessment Items TOOL 4 Quantity Score []  - Use when only an EandM is performed on FOLLOW-UP visit 0 ASSESSMENTS - Nursing  Assessment / Reassessment X - Reassessment of Co-morbidities (includes updates in patient status) 1 10 X- 1 5 Reassessment of Adherence to Treatment Plan ASSESSMENTS - Wound and Skin Assessment / Reassessment []  - Simple Wound Assessment / Reassessment - one wound 0 X- 2 5 Complex Wound Assessment / Reassessment - multiple wounds []  - 0 Dermatologic / Skin Assessment (not related to wound area) ASSESSMENTS - Focused Assessment []  - Circumferential Edema Measurements - multi extremities 0 []  - 0 Nutritional Assessment / Counseling / Intervention []  - 0 Lower Extremity Assessment (monofilament, tuning fork, pulses) []  - 0 Peripheral Arterial Disease Assessment (using hand held doppler) ASSESSMENTS - Ostomy and/or Continence Assessment and Care []  - Incontinence Assessment and Management 0 []  - 0 Ostomy Care Assessment and Management (repouching, etc.) PROCESS - Coordination of Care []  - Simple Patient / Family Education for ongoing care 0 X- 1 20 Complex (extensive) Patient / Family Education for ongoing care []  - 0 Staff obtains Programmer, systems, Records, Test Results / Process Orders []  - 0 Staff telephones HHA, Nursing Homes / Clarify orders / etc []  - 0 Routine Transfer to another Facility (non-emergent condition) []  - 0 Routine Hospital Admission (non-emergent condition) []  - 0 New Admissions / Biomedical engineer / Ordering NPWT, Apligraf, etc. []  - 0 Emergency Hospital Admission (emergent condition) []  - 0 Simple Discharge Coordination [name], [name]. ([id number]) X- 1 15 Complex (extensive) Discharge Coordination PROCESS - Special Needs []  - Pediatric / Minor Patient Management 0 []  - 0 Isolation Patient Management []  - 0 Hearing / Language / Visual special needs []  - 0 Assessment of Community assistance (transportation, D/C planning, etc.) []  - 0 Additional assistance / Altered mentation []  - 0 Support Surface(s) Assessment (bed, cushion, seat,  etc.) INTERVENTIONS - Wound Cleansing / Measurement []  - Simple Wound Cleansing - one wound 0 X- 2 5 Complex Wound Cleansing - multiple wounds X- 1 5 Wound Imaging (photographs - any number of wounds) []  -  0 Wound Tracing (instead of photographs) []  - 0 Simple Wound Measurement - one wound X- 2 5 Complex Wound Measurement - multiple wounds INTERVENTIONS - Wound Dressings X - Small Wound Dressing one or multiple wounds 1 10 []  - 0 Medium Wound Dressing one or multiple wounds X- 1 20 Large Wound Dressing one or multiple wounds []  - 0 Application of Medications - topical []  - 0 Application of Medications - injection INTERVENTIONS - Miscellaneous []  - External ear exam 0 X- 1 5 Specimen Collection (cultures, biopsies, blood, body fluids, etc.) X- 1 5 Specimen(s) / Culture(s) sent or taken to Lab for analysis []  - 0 Patient Transfer (multiple staff / Civil Service fast streamer / Similar devices) []  - 0 Simple Staple / Suture removal (25 or less) []  - 0 Complex Staple / Suture removal (26 or more) []  - 0 Hypo / Hyperglycemic Management (close monitor of Blood Glucose) []  - 0 Ankle / Brachial Index (ABI) - do not check if billed separately X- 1 5 Vital Signs [name]. ([id number]) Has the patient been seen at the hospital within the last three years: Yes Total Score: 130 Level Of Care: New/Established - Level 4 Electronic Signature(s) Signed: 01/13/2019 5:19:38 PM By: [name], BSN, RN, CWS, [name] Entered By: [name], BSN, RN, [name] on 01/12/2019 10:03:43 [name], [name] ([id number]) -------------------------------------------------------------------------------- Encounter Discharge Information Details Patient Name: [name]. Date of Service: 01/12/2019 9:30 AM Medical Record Number: [id number] Patient Account Number: [id number] Date of Birth/Sex: 07-21-1926 (85 y.o. F) Treating RN: [name] Primary Care Yaritza Leist: [name] Other Clinician: Referring  [name]: [name] Treating [name]/Extender: [name] in Treatment: 2 Encounter Discharge Information Items Discharge Condition: Stable Ambulatory Status: Wheelchair Discharge Destination: Home Transportation: Private Auto Accompanied By: self Schedule Follow-up Appointment: Yes Clinical Summary of Care: Electronic Signature(s) Signed: 01/13/2019 5:19:38 PM By: [name], BSN, RN, CWS, [name] Entered By: [name], BSN, RN, [name] on 01/12/2019 10:05:34 [name] ([id number]) -------------------------------------------------------------------------------- Lower Extremity Assessment Details Patient Name: Askin, Alecia Lemming [name]. Date of Service: 01/12/2019 9:30 AM Medical Record Number: [id number] Patient Account Number: [id number] Date of Birth/Sex: [birth year] (85 y.o. F) Treating RN: [name] Primary Care Doroteo Nickolson: [name] Other Clinician: Referring Lorenzo Pereyra: [name] Treating Zannie Runkle/Extender: Ricard Dillon Weeks in Treatment: 2 Vascular Assessment Pulses: Dorsalis Pedis Palpable: [Left:Yes] Posterior Tibial Palpable: [Left:Yes] Electronic Signature(s) Signed: 01/13/2019 4:15:06 PM By: [name] Entered By: [name] on 01/12/2019 09:44:35 Tumblin, Priseis [name]. ([id number]) -------------------------------------------------------------------------------- Multi Wound Chart Details Patient Name: [name]. Date of Service: 01/12/2019 9:30 AM Medical Record Number: [id number] Patient Account Number: [id number] Date of Birth/Sex: 1926/11/11 (85 y.o. F) Treating RN: [name] Primary Care Tahjae Clausing: [name] Other Clinician: Referring Safina Huard: [name] Treating Charlot Gouin/Extender: [name] in Treatment: 2 Vital Signs Height(in): 82 Pulse(bpm): 35 Weight(lbs): 135 Blood Pressure(mmHg): 102/84 Body Mass Index(BMI): 24 Temperature(F): 97.9 Respiratory  Rate 16 (breaths/min): Photos: [N/A:N/A] Wound Location: Sacrum - Midline Left Calcaneus N/A Wounding Event: Gradually Appeared Pressure Injury N/A Primary Etiology: Pressure Ulcer Pressure Ulcer N/A Comorbid History: Hypertension, Type II Hypertension, Type II N/A Diabetes, Osteoarthritis, Diabetes, Osteoarthritis, Received Chemotherapy Received Chemotherapy Date Acquired: 11/09/2018 11/29/2018 N/A Weeks of Treatment: 2 2 N/A Wound Status: Open Open N/A Measurements L x W x D 3.7x2x2.2 1.3x1.8x0.1 N/A (cm) Area (cm) : 5.812 1.838 N/A Volume (cm) : 12.786 0.184 N/A % Reduction in Area: 8.60% 49.10% N/A % Reduction in Volume: -5.80% 49.00% N/A Position 1 (  o'clock): 3 Maximum Distance 1 (cm): 2.5 Position 2 (o'clock): 5 Maximum Distance 2 (cm): 3.2 Position 3 (o'clock): 12 Maximum Distance 3 (cm): 2 Starting Position 1 12 (o'clock): Ending Position 1 12 (o'clock): Maximum Distance 1 (cm): 3.2 Tunneling: Yes No N/A Undermining: Yes No N/A Classification: Category/Stage IV Unstageable/Unclassified N/A Safran, Jamala [name]. ([id number]) Exudate Amount: Medium None Present N/A Exudate Type: Purulent N/A N/A Exudate Color: yellow, brown, green N/A N/A Wound Margin: Flat and Intact Indistinct, nonvisible N/A Granulation Amount: Large (67-100%) None Present (0%) N/A Granulation Quality: Pink, Pale N/A N/A Necrotic Amount: Small (1-33%) Large (67-100%) N/A Necrotic Tissue: Adherent Slough Eschar N/A Exposed Structures: Fascia: Yes Fascia: No N/A Fat Layer (Subcutaneous Fat Layer (Subcutaneous Tissue) Exposed: Yes Tissue) Exposed: No Tendon: No Tendon: No Muscle: No Muscle: No Joint: No Joint: No Bone: No Bone: No Limited to Skin Breakdown Epithelialization: None Large (67-100%) N/A Treatment Notes Wound #1 (Midline Sacrum) Notes Sacrum: Prism Ag, wet to dry, BFD; Foot: Betadine, BRD Wound #2 (Left Calcaneus) Notes Sacrum: Prism Ag, wet to dry, BFD; Foot: Betadine,  BRD Electronic Signature(s) Signed: 01/14/2019 7:47:21 AM By: Linton Ham MD Entered By: Linton Ham on 01/12/2019 10:59:00 [name], [name] ([id number]) -------------------------------------------------------------------------------- [place] Details Patient Name: [name]. Date of Service: 01/12/2019 9:30 AM Medical Record Number: [id number] Patient Account Number: [id number] Date of Birth/Sex: [birth year] (85 y.o. F) Treating RN: [name] Primary Care Evana Runnels: [name] Other Clinician: Referring Namish Krise: [name] Treating Salimatou Simone/Extender: [name] in Treatment: 2 Active Inactive Abuse / Safety / Falls / Self Care Management Nursing Diagnoses: Abuse or neglect; actual or potential Potential for falls Goals: Patient will remain injury free related to falls Date Initiated: 12/29/2018 Target Resolution Date: 01/05/2019 Goal Status: Active Interventions: Assess fall risk on admission and as needed Notes: Medication Nursing Diagnoses: Knowledge deficit related to medication safety: actual or potential Goals: Patient/caregiver will demonstrate understanding of all current medications Date Initiated: 12/29/2018 Target Resolution Date: 01/05/2019 Goal Status: Active Interventions: Assess for medication contraindications each visit where new medications are prescribed Provide education on medication safety Notes: Necrotic Tissue Nursing Diagnoses: Impaired tissue integrity related to necrotic/devitalized tissue Goals: Necrotic/devitalized tissue will be minimized in the wound bed Date Initiated: 12/29/2018 Target Resolution Date: 01/05/2019 Goal Status: Active [name], [name] ([id number]) Interventions: Assess patient pain level pre-, during and post procedure and prior to discharge Treatment Activities: Apply topical anesthetic as ordered : 12/29/2018 Notes: Orientation to the Wound Care  Program Nursing Diagnoses: Knowledge deficit related to the wound healing center program Goals: Patient/caregiver will verbalize understanding of the [facility] Date Initiated: 12/29/2018 Target Resolution Date: 01/05/2019 Goal Status: Active Interventions: Provide education on orientation to the wound center Notes: Pressure Nursing Diagnoses: Potential for impaired tissue integrity related to pressure, friction, moisture, and shear Goals: Patient will remain free of pressure ulcers Date Initiated: 12/29/2018 Target Resolution Date: 01/05/2019 Goal Status: Active Interventions: Assess: immobility, friction, shearing, incontinence upon admission and as needed Notes: Wound/Skin Impairment Nursing Diagnoses: Impaired tissue integrity Goals: Ulcer/skin breakdown will have a volume reduction of 30% by week 4 Date Initiated: 12/29/2018 Target Resolution Date: 01/29/2019 Goal Status: Active Interventions: Assess patient/caregiver ability to obtain necessary supplies Notes: [name], [name] ([id number]) Electronic Signature(s) Signed: 01/13/2019 5:19:38 PM By: [name], BSN, RN, CWS, [name] Entered By: [name], BSN, RN, [name] on 01/12/2019 10:00:39 [name] ([id number]) -------------------------------------------------------------------------------- Pain Assessment Details Patient Name: [name]. Date of Service: 01/12/2019  9:30 AM Medical Record Number: [id number] Patient Account Number: [id number] Date of Birth/Sex: 1926-11-06 (85 y.o. F) Treating RN: [name] Primary Care Merari Pion: [name] Other Clinician: Referring Taino Maertens: [name] Treating Vihan Santagata/Extender: [name] in Treatment: 2 Active Problems Location of Pain Severity and Description of Pain Patient Has Paino Yes Site Locations Rate the pain. Current Pain Level: 8 Pain Management and Medication Current Pain  Management: Electronic Signature(s) Signed: 01/13/2019 4:15:06 PM By: [name] Entered By: [name] on 01/12/2019 09:32:47 [name], [name] ([id number]) -------------------------------------------------------------------------------- Patient/Caregiver Education Details Patient Name: [name]. Date of Service: 01/12/2019 9:30 AM Medical Record Number: [id number] Patient Account Number: [id number] Date of Birth/Gender: 1927/01/08 (85 y.o. F) Treating RN: [name] Primary Care Physician: [name] Other Clinician: Referring Physician: Myrtie [name] Treating Physician/Extender: [name] in Treatment: 2 Education Assessment Education Provided To: Patient Education Topics Provided Wound/Skin Impairment: Handouts: Caring for Your Ulcer Methods: Demonstration, Explain/Verbal Responses: State content correctly Electronic Signature(s) Signed: 01/13/2019 5:19:38 PM By: [name], BSN, RN, CWS, [name] Entered By: [name], BSN, RN, [name] on 01/12/2019 10:04:00 [name] ([id number]) -------------------------------------------------------------------------------- Wound Assessment Details Patient Name: [name]. Date of Service: 01/12/2019 9:30 AM Medical Record Number: [id number] Patient Account Number: [id number] Date of Birth/Sex: [birth year] (85 y.o. F) Treating RN: [name] Primary Care Kemi Gell: [name] Other Clinician: Referring Irva Loser: [name] Treating Ethelyn Cerniglia/Extender: [name] in Treatment: 2 Wound Status Wound Number: 1 Primary Pressure Ulcer Etiology: Wound Location: Sacrum - Midline Wound Open Wounding Event: Gradually Appeared Status: Date Acquired: 11/09/2018 Comorbid Hypertension, Type II Diabetes, Osteoarthritis, Weeks Of Treatment: 2 History: Received Chemotherapy Clustered Wound: No Photos Wound Measurements Length: (cm) 3.7 % Reduction in  A Width: (cm) 2 % Reduction in V Depth: (cm) 2.2 Epithelializatio Area: (cm) 5.812 Tunneling: Volume: (cm) 12.786 Location 1 Position ( Maximum Di Location 2 Position ( Maximum Di Location 3 Position ( Maximum Di rea: 8.6% olume: -5.8% n: None Yes o'clock): 3 stance: (cm) 2.5 o'clock): 5 stance: (cm) 3.2 o'clock): 12 stance: (cm) 2 Undermining: Yes Starting Position (o'clock): 12 Ending Position (o'clock): 12 Maximum Distance: (cm) 3.2 Wound Description Classification: Category/Stage IV Foul Odor After Wound Margin: Flat and Intact Slough/Fibrino Exudate Amount: Medium [name], [name]. ([id number]) Cleansing: No Yes Exudate Type: Purulent Exudate Color: yellow, brown, green Wound Bed Granulation Amount: Large (67-100%) Exposed Structure Granulation Quality: Pink, Pale Fascia Exposed: Yes Necrotic Amount: Small (1-33%) Fat Layer (Subcutaneous Tissue) Exposed: Yes Necrotic Quality: Adherent Slough Tendon Exposed: No Muscle Exposed: No Joint Exposed: No Bone Exposed: No Treatment Notes Wound #1 (Midline Sacrum) Notes Sacrum: Prism Ag, wet to dry, BFD; Foot: Betadine, BRD Electronic Signature(s) Signed: 01/13/2019 4:15:06 PM By: [name] Entered By: [name] on 01/12/2019 09:43:54 Wollman, Joniah [name]. ([id number]) -------------------------------------------------------------------------------- Wound Assessment Details Patient Name: [name]. Date of Service: 01/12/2019 9:30 AM Medical Record Number: [id number] Patient Account Number: [id number] Date of Birth/Sex: 12-11-1926 (85 y.o. F) Treating RN: [name] Primary Care Devon Kingdon: [name] Other Clinician: Referring Olamide Carattini: [name] Treating Emmagene Ortner/Extender: [name] in Treatment: 2 Wound Status Wound Number: 2 Primary Pressure Ulcer Etiology: Wound Location: Left Calcaneus Wound Open Wounding Event: Pressure Injury Status: Date  Acquired: 11/29/2018 Comorbid Hypertension, Type II Diabetes, Osteoarthritis, Weeks Of Treatment: 2 History: Received Chemotherapy Clustered Wound: No Photos Wound Measurements Length: (cm) 1.3 % Reduction i Width: (cm) 1.8 % Reduction i Depth: (cm) 0.1 Epithelializa Area: (cm) 1.838  Tunneling: Volume: (cm) 0.184 Undermining: n Area: 49.1% n Volume: 49% tion: Large (67-100%) No No Wound Description Classification: Unstageable/Unclassified Foul Odor Af Wound Margin: Indistinct, nonvisible Slough/Fibri Exudate Amount: None Present ter Cleansing: No no No Wound Bed Granulation Amount: None Present (0%) Exposed Structure Necrotic Amount: Large (67-100%) Fascia Exposed: No Necrotic Quality: Eschar Fat Layer (Subcutaneous Tissue) Exposed: No Tendon Exposed: No Muscle Exposed: No Joint Exposed: No Bone Exposed: No Limited to Skin Breakdown Treatment Notes Wound #2 (Left Calcaneus) Trant, Idelle [name]. ([id number]) Notes Sacrum: Prism Ag, wet to dry, BFD; Foot: Betadine, BRD Electronic Signature(s) Signed: 01/13/2019 4:15:06 PM By: [name] Entered By: [name] on 01/12/2019 09:44:19 Minella, Alean [name]. ([id number]) -------------------------------------------------------------------------------- Vitals Details Patient Name: [name]. Date of Service: 01/12/2019 9:30 AM Medical Record Number: [id number] Patient Account Number: [id number] Date of Birth/Sex: [birth year] (85 y.o. F) Treating RN: [name] Primary Care Shirlene Andaya: [name] Other Clinician: Referring Jesper Stirewalt: [name] Treating Ellakate Gonsalves/Extender: [name] in Treatment: 2 Vital Signs Time Taken: 09:30 Temperature (F): 97.9 Height (in): 63 Pulse (bpm): 83 Weight (lbs): 135 Respiratory Rate (breaths/min): 16 Body Mass Index (BMI): 23.9 Blood Pressure (mmHg): 102/84 Reference Range: 80 - 120 mg / dl Electronic Signature(s) Signed: 01/13/2019  4:15:06 PM By: [name] Entered By: [name] on 01/12/2019 09:33:40

## 2019-01-14 NOTE — Progress Notes (Signed)
EVALET, LUTTRULL ([id number]) Visit Report for 01/12/2019 HPI Details Patient Name: [name], [name]. Date of Service: 01/12/2019 9:30 AM Medical Record Number: [id number] Patient Account Number: [id number] Date of Birth/Sex: [birth year] (85 y.o. F) Treating RN: Cornell Barman Primary Care Provider: Myrtie Hawk Other Clinician: Referring Provider: Myrtie Hawk Treating Provider/Extender: Tito Dine in Treatment: 2 History of Present Illness HPI Description: ADMISSION 12/29/2018 Patient is a frail 85 year old woman who is a resident of [facility]. She is here accompanied by friend. According the history she apparently had a right hip fracture sometime earlier this year and went to the nursing home for recovery. She was supposed to return home however she was rehospitalized on 10/5 with a left hip fracture requiring an ORIF. It was noted during this hospitalization she had a stage IV pressure ulcer using wet-to-dry. She was seen at that hospitalization by palliative care but I do not think that is been followed in the facility. A CT scan of her lumbar spine did not show any acute osseous abnormalities but that may have not shown the wound areas. She also has an area on her left heel the history behind this is a little less certain. Looking through her records her albumin was normal in August 2020 at 3.8. Past medical history; hypertension, hypothyroidism, seborrheic keratosis, lumbar spinal stenosis, gait instability, left hip fracture on 10/5, breast CA on the left apparently also uterine cancer. Type 2 diabetes that is diabetic controlled, hyperlipidemia and bilateral hip surgery ABIs in our clinic were noncompressible on the right and 1.25 on the left 11/4; frail woman at [facility]. She has a stage IV sacral wound and an area on the tip of her left heel. We have been using Santyl wet-to-dry on the sacrum Betadine and foam on the heel. She arrives today in  follow-up. Electronic Signature(s) Signed: 01/14/2019 7:47:21 AM By: Linton Ham MD Entered By: Linton Ham on 01/12/2019 11:00:02 Pillard, [name] ([id number]) -------------------------------------------------------------------------------- Physical Exam Details Patient Name: Stacy Reeds B. Date of Service: 01/12/2019 9:30 AM Medical Record Number: [id number] Patient Account Number: [id number] Date of Birth/Sex: 08/04/1926 (85 y.o. F) Treating RN: Cornell Barman Primary Care Provider: Myrtie Hawk Other Clinician: Referring Provider: Myrtie Hawk Treating Provider/Extender: Ricard Dillon Weeks in Treatment: 2 Constitutional Sitting or standing Blood Pressure is within target range for patient.. Pulse regular and within target range for patient.Marland Kitchen Respirations regular, non-labored and within target range.. Temperature is normal and within the target range for the patient.Marland Kitchen appears in no distress. Eyes Conjunctivae clear. No discharge. Respiratory Respiratory effort is easy and symmetric bilaterally. Rate is normal at rest and on room air.. Cardiovascular She does not look dehydrated. Genitourinary (GU) Foley catheter. Integumentary (Hair, Skin) There is some erythema that looks almost candidal around the wound and small sections.Marland Kitchen Psychiatric Patient appears depressed today.. Notes Wound exam; the patient has a stage IV wound over the lower sacrum. Significant undermining from 12-6 o'clock. However there is no palpable bone. Although there is some irritation around the wound there is tenderness although I am not sure whether there is active infection here or not. No crepitus is noted. Electronic Signature(s) Signed: 01/14/2019 7:47:21 AM By: Linton Ham MD Entered By: Linton Ham on 01/12/2019 11:01:55 Gehring, [name] ([id number]) -------------------------------------------------------------------------------- Physician Orders Details Patient  Name: Stacy Coder. Date of Service: 01/12/2019 9:30 AM Medical Record Number: [id number] Patient Account Number: [id number] Date of Birth/Sex: 1926-10-30 (85 y.o. F) Treating RN: Cornell Barman Primary Care Provider: Myrtie Hawk  Other Clinician: Referring Provider: Myrtie Hawk Treating Provider/Extender: Tito Dine in Treatment: 2 Verbal / Phone Orders: No Diagnosis Coding Wound Cleansing Wound #1 Midline Sacrum o Clean wound with Normal Saline. o May Shower, gently pat wound dry prior to applying new dressing. Wound #2 Left Calcaneus o Clean wound with Normal Saline. o May Shower, gently pat wound dry prior to applying new dressing. Anesthetic (add to Medication List) Wound #1 Midline Sacrum o Topical Lidocaine 4% cream applied to wound bed prior to debridement (In Clinic Only). Wound #2 Left Calcaneus o Topical Lidocaine 4% cream applied to wound bed prior to debridement (In Clinic Only). Primary Wound Dressing Wound #1 Midline Sacrum o Silver Collagen - Packed lightly into wound Wound #2 Left Calcaneus o Other: - betadine Secondary Dressing Wound #1 Midline Sacrum o Boardered Foam Dressing Wound #2 Left Calcaneus o Boardered Foam Dressing Dressing Change Frequency Wound #1 Midline Sacrum o Change dressing every day. Wound #2 Left Calcaneus o Change dressing every day. Follow-up Appointments Wound #1 Midline Sacrum o Return Appointment in 2 weeks. Wound #2 Left Calcaneus o Return Appointment in 2 weeks. [name], [name] ([id number]) Off-Loading Wound #1 Midline Sacrum o Roho cushion for wheelchair - Patient in wheelchair only for meals and therapy. o Turn and reposition every 2 hours o Other: - Keep pressure off of heels in the bed and in the wheelchair. Patient in wheelchair only for meals and therapy. Wound #2 Left Calcaneus o Roho cushion for wheelchair - Patient in wheelchair only for meals and  therapy. o Turn and reposition every 2 hours o Other: - Keep pressure off of heels in the bed and in the wheelchair. Patient in wheelchair only for meals and therapy. Medications-please add to medication list. Wound #1 Midline Sacrum o Santyl Enzymatic Ointment Electronic Signature(s) Signed: 01/13/2019 5:19:38 PM By: Gretta Cool, BSN, RN, CWS, Kim RN, BSN Signed: 01/14/2019 7:47:21 AM By: Linton Ham MD Entered By: Gretta Cool, BSN, RN, CWS, Kim on 01/12/2019 10:02:41 Wessling, [name] ([id number]) -------------------------------------------------------------------------------- Problem List Details Patient Name: [name], [name] Date of Service: 01/12/2019 9:30 AM Medical Record Number: [id number] Patient Account Number: [id number] Date of Birth/Sex: [birth year] (85 y.o. F) Treating RN: Cornell Barman Primary Care Provider: Myrtie Hawk Other Clinician: Referring Provider: Myrtie Hawk Treating Provider/Extender: Tito Dine in Treatment: 2 Active Problems ICD-10 Evaluated Encounter Code Description Active Date Today Diagnosis L89.154 Pressure ulcer of sacral region, stage 4 12/29/2018 No Yes L89.620 Pressure ulcer of left heel, unstageable 12/29/2018 No Yes Inactive Problems Resolved Problems Electronic Signature(s) Signed: 01/14/2019 7:47:21 AM By: Linton Ham MD Entered By: Linton Ham on 01/12/2019 10:58:51 Wirz, [name] ([id number]) -------------------------------------------------------------------------------- Progress Note Details Patient Name: Stacy Reeds B. Date of Service: 01/12/2019 9:30 AM Medical Record Number: [id number] Patient Account Number: [id number] Date of Birth/Sex: 02/27/1927 (85 y.o. F) Treating RN: Cornell Barman Primary Care Provider: Myrtie Hawk Other Clinician: Referring Provider: Myrtie Hawk Treating Provider/Extender: Tito Dine in Treatment: 2 Subjective History of Present Illness  (HPI) ADMISSION 12/29/2018 Patient is a frail 85 year old woman who is a resident of [facility]. She is here accompanied by friend. According the history she apparently had a right hip fracture sometime earlier this year and went to the nursing home for recovery. She was supposed to return home however she was rehospitalized on 10/5 with a left hip fracture requiring an ORIF. It was noted during this hospitalization she had a stage IV pressure ulcer using wet-to-dry. She was seen at  that hospitalization by palliative care but I do not think that is been followed in the facility. A CT scan of her lumbar spine did not show any acute osseous abnormalities but that may have not shown the wound areas. She also has an area on her left heel the history behind this is a little less certain. Looking through her records her albumin was normal in August 2020 at 3.8. Past medical history; hypertension, hypothyroidism, seborrheic keratosis, lumbar spinal stenosis, gait instability, left hip fracture on 10/5, breast CA on the left apparently also uterine cancer. Type 2 diabetes that is diabetic controlled, hyperlipidemia and bilateral hip surgery ABIs in our clinic were noncompressible on the right and 1.25 on the left 11/4; frail woman at [facility]. She has a stage IV sacral wound and an area on the tip of her left heel. We have been using Santyl wet-to-dry on the sacrum Betadine and foam on the heel. She arrives today in follow-up. Objective Constitutional Sitting or standing Blood Pressure is within target range for patient.. Pulse regular and within target range for patient.Marland Kitchen Respirations regular, non-labored and within target range.. Temperature is normal and within the target range for the patient.Marland Kitchen appears in no distress. Vitals Time Taken: 9:30 AM, Height: 63 in, Weight: 135 lbs, BMI: 23.9, Temperature: 97.9 F, Pulse: 83 bpm, Respiratory Rate: 16 breaths/min, Blood Pressure: 102/84  mmHg. Eyes Conjunctivae clear. No discharge. Respiratory Respiratory effort is easy and symmetric bilaterally. Rate is normal at rest and on room air.. Cardiovascular She does not look dehydrated. [name], [name] ([id number]) Genitourinary (GU) Foley catheter. Psychiatric Patient appears depressed today.. General Notes: Wound exam; the patient has a stage IV wound over the lower sacrum. Significant undermining from 12-6 o'clock. However there is no palpable bone. Although there is some irritation around the wound there is tenderness although I am not sure whether there is active infection here or not. No crepitus is noted. Integumentary (Hair, Skin) There is some erythema that looks almost candidal around the wound and small sections.. Wound #1 status is Open. Original cause of wound was Gradually Appeared. The wound is located on the Midline Sacrum. The wound measures 3.7cm length x 2cm width x 2.2cm depth; 5.812cm^2 area and 12.786cm^3 volume. There is Fat Layer (Subcutaneous Tissue) Exposed and fascia exposed. Tunneling has been noted at 3:00 with a maximum distance of 2.5cm. There is additional tunneling at 5:00 with a maximum distance of 3.2cm, and at 12:00 with a maximum distance of 2cm. Undermining begins at 12:00 and ends at 12:00 with a maximum distance of 3.2cm. There is a medium amount of purulent drainage noted. The wound margin is flat and intact. There is large (67-100%) pink, pale granulation within the wound bed. There is a small (1-33%) amount of necrotic tissue within the wound bed including Adherent Slough. Wound #2 status is Open. Original cause of wound was Pressure Injury. The wound is located on the Left Calcaneus. The wound measures 1.3cm length x 1.8cm width x 0.1cm depth; 1.838cm^2 area and 0.184cm^3 volume. The wound is limited to skin breakdown. There is no tunneling or undermining noted. There is a none present amount of drainage noted. The wound margin is  indistinct and nonvisible. There is no granulation within the wound bed. There is a large (67-100%) amount of necrotic tissue within the wound bed including Eschar. Assessment Active Problems ICD-10 Pressure ulcer of sacral region, stage 4 Pressure ulcer of left heel, unstageable Plan Wound Cleansing: Wound #1 Midline Sacrum: Clean  wound with Normal Saline. May Shower, gently pat wound dry prior to applying new dressing. Wound #2 Left Calcaneus: Clean wound with Normal Saline. May Shower, gently pat wound dry prior to applying new dressing. Anesthetic (add to Medication List): Wound #1 Midline Sacrum: Topical Lidocaine 4% cream applied to wound bed prior to debridement (In Clinic Only). Wound #2 Left Calcaneus: [name], [name] ([id number]) Topical Lidocaine 4% cream applied to wound bed prior to debridement (In Clinic Only). Primary Wound Dressing: Wound #1 Midline Sacrum: Silver Collagen - Packed lightly into wound Wound #2 Left Calcaneus: Other: - betadine Secondary Dressing: Wound #1 Midline Sacrum: Boardered Foam Dressing Wound #2 Left Calcaneus: Boardered Foam Dressing Dressing Change Frequency: Wound #1 Midline Sacrum: Change dressing every day. Wound #2 Left Calcaneus: Change dressing every day. Follow-up Appointments: Wound #1 Midline Sacrum: Return Appointment in 2 weeks. Wound #2 Left Calcaneus: Return Appointment in 2 weeks. Off-Loading: Wound #1 Midline Sacrum: Roho cushion for wheelchair - Patient in wheelchair only for meals and therapy. Turn and reposition every 2 hours Other: - Keep pressure off of heels in the bed and in the wheelchair. Patient in wheelchair only for meals and therapy. Wound #2 Left Calcaneus: Roho cushion for wheelchair - Patient in wheelchair only for meals and therapy. Turn and reposition every 2 hours Other: - Keep pressure off of heels in the bed and in the wheelchair. Patient in wheelchair only for meals and  therapy. Medications-please add to medication list.: Wound #1 Midline Sacrum: Santyl Enzymatic Ointment 1. I change the primary dressing to the sacrum to silver collagen with the packing wet-to-dry. 2. Although this ideally looks like a wound VAC candidate I wonder whether she is going to be able to tolerate this. 3. I really was not certain enough to put her on empiric antibiotics but I did a culture of the superior part of the wound 4. The area on the left heel should be ready for debridement the next time she is here. I do not believe she has an arterial issue. 5. May need to consider imaging studies of the sacral wound 6. I am uncertain about her nutritional status Electronic Signature(s) Signed: 01/14/2019 7:47:21 AM By: Linton Ham MD Entered By: Linton Ham on 01/12/2019 11:04:55 [name], [name] ([id number]) -------------------------------------------------------------------------------- SuperBill Details Patient Name: Stacy Reeds B. Date of Service: 01/12/2019 Medical Record Number: [id number] Patient Account Number: [id number] Date of Birth/Sex: Oct 18, 1926 (85 y.o. F) Treating RN: Cornell Barman Primary Care Provider: Myrtie Hawk Other Clinician: Referring Provider: Myrtie Hawk Treating Provider/Extender: Ricard Dillon Weeks in Treatment: 2 Diagnosis Coding ICD-10 Codes Code Description L89.154 Pressure ulcer of sacral region, stage 4 L89.620 Pressure ulcer of left heel, unstageable Facility Procedures CPT4 Code: [id number] Description: 99214 - WOUND CARE VISIT-LEV 4 EST PT Modifier: Quantity: 1 Physician Procedures CPT4 Code: [id number] Description: [id number] - WC PHYS LEVEL 3 - EST PT ICD-10 Diagnosis Description L89.154 Pressure ulcer of sacral region, stage 4 L89.620 Pressure ulcer of left heel, unstageable Modifier: Quantity: 1 Electronic Signature(s) Signed: 01/14/2019 7:47:21 AM By: Linton Ham MD Entered By: Linton Ham on  01/12/2019 11:05:11

## 2019-01-15 LAB — AEROBIC CULTURE W GRAM STAIN (SUPERFICIAL SPECIMEN)

## 2019-01-26 ENCOUNTER — Encounter: Payer: Medicare Other | Admitting: Internal Medicine

## 2019-01-26 ENCOUNTER — Other Ambulatory Visit: Payer: Self-pay | Admitting: *Deleted

## 2019-01-26 ENCOUNTER — Other Ambulatory Visit: Payer: Self-pay

## 2019-01-26 DIAGNOSIS — Z8781 Personal history of (healed) traumatic fracture: Secondary | ICD-10-CM | POA: Diagnosis not present

## 2019-01-26 DIAGNOSIS — I10 Essential (primary) hypertension: Secondary | ICD-10-CM | POA: Diagnosis not present

## 2019-01-26 DIAGNOSIS — L89620 Pressure ulcer of left heel, unstageable: Secondary | ICD-10-CM | POA: Diagnosis not present

## 2019-01-26 DIAGNOSIS — L821 Other seborrheic keratosis: Secondary | ICD-10-CM | POA: Diagnosis not present

## 2019-01-26 DIAGNOSIS — E039 Hypothyroidism, unspecified: Secondary | ICD-10-CM | POA: Diagnosis not present

## 2019-01-26 DIAGNOSIS — L89154 Pressure ulcer of sacral region, stage 4: Secondary | ICD-10-CM | POA: Diagnosis not present

## 2019-01-26 NOTE — Patient Outreach (Signed)
Member assessed for potential [facility] Care Management needs as a benefit of [place] Medicare.  Mrs. Brisbane is currently receiving rehab therapy at [facility].   Telephone call made to her son Jenasia Monastero to discuss [place] Management follow up. Patient identifiers confirmed.   [name] confirms that the disposition plan is for home with hospice.   No identifiable [facility] Care Management needs at this time.   Marthenia Rolling, MSN-Ed, RN,BSN [place] Acute Care Coordinator [phone] [facility]) [phone]  (Toll free office)

## 2019-01-27 NOTE — Progress Notes (Signed)
Stacy, [name] ([id number]) Visit Report for 01/26/2019 Debridement Details Patient Name: [name], [name]. Date of Service: 01/26/2019 11:00 AM Medical Record Number: [id number] Patient Account Number: [id number] Date of Birth/Sex: 06-19-1926 (85 y.o. F) Treating RN: Cornell Barman Primary Care Provider: Myrtie Hawk Other Clinician: Referring Provider: Myrtie Hawk Treating Provider/Extender: Tito Dine in Treatment: 4 Debridement Performed for Wound #2 Left Calcaneus Assessment: Performed By: Physician Ricard Dillon, MD Debridement Type: Debridement Level of Consciousness (Pre- Awake and Alert procedure): Pre-procedure Verification/Time Yes - 11:29 Out Taken: Start Time: 11:29 Pain Control: Lidocaine Total Area Debrided (L x W): 1.4 (cm) x 1.2 (cm) = 1.68 (cm) Tissue and other material Eschar, Subcutaneous debrided: Level: Skin/Subcutaneous Tissue Debridement Description: Excisional Instrument: Curette Bleeding: Minimum Hemostasis Achieved: Pressure End Time: 11:31 Response to Treatment: Procedure was tolerated well Level of Consciousness Awake and Alert (Post-procedure): Post Debridement Measurements of Total Wound Length: (cm) 1.4 Stage: Unstageable/Unclassified Width: (cm) 1.2 Depth: (cm) 0.4 Volume: (cm) 0.528 Character of Wound/Ulcer Post Stable Debridement: Post Procedure Diagnosis Same as Pre-procedure Electronic Signature(s) Signed: 01/26/2019 5:09:09 PM By: Linton Ham MD Signed: 01/27/2019 1:15:06 PM By: Gretta Cool, BSN, RN, CWS, Kim RN, BSN Entered By: Linton Ham on 01/26/2019 11:47:32 [place]oto, [place]. ([id number]) -------------------------------------------------------------------------------- HPI Details Patient Name: Stacy Reeds B. Date of Service: 01/26/2019 11:00 AM Medical Record Number: [id number] Patient Account Number: [id number] Date of Birth/Sex: [birth year] (85 y.o. F) Treating RN: Cornell Barman Primary Care Provider: Myrtie Hawk Other Clinician: Referring Provider: Myrtie Hawk Treating Provider/Extender: Tito Dine in Treatment: 4 History of Present Illness HPI Description: ADMISSION 12/29/2018 Patient is a frail 85 year old woman who is a resident of [facility]. She is here accompanied by friend. According the history she apparently had a right hip fracture sometime earlier this year and went to the nursing home for recovery. She was supposed to return home however she was rehospitalized on 10/5 with a left hip fracture requiring an ORIF. It was noted during this hospitalization she had a stage IV pressure ulcer using wet-to-dry. She was seen at that hospitalization by palliative care but I do not think that is been followed in the facility. A CT scan of her lumbar spine did not show any acute osseous abnormalities but that may have not shown the wound areas. She also has an area on her left heel the history behind this is a little less certain. Looking through her records her albumin was normal in August 2020 at 3.8. Past medical history; hypertension, hypothyroidism, seborrheic keratosis, lumbar spinal stenosis, gait instability, left hip fracture on 10/5, breast CA on the left apparently also uterine cancer. Type 2 diabetes that is diabetic controlled, hyperlipidemia and bilateral hip surgery ABIs in our clinic were noncompressible on the right and 1.25 on the left 11/4; frail woman at [facility]. She has a stage IV sacral wound and an area on the tip of her left heel. We have been using Santyl wet-to-dry on the sacrum Betadine and foam on the heel. She arrives today in follow-up. 11/18; 2-week follow-up. Patient has a stage IV sacral wound we have been using silver collagen packed with normal saline wet-to-dry and an area on the tip of the left heel that is covered with a thick black eschar. She has had [facility]  but according to her friend she is going to be discharged on Friday to home. Apparently the patient has refused to remain in the facility. There are many social issues with the  patient going home. Apparently hospice has become involved Electronic Signature(s) Signed: 01/26/2019 5:09:09 PM By: Linton Ham MD Entered By: Linton Ham on 01/26/2019 11:49:33 Kloc, [name] ([id number]) -------------------------------------------------------------------------------- Physical Exam Details Patient Name: Stacy Reeds B. Date of Service: 01/26/2019 11:00 AM Medical Record Number: [id number] Patient Account Number: [id number] Date of Birth/Sex: [birth year] (85 y.o. F) Treating RN: Cornell Barman Primary Care Provider: Myrtie Hawk Other Clinician: Referring Provider: Myrtie Hawk Treating Provider/Extender: Ricard Dillon Weeks in Treatment: 4 Constitutional Sitting or standing Blood Pressure is within target range for patient.. Pulse regular and within target range for patient.Marland Kitchen Respirations regular, non-labored and within target range.. Temperature is normal and within the target range for the patient.Marland Kitchen appears in no distress. Eyes Conjunctivae clear. No discharge. Respiratory Respiratory effort is easy and symmetric bilaterally. Rate is normal at rest and on room air.. Cardiovascular Pedal pulses are palpable at the dorsalis pedis and posterior tibial. Integumentary (Hair, Skin) No erythema around either wound. Psychiatric Patient appears depressed today. However she appears to be cognitively intact at least superficially. Notes Wound exam; the patient has a stage IV wound over the lower sacrum there is significant tunneling especially inferiorly. There is no palpable bone although this is a deep wound. The granulation tissue looks healthy however. There is no surrounding erythema and no crepitus. oThe area on the tip of the left heel has adherent black eschar  that I removed with a #5 curette. Her pedal pulses are palpable leading me to believe that debridement was possible Electronic Signature(s) Signed: 01/26/2019 5:09:09 PM By: Linton Ham MD Entered By: Linton Ham on 01/26/2019 11:54:12 Arp, [name] ([id number]) -------------------------------------------------------------------------------- Physician Orders Details Patient Name: Stacy Reeds B. Date of Service: 01/26/2019 11:00 AM Medical Record Number: [id number] Patient Account Number: [id number] Date of Birth/Sex: August 29, 1926 (85 y.o. F) Treating RN: Cornell Barman Primary Care Provider: Myrtie Hawk Other Clinician: Referring Provider: Myrtie Hawk Treating Provider/Extender: Tito Dine in Treatment: 4 Verbal / Phone Orders: No Diagnosis Coding Wound Cleansing Wound #1 Midline Sacrum o Clean wound with Normal Saline. o May Shower, gently pat wound dry prior to applying new dressing. Wound #2 Left Calcaneus o Clean wound with Normal Saline. o May Shower, gently pat wound dry prior to applying new dressing. Anesthetic (add to Medication List) Wound #1 Midline Sacrum o Topical Lidocaine 4% cream applied to wound bed prior to debridement (In Clinic Only). Wound #2 Left Calcaneus o Topical Lidocaine 4% cream applied to wound bed prior to debridement (In Clinic Only). Primary Wound Dressing Wound #1 Midline Sacrum o Silver Collagen - Packed lightly into wound Wound #2 Left Calcaneus o Silver Collagen - Packed lightly into wound Secondary Dressing Wound #1 Midline Sacrum o Boardered Foam Dressing Wound #2 Left Calcaneus o Boardered Foam Dressing Dressing Change Frequency Wound #1 Midline Sacrum o Change dressing every other day. Wound #2 Left Calcaneus o Change dressing every other day. Follow-up Appointments Wound #1 Midline Sacrum o Return Appointment in 2 weeks. Wound #2 Left Calcaneus o Return  Appointment in 2 weeks. Cowley, Suzana B. ([id number]) Off-Loading Wound #1 Midline Sacrum o Roho cushion for wheelchair - Patient in wheelchair only for meals and therapy. o Turn and reposition every 2 hours o Other: - Keep pressure off of heels in the bed and in the wheelchair. Patient in wheelchair only for meals and therapy. Wound #2 Left Calcaneus o Roho cushion for wheelchair - Patient in wheelchair only for meals and therapy. o Turn and reposition every 2  hours o Other: - Keep pressure off of heels in the bed and in the wheelchair. Patient in wheelchair only for meals and therapy. Electronic Signature(s) Signed: 01/26/2019 5:09:09 PM By: Linton Ham MD Signed: 01/27/2019 1:15:06 PM By: Gretta Cool, BSN, RN, CWS, Kim RN, BSN Entered By: Gretta Cool, BSN, RN, CWS, Kim on 01/26/2019 11:36:01 Klebba, [name] ([id number]) -------------------------------------------------------------------------------- Problem List Details Patient Name: GLYNNA, MOHAMUD B. Date of Service: 01/26/2019 11:00 AM Medical Record Number: [id number] Patient Account Number: [id number] Date of Birth/Sex: 22-Jul-1926 (85 y.o. F) Treating RN: Cornell Barman Primary Care Provider: Myrtie Hawk Other Clinician: Referring Provider: Myrtie Hawk Treating Provider/Extender: Tito Dine in Treatment: 4 Active Problems ICD-10 Evaluated Encounter Code Description Active Date Today Diagnosis L89.154 Pressure ulcer of sacral region, stage 4 12/29/2018 No Yes L89.620 Pressure ulcer of left heel, unstageable 12/29/2018 No Yes Inactive Problems Resolved Problems Electronic Signature(s) Signed: 01/26/2019 5:09:09 PM By: Linton Ham MD Entered By: Linton Ham on 01/26/2019 11:46:40 Hodgman, Chele B. ([id number]) -------------------------------------------------------------------------------- Progress Note Details Patient Name: Stacy Reeds B. Date of Service: 01/26/2019 11:00  AM Medical Record Number: [id number] Patient Account Number: [id number] Date of Birth/Sex: 1926/09/26 (85 y.o. F) Treating RN: Cornell Barman Primary Care Provider: Myrtie Hawk Other Clinician: Referring Provider: Myrtie Hawk Treating Provider/Extender: Tito Dine in Treatment: 4 Subjective History of Present Illness (HPI) ADMISSION 12/29/2018 Patient is a frail 85 year old woman who is a resident of [facility]. She is here accompanied by friend. According the history she apparently had a right hip fracture sometime earlier this year and went to the nursing home for recovery. She was supposed to return home however she was rehospitalized on 10/5 with a left hip fracture requiring an ORIF. It was noted during this hospitalization she had a stage IV pressure ulcer using wet-to-dry. She was seen at that hospitalization by palliative care but I do not think that is been followed in the facility. A CT scan of her lumbar spine did not show any acute osseous abnormalities but that may have not shown the wound areas. She also has an area on her left heel the history behind this is a little less certain. Looking through her records her albumin was normal in August 2020 at 3.8. Past medical history; hypertension, hypothyroidism, seborrheic keratosis, lumbar spinal stenosis, gait instability, left hip fracture on 10/5, breast CA on the left apparently also uterine cancer. Type 2 diabetes that is diabetic controlled, hyperlipidemia and bilateral hip surgery ABIs in our clinic were noncompressible on the right and 1.25 on the left 11/4; frail woman at [facility]. She has a stage IV sacral wound and an area on the tip of her left heel. We have been using Santyl wet-to-dry on the sacrum Betadine and foam on the heel. She arrives today in follow-up. 11/18; 2-week follow-up. Patient has a stage IV sacral wound we have been using silver collagen packed with normal  saline wet-to-dry and an area on the tip of the left heel that is covered with a thick black eschar. She has had [facility] but according to her friend she is going to be discharged on Friday to home. Apparently the patient has refused to remain in the facility. There are many social issues with the patient going home. Apparently hospice has become involved Objective Constitutional Sitting or standing Blood Pressure is within target range for patient.. Pulse regular and within target range for patient.Marland Kitchen Respirations regular, non-labored and within target range.. Temperature is normal and within the target  range for the patient.Marland Kitchen appears in no distress. Vitals Time Taken: 11:15 AM, Height: 63 in, Weight: 135 lbs, BMI: 23.9, Temperature: 98.2 F, Pulse: 77 bpm, Respiratory Rate: 16 breaths/min, Blood Pressure: 114/69 mmHg. Eyes Conjunctivae clear. No discharge. Respiratory Buehl, [place] ([id number]) Respiratory effort is easy and symmetric bilaterally. Rate is normal at rest and on room air.. Cardiovascular Pedal pulses are palpable at the dorsalis pedis and posterior tibial. Psychiatric Patient appears depressed today. However she appears to be cognitively intact at least superficially. General Notes: Wound exam; the patient has a stage IV wound over the lower sacrum there is significant tunneling especially inferiorly. There is no palpable bone although this is a deep wound. The granulation tissue looks healthy however. There is no surrounding erythema and no crepitus. The area on the tip of the left heel has adherent black eschar that I removed with a #5 curette. Her pedal pulses are palpable leading me to believe that debridement was possible Integumentary (Hair, Skin) No erythema around either wound. Wound #1 status is Open. Original cause of wound was Gradually Appeared. The wound is located on the Midline Sacrum. The wound measures 2.5cm length x 1.8cm width x 1.6cm depth;  3.534cm^2 area and 5.655cm^3 volume. There is Fat Layer (Subcutaneous Tissue) Exposed and fascia exposed. Tunneling has been noted at 12:00 with a maximum distance of 1.3cm. There is additional tunneling and at 6:00 with a maximum distance of 1.5cm. Undermining begins at 12:00 and ends at 6:00 with a maximum distance of 1.5cm. There is a medium amount of purulent drainage noted. The wound margin is flat and intact. There is large (67-100%) pink, pale granulation within the wound bed. There is a small (1-33%) amount of necrotic tissue within the wound bed including Adherent Slough. Wound #2 status is Open. Original cause of wound was Pressure Injury. The wound is located on the Left Calcaneus. The wound measures 1.4cm length x 1.2cm width x 0.1cm depth; 1.319cm^2 area and 0.132cm^3 volume. The wound is limited to skin breakdown. There is no tunneling or undermining noted. There is a none present amount of drainage noted. The wound margin is indistinct and nonvisible. There is no granulation within the wound bed. There is a large (67-100%) amount of necrotic tissue within the wound bed including Eschar. Assessment Active Problems ICD-10 Pressure ulcer of sacral region, stage 4 Pressure ulcer of left heel, unstageable Procedures Wound #2 Pre-procedure diagnosis of Wound #2 is a Pressure Ulcer located on the Left Calcaneus . There was a Excisional Skin/Subcutaneous Tissue Debridement with a total area of 1.68 sq cm performed by Ricard Dillon, MD. With the following instrument(s): Curette Material removed includes Eschar and Subcutaneous Tissue and after achieving pain control using Lidocaine. No specimens were taken. A time out was conducted at 11:29, prior to the start of the procedure. A Minimum amount of bleeding was controlled with Pressure. The procedure was tolerated well. Post Debridement Measurements: 1.4cm length x 1.2cm width x 0.4cm depth; 0.528cm^3 volume. Post debridement Stage  noted as Unstageable/Unclassified. Character of Wound/Ulcer Post Debridement is stable. Post procedure Diagnosis Wound #2: Same as Pre-Procedure Rasmusson, Cruzita B. ([id number]) Plan Wound Cleansing: Wound #1 Midline Sacrum: Clean wound with Normal Saline. May Shower, gently pat wound dry prior to applying new dressing. Wound #2 Left Calcaneus: Clean wound with Normal Saline. May Shower, gently pat wound dry prior to applying new dressing. Anesthetic (add to Medication List): Wound #1 Midline Sacrum: Topical Lidocaine 4% cream applied to wound bed prior  to debridement (In Clinic Only). Wound #2 Left Calcaneus: Topical Lidocaine 4% cream applied to wound bed prior to debridement (In Clinic Only). Primary Wound Dressing: Wound #1 Midline Sacrum: Silver Collagen - Packed lightly into wound Wound #2 Left Calcaneus: Silver Collagen - Packed lightly into wound Secondary Dressing: Wound #1 Midline Sacrum: Boardered Foam Dressing Wound #2 Left Calcaneus: Boardered Foam Dressing Dressing Change Frequency: Wound #1 Midline Sacrum: Change dressing every other day. Wound #2 Left Calcaneus: Change dressing every other day. Follow-up Appointments: Wound #1 Midline Sacrum: Return Appointment in 2 weeks. Wound #2 Left Calcaneus: Return Appointment in 2 weeks. Off-Loading: Wound #1 Midline Sacrum: Roho cushion for wheelchair - Patient in wheelchair only for meals and therapy. Turn and reposition every 2 hours Other: - Keep pressure off of heels in the bed and in the wheelchair. Patient in wheelchair only for meals and therapy. Wound #2 Left Calcaneus: Roho cushion for wheelchair - Patient in wheelchair only for meals and therapy. Turn and reposition every 2 hours Other: - Keep pressure off of heels in the bed and in the wheelchair. Patient in wheelchair only for meals and therapy. 1. At this point I think we continue with silver collagen with packing wet-to-dry on the sacrum. In another  situation I would consider a wound VAC after imaging to exclude osteomyelitis however if the patient is going to be on hospice care when she is discharged from the nursing home then this simply would not be a practical thought. If the situation changes will revisit this 2. Debridement of the left heel was done. I remove the black eschar and some debris was able to get to a reasonable surface. I do not think this is a vascular issue Mathisen, Stanley B. ([id number]) 3. I will have her back in 2 weeks assuming a lot of things including that she will be on hospice, that the social situation settles etc. Electronic Signature(s) Signed: 01/26/2019 5:09:09 PM By: Linton Ham MD Entered By: Linton Ham on 01/26/2019 11:57:16 Schermer, [name] ([id number]) -------------------------------------------------------------------------------- [place] Details Patient Name: Stacy Reeds B. Date of Service: 01/26/2019 Medical Record Number: [id number] Patient Account Number: [id number] Date of Birth/Sex: December 09, 1926 (85 y.o. F) Treating RN: Cornell Barman Primary Care Provider: Myrtie Hawk Other Clinician: Referring Provider: Myrtie Hawk Treating Provider/Extender: Tito Dine in Treatment: 4 Diagnosis Coding ICD-10 Codes Code Description L89.154 Pressure ulcer of sacral region, stage 4 L89.620 Pressure ulcer of left heel, unstageable Facility Procedures CPT4 Code: [id number] Description: [id number] - DEB SUBQ TISSUE 20 SQ CM/< ICD-10 Diagnosis Description L89.620 Pressure ulcer of left heel, unstageable Modifier: Quantity: 1 Physician Procedures CPT4 Code: [id number] Description: [id number] - WC PHYS SUBQ TISS 20 SQ CM ICD-10 Diagnosis Description L89.620 Pressure ulcer of left heel, unstageable Modifier: Quantity: 1 Electronic Signature(s) Signed: 01/26/2019 5:09:09 PM By: Linton Ham MD Entered By: Linton Ham on 01/26/2019 11:57:39

## 2019-01-27 NOTE — Progress Notes (Addendum)
[name], [name] ([id number]) Visit Report for 01/26/2019 Arrival Information Details Patient Name: [name], [name]. Date of Service: 01/26/2019 11:00 AM Medical Record Number: [id number] Patient Account Number: [id number] Date of Birth/Sex: Jul 06, 1926 (85 y.o. F) Treating RN: Army Melia Primary Care [name]: Myrtie Hawk Other Clinician: Referring Satoya Feeley: Myrtie Hawk Treating Mikaelyn Arthurs/Extender: Tito Dine in Treatment: 4 Visit Information History Since Last Visit Added or deleted any medications: No Patient Arrived: Wheel Chair Any new allergies or adverse reactions: No Arrival Time: 11:14 Had a fall or experienced change in No Accompanied By: family activities of daily living that may affect Transfer Assistance: Harrel Lemon Lift risk of falls: Patient Identification Verified: Yes Signs or symptoms of abuse/neglect since last visito No Patient Has Alerts: Yes Hospitalized since last visit: No Patient Alerts: Patient on Blood Thinner Has Dressing in Place as Prescribed: Yes DMII Pain Present Now: Yes Lovenox Electronic Signature(s) Signed: 01/26/2019 4:09:40 PM By: Army Melia Entered By: Army Melia on 01/26/2019 11:14:48 [name], [name] ([id number]) -------------------------------------------------------------------------------- Encounter Discharge Information Details Patient Name: Stacy Reeds B. Date of Service: 01/26/2019 11:00 AM Medical Record Number: [id number] Patient Account Number: [id number] Date of Birth/Sex: Jan 10, 1927 (85 y.o. F) Treating RN: Cornell Barman Primary Care Taliesin Hartlage: Myrtie Hawk Other Clinician: Referring Kamren Heskett: Myrtie Hawk Treating Eyla Tallon/Extender: Tito Dine in Treatment: 4 Encounter Discharge Information Items Post Procedure Vitals Discharge Condition: Stable Temperature (F): 98.2 Ambulatory Status: Wheelchair Pulse (bpm): 77 Discharge Destination: [place] Respiratory Rate (breaths/min): 16 Orders Sent: Yes Blood Pressure (mmHg): 114/69 Transportation: Private Auto Accompanied By: self Schedule Follow-up Appointment: Yes Clinical Summary of Care: Electronic Signature(s) Signed: 01/27/2019 1:15:06 PM By: Gretta Cool, BSN, RN, CWS, Kim RN, BSN Entered By: Gretta Cool, BSN, RN, CWS, Kim on 01/26/2019 11:39:09 [name], [name] ([id number]) -------------------------------------------------------------------------------- Lower Extremity Assessment Details Patient Name: [name], [name] Date of Service: 01/26/2019 11:00 AM Medical Record Number: [id number] Patient Account Number: [id number] Date of Birth/Sex: 1926/10/26 (85 y.o. F) Treating RN: Army Melia Primary Care Lamond Glantz: Myrtie Hawk Other Clinician: Referring Aysha Livecchi: Myrtie Hawk Treating Berta Denson/Extender: Ricard Dillon Weeks in Treatment: 4 Edema Assessment Assessed: [Left: No] [Right: No] Edema: [Left: N] [Right: o] Vascular Assessment Pulses: Dorsalis Pedis Palpable: [Right:Yes] Electronic Signature(s) Signed: 01/26/2019 4:09:40 PM By: Army Melia Entered By: Army Melia on 01/26/2019 11:19:29 [name], [name] ([id number]) -------------------------------------------------------------------------------- Multi Wound Chart Details Patient Name: Stacy Reeds B. Date of Service: 01/26/2019 11:00 AM Medical Record Number: [id number] Patient Account Number: [id number] Date of Birth/Sex: 1927-01-21 (85 y.o. F) Treating RN: Cornell Barman Primary Care Jakalyn Kratky: Myrtie Hawk Other Clinician: Referring Averey Trompeter: Myrtie Hawk Treating Luie Laneve/Extender: Tito Dine in Treatment: 4 Vital Signs Height(in): 63 Pulse(bpm): 92 Weight(lbs): 135 Blood Pressure(mmHg): 114/69 Body Mass Index(BMI): 24 Temperature(F): 98.2 Respiratory Rate 16 (breaths/min): Photos: [N/A:N/A] Wound Location: Sacrum - Midline Left Calcaneus  N/A Wounding Event: Gradually Appeared Pressure Injury N/A Primary Etiology: Pressure Ulcer Pressure Ulcer N/A Comorbid History: Hypertension, Type II Hypertension, Type II N/A Diabetes, Osteoarthritis, Diabetes, Osteoarthritis, Received Chemotherapy Received Chemotherapy Date Acquired: 11/09/2018 11/29/2018 N/A Weeks of Treatment: 4 4 N/A Wound Status: Open Open N/A Measurements L x W x D 2.5x1.8x1.6 1.4x1.2x0.1 N/A (cm) Area (cm) : 3.534 1.319 N/A Volume (cm) : 5.655 0.132 N/A % Reduction in Area: 44.50% 63.50% N/A % Reduction in Volume: 53.20% 63.40% N/A Position 1 (o'clock): 12 Maximum Distance 1 (cm): 1.3 Position 2 (o'clock): 6 Maximum Distance 2 (cm): 1.5 Starting Position 1 12 (o'clock): Ending Position 1 6 (o'clock): Maximum Distance  1 (cm): 1.5 Tunneling: Yes No N/A Undermining: Yes No N/A Classification: Category/Stage IV Unstageable/Unclassified N/A Exudate Amount: Medium None Present N/A Exudate Type: Purulent N/A N/A [name], [name] ([id number]) Exudate Color: yellow, brown, green N/A N/A Wound Margin: Flat and Intact Indistinct, nonvisible N/A Granulation Amount: Large (67-100%) None Present (0%) N/A Granulation Quality: Pink, Pale N/A N/A Necrotic Amount: Small (1-33%) Large (67-100%) N/A Necrotic Tissue: Adherent Slough Eschar N/A Exposed Structures: Fascia: Yes Fascia: No N/A Fat Layer (Subcutaneous Fat Layer (Subcutaneous Tissue) Exposed: Yes Tissue) Exposed: No Tendon: No Tendon: No Muscle: No Muscle: No Joint: No Joint: No Bone: No Bone: No Limited to Skin Breakdown Epithelialization: None Large (67-100%) N/A Debridement: N/A Debridement - Excisional N/A Pre-procedure N/A 11:29 N/A Verification/Time Out Taken: Pain Control: N/A Lidocaine N/A Tissue Debrided: N/A Necrotic/Eschar, N/A Subcutaneous Level: N/A Skin/Subcutaneous Tissue N/A Debridement Area (sq cm): N/A 1.68 N/A Instrument: N/A Curette N/A Bleeding: N/A Minimum  N/A Hemostasis Achieved: N/A Pressure N/A Debridement Treatment N/A Procedure was tolerated well N/A Response: Post Debridement N/A 1.4x1.2x0.4 N/A Measurements L x W x D (cm) Post Debridement Volume: N/A 0.528 N/A (cm) Post Debridement Stage: N/A Unstageable/Unclassified N/A Procedures Performed: N/A Debridement N/A Treatment Notes Wound #1 (Midline Sacrum) Notes Prisma Ag, Gauze, BFD for both wounds Wound #2 (Left Calcaneus) Notes Prisma Ag, Gauze, BFD for both wounds Electronic Signature(s) Signed: 01/26/2019 5:09:09 PM By: Linton Ham MD Entered By: Linton Ham on 01/26/2019 11:47:14 Wilbert, [name] ([id number]) -------------------------------------------------------------------------------- [place] Details Patient Name: [name]. Date of Service: 01/26/2019 11:00 AM Medical Record Number: [id number] Patient Account Number: [id number] Date of Birth/Sex: February 27, 1927 (85 y.o. F) Treating RN: Cornell Barman Primary Care Harlea Goetzinger: Myrtie Hawk Other Clinician: Referring Noah Pelaez: Myrtie Hawk Treating Areyana Leoni/Extender: Tito Dine in Treatment: 4 Active Inactive Electronic Signature(s) Signed: 03/14/2019 12:30:38 PM By: Gretta Cool, BSN, RN, CWS, Kim RN, BSN Previous Signature: 01/27/2019 1:15:06 PM Version By: Gretta Cool, BSN, RN, CWS, Kim RN, BSN Entered By: Gretta Cool, BSN, RN, CWS, Kim on 03/14/2019 12:30:37 [name] ([id number]) -------------------------------------------------------------------------------- Pain Assessment Details Patient Name: Stacy Reeds B. Date of Service: 01/26/2019 11:00 AM Medical Record Number: [id number] Patient Account Number: [id number] Date of Birth/Sex: Jan 06, 1927 (85 y.o. F) Treating RN: Army Melia Primary Care Macarthur Lorusso: Myrtie Hawk Other Clinician: Referring Carlitos Bottino: Myrtie Hawk Treating Naiomi Musto/Extender: Tito Dine in Treatment: 4 Active  Problems Location of Pain Severity and Description of Pain Patient Has Paino Yes Site Locations Pain Location: Pain in Ulcers Rate the pain. Current Pain Level: 8 Pain Management and Medication Current Pain Management: Electronic Signature(s) Signed: 01/26/2019 4:09:40 PM By: Army Melia Entered By: Army Melia on 01/26/2019 11:15:24 Bollard, [name] ([id number]) -------------------------------------------------------------------------------- Patient/Caregiver Education Details Patient Name: [name]. Date of Service: 01/26/2019 11:00 AM Medical Record Number: [id number] Patient Account Number: [id number] Date of Birth/Gender: Nov 04, 1926 (85 y.o. F) Treating RN: Cornell Barman Primary Care Physician: Myrtie Hawk Other Clinician: Referring Physician: Myrtie Hawk Treating Physician/Extender: Tito Dine in Treatment: 4 Education Assessment Education Provided To: Patient Education Topics Provided Wound/Skin Impairment: Handouts: Caring for Your Ulcer Methods: Demonstration, Explain/Verbal Responses: State content correctly Electronic Signature(s) Signed: 01/27/2019 1:15:06 PM By: Gretta Cool, BSN, RN, CWS, Kim RN, BSN Entered By: Gretta Cool, BSN, RN, CWS, Kim on 01/26/2019 11:37:01 Gan, [name] ([id number]) -------------------------------------------------------------------------------- Wound Assessment Details Patient Name: Stacy Reeds B. Date of Service: 01/26/2019 11:00 AM Medical Record Number: [id number] Patient Account Number: [id number] Date of Birth/Sex: 10-07-1926 (85 y.o. F) Treating RN: Nicki Reaper,  Toyah Primary Care Ardean Simonich: Myrtie Hawk Other Clinician: Referring Juliano Mceachin: Myrtie Hawk Treating Orine Goga/Extender: Tito Dine in Treatment: 4 Wound Status Wound Number: 1 Primary Pressure Ulcer Etiology: Wound Location: Sacrum - Midline Wound Open Wounding Event: Gradually Appeared Status: Date  Acquired: 11/09/2018 Comorbid Hypertension, Type II Diabetes, Osteoarthritis, Weeks Of Treatment: 4 History: Received Chemotherapy Clustered Wound: No Photos Wound Measurements Length: (cm) 2.5 % Reduction in Width: (cm) 1.8 % Reduction in Depth: (cm) 1.6 Epithelializati Area: (cm) 3.534 Tunneling: Volume: (cm) 5.655 Location 1 Position Maximum D Location 2 Position Maximum D Area: 44.5% Volume: 53.2% on: None Yes (o'clock): 12 istance: (cm) 1.3 (o'clock): 6 istance: (cm) 1.5 Undermining: Yes Starting Position (o'clock): 12 Ending Position (o'clock): 6 Maximum Distance: (cm) 1.5 Wound Description Classification: Category/Stage IV Foul Odor After Wound Margin: Flat and Intact Slough/Fibrino Exudate Amount: Medium Exudate Type: Purulent Exudate Color: yellow, brown, green Ogletree, Emmaclaire B. ([id number]) Cleansing: No Yes Wound Bed Granulation Amount: Large (67-100%) Exposed Structure Granulation Quality: Pink, Pale Fascia Exposed: Yes Necrotic Amount: Small (1-33%) Fat Layer (Subcutaneous Tissue) Exposed: Yes Necrotic Quality: Adherent Slough Tendon Exposed: No Muscle Exposed: No Joint Exposed: No Bone Exposed: No Electronic Signature(s) Signed: 01/26/2019 4:09:40 PM By: Army Melia Entered By: Army Melia on 01/26/2019 11:18:38 Whyte, Janyth B. ([id number]) -------------------------------------------------------------------------------- Wound Assessment Details Patient Name: Stacy Reeds B. Date of Service: 01/26/2019 11:00 AM Medical Record Number: [id number] Patient Account Number: [id number] Date of Birth/Sex: 25-Oct-1926 (85 y.o. F) Treating RN: Army Melia Primary Care Serrina Minogue: Myrtie Hawk Other Clinician: Referring Sultana Tierney: Myrtie Hawk Treating Gitel Beste/Extender: Tito Dine in Treatment: 4 Wound Status Wound Number: 2 Primary Pressure Ulcer Etiology: Wound Location: Left Calcaneus Wound Open Wounding Event:  Pressure Injury Status: Date Acquired: 11/29/2018 Comorbid Hypertension, Type II Diabetes, Osteoarthritis, Weeks Of Treatment: 4 History: Received Chemotherapy Clustered Wound: No Photos Wound Measurements Length: (cm) 1.4 % Reduction i Width: (cm) 1.2 % Reduction i Depth: (cm) 0.1 Epithelializa Area: (cm) 1.319 Tunneling: Volume: (cm) 0.132 Undermining: n Area: 63.5% n Volume: 63.4% tion: Large (67-100%) No No Wound Description Classification: Unstageable/Unclassified Foul Odor Af Wound Margin: Indistinct, nonvisible Slough/Fibri Exudate Amount: None Present ter Cleansing: No no No Wound Bed Granulation Amount: None Present (0%) Exposed Structure Necrotic Amount: Large (67-100%) Fascia Exposed: No Necrotic Quality: Eschar Fat Layer (Subcutaneous Tissue) Exposed: No Tendon Exposed: No Muscle Exposed: No Joint Exposed: No Bone Exposed: No Limited to Skin Breakdown Electronic Signature(s) Signed: 01/26/2019 4:09:40 PM By: Nita Sickle, [name] ([id number]) Entered By: Army Melia on 01/26/2019 11:19:15 Tarter, Shary B. ([id number]) -------------------------------------------------------------------------------- Vitals Details Patient Name: Stacy Reeds B. Date of Service: 01/26/2019 11:00 AM Medical Record Number: [id number] Patient Account Number: [id number] Date of Birth/Sex: 1926-06-18 (85 y.o. F) Treating RN: Army Melia Primary Care Jadier Rockers: Myrtie Hawk Other Clinician: Referring Harshil Cavallaro: Myrtie Hawk Treating Joziah Dollins/Extender: Tito Dine in Treatment: 4 Vital Signs Time Taken: 11:15 Temperature (F): 98.2 Height (in): 63 Pulse (bpm): 77 Weight (lbs): 135 Respiratory Rate (breaths/min): 16 Body Mass Index (BMI): 23.9 Blood Pressure (mmHg): 114/69 Reference Range: 80 - 120 mg / dl Electronic Signature(s) Signed: 01/26/2019 4:09:40 PM By: Army Melia Entered By: Army Melia on 01/26/2019 11:15:50

## 2019-01-28 DIAGNOSIS — R279 Unspecified lack of coordination: Secondary | ICD-10-CM | POA: Diagnosis not present

## 2019-01-28 DIAGNOSIS — Z743 Need for continuous supervision: Secondary | ICD-10-CM | POA: Diagnosis not present

## 2019-01-28 DIAGNOSIS — R0902 Hypoxemia: Secondary | ICD-10-CM | POA: Diagnosis not present

## 2019-01-30 DIAGNOSIS — Z8542 Personal history of malignant neoplasm of other parts of uterus: Secondary | ICD-10-CM | POA: Diagnosis not present

## 2019-01-30 DIAGNOSIS — E1140 Type 2 diabetes mellitus with diabetic neuropathy, unspecified: Secondary | ICD-10-CM | POA: Diagnosis not present

## 2019-01-30 DIAGNOSIS — L89620 Pressure ulcer of left heel, unstageable: Secondary | ICD-10-CM | POA: Diagnosis not present

## 2019-01-30 DIAGNOSIS — Z853 Personal history of malignant neoplasm of breast: Secondary | ICD-10-CM | POA: Diagnosis not present

## 2019-01-30 DIAGNOSIS — I10 Essential (primary) hypertension: Secondary | ICD-10-CM | POA: Diagnosis not present

## 2019-01-30 DIAGNOSIS — M48061 Spinal stenosis, lumbar region without neurogenic claudication: Secondary | ICD-10-CM | POA: Diagnosis not present

## 2019-01-30 DIAGNOSIS — M1990 Unspecified osteoarthritis, unspecified site: Secondary | ICD-10-CM | POA: Diagnosis not present

## 2019-01-30 DIAGNOSIS — E039 Hypothyroidism, unspecified: Secondary | ICD-10-CM | POA: Diagnosis not present

## 2019-01-30 DIAGNOSIS — H3530 Unspecified macular degeneration: Secondary | ICD-10-CM | POA: Diagnosis not present

## 2019-01-30 DIAGNOSIS — L89154 Pressure ulcer of sacral region, stage 4: Secondary | ICD-10-CM | POA: Diagnosis not present

## 2019-01-30 DIAGNOSIS — E559 Vitamin D deficiency, unspecified: Secondary | ICD-10-CM | POA: Diagnosis not present

## 2019-01-30 DIAGNOSIS — F419 Anxiety disorder, unspecified: Secondary | ICD-10-CM | POA: Diagnosis not present

## 2019-01-30 DIAGNOSIS — E785 Hyperlipidemia, unspecified: Secondary | ICD-10-CM | POA: Diagnosis not present

## 2019-02-02 ENCOUNTER — Inpatient Hospital Stay (HOSPITAL_COMMUNITY)
Admission: EM | Admit: 2019-02-02 | Discharge: 2019-02-09 | DRG: 698 | Disposition: A | Discharge status: Skilled Nursing Facility | Payer: Medicare Other | Attending: Internal Medicine | Admitting: Internal Medicine

## 2019-02-02 ENCOUNTER — Emergency Department (HOSPITAL_COMMUNITY): Payer: Medicare Other

## 2019-02-02 ENCOUNTER — Other Ambulatory Visit: Payer: Self-pay

## 2019-02-02 ENCOUNTER — Encounter (HOSPITAL_COMMUNITY): Payer: Self-pay | Admitting: *Deleted

## 2019-02-02 DIAGNOSIS — S79911A Unspecified injury of right hip, initial encounter: Secondary | ICD-10-CM | POA: Diagnosis not present

## 2019-02-02 DIAGNOSIS — I10 Essential (primary) hypertension: Secondary | ICD-10-CM | POA: Diagnosis not present

## 2019-02-02 DIAGNOSIS — R296 Repeated falls: Secondary | ICD-10-CM

## 2019-02-02 DIAGNOSIS — N3000 Acute cystitis without hematuria: Secondary | ICD-10-CM

## 2019-02-02 DIAGNOSIS — Z20828 Contact with and (suspected) exposure to other viral communicable diseases: Secondary | ICD-10-CM | POA: Diagnosis present

## 2019-02-02 DIAGNOSIS — Y846 Urinary catheterization as the cause of abnormal reaction of the patient, or of later complication, without mention of misadventure at the time of the procedure: Secondary | ICD-10-CM | POA: Diagnosis present

## 2019-02-02 DIAGNOSIS — S199XXA Unspecified injury of neck, initial encounter: Secondary | ICD-10-CM | POA: Diagnosis not present

## 2019-02-02 DIAGNOSIS — M25551 Pain in right hip: Secondary | ICD-10-CM | POA: Diagnosis not present

## 2019-02-02 DIAGNOSIS — Z7982 Long term (current) use of aspirin: Secondary | ICD-10-CM

## 2019-02-02 DIAGNOSIS — Z66 Do not resuscitate: Secondary | ICD-10-CM | POA: Diagnosis present

## 2019-02-02 DIAGNOSIS — Z8542 Personal history of malignant neoplasm of other parts of uterus: Secondary | ICD-10-CM

## 2019-02-02 DIAGNOSIS — M25561 Pain in right knee: Secondary | ICD-10-CM | POA: Diagnosis not present

## 2019-02-02 DIAGNOSIS — S0990XA Unspecified injury of head, initial encounter: Secondary | ICD-10-CM | POA: Diagnosis not present

## 2019-02-02 DIAGNOSIS — E039 Hypothyroidism, unspecified: Secondary | ICD-10-CM | POA: Diagnosis present

## 2019-02-02 DIAGNOSIS — N390 Urinary tract infection, site not specified: Secondary | ICD-10-CM | POA: Diagnosis present

## 2019-02-02 DIAGNOSIS — Z79890 Hormone replacement therapy (postmenopausal): Secondary | ICD-10-CM

## 2019-02-02 DIAGNOSIS — I959 Hypotension, unspecified: Secondary | ICD-10-CM | POA: Diagnosis not present

## 2019-02-02 DIAGNOSIS — F03918 Unspecified dementia, unspecified severity, with other behavioral disturbance: Secondary | ICD-10-CM | POA: Diagnosis present

## 2019-02-02 DIAGNOSIS — B964 Proteus (mirabilis) (morganii) as the cause of diseases classified elsewhere: Secondary | ICD-10-CM | POA: Diagnosis present

## 2019-02-02 DIAGNOSIS — Z978 Presence of other specified devices: Secondary | ICD-10-CM

## 2019-02-02 DIAGNOSIS — Z79899 Other long term (current) drug therapy: Secondary | ICD-10-CM

## 2019-02-02 DIAGNOSIS — R0902 Hypoxemia: Secondary | ICD-10-CM | POA: Diagnosis not present

## 2019-02-02 DIAGNOSIS — M79605 Pain in left leg: Secondary | ICD-10-CM | POA: Diagnosis not present

## 2019-02-02 DIAGNOSIS — E785 Hyperlipidemia, unspecified: Secondary | ICD-10-CM | POA: Diagnosis present

## 2019-02-02 DIAGNOSIS — R52 Pain, unspecified: Secondary | ICD-10-CM | POA: Diagnosis not present

## 2019-02-02 DIAGNOSIS — S8992XA Unspecified injury of left lower leg, initial encounter: Secondary | ICD-10-CM | POA: Diagnosis not present

## 2019-02-02 DIAGNOSIS — Z9012 Acquired absence of left breast and nipple: Secondary | ICD-10-CM

## 2019-02-02 DIAGNOSIS — T83098A Other mechanical complication of other indwelling urethral catheter, initial encounter: Secondary | ICD-10-CM | POA: Diagnosis not present

## 2019-02-02 DIAGNOSIS — S3992XA Unspecified injury of lower back, initial encounter: Secondary | ICD-10-CM | POA: Diagnosis not present

## 2019-02-02 DIAGNOSIS — Z90710 Acquired absence of both cervix and uterus: Secondary | ICD-10-CM

## 2019-02-02 DIAGNOSIS — D649 Anemia, unspecified: Secondary | ICD-10-CM | POA: Diagnosis present

## 2019-02-02 DIAGNOSIS — M545 Low back pain: Secondary | ICD-10-CM | POA: Diagnosis not present

## 2019-02-02 DIAGNOSIS — S72141A Displaced intertrochanteric fracture of right femur, initial encounter for closed fracture: Secondary | ICD-10-CM | POA: Diagnosis not present

## 2019-02-02 DIAGNOSIS — S79922A Unspecified injury of left thigh, initial encounter: Secondary | ICD-10-CM | POA: Diagnosis not present

## 2019-02-02 DIAGNOSIS — Z609 Problem related to social environment, unspecified: Secondary | ICD-10-CM

## 2019-02-02 DIAGNOSIS — W19XXXA Unspecified fall, initial encounter: Secondary | ICD-10-CM

## 2019-02-02 DIAGNOSIS — Z03818 Encounter for observation for suspected exposure to other biological agents ruled out: Secondary | ICD-10-CM | POA: Diagnosis not present

## 2019-02-02 DIAGNOSIS — B9620 Unspecified Escherichia coli [E. coli] as the cause of diseases classified elsewhere: Secondary | ICD-10-CM | POA: Diagnosis present

## 2019-02-02 DIAGNOSIS — L89154 Pressure ulcer of sacral region, stage 4: Secondary | ICD-10-CM | POA: Diagnosis present

## 2019-02-02 DIAGNOSIS — F0391 Unspecified dementia with behavioral disturbance: Secondary | ICD-10-CM | POA: Diagnosis present

## 2019-02-02 DIAGNOSIS — M25552 Pain in left hip: Secondary | ICD-10-CM | POA: Diagnosis not present

## 2019-02-02 DIAGNOSIS — T83518A Infection and inflammatory reaction due to other urinary catheter, initial encounter: Principal | ICD-10-CM | POA: Diagnosis present

## 2019-02-02 DIAGNOSIS — I491 Atrial premature depolarization: Secondary | ICD-10-CM | POA: Diagnosis not present

## 2019-02-02 DIAGNOSIS — S72002A Fracture of unspecified part of neck of left femur, initial encounter for closed fracture: Secondary | ICD-10-CM | POA: Diagnosis not present

## 2019-02-02 DIAGNOSIS — Z7401 Bed confinement status: Secondary | ICD-10-CM

## 2019-02-02 DIAGNOSIS — M25562 Pain in left knee: Secondary | ICD-10-CM | POA: Diagnosis not present

## 2019-02-02 LAB — CBC WITH DIFFERENTIAL/PLATELET
Abs Immature Granulocytes: 0.1 10*3/uL — ABNORMAL HIGH (ref 0.00–0.07)
Basophils Absolute: 0.1 10*3/uL (ref 0.0–0.1)
Basophils Relative: 0 %
Eosinophils Absolute: 0 10*3/uL (ref 0.0–0.5)
Eosinophils Relative: 0 %
HCT: 37.2 % (ref 36.0–46.0)
Hemoglobin: 11.7 g/dL — ABNORMAL LOW (ref 12.0–15.0)
Immature Granulocytes: 1 %
Lymphocytes Relative: 4 %
Lymphs Abs: 0.8 10*3/uL (ref 0.7–4.0)
MCH: 29.4 pg (ref 26.0–34.0)
MCHC: 31.5 g/dL (ref 30.0–36.0)
MCV: 93.5 fL (ref 80.0–100.0)
Monocytes Absolute: 1.1 10*3/uL — ABNORMAL HIGH (ref 0.1–1.0)
Monocytes Relative: 6 %
Neutro Abs: 16.4 10*3/uL — ABNORMAL HIGH (ref 1.7–7.7)
Neutrophils Relative %: 89 %
Platelets: 445 10*3/uL — ABNORMAL HIGH (ref 150–400)
RBC: 3.98 MIL/uL (ref 3.87–5.11)
RDW: 16.5 % — ABNORMAL HIGH (ref 11.5–15.5)
WBC: 18.5 10*3/uL — ABNORMAL HIGH (ref 4.0–10.5)
nRBC: 0 % (ref 0.0–0.2)

## 2019-02-02 LAB — URINALYSIS, ROUTINE W REFLEX MICROSCOPIC
Bilirubin Urine: NEGATIVE
Glucose, UA: NEGATIVE mg/dL
Hgb urine dipstick: NEGATIVE
Ketones, ur: 5 mg/dL — AB
Nitrite: POSITIVE — AB
Protein, ur: 100 mg/dL — AB
Specific Gravity, Urine: 1.02 (ref 1.005–1.030)
WBC, UA: >50 WBC/hpf — ABNORMAL HIGH (ref 0–5)
pH: 5 (ref 5.0–8.0)

## 2019-02-02 LAB — COMPREHENSIVE METABOLIC PANEL
ALT: 10 U/L (ref 0–44)
AST: 23 U/L (ref 15–41)
Albumin: 2.7 g/dL — ABNORMAL LOW (ref 3.5–5.0)
Alkaline Phosphatase: 91 U/L (ref 38–126)
Anion gap: 10 (ref 5–15)
BUN: 26 mg/dL — ABNORMAL HIGH (ref 8–23)
CO2: 24 mmol/L (ref 22–32)
Calcium: 9.3 mg/dL (ref 8.9–10.3)
Chloride: 107 mmol/L (ref 98–111)
Creatinine, Ser: 0.98 mg/dL (ref 0.44–1.00)
GFR calc Af Amer: 59 mL/min — ABNORMAL LOW (ref >60)
GFR calc non Af Amer: 51 mL/min — ABNORMAL LOW (ref >60)
Glucose, Bld: 154 mg/dL — ABNORMAL HIGH (ref 70–99)
Potassium: 4.4 mmol/L (ref 3.5–5.1)
Sodium: 141 mmol/L (ref 135–145)
Total Bilirubin: 0.9 mg/dL (ref 0.3–1.2)
Total Protein: 6.9 g/dL (ref 6.5–8.1)

## 2019-02-02 LAB — CK: Total CK: 134 U/L (ref 38–234)

## 2019-02-02 MED ORDER — SODIUM CHLORIDE 0.9 % IV SOLN
1.0000 g | Freq: Once | INTRAVENOUS | Status: AC
  Administered 2019-02-03: 1 g via INTRAVENOUS
  Filled 2019-02-02: qty 10

## 2019-02-02 MED ORDER — SODIUM CHLORIDE 0.9 % IV BOLUS
1000.0000 mL | Freq: Once | INTRAVENOUS | Status: AC
  Administered 2019-02-03: 1000 mL via INTRAVENOUS

## 2019-02-02 NOTE — ED Notes (Signed)
Salena Saner, granddaughter looking for an update on patient [id number]

## 2019-02-02 NOTE — ED Provider Notes (Signed)
[facility] EMERGENCY DEPARTMENT Provider Note   CSN: [id number] Arrival date & time: 02/02/19  1934     History   Chief Complaint Chief Complaint  Patient presents with   Fall    HPI [name] is a 85 y.o. female with pertinent pmh of multiple falls, recent bilateral hip fractures, chronic sacral ulcer, urinary retention s/p foley catheter, anemia, dementia presents to ER for evaluation of unwitnessed fall.  History obtained primary from patient who is fully oriented to name, place, year and events. States she was in her bedroom by her bed when she tried to grab her rolling walker.  Gilford Rile was too heavy and she fell. She doesn't know how she landed or if she hit her head.  She is complaining of pain in both of her hips, both of her knees and groin but states both of her hips "are already broken".  Left leg slightly shortened upon arrival, EMS has tied sheet across pelvis.  Patient states she lives with her dog and her son [name] lives across the driveway from her. She denies any Cp, Sob, abdominal pain loss of sensation or tingling to her extremities.  Has been feeling well and at her baseline otherwise.  She has a sacral ulcer and states she sometimes puts medicines on it.  States she sometimes walks with her walker but sometimes she walks without it at home.  Per triage note, patient had a fall earlier today but refused transfer to ER.  Neighbors saw patient laying on floor and called 911. APS involved. Unclear if son was present at the home before transport.      HPI  Past Medical History:  Diagnosis Date   Anxiety 12/2002   Arthritis    hands   Cancer ([place])    breast left arm   Cancer ([place])    uterine   Diabetes mellitus 11/2004   Type 2   Dyspnea    WITH EXERTION   GERD (gastroesophageal reflux disease)    Hyperlipidemia 11/2004   Hypertension 12/2002   Hypothyroidism    Osteopenia 02/2002   02/2002 Dexa osteopenia// Dexa 02/26/2004  (Dr. 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 Ronnald Collum endocrinologist   Vertigo     Patient Active Problem List   Diagnosis Date Noted   Closed nondisplaced intertrochanteric fracture of right femur [facility])    Goals of care, counseling/discussion    Palliative care by specialist    DNR (do not resuscitate) discussion    Closed right hip fracture ([place]) 12/12/2018   Comminuted fracture of left hip ([place]) 10/27/2018   Osteoarthritis 03/12/2018   Overactive bladder 03/12/2018   Carotid artery stenosis 01/08/2018   Vitamin D deficiency 01/08/2018   Poor balance 10/16/2017   Fall at home 10/15/2017   Buttock pain 10/15/2017   Contusion, buttock 10/15/2017   Macular degeneration 03/20/2017   Thickened nails 03/20/2017   History of rectal bleeding 05/21/2016   Eczema 05/16/2014   Spinal stenosis of lumbar region 02/15/2014   Right foot pain 09/23/2013   Right shoulder pain 09/23/2013   Other screening mammogram 04/12/2013   Dizziness 05/21/2012   Falls frequently 05/21/2012   Urge incontinence 12/26/2011   Hip pain, bilateral 09/15/2011   Special screening for malignant neoplasms, colon 07/10/2010   Hypothyroidism 04/30/2010   CONSTIPATION 04/30/2010   STRESS REACTION, ACUTE, WITH EMOTIONAL DISTURBANCE 10/09/2009   Prediabetes 07/22/2009  Malignant neoplasm of female breast ([place]) 12/29/2007   Spinal stenosis, lumbar 06/22/2006   PLUMMER'S DISEASE 06/16/2006   Hyperlipidemia 06/16/2006   ANXIETY 06/16/2006   Essential hypertension 06/16/2006   Osteopenia 06/16/2006    Past Surgical History:  Procedure Laterality Date   ABDOMINAL HYSTERECTOMY  1990   endometrial adenoca   BREAST SURGERY  1980   left breast lumpectomy //abscess of breast evac.   CATARACT EXTRACTION W/PHACO Left 12/11/2016   Procedure: CATARACT EXTRACTION PHACO AND INTRAOCULAR LENS  PLACEMENT (IOC);  Surgeon: Eulogio Bear, MD;  Location: [facility] ORS;  Service: Ophthalmology;  Laterality: Left;  [place] 1:18.4 AP 36.6% CDE 16.13   CATARACT EXTRACTION W/PHACO Right 01/01/2017   Procedure: CATARACT EXTRACTION PHACO AND INTRAOCULAR LENS PLACEMENT (IOC);  Surgeon: Eulogio Bear, MD;  Location: [facility] ORS;  Service: Ophthalmology;  Laterality: Right;  [place]: 01:45.5 AP% 16.6 CDE 18.21 Fluid pack lot # [id number]   CHOLECYSTECTOMY  07/10/1987   EYE SURGERY     Tear Duct surgery, b/l cataract 2019    FEMUR IM NAIL Right 12/13/2018   Procedure: INTRAMEDULLARY (IM) NAIL FEMORAL, RIGHT;  Surgeon: Corky Mull, MD;  Location: [facility] ORS;  Service: Orthopedics;  Laterality: Right;   INTRAMEDULLARY (IM) NAIL INTERTROCHANTERIC Left 10/28/2018   Procedure: INTRAMEDULLARY (IM) NAIL INTERTROCHANTRIC LEFT LONG;  Surgeon: Hessie Knows, MD;  Location: [facility] ORS;  Service: Orthopedics;  Laterality: Left;   MASTECTOMY Left      OB History   No obstetric history on file.      Home Medications    Prior to Admission medications   Medication Sig Start Date End Date Taking? Authorizing Provider  aspirin 81 MG tablet Take 81 mg by mouth daily. Daily with food     [provider]  calcium carbonate (OS-CAL - DOSED IN MG OF ELEMENTAL CALCIUM) 1250 (500 Ca) MG tablet Take 1 tablet (500 mg of elemental calcium total) by mouth daily with breakfast. 12/16/18   Mayo, Pete Pelt, MD  Cholecalciferol 1.25 MG (50000 UT) capsule Take 1 capsule (50,000 Units total) by mouth once a week. 03/12/18   McLean-Scocuzza, Nino Glow, MD  docusate sodium (COLACE) 100 MG capsule Take 1 capsule (100 mg total) by mouth 2 (two) times daily as needed for mild constipation. 12/15/18   Mayo, Pete Pelt, MD  enoxaparin (LOVENOX) 40 MG/0.4ML injection Inject 0.4 mLs (40 mg total) into the skin daily for 14 days. 12/16/18 12/30/18  Mayo, Pete Pelt, MD  ferrous sulfate 325 (65 FE) MG tablet Take 1 tablet (325 mg total) by  mouth daily. 12/15/18 01/14/19  Mayo, Pete Pelt, MD  gabapentin (NEURONTIN) 100 MG capsule TAKE ONE (1) CAPSULE BY MOUTH 2 TIMES DAILY 03/12/18   McLean-Scocuzza, Nino Glow, MD  levothyroxine (SYNTHROID, LEVOTHROID) 50 MCG tablet Take 1 tablet (50 mcg total) by mouth daily. 03/12/18   McLean-Scocuzza, Nino Glow, MD  losartan (COZAAR) 100 MG tablet Take 1 tablet (100 mg total) by mouth daily. 11/01/18   Gladstone Lighter, MD  mometasone (ELOCON) 0.1 % cream Apply 1 application topically daily. To affected areas behind ears 03/26/17   Tower, Roque Lias A, MD  polyethylene glycol powder (GLYCOLAX/MIRALAX) powder Take 17 g by mouth daily as needed. As directed for constipation 03/26/17   Tower, Wynelle Fanny, MD  traMADol (ULTRAM) 50 MG tablet Take 1 tablet (50 mg total) by mouth every 6 (six) hours as needed for moderate pain. 12/15/18   Mayo, Pete Pelt, MD  vitamin C (ASCORBIC ACID) 500 MG  tablet Take 500 mg by mouth daily.    [provider]    Family History Family History  Problem Relation Age of Onset   Alzheimer's disease Mother    Heart disease Mother 9       angina   Hypertension Mother    Crohn's disease Mother        ? crohns disease   Cancer Father 16       colon cancer   Cancer Sister 46       breast cancer    COPD Sister        emphysema (smoker)   Heart disease Sister        angina   Alcohol abuse Brother    Diabetes Paternal Aunt    Hypertension Sister    Stroke Neg Hx     Social History Social History   Tobacco Use   Smoking status: Never Smoker   Smokeless tobacco: Never Used  Substance Use Topics   Alcohol use: No    Alcohol/week: 0.0 standard drinks   Drug use: No     Allergies   Amlodipine, Atenolol, Erythromycin base, and Lisinopril   Review of Systems Review of Systems  Unable to perform ROS: Dementia  Musculoskeletal: Positive for arthralgias, gait problem and joint swelling.  Skin: Positive for wound.  All other systems reviewed and are  negative.    Physical Exam Updated Vital Signs BP (!) 145/79 (BP Location: Right Arm)    Pulse 73    Temp 98.1 F (36.7 C) (Oral)    Resp 16    SpO2 100%   Physical Exam Vitals signs and nursing note reviewed.  Constitutional:      General: She is not in acute distress.    Appearance: She is well-developed.     Comments: Awake, alert, NAD. Cervical collar in place.   HENT:     Head: Normocephalic and atraumatic.     Comments: No facial, scalp bone tenderness, contusions or signs of trauma.     Right Ear: External ear normal.     Left Ear: External ear normal.     Nose: Nose normal.     Mouth/Throat:     Mouth: Mucous membranes are dry.     Comments: Dry lips and MM. Poor dentition throughout.  Several decayed teeth. No signs of acute intraoral or tongue injury. Eyes:     General: No scleral icterus.    Conjunctiva/sclera: Conjunctivae normal.     Comments: No periorbital ecchymosis. No periorbital contusion or tenderness.   Neck:     Musculoskeletal: Normal range of motion and neck supple.     Comments: c-spine: no midline or paraspinal muscle tenderness. Trachea midline. No A/L neck edema or signs of trauma  Cardiovascular:     Rate and Rhythm: Normal rate and regular rhythm.     Heart sounds: Normal heart sounds. No murmur.     Comments: 1+ radial and DP pulses bilaterally Pulmonary:     Effort: Pulmonary effort is normal.     Breath sounds: Normal breath sounds.  Abdominal:     General: Abdomen is flat.     Palpations: Abdomen is soft.     Tenderness: There is no abdominal tenderness.  Genitourinary:    Comments: Foley catheter in place, dark orange urine in bag. approx 4 cm sacral wound, malodorous but no bleeding, discharge. Minimally tender, with subcutaneous and questionable muscle tissue exposed.  Musculoskeletal: Normal range of motion.  General: Swelling, tenderness and deformity present.     Comments: TL spine: no midline or paraspinal muscle tenderness.  No ecchymosis to back Pelvis: blanket wrap around pelvis.  TTP to left and right iliac crests and greater trochanters R>L.  Pain with L compression but no significant instability. LLE is slightly shortened and IR.  Pain with LLE log roll. TTP left lateral proximal thigh with contusion.  Pain with RLE leg roll. Further hip ROM deferred due to possible pelvis injury and deformity of LLE. Lower extremities: diffuse anterior right knee tenderness with edema, decreased ROM.  Non tender L knee, ROM limited due to reported left hip pain.    Skin:    General: Skin is warm and dry.     Capillary Refill: Capillary refill takes less than 2 seconds.  Neurological:     Mental Status: She is alert and oriented to person, place, and time.     Comments: Alert and oriented to self, place, time and event.  Speech is fluent without dysarthria or dysphasia. Strength 5/5 with hand grip and ankle F/E.   Sensation to light touch intact in face, hands and feet. Gait not assessed.  No pronator drift. Leg drop not assessed. CN III-XII grossly intact bilaterally  Psychiatric:        Behavior: Behavior normal.        Thought Content: Thought content normal.        Judgment: Judgment normal.      ED Treatments / Results  Labs (all labs ordered are listed, but only abnormal results are displayed) Labs Reviewed  CBC WITH DIFFERENTIAL/PLATELET - Abnormal; Notable for the following components:      Result Value   WBC 18.5 (*)    Hemoglobin 11.7 (*)    RDW 16.5 (*)    Platelets 445 (*)    Neutro Abs 16.4 (*)    Monocytes Absolute 1.1 (*)    Abs Immature Granulocytes 0.10 (*)    All other components within normal limits  COMPREHENSIVE METABOLIC PANEL - Abnormal; Notable for the following components:   Glucose, Bld 154 (*)    BUN 26 (*)    Albumin 2.7 (*)    GFR calc non Af Amer 51 (*)    GFR calc Af Amer 59 (*)    All other components within normal limits  URINALYSIS, ROUTINE W REFLEX MICROSCOPIC -  Abnormal; Notable for the following components:   Color, Urine AMBER (*)    APPearance CLOUDY (*)    Ketones, ur 5 (*)    Protein, ur 100 (*)    Nitrite POSITIVE (*)    Leukocytes,Ua MODERATE (*)    WBC, UA >50 (*)    Bacteria, UA MANY (*)    Non Squamous Epithelial 0-5 (*)    All other components within normal limits  URINE CULTURE  CK    EKG EKG Interpretation  Date/Time:  Wednesday February 02 2019 22:14:02 EST Ventricular Rate:  78 PR Interval:    QRS Duration: 98 QT Interval:  372 QTC Calculation: 424 R Axis:   73 Text Interpretation: Sinus rhythm Left ventricular hypertrophy Nonspecific T abnormalities, diffuse leads No STEMI Confirmed by Nanda Quinton [id number]) on 02/02/2019 10:27:35 PM   Radiology Dg Lumbar Spine Complete  Result Date: 02/02/2019 CLINICAL DATA:  Recent fall with low back pain, initial encounter EXAM: LUMBAR SPINE - COMPLETE 4+ VIEW COMPARISON:  MRI from 10/11/2011 FINDINGS: Stable L2 compression deformity is noted. Anterolisthesis of L4 on L5 is noted stable  from the prior exam. Degenerative facet changes are seen. No acute fracture is noted. IMPRESSION: Chronic changes in the lumbar spine stable from previous exam. Electronically Signed   By: Inez Catalina M.D.   On: 02/02/2019 22:28   Dg Sacrum/coccyx  Result Date: 02/02/2019 CLINICAL DATA:  Recent fall with overlying sacral soft tissue wound, initial encounter EXAM: SACRUM AND COCCYX - 2+ VIEW COMPARISON:  None. FINDINGS: Pelvic ring is stable from previous hip exams. Prior fixation of the proximal femoral fractures are noted. Considerable artifact over the sacrum is seen. No definitive fracture is noted. If there is clinical concern CT would be helpful. IMPRESSION: No definitive fracture seen although considerable artifact is noted due to the cross-table technique. If there is strong suspicion for sacral fracture CT could be performed. Electronically Signed   By: Inez Catalina M.D.   On: 02/02/2019 22:30     Ct Head Wo Contrast  Result Date: 02/02/2019 CLINICAL DATA:  fall unknown head or neck trauma; Head trauma, minor, GCS>=13, low clinical risk, initial exam fall unknown head or neck trauma Found by neighbor lying on the floor. Fall this morning. EXAM: CT HEAD WITHOUT CONTRAST CT CERVICAL SPINE WITHOUT CONTRAST TECHNIQUE: Multidetector CT imaging of the head and cervical spine was performed following the standard protocol without intravenous contrast. Multiplanar CT image reconstructions of the cervical spine were also generated. COMPARISON:  Head and cervical spine CT 12/12/2018 FINDINGS: CT HEAD FINDINGS Brain: No intracranial hemorrhage, mass effect, or midline shift. Unchanged degree of atrophy and chronic small vessel ischemia. 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 No hydrocephalus. The basilar cisterns are patent. No evidence of territorial infarct or acute ischemia. No extra-axial or intracranial fluid collection. Vascular: Atherosclerosis of skullbase vasculature without hyperdense vessel or abnormal calcification. Skull: No fracture or focal lesion. Sinuses/Orbits: Paranasal sinuses and mastoid air cells are clear. The visualized orbits are unremarkable. Bilateral cataract resection. Other: None. CT CERVICAL SPINE FINDINGS Alignment: No traumatic subluxation. Slight anterolisthesis of C5 on C6 and C4 on C5 and is unchanged. Skull base and vertebrae: No acute fracture. Vertebral body heights are maintained. The dens and skull base are intact. Soft tissues and spinal canal: No prevertebral fluid or swelling. No visible canal hematoma. Disc levels: Diffuse degenerative disc disease, most prominent at C6-C7. Multilevel facet hypertrophy. Degenerative changes are stable from prior. Upper chest: Heterogeneous enlarged right lobe of the thyroid gland consistent with goiter. No dominant nodule. No further evaluation recommended given patient's advanced age. Carotid calcifications. Other:  None. IMPRESSION: 1. No acute intracranial abnormality. No skull fracture. Stable atrophy and chronic small vessel ischemia. 2. Multilevel degenerative change in the cervical spine without acute fracture or subluxation. Electronically Signed   By: Keith Rake M.D.   On: 02/02/2019 21:54   Ct Cervical Spine Wo Contrast  Result Date: 02/02/2019 CLINICAL DATA:  fall unknown head or neck trauma; Head trauma, minor, GCS>=13, low clinical risk, initial exam fall unknown head or neck trauma Found by neighbor lying on the floor. Fall this morning. EXAM: CT HEAD WITHOUT CONTRAST CT CERVICAL SPINE WITHOUT CONTRAST TECHNIQUE: Multidetector CT imaging of the head and cervical spine was performed following the standard protocol without intravenous contrast. Multiplanar CT image reconstructions of the cervical spine were also generated. COMPARISON:  Head and cervical spine CT 12/12/2018 FINDINGS: CT HEAD FINDINGS Brain: No intracranial hemorrhage, mass effect, or midline shift. Unchanged degree of atrophy and chronic small vessel ischemia. Small remote infarct in the right cerebellum again  seen. No hydrocephalus. The basilar cisterns are patent. No evidence of territorial infarct or acute ischemia. No extra-axial or intracranial fluid collection. Vascular: Atherosclerosis of skullbase vasculature without hyperdense vessel or abnormal calcification. Skull: No fracture or focal lesion. Sinuses/Orbits: Paranasal sinuses and mastoid air cells are clear. The visualized orbits are unremarkable. Bilateral cataract resection. Other: None. CT CERVICAL SPINE FINDINGS Alignment: No traumatic subluxation. Slight anterolisthesis of C5 on C6 and C4 on C5 and is unchanged. Skull base and vertebrae: No acute fracture. Vertebral body heights are maintained. The dens and skull base are intact. Soft tissues and spinal canal: No prevertebral fluid or swelling. No visible canal hematoma. Disc levels: Diffuse degenerative disc disease, most  prominent at C6-C7. Multilevel facet hypertrophy. Degenerative changes are stable from prior. Upper chest: Heterogeneous enlarged right lobe of the thyroid gland consistent with goiter. No dominant nodule. No further evaluation recommended given patient's advanced age. Carotid calcifications. Other: None. IMPRESSION: 1. No acute intracranial abnormality. No skull fracture. Stable atrophy and chronic small vessel ischemia. 2. Multilevel degenerative change in the cervical spine without acute fracture or subluxation. Electronically Signed   By: Keith Rake M.D.   On: 02/02/2019 21:54   Dg Knee Complete 4 Views Left  Result Date: 02/02/2019 CLINICAL DATA:  Recent fall with left knee pain, initial encounter EXAM: LEFT KNEE - COMPLETE 4+ VIEW COMPARISON:  None. FINDINGS: Changes consistent with the prior medullary rod placement in the femur are seen. Mild irregularity in the proximal fibular shaft is noted consistent with prior healed fracture. No acute fracture or dislocation is noted. Degenerative changes of the knee joint are seen as well. No joint effusion is noted. IMPRESSION: Chronic changes without acute abnormality. Electronically Signed   By: Inez Catalina M.D.   On: 02/02/2019 22:25   Dg Knee Complete 4 Views Right  Result Date: 02/02/2019 CLINICAL DATA:  Recent fall with right knee pain, initial encounter EXAM: RIGHT KNEE - COMPLETE 4+ VIEW COMPARISON:  None. FINDINGS: Changes consistent with prior fixation of the known femoral fracture are seen. Degenerative changes are noted particularly in the medial joint space. No acute fracture is seen. Small joint effusion is noted. IMPRESSION: Chronic changes without acute abnormality. Electronically Signed   By: Inez Catalina M.D.   On: 02/02/2019 22:27   Dg Hip Unilat With Pelvis 2-3 Views Left  Result Date: 02/02/2019 CLINICAL DATA:  Recent fall, initial encounter EXAM: DG HIP (WITH OR WITHOUT PELVIS) 3V LEFT COMPARISON:  10/28/2018 FINDINGS:  Changes consistent with prior proximal left femoral fracture are seen. Surgical fixation is noted. No new fracture is noted. Healing inferior pubic ramus fracture on the left is noted. No other focal abnormality is seen. IMPRESSION: Status post ORIF of proximal left femoral fracture. No acute abnormality is noted. Electronically Signed   By: Inez Catalina M.D.   On: 02/02/2019 22:23   Dg Hip Unilat With Pelvis 2-3 Views Right  Result Date: 02/02/2019 CLINICAL DATA:  Recent fall, initial encounter EXAM: DG HIP (WITH OR WITHOUT PELVIS) 3V RIGHT COMPARISON:  12/12/2018 FINDINGS: Previously seen right intratrochanteric fracture is again noted with prior surgical fixation. No dislocation is noted. No new definitive fracture is seen. Prior fixation of left femoral fracture is noted with some mild callus formation. Left inferior pubic ramus fracture with healing is noted as well. IMPRESSION: Changes of prior fractures bilaterally with surgical fixation. No new focal abnormality is seen. Electronically Signed   By: Inez Catalina M.D.   On: 02/02/2019 22:22  Dg Femur Min 2 Views Left  Result Date: 02/02/2019 CLINICAL DATA:  Recent fall with left leg pain, initial encounter EXAM: LEFT FEMUR 2 VIEWS COMPARISON:  10/27/2018 FINDINGS: Postsurgical changes are seen in the proximal left femur with mild healing. Old healed left inferior pubic ramus fracture is noted. No hardware abnormality is seen. No new femoral fracture is noted. IMPRESSION: Posttraumatic and postsurgical changes. No acute abnormality is noted. Electronically Signed   By: Inez Catalina M.D.   On: 02/02/2019 22:31    Procedures Procedures (including critical care time)  Medications Ordered in ED Medications  sodium chloride 0.9 % bolus 1,000 mL (has no administration in time range)  cefTRIAXone (ROCEPHIN) 1 g in sodium chloride 0.9 % 100 mL IVPB (has no administration in time range)     Initial Impression / Assessment and Plan / ED Course  I  have reviewed the triage vital signs and the nursing notes.  Pertinent labs & imaging results that were available during my care of the patient were reviewed by me and considered in my medical decision making (see chart for details).  Clinical Course as of Feb 01 2358  Wed Feb 02, 2019  2210 [name] granddaughter given update - provides additional history, states [name] patient's caregiver is with her every other day, left patinet at home last night at 7-8pm.  Son supposed to care of patient when caregiver leaves, states no one has heard of son and unable to be reached by phone.  Patient has probably not been checked in on since last night when caregiver left. Son currently has POA but 3 social workers involved because he is not taking care of her. [name] trying to get POA states a lot legal stuff going on. She would like to be primary contact.  [name] tore her ACL and cannot drive to help with patient care, she is trying to contact [name] in case patient gets discharged home tonight but unsure if patient will have caregiver tonight.    [CG]  2236 Spoke to [place] worker in [place] desk number [phone]   [CG]  2249 WBC(!): 18.5 [CG]  2316 Spoke to [place] regarding patient's social situation.  States the best course of action is to hold patient overnight and be discharged in the AM back home when caregiver will be there.     [CG]  2320 I have updated granddaughter [name].  She is on the phone with EMS supervisor, states EMS may have had to break down patient's front door and patient may not have a functioning door.  Given several social obstacles, we will keep patient overnight in ER.  [name] will notify oncoming SW team at 7 am tomorrow to assist as needed.    [CG]    Clinical Course User Index [CG] [name], PA-C   EMR reviewed to assist with MDM.   Several calls placed to patient's primary contacts without success.   Spoke to [place] granddaughter who  provides more history. [name] to be primary point of contact.    On exam pt is NAD.  Appears to be at baseline. Complaints of pain diffusely with movement but granddaughter confirms this is her baseline.  HD stable. She is oriented x 3, occasionally pleasantly confused.  Exam as above, will obtain respective imaging.  No signs of head trauma.   2323: ER work up reviewed and interpreted by me - non acute x-rays.  Patient is non ambulatory, bed/wheelchair bound with lifts for transfer.  Labs shows leukocytosis.  Afebrile, normal lungs exam, SpO2 and no signs of pulmonary infection clinically.  She has chronic foley, will send urine culture.  Electrolytes and CK normal.    2350: UA with nitrites, will give rocephin. No signs of SIRS/sepsis.  No need for medical admission at this time. Dry MM, will give IVF.  Re-evaluated patient, no decline.  Spoke to [employer] recommends overnight observation for patient safety with discharge in the AM.  Spoke to granddaughter [name] who states caregiver is scheduled to be at patient's home tomorrow AM and agreeable for obs in ER with dc in AM.  Pt has caregiver every other day but [name] attempting to arrange daily caregiver services.  Patient already has social work with APS involved.    Pt handed off to oncoming EDPA who will monitor patient overnight.  Morning SW team to f/u on case to assist with discharge. Anticipate dc back home with keflex for UTI assuming caregiver services confirmed. [name] granddaughter for updates [id number]. Discussed with EDP.  Final Clinical Impressions(s) / ED Diagnoses   Final diagnoses:  Fall, initial encounter  Acute cystitis without hematuria  Chronic indwelling Foley catheter    ED Discharge Orders    None       Arlean Hopping 02/02/19 2359    [name], MD 02/04/19 1359

## 2019-02-02 NOTE — ED Notes (Signed)
Pt remains in x-ray at this time.

## 2019-02-02 NOTE — ED Provider Notes (Signed)
85 year old female received at sign out from [name] pending morning social work consult. Per her HPI:  "[name] is a 85 y.o. female with pertinent pmh of multiple falls, recent bilateral hip fractures, chronic sacral ulcer, urinary retention s/p foley catheter, anemia, dementia presents to ER for evaluation of unwitnessed fall.  History obtained primary from patient who is fully oriented to name, place, year and events. States she was in her bedroom by her bed when she tried to grab her rolling walker.  [name] was too heavy and she fell. She doesn't know how she landed or if she hit her head.  She is complaining of pain in both of her hips, both of her knees and groin but states both of her hips "are already broken".  Left leg slightly shortened upon arrival, EMS has tied sheet across pelvis.  Patient states she lives with her dog and her son [name] lives across the driveway from her. She denies any Cp, Sob, abdominal pain loss of sensation or tingling to her extremities.  Has been feeling well and at her baseline otherwise.  She has a sacral ulcer and states she sometimes puts medicines on it.  States she sometimes walks with her walker but sometimes she walks without it at home.  Per triage note, patient had a fall earlier today but refused transfer to ER.  Neighbors saw patient laying on floor and called 911. APS involved. Unclear if son was present at the home before transport."   Physical Exam  BP (!) 183/84   Pulse 85   Temp 98.1 F (36.7 C) (Oral)   Resp (!) 25   Ht 5\' 3"  (1.6 m)   Wt 61.2 kg   SpO2 100%   BMI 23.90 kg/m   Physical Exam Vitals signs and nursing note reviewed.  Constitutional:      Appearance: She is well-developed.     Comments: Resting comfortably with equal, even respirations  HENT:     Head: Normocephalic and atraumatic.  Neck:     Musculoskeletal: Normal range of motion.     ED Course/Procedures   Clinical Course as of Feb 02 901  Wed Feb 02, 2019   2210 [name] granddaughter given update - provides additional history, states [name] patient's caregiver is with her every other day, left patinet at home last night at 7-8pm.  Son supposed to care of patient when caregiver leaves, states no one has heard of son and unable to be reached by phone.  Patient has probably not been checked in on since last night when caregiver left. Son currently has POA but 3 social workers involved because he is not taking care of her. [name] trying to get POA states a lot legal stuff going on. She would like to be primary contact.  [name] tore her ACL and cannot drive to help with patient care, she is trying to contact [name] in case patient gets discharged home tonight but unsure if patient will have caregiver tonight.    [CG]  2236 Spoke to [name] worker in [place] desk number [phone]   [CG]  2249 WBC(!): 18.5 [CG]  2316 Spoke to [name] regarding patient's social situation.  States the best course of action is to hold patient overnight and be discharged in the AM back home when caregiver will be there.     [CG]  2320 I have updated granddaughter [name].  She is on the phone with EMS supervisor, states EMS may have had  to break down patient's front door and patient may not have a functioning door.  Given several social obstacles, we will keep patient overnight in ER.  [name] will notify oncoming SW team at 7 am tomorrow to assist as needed.    [CG]    Clinical Course User Index [CG] [name], PA-C    Procedures  MDM    85 year old female received a signout from [name] pending a.m. social work consult. She has a pertinent pmh of multiple falls, recent bilateral hip fractures in 2020, chronic sacral ulcer, urinary retention s/p foley catheter, anemia, dementia and presented to the ER tonight after she had a fall. Please see PA [name]'s note for further work-up and medical decision making.  The patient's plan of care was  discussed with Dr. [name] and PA [name].  Patient has been medically cleared from the perspective of her fall tonight.  The patient was recently discharged and has been living at home with an in-home caregiver every other day.  The patient's son has been caring for the patient on days when the caregiver and is unable to come to the home.  There is concern about the patient's ability to ambulate at home.  She requires the assistance of a walker, but will also require assistance with transfer.  APS was involved after the patient was found down by a neighbor earlier tonight.  Social work was consulted tonight who recommended having the a.m. social worker follow-up with her in the morning.  Urine is concerning for infection and thought to be the source of leukocytosis of 18.  She was given a dose of Rocephin in the ER and urine culture has been sent.  Per PA Donney Rankins and Dr. [name], admission is not warranted UTI at this time as patient will likely be able to be discharged home tomorrow with a safe plan of care.   Overnight, the patient had no complaints. However, at shift change, nursing staff reports that the patient is now complaining of pain in her sacrum.  She does have a chronic sacral decubitus ulcer, but x-ray of the sacrum and coccyx stated that there was no definitive fracture seen although considerable artifact is noted and if there is a strong suspicion for sacral fracture CT should be performed. Oncoming PA, [name], will obtain CT of the pelvis and follow-up with social work.  Patient care transferred to [name] at the end of my shift. Patient presentation, ED course, and plan of care discussed with review of all pertinent labs and imaging. Please see his/her note for further details regarding further ED course and disposition.    [name], PA-C 02/03/19 0902    [name], MD 02/04/19 1359

## 2019-02-02 NOTE — ED Triage Notes (Signed)
Pt from home, neighbor saw pt lying on the floor and called for EMS. C/o L hip pain, has had surgeries to bilateral hips, shortening noted to L leg. Sheet tied by EMS around hips. Pt also had a fall this morning but refused transport at that time. Per EMS, pt's son is her caregiver but was not at home at time of the fall. APS involved

## 2019-02-03 ENCOUNTER — Encounter (HOSPITAL_COMMUNITY): Payer: Self-pay | Admitting: Internal Medicine

## 2019-02-03 ENCOUNTER — Emergency Department (HOSPITAL_COMMUNITY): Payer: Medicare Other

## 2019-02-03 ENCOUNTER — Other Ambulatory Visit: Payer: Self-pay

## 2019-02-03 DIAGNOSIS — Y846 Urinary catheterization as the cause of abnormal reaction of the patient, or of later complication, without mention of misadventure at the time of the procedure: Secondary | ICD-10-CM | POA: Diagnosis present

## 2019-02-03 DIAGNOSIS — S72141A Displaced intertrochanteric fracture of right femur, initial encounter for closed fracture: Secondary | ICD-10-CM | POA: Diagnosis not present

## 2019-02-03 DIAGNOSIS — E039 Hypothyroidism, unspecified: Secondary | ICD-10-CM

## 2019-02-03 DIAGNOSIS — D649 Anemia, unspecified: Secondary | ICD-10-CM | POA: Diagnosis present

## 2019-02-03 DIAGNOSIS — Z66 Do not resuscitate: Secondary | ICD-10-CM | POA: Diagnosis present

## 2019-02-03 DIAGNOSIS — L89154 Pressure ulcer of sacral region, stage 4: Secondary | ICD-10-CM

## 2019-02-03 DIAGNOSIS — M25561 Pain in right knee: Secondary | ICD-10-CM | POA: Diagnosis not present

## 2019-02-03 DIAGNOSIS — B9620 Unspecified Escherichia coli [E. coli] as the cause of diseases classified elsewhere: Secondary | ICD-10-CM | POA: Diagnosis present

## 2019-02-03 DIAGNOSIS — S8992XA Unspecified injury of left lower leg, initial encounter: Secondary | ICD-10-CM | POA: Diagnosis not present

## 2019-02-03 DIAGNOSIS — I10 Essential (primary) hypertension: Secondary | ICD-10-CM | POA: Diagnosis present

## 2019-02-03 DIAGNOSIS — S199XXA Unspecified injury of neck, initial encounter: Secondary | ICD-10-CM | POA: Diagnosis not present

## 2019-02-03 DIAGNOSIS — Z79899 Other long term (current) drug therapy: Secondary | ICD-10-CM | POA: Diagnosis not present

## 2019-02-03 DIAGNOSIS — Z8542 Personal history of malignant neoplasm of other parts of uterus: Secondary | ICD-10-CM | POA: Diagnosis not present

## 2019-02-03 DIAGNOSIS — R339 Retention of urine, unspecified: Secondary | ICD-10-CM | POA: Diagnosis not present

## 2019-02-03 DIAGNOSIS — H3530 Unspecified macular degeneration: Secondary | ICD-10-CM | POA: Diagnosis not present

## 2019-02-03 DIAGNOSIS — Z20828 Contact with and (suspected) exposure to other viral communicable diseases: Secondary | ICD-10-CM | POA: Diagnosis present

## 2019-02-03 DIAGNOSIS — S79922A Unspecified injury of left thigh, initial encounter: Secondary | ICD-10-CM | POA: Diagnosis not present

## 2019-02-03 DIAGNOSIS — M25551 Pain in right hip: Secondary | ICD-10-CM | POA: Diagnosis not present

## 2019-02-03 DIAGNOSIS — F0281 Dementia in other diseases classified elsewhere with behavioral disturbance: Secondary | ICD-10-CM | POA: Diagnosis not present

## 2019-02-03 DIAGNOSIS — T83511D Infection and inflammatory reaction due to indwelling urethral catheter, subsequent encounter: Secondary | ICD-10-CM | POA: Diagnosis not present

## 2019-02-03 DIAGNOSIS — M79605 Pain in left leg: Secondary | ICD-10-CM | POA: Diagnosis not present

## 2019-02-03 DIAGNOSIS — R52 Pain, unspecified: Secondary | ICD-10-CM | POA: Diagnosis not present

## 2019-02-03 DIAGNOSIS — M545 Low back pain: Secondary | ICD-10-CM | POA: Diagnosis not present

## 2019-02-03 DIAGNOSIS — R296 Repeated falls: Secondary | ICD-10-CM

## 2019-02-03 DIAGNOSIS — I2510 Atherosclerotic heart disease of native coronary artery without angina pectoris: Secondary | ICD-10-CM | POA: Diagnosis not present

## 2019-02-03 DIAGNOSIS — B964 Proteus (mirabilis) (morganii) as the cause of diseases classified elsewhere: Secondary | ICD-10-CM | POA: Diagnosis present

## 2019-02-03 DIAGNOSIS — Z90710 Acquired absence of both cervix and uterus: Secondary | ICD-10-CM | POA: Diagnosis not present

## 2019-02-03 DIAGNOSIS — F419 Anxiety disorder, unspecified: Secondary | ICD-10-CM | POA: Diagnosis not present

## 2019-02-03 DIAGNOSIS — T83511A Infection and inflammatory reaction due to indwelling urethral catheter, initial encounter: Secondary | ICD-10-CM

## 2019-02-03 DIAGNOSIS — M48061 Spinal stenosis, lumbar region without neurogenic claudication: Secondary | ICD-10-CM | POA: Diagnosis not present

## 2019-02-03 DIAGNOSIS — S3993XA Unspecified injury of pelvis, initial encounter: Secondary | ICD-10-CM | POA: Diagnosis not present

## 2019-02-03 DIAGNOSIS — S3992XA Unspecified injury of lower back, initial encounter: Secondary | ICD-10-CM | POA: Diagnosis not present

## 2019-02-03 DIAGNOSIS — M6281 Muscle weakness (generalized): Secondary | ICD-10-CM | POA: Diagnosis not present

## 2019-02-03 DIAGNOSIS — N390 Urinary tract infection, site not specified: Secondary | ICD-10-CM | POA: Diagnosis present

## 2019-02-03 DIAGNOSIS — L89620 Pressure ulcer of left heel, unstageable: Secondary | ICD-10-CM | POA: Diagnosis not present

## 2019-02-03 DIAGNOSIS — Z609 Problem related to social environment, unspecified: Secondary | ICD-10-CM | POA: Diagnosis not present

## 2019-02-03 DIAGNOSIS — Z9012 Acquired absence of left breast and nipple: Secondary | ICD-10-CM | POA: Diagnosis not present

## 2019-02-03 DIAGNOSIS — E1140 Type 2 diabetes mellitus with diabetic neuropathy, unspecified: Secondary | ICD-10-CM | POA: Diagnosis not present

## 2019-02-03 DIAGNOSIS — Z299 Encounter for prophylactic measures, unspecified: Secondary | ICD-10-CM | POA: Diagnosis not present

## 2019-02-03 DIAGNOSIS — Z79890 Hormone replacement therapy (postmenopausal): Secondary | ICD-10-CM | POA: Diagnosis not present

## 2019-02-03 DIAGNOSIS — G300 Alzheimer's disease with early onset: Secondary | ICD-10-CM

## 2019-02-03 DIAGNOSIS — S79911A Unspecified injury of right hip, initial encounter: Secondary | ICD-10-CM | POA: Diagnosis not present

## 2019-02-03 DIAGNOSIS — S72002A Fracture of unspecified part of neck of left femur, initial encounter for closed fracture: Secondary | ICD-10-CM | POA: Diagnosis not present

## 2019-02-03 DIAGNOSIS — D508 Other iron deficiency anemias: Secondary | ICD-10-CM | POA: Diagnosis not present

## 2019-02-03 DIAGNOSIS — E569 Vitamin deficiency, unspecified: Secondary | ICD-10-CM | POA: Diagnosis not present

## 2019-02-03 DIAGNOSIS — M25562 Pain in left knee: Secondary | ICD-10-CM | POA: Diagnosis not present

## 2019-02-03 DIAGNOSIS — F0391 Unspecified dementia with behavioral disturbance: Secondary | ICD-10-CM | POA: Diagnosis present

## 2019-02-03 DIAGNOSIS — N3000 Acute cystitis without hematuria: Secondary | ICD-10-CM | POA: Diagnosis not present

## 2019-02-03 DIAGNOSIS — T83098A Other mechanical complication of other indwelling urethral catheter, initial encounter: Secondary | ICD-10-CM | POA: Diagnosis not present

## 2019-02-03 DIAGNOSIS — Z7401 Bed confinement status: Secondary | ICD-10-CM | POA: Diagnosis not present

## 2019-02-03 DIAGNOSIS — M1990 Unspecified osteoarthritis, unspecified site: Secondary | ICD-10-CM | POA: Diagnosis not present

## 2019-02-03 DIAGNOSIS — E559 Vitamin D deficiency, unspecified: Secondary | ICD-10-CM | POA: Diagnosis not present

## 2019-02-03 DIAGNOSIS — T83518A Infection and inflammatory reaction due to other urinary catheter, initial encounter: Secondary | ICD-10-CM | POA: Diagnosis present

## 2019-02-03 DIAGNOSIS — S0990XA Unspecified injury of head, initial encounter: Secondary | ICD-10-CM | POA: Diagnosis not present

## 2019-02-03 DIAGNOSIS — E785 Hyperlipidemia, unspecified: Secondary | ICD-10-CM | POA: Diagnosis present

## 2019-02-03 DIAGNOSIS — F03918 Unspecified dementia, unspecified severity, with other behavioral disturbance: Secondary | ICD-10-CM | POA: Diagnosis present

## 2019-02-03 DIAGNOSIS — Z03818 Encounter for observation for suspected exposure to other biological agents ruled out: Secondary | ICD-10-CM | POA: Diagnosis not present

## 2019-02-03 DIAGNOSIS — Z7982 Long term (current) use of aspirin: Secondary | ICD-10-CM | POA: Diagnosis not present

## 2019-02-03 LAB — CBG MONITORING, ED: Glucose-Capillary: 123 mg/dL — ABNORMAL HIGH (ref 70–99)

## 2019-02-03 LAB — BASIC METABOLIC PANEL
Anion gap: 9 (ref 5–15)
BUN: 21 mg/dL (ref 8–23)
CO2: 25 mmol/L (ref 22–32)
Calcium: 8.9 mg/dL (ref 8.9–10.3)
Chloride: 106 mmol/L (ref 98–111)
Creatinine, Ser: 0.89 mg/dL (ref 0.44–1.00)
GFR calc Af Amer: >60 mL/min (ref >60)
GFR calc non Af Amer: 57 mL/min — ABNORMAL LOW (ref >60)
Glucose, Bld: 115 mg/dL — ABNORMAL HIGH (ref 70–99)
Potassium: 3.8 mmol/L (ref 3.5–5.1)
Sodium: 140 mmol/L (ref 135–145)

## 2019-02-03 LAB — CBC WITH DIFFERENTIAL/PLATELET
Abs Immature Granulocytes: 0.08 10*3/uL — ABNORMAL HIGH (ref 0.00–0.07)
Basophils Absolute: 0 10*3/uL (ref 0.0–0.1)
Basophils Relative: 0 %
Eosinophils Absolute: 0.4 10*3/uL (ref 0.0–0.5)
Eosinophils Relative: 2 %
HCT: 32.9 % — ABNORMAL LOW (ref 36.0–46.0)
Hemoglobin: 10.5 g/dL — ABNORMAL LOW (ref 12.0–15.0)
Immature Granulocytes: 1 %
Lymphocytes Relative: 7 %
Lymphs Abs: 1 10*3/uL (ref 0.7–4.0)
MCH: 29.7 pg (ref 26.0–34.0)
MCHC: 31.9 g/dL (ref 30.0–36.0)
MCV: 92.9 fL (ref 80.0–100.0)
Monocytes Absolute: 1.2 10*3/uL — ABNORMAL HIGH (ref 0.1–1.0)
Monocytes Relative: 8 %
Neutro Abs: 12.6 10*3/uL — ABNORMAL HIGH (ref 1.7–7.7)
Neutrophils Relative %: 82 %
Platelets: 395 10*3/uL (ref 150–400)
RBC: 3.54 MIL/uL — ABNORMAL LOW (ref 3.87–5.11)
RDW: 16.6 % — ABNORMAL HIGH (ref 11.5–15.5)
WBC: 15.2 10*3/uL — ABNORMAL HIGH (ref 4.0–10.5)
nRBC: 0 % (ref 0.0–0.2)

## 2019-02-03 LAB — SARS CORONAVIRUS 2 (TAT 6-24 HRS): SARS Coronavirus 2: NEGATIVE

## 2019-02-03 MED ORDER — ENOXAPARIN SODIUM 40 MG/0.4ML ~~LOC~~ SOLN
40.0000 mg | SUBCUTANEOUS | Status: DC
  Administered 2019-02-03 – 2019-02-09 (×6): 40 mg via SUBCUTANEOUS
  Filled 2019-02-03 (×6): qty 0.4

## 2019-02-03 MED ORDER — CHLORHEXIDINE GLUCONATE CLOTH 2 % EX PADS
6.0000 | MEDICATED_PAD | Freq: Every day | CUTANEOUS | Status: DC
  Administered 2019-02-03 – 2019-02-09 (×7): 6 via TOPICAL

## 2019-02-03 MED ORDER — SODIUM CHLORIDE 0.9 % IV SOLN
INTRAVENOUS | Status: AC
  Administered 2019-02-03: 14:00:00 via INTRAVENOUS

## 2019-02-03 MED ORDER — LEVOTHYROXINE SODIUM 50 MCG PO TABS
50.0000 ug | ORAL_TABLET | Freq: Every day | ORAL | Status: DC
  Administered 2019-02-04 – 2019-02-09 (×6): 50 ug via ORAL
  Filled 2019-02-03 (×6): qty 1

## 2019-02-03 MED ORDER — ACETAMINOPHEN 325 MG PO TABS
650.0000 mg | ORAL_TABLET | Freq: Four times a day (QID) | ORAL | Status: DC | PRN
  Administered 2019-02-04 – 2019-02-08 (×5): 650 mg via ORAL
  Filled 2019-02-03 (×5): qty 2

## 2019-02-03 MED ORDER — ONDANSETRON HCL 4 MG/2ML IJ SOLN: 4.0000 mg | Freq: Four times a day (QID) | INTRAMUSCULAR | Status: DC | PRN

## 2019-02-03 MED ORDER — GABAPENTIN 100 MG PO CAPS
100.0000 mg | ORAL_CAPSULE | Freq: Two times a day (BID) | ORAL | Status: DC
  Administered 2019-02-03 – 2019-02-09 (×12): 100 mg via ORAL
  Filled 2019-02-03 (×12): qty 1

## 2019-02-03 MED ORDER — ASPIRIN 81 MG PO CHEW
81.0000 mg | CHEWABLE_TABLET | Freq: Every day | ORAL | Status: DC
  Administered 2019-02-03 – 2019-02-09 (×7): 81 mg via ORAL
  Filled 2019-02-03 (×7): qty 1

## 2019-02-03 MED ORDER — SODIUM CHLORIDE 0.9 % IV SOLN
1.0000 g | Freq: Once | INTRAVENOUS | Status: AC
  Administered 2019-02-03: 12:00:00 1 g via INTRAVENOUS
  Filled 2019-02-03: qty 10

## 2019-02-03 MED ORDER — LOSARTAN POTASSIUM 50 MG PO TABS
100.0000 mg | ORAL_TABLET | Freq: Every day | ORAL | Status: DC
  Administered 2019-02-04 – 2019-02-09 (×6): 100 mg via ORAL
  Filled 2019-02-03 (×6): qty 2

## 2019-02-03 MED ORDER — ACETAMINOPHEN 650 MG RE SUPP: 650.0000 mg | Freq: Four times a day (QID) | RECTAL | Status: DC | PRN

## 2019-02-03 MED ORDER — DOCUSATE SODIUM 100 MG PO CAPS
100.0000 mg | ORAL_CAPSULE | Freq: Two times a day (BID) | ORAL | Status: DC
  Administered 2019-02-03 – 2019-02-09 (×13): 100 mg via ORAL
  Filled 2019-02-03 (×13): qty 1

## 2019-02-03 MED ORDER — ACETAMINOPHEN 325 MG PO TABS
650.0000 mg | ORAL_TABLET | ORAL | Status: DC | PRN
  Administered 2019-02-03: 650 mg via ORAL
  Filled 2019-02-03 (×2): qty 2

## 2019-02-03 MED ORDER — SODIUM CHLORIDE 0.9 % IV SOLN
1.0000 g | Freq: Every day | INTRAVENOUS | Status: DC
  Administered 2019-02-04 – 2019-02-05 (×2): 1 g via INTRAVENOUS
  Filled 2019-02-03 (×2): qty 10

## 2019-02-03 MED ORDER — ONDANSETRON HCL 4 MG PO TABS: 4.0000 mg | ORAL_TABLET | Freq: Four times a day (QID) | ORAL | Status: DC | PRN

## 2019-02-03 MED ORDER — TRAMADOL HCL 50 MG PO TABS
50.0000 mg | ORAL_TABLET | Freq: Four times a day (QID) | ORAL | Status: DC | PRN
  Administered 2019-02-05: 50 mg via ORAL
  Filled 2019-02-03: qty 1

## 2019-02-03 MED ORDER — POLYETHYLENE GLYCOL 3350 17 G PO PACK: 17.0000 g | PACK | Freq: Every day | ORAL | Status: DC | PRN

## 2019-02-03 NOTE — Progress Notes (Addendum)
1:20p CSW spoke with patient's granddaughter to update her that patient is being admitted. CSW encouraged granddaughter to call the hospital and speak to the RN to get an update on how she is doing and what room she will be moved to on the medical floor.   8:20a CSW spoke with patient's granddaughter Salena Saner [phone]) regarding patient's needs. Salena Saner reports patient cannot go home because the caregiver that would be with patient during the day is not coming back due to not being paid for services by patient's son. Salena Saner reports she lives in [place] and just recently tore her ACL so she is unable to drive to assist in taking care of patient. Salena Saner reports she is unable to assist in paying for the caretaker and she does not have access to patient's financial information. Salena Saner reports current APS involvement.   CSW called APS and spoke with Ebony Hail who reports she is not aware of the case, but will try to contact the person who is over patient's case Currie Paris) to get more information and provide her will the current information with patient being in the hospital. At this time there is no safe discharge plan for patient: patient has no caregiver, CSW unable to get in contact with patient's son (cannot leave a VM), APS unable to find emergency placement due to lack of resources in that area and it being the Thanksgiving holiday.   CSW will continue to follow up.  [place], LCSW Transitions of Care Department [facility] ED [phone]

## 2019-02-03 NOTE — ED Notes (Signed)
Pt moved from stretcher to hospital bed. Upon rolling pt to removed excess blankets, an old dressing, saturated with purulent drainage removed from blankets. After inspecting pts back and sacral areas, pt has a large, deep stage 4 sacral ulcer. MD had already been made aware of this by previous PA note. Will pass along to next RN in shift report. Pt states "its been there and it just won't get better" "it probably should be changed a few times a day, but it doesn't get changed but maybe a few times a week" "My son won't change it". New sacral dressing applied to area. Pillow placed under pts back to relieve pressure on sacral area.

## 2019-02-03 NOTE — ED Notes (Signed)
ED TO INPATIENT HANDOFF REPORT  ED Nurse Name and Phone #: Lynetta Tomczak   S Name/Age/Gender [name] 85 y.o. female Room/Bed: 048C/048C  Code Status   Code Status: DNR  Home/SNF/Other Home Patient oriented to: self Is this baseline? Yes   Triage Complete: Triage complete  Chief Complaint fall left hip pain  Triage Note Pt from home, neighbor saw pt lying on the floor and called for EMS. C/o L hip pain, has had surgeries to bilateral hips, shortening noted to L leg. Sheet tied by EMS around hips. Pt also had a fall this morning but refused transport at that time. Per EMS, pt's son is her caregiver but was not at home at time of the fall. APS involved    Allergies Allergies  Allergen Reactions  . Amlodipine Other (See Comments)    Leg edema   . Atenolol Other (See Comments)    REACTION: bradycardia  . Erythromycin Base Other (See Comments)    REACTION: stomach upset  . Lisinopril Other (See Comments)    REACTION: cough  (20 mg)    Level of Care/Admitting Diagnosis ED Disposition    ED Disposition Condition [facility]: [place] [[id number]]  Level of Care: Med-Surg [16]  Covid Evaluation: Asymptomatic Screening Protocol (No Symptoms)  Diagnosis: UTI (urinary tract infectionYN:[id number]  Admitting Physician: Karmen Bongo [[id number]]  Attending Physician: Karmen Bongo [[id number]]  Estimated length of stay: past midnight tomorrow  Certification:: I certify this patient will need inpatient services for at least 2 midnights  PT Class (Do Not Modify): Inpatient [101]  PT Acc Code (Do Not Modify): Private [1]       B Medical/Surgery History Past Medical History:  Diagnosis Date  . Anxiety 12/2002  . Arthritis    hands  . Cancer ([place])    breast left arm  . Cancer ([place])    uterine  . Diabetes mellitus 11/2004   Type 2  . Dyspnea    WITH EXERTION  . GERD (gastroesophageal reflux disease)   . Hyperlipidemia 11/2004  .  Hypertension 12/2002  . Osteopenia 02/2002   02/2002 Dexa osteopenia// Dexa 02/26/2004 (Dr. Ubaldo Glassing) stable/IMR femur/spine  . Spinal stenosis   . 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 Ronnald Collum endocrinologist  . Vertigo    Past Surgical History:  Procedure Laterality Date  . ABDOMINAL HYSTERECTOMY  1990   endometrial adenoca  . BREAST SURGERY  1980   left breast lumpectomy //abscess of breast evac.  Marland Kitchen CATARACT EXTRACTION W/PHACO Left 12/11/2016   Procedure: CATARACT EXTRACTION PHACO AND INTRAOCULAR LENS PLACEMENT (IOC);  Surgeon: Eulogio Bear, MD;  Location: [facility] ORS;  Service: Ophthalmology;  Laterality: Left;  [place] 1:18.4 AP 36.6% CDE 16.13  . CATARACT EXTRACTION W/PHACO Right 01/01/2017   Procedure: CATARACT EXTRACTION PHACO AND INTRAOCULAR LENS PLACEMENT (IOC);  Surgeon: Eulogio Bear, MD;  Location: [facility] ORS;  Service: Ophthalmology;  Laterality: Right;  [place]: 01:45.5 AP% 16.6 CDE 18.21 Fluid pack lot # [id number]  . CHOLECYSTECTOMY  07/10/1987  . EYE SURGERY     Tear Duct surgery, b/l cataract 2019   . FEMUR IM NAIL Right 12/13/2018   Procedure: INTRAMEDULLARY (IM) NAIL FEMORAL, RIGHT;  Surgeon: Corky Mull, MD;  Location: [facility] ORS;  Service: Orthopedics;  Laterality: Right;  . INTRAMEDULLARY (IM) NAIL INTERTROCHANTERIC Left 10/28/2018   Procedure: INTRAMEDULLARY (IM) NAIL INTERTROCHANTRIC LEFT LONG;  Surgeon: Hessie Knows, MD;  Location: [facility] ORS;  Service: Orthopedics;  Laterality: Left;  Marland Kitchen MASTECTOMY Left      A IV Location/Drains/Wounds Patient Lines/Drains/Airways Status   Active Line/Drains/Airways    Name:   Placement date:   Placement time:   Site:   Days:   Peripheral IV 02/03/19 Right Antecubital   02/03/19    1138    Antecubital   less than 1   External Urinary Catheter   12/14/18    0545    -   51   Incision (Closed) 10/28/18 Hip Left   10/28/18    1534     98   Incision (Closed) 12/13/18 Hip Right   12/13/18    1240     52    Wound / Incision (Open or Dehisced) 12/12/18 Other (Comment) Sacrum Medial Stage 4 Pressure Injury   12/12/18    1627    Sacrum   53   Wound / Incision (Open or Dehisced) 12/12/18 Other (Comment) Heel Left;Right Deep Tissue Injury   12/12/18    1629    Heel   53          Intake/Output Last 24 hours  Intake/Output Summary (Last 24 hours) at 02/03/2019 1410 Last data filed at 02/03/2019 1303 Gross per 24 hour  Intake 1000.19 ml  Output 600 ml  Net 400.19 ml    Labs/Imaging Results for orders placed or performed during the hospital encounter of 02/02/19 (from the past 48 hour(s))  CBC with Differential     Status: Abnormal   Collection Time: 02/02/19  8:49 PM  Result Value Ref Range   WBC 18.5 (H) 4.0 - 10.5 K/uL   RBC 3.98 3.87 - 5.11 MIL/uL   Hemoglobin 11.7 (L) 12.0 - 15.0 g/dL   HCT 37.2 36.0 - 46.0 %   MCV 93.5 80.0 - 100.0 fL   MCH 29.4 26.0 - 34.0 pg   MCHC 31.5 30.0 - 36.0 g/dL   RDW 16.5 (H) 11.5 - 15.5 %   Platelets 445 (H) 150 - 400 K/uL   nRBC 0.0 0.0 - 0.2 %   Neutrophils Relative % 89 %   Neutro Abs 16.4 (H) 1.7 - 7.7 K/uL   Lymphocytes Relative 4 %   Lymphs Abs 0.8 0.7 - 4.0 K/uL   Monocytes Relative 6 %   Monocytes Absolute 1.1 (H) 0.1 - 1.0 K/uL   Eosinophils Relative 0 %   Eosinophils Absolute 0.0 0.0 - 0.5 K/uL   Basophils Relative 0 %   Basophils Absolute 0.1 0.0 - 0.1 K/uL   Immature Granulocytes 1 %   Abs Immature Granulocytes 0.10 (H) 0.00 - 0.07 K/uL    Comment: Performed at [facility], [address], [place], [place] [address]  Comprehensive metabolic panel     Status: Abnormal   Collection Time: 02/02/19  8:49 PM  Result Value Ref Range   Sodium 141 135 - 145 mmol/L   Potassium 4.4 3.5 - 5.1 mmol/L    Comment: SLIGHT HEMOLYSIS   Chloride 107 98 - 111 mmol/L   CO2 24 22 - 32 mmol/L   Glucose, Bld 154 (H) 70 - 99 mg/dL   BUN 26 (H) 8 - 23 mg/dL   Creatinine, Ser 0.98 0.44 - 1.00 mg/dL   Calcium 9.3 8.9 - 10.3 mg/dL   Total  Protein 6.9 6.5 - 8.1 g/dL   Albumin 2.7 (L) 3.5 - 5.0 g/dL   AST 23 15 - 41 U/L   ALT 10 0 - 44 U/L  Alkaline Phosphatase 91 38 - 126 U/L   Total Bilirubin 0.9 0.3 - 1.2 mg/dL   GFR calc non Af Amer 51 (L) >60 mL/min   GFR calc Af Amer 59 (L) >60 mL/min   Anion gap 10 5 - 15    Comment: Performed at [facility] [address], [place], [place] [address]  CK     Status: None   Collection Time: 02/02/19  8:49 PM  Result Value Ref Range   Total CK 134 38 - 234 U/L    Comment: Performed at [facility], [place] [address], [place], [place] [address]  Urinalysis, Routine w reflex microscopic     Status: Abnormal   Collection Time: 02/02/19  8:49 PM  Result Value Ref Range   Color, Urine AMBER (A) YELLOW    Comment: BIOCHEMICALS MAY BE AFFECTED BY COLOR   APPearance CLOUDY (A) CLEAR   Specific Gravity, Urine 1.020 1.005 - 1.030   pH 5.0 5.0 - 8.0   Glucose, UA NEGATIVE NEGATIVE mg/dL   Hgb urine dipstick NEGATIVE NEGATIVE   Bilirubin Urine NEGATIVE NEGATIVE   Ketones, ur 5 (A) NEGATIVE mg/dL   Protein, ur 100 (A) NEGATIVE mg/dL   Nitrite POSITIVE (A) NEGATIVE   Leukocytes,Ua MODERATE (A) NEGATIVE   RBC / HPF 21-50 0 - 5 RBC/hpf   WBC, UA >50 (H) 0 - 5 WBC/hpf   Bacteria, UA MANY (A) NONE SEEN   Squamous Epithelial / LPF 0-5 0 - 5   WBC Clumps PRESENT    Mucus PRESENT    Budding Yeast PRESENT    Hyaline Casts, UA PRESENT    Non Squamous Epithelial 0-5 (A) NONE SEEN    Comment: Performed at [facility], [place] [address] [place], [place] [address]  CBG monitoring, ED     Status: Abnormal   Collection Time: 02/03/19  1:58 AM  Result Value Ref Range   Glucose-Capillary 123 (H) 70 - 99 mg/dL  CBC with Differential/Platelet     Status: Abnormal   Collection Time: 02/03/19 11:04 AM  Result Value Ref Range   WBC 15.2 (H) 4.0 - 10.5 K/uL   RBC 3.54 (L) 3.87 - 5.11 MIL/uL   Hemoglobin 10.5 (L) 12.0 - 15.0 g/dL   HCT 32.9 (L) 36.0 - 46.0 %   MCV 92.9 80.0 - 100.0 fL    MCH 29.7 26.0 - 34.0 pg   MCHC 31.9 30.0 - 36.0 g/dL   RDW 16.6 (H) 11.5 - 15.5 %   Platelets 395 150 - 400 K/uL   nRBC 0.0 0.0 - 0.2 %   Neutrophils Relative % 82 %   Neutro Abs 12.6 (H) 1.7 - 7.7 K/uL   Lymphocytes Relative 7 %   Lymphs Abs 1.0 0.7 - 4.0 K/uL   Monocytes Relative 8 %   Monocytes Absolute 1.2 (H) 0.1 - 1.0 K/uL   Eosinophils Relative 2 %   Eosinophils Absolute 0.4 0.0 - 0.5 K/uL   Basophils Relative 0 %   Basophils Absolute 0.0 0.0 - 0.1 K/uL   Immature Granulocytes 1 %   Abs Immature Granulocytes 0.08 (H) 0.00 - 0.07 K/uL    Comment: Performed at [facility], [address], [place], [place] [id number]  Basic metabolic panel     Status: Abnormal   Collection Time: 02/03/19 11:04 AM  Result Value Ref Range   Sodium 140 135 - 145 mmol/L   Potassium 3.8 3.5 - 5.1 mmol/L  Chloride 106 98 - 111 mmol/L   CO2 25 22 - 32 mmol/L   Glucose, Bld 115 (H) 70 - 99 mg/dL   BUN 21 8 - 23 mg/dL   Creatinine, Ser 0.89 0.44 - 1.00 mg/dL   Calcium 8.9 8.9 - 10.3 mg/dL   GFR calc non Af Amer 57 (L) >60 mL/min   GFR calc Af Amer >60 >60 mL/min   Anion gap 9 5 - 15    Comment: Performed at [facility] [address], [place], [place] [address]   Dg Lumbar Spine Complete  Result Date: 02/02/2019 CLINICAL DATA:  Recent fall with low back pain, initial encounter EXAM: LUMBAR SPINE - COMPLETE 4+ VIEW COMPARISON:  MRI from 10/11/2011 FINDINGS: Stable L2 compression deformity is noted. Anterolisthesis of L4 on L5 is noted stable from the prior exam. Degenerative facet changes are seen. No acute fracture is noted. IMPRESSION: Chronic changes in the lumbar spine stable from previous exam. Electronically Signed   By: Inez Catalina M.D.   On: 02/02/2019 22:28   Dg Sacrum/coccyx  Result Date: 02/02/2019 CLINICAL DATA:  Recent fall with overlying sacral soft tissue wound, initial encounter EXAM: SACRUM AND COCCYX - 2+ VIEW COMPARISON:  None. FINDINGS: Pelvic ring is stable  from previous hip exams. Prior fixation of the proximal femoral fractures are noted. Considerable artifact over the sacrum is seen. No definitive fracture is noted. If there is clinical concern CT would be helpful. IMPRESSION: No definitive fracture seen although considerable artifact is noted due to the cross-table technique. If there is strong suspicion for sacral fracture CT could be performed. Electronically Signed   By: Inez Catalina M.D.   On: 02/02/2019 22:30   Ct Head Wo Contrast  Result Date: 02/02/2019 CLINICAL DATA:  fall unknown head or neck trauma; Head trauma, minor, GCS>=13, low clinical risk, initial exam fall unknown head or neck trauma Found by neighbor lying on the floor. Fall this morning. EXAM: CT HEAD WITHOUT CONTRAST CT CERVICAL SPINE WITHOUT CONTRAST TECHNIQUE: Multidetector CT imaging of the head and cervical spine was performed following the standard protocol without intravenous contrast. Multiplanar CT image reconstructions of the cervical spine were also generated. COMPARISON:  Head and cervical spine CT 12/12/2018 FINDINGS: CT HEAD FINDINGS Brain: No intracranial hemorrhage, mass effect, or midline shift. Unchanged degree of atrophy and chronic small vessel ischemia. 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 No hydrocephalus. The basilar cisterns are patent. No evidence of territorial infarct or acute ischemia. No extra-axial or intracranial fluid collection. Vascular: Atherosclerosis of skullbase vasculature without hyperdense vessel or abnormal calcification. Skull: No fracture or focal lesion. Sinuses/Orbits: Paranasal sinuses and mastoid air cells are clear. The visualized orbits are unremarkable. Bilateral cataract resection. Other: None. CT CERVICAL SPINE FINDINGS Alignment: No traumatic subluxation. Slight anterolisthesis of C5 on C6 and C4 on C5 and is unchanged. Skull base and vertebrae: No acute fracture. Vertebral body heights are maintained. The dens and  skull base are intact. Soft tissues and spinal canal: No prevertebral fluid or swelling. No visible canal hematoma. Disc levels: Diffuse degenerative disc disease, most prominent at C6-C7. Multilevel facet hypertrophy. Degenerative changes are stable from prior. Upper chest: Heterogeneous enlarged right lobe of the thyroid gland consistent with goiter. No dominant nodule. No further evaluation recommended given patient's advanced age. Carotid calcifications. Other: None. IMPRESSION: 1. No acute intracranial abnormality. No skull fracture. Stable atrophy and chronic small vessel ischemia. 2. Multilevel degenerative change in the cervical spine without  acute fracture or subluxation. Electronically Signed   By: Keith Rake M.D.   On: 02/02/2019 21:54   Ct Cervical Spine Wo Contrast  Result Date: 02/02/2019 CLINICAL DATA:  fall unknown head or neck trauma; Head trauma, minor, GCS>=13, low clinical risk, initial exam fall unknown head or neck trauma Found by neighbor lying on the floor. Fall this morning. EXAM: CT HEAD WITHOUT CONTRAST CT CERVICAL SPINE WITHOUT CONTRAST TECHNIQUE: Multidetector CT imaging of the head and cervical spine was performed following the standard protocol without intravenous contrast. Multiplanar CT image reconstructions of the cervical spine were also generated. COMPARISON:  Head and cervical spine CT 12/12/2018 FINDINGS: CT HEAD FINDINGS Brain: No intracranial hemorrhage, mass effect, or midline shift. Unchanged degree of atrophy and chronic small vessel ischemia. 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 No hydrocephalus. The basilar cisterns are patent. No evidence of territorial infarct or acute ischemia. No extra-axial or intracranial fluid collection. Vascular: Atherosclerosis of skullbase vasculature without hyperdense vessel or abnormal calcification. Skull: No fracture or focal lesion. Sinuses/Orbits: Paranasal sinuses and mastoid air cells are clear. The  visualized orbits are unremarkable. Bilateral cataract resection. Other: None. CT CERVICAL SPINE FINDINGS Alignment: No traumatic subluxation. Slight anterolisthesis of C5 on C6 and C4 on C5 and is unchanged. Skull base and vertebrae: No acute fracture. Vertebral body heights are maintained. The dens and skull base are intact. Soft tissues and spinal canal: No prevertebral fluid or swelling. No visible canal hematoma. Disc levels: Diffuse degenerative disc disease, most prominent at C6-C7. Multilevel facet hypertrophy. Degenerative changes are stable from prior. Upper chest: Heterogeneous enlarged right lobe of the thyroid gland consistent with goiter. No dominant nodule. No further evaluation recommended given patient's advanced age. Carotid calcifications. Other: None. IMPRESSION: 1. No acute intracranial abnormality. No skull fracture. Stable atrophy and chronic small vessel ischemia. 2. Multilevel degenerative change in the cervical spine without acute fracture or subluxation. Electronically Signed   By: Keith Rake M.D.   On: 02/02/2019 21:54   Ct Pelvis Wo Contrast  Result Date: 02/03/2019 CLINICAL DATA:  Pelvic trauma.  Fall at home last night.  Dementia. EXAM: CT PELVIS WITHOUT CONTRAST TECHNIQUE: Multidetector CT imaging of the pelvis was performed following the standard protocol without intravenous contrast. COMPARISON:  Plain films 02/02/2019, 12/12/2018 FINDINGS: Urinary Tract: Foley catheter present within the bladder which is decompressed. Visualized ureters unremarkable. Bowel: Moderate fecal retention over the rectum with minimal associated wall thickening. Remainder of the colon is unremarkable. Appendix and visualized small bowel are normal. Vascular/Lymphatic: Calcified plaque over the distal abdominal aorta and common iliac arteries. No adenopathy. Reproductive:  Previous hysterectomy. Other:  No free fluid. Musculoskeletal: Bilateral intramedullary nails over the femurs with associated  compression screws bridging the femoral neck is into the femoral heads as hardware is intact. Evidence of patient's previous bilateral intertrochanteric fractures. No evidence of acute re-injury. Old left inferior pubic ramus fracture. No new acute fractures identified. Moderate spondylosis of the lumbosacral spine with mild disc disease at the L3-4 and L4-5 levels. Mild-to-moderate canal stenosis at the L3-4 and L4-5 levels. IMPRESSION: 1.  No acute fractures. 2. Evidence patient's previous bilateral intertrochanteric hip fractures post fixation with hardware intact. Old left inferior pubic ramus fracture. 3. Moderate spondylosis of the spine with disc disease as described and mild-to-moderate canal stenosis at the L3-4 and L4-5 levels. 4.  Aortic Atherosclerosis (ICD10-I70.0). 5.  Moderate fecal retention over the rectum. Electronically Signed   By: Marin Olp M.D.  On: 02/03/2019 10:06   Dg Knee Complete 4 Views Left  Result Date: 02/02/2019 CLINICAL DATA:  Recent fall with left knee pain, initial encounter EXAM: LEFT KNEE - COMPLETE 4+ VIEW COMPARISON:  None. FINDINGS: Changes consistent with the prior medullary rod placement in the femur are seen. Mild irregularity in the proximal fibular shaft is noted consistent with prior healed fracture. No acute fracture or dislocation is noted. Degenerative changes of the knee joint are seen as well. No joint effusion is noted. IMPRESSION: Chronic changes without acute abnormality. Electronically Signed   By: Inez Catalina M.D.   On: 02/02/2019 22:25   Dg Knee Complete 4 Views Right  Result Date: 02/02/2019 CLINICAL DATA:  Recent fall with right knee pain, initial encounter EXAM: RIGHT KNEE - COMPLETE 4+ VIEW COMPARISON:  None. FINDINGS: Changes consistent with prior fixation of the known femoral fracture are seen. Degenerative changes are noted particularly in the medial joint space. No acute fracture is seen. Small joint effusion is noted. IMPRESSION:  Chronic changes without acute abnormality. Electronically Signed   By: Inez Catalina M.D.   On: 02/02/2019 22:27   Dg Hip Unilat With Pelvis 2-3 Views Left  Result Date: 02/02/2019 CLINICAL DATA:  Recent fall, initial encounter EXAM: DG HIP (WITH OR WITHOUT PELVIS) 3V LEFT COMPARISON:  10/28/2018 FINDINGS: Changes consistent with prior proximal left femoral fracture are seen. Surgical fixation is noted. No new fracture is noted. Healing inferior pubic ramus fracture on the left is noted. No other focal abnormality is seen. IMPRESSION: Status post ORIF of proximal left femoral fracture. No acute abnormality is noted. Electronically Signed   By: Inez Catalina M.D.   On: 02/02/2019 22:23   Dg Hip Unilat With Pelvis 2-3 Views Right  Result Date: 02/02/2019 CLINICAL DATA:  Recent fall, initial encounter EXAM: DG HIP (WITH OR WITHOUT PELVIS) 3V RIGHT COMPARISON:  12/12/2018 FINDINGS: Previously seen right intratrochanteric fracture is again noted with prior surgical fixation. No dislocation is noted. No new definitive fracture is seen. Prior fixation of left femoral fracture is noted with some mild callus formation. Left inferior pubic ramus fracture with healing is noted as well. IMPRESSION: Changes of prior fractures bilaterally with surgical fixation. No new focal abnormality is seen. Electronically Signed   By: Inez Catalina M.D.   On: 02/02/2019 22:22   Dg Femur Min 2 Views Left  Result Date: 02/02/2019 CLINICAL DATA:  Recent fall with left leg pain, initial encounter EXAM: LEFT FEMUR 2 VIEWS COMPARISON:  10/27/2018 FINDINGS: Postsurgical changes are seen in the proximal left femur with mild healing. Old healed left inferior pubic ramus fracture is noted. No hardware abnormality is seen. No new femoral fracture is noted. IMPRESSION: Posttraumatic and postsurgical changes. No acute abnormality is noted. Electronically Signed   By: Inez Catalina M.D.   On: 02/02/2019 22:31    Pending Labs Unresulted Labs  (From admission, onward)    Start     Ordered   02/03/19 1033  SARS CORONAVIRUS 2 (TAT 6-24 HRS) Nasopharyngeal Nasopharyngeal Swab  (Asymptomatic/Tier 3)  Once,   STAT    Question Answer Comment  Is this test for diagnosis or screening Screening   Symptomatic for COVID-19 as defined by CDC No   Hospitalized for COVID-19 No   Admitted to ICU for COVID-19 No   Previously tested for COVID-19 Yes   Resident in a congregate (group) care setting Yes   Employed in healthcare setting No   Pregnant No  02/03/19 1032   02/02/19 2049  Urine culture  ONCE - STAT,   STAT     02/02/19 2048          Vitals/Pain Today's Vitals   02/03/19 0659 02/03/19 0717 02/03/19 0846 02/03/19 1203  BP:    138/63  Pulse:    72  Resp:    20  Temp:    98.3 F (36.8 C)  TempSrc:    Oral  SpO2:    98%  Weight:      Height:      PainSc: 8  Asleep 7      Isolation Precautions No active isolations  Medications Medications  cefTRIAXone (ROCEPHIN) 1 g in sodium chloride 0.9 % 100 mL IVPB (has no administration in time range)  aspirin chewable tablet 81 mg (81 mg Oral Given 02/03/19 1342)  traMADol (ULTRAM) tablet 50 mg (has no administration in time range)  losartan (COZAAR) tablet 100 mg (has no administration in time range)  levothyroxine (SYNTHROID) tablet 50 mcg (has no administration in time range)  polyethylene glycol (MIRALAX / GLYCOLAX) packet 17 g (has no administration in time range)  gabapentin (NEURONTIN) capsule 100 mg (has no administration in time range)  enoxaparin (LOVENOX) injection 40 mg (40 mg Subcutaneous Given 02/03/19 1341)  acetaminophen (TYLENOL) tablet 650 mg (has no administration in time range)    Or  acetaminophen (TYLENOL) suppository 650 mg (has no administration in time range)  docusate sodium (COLACE) capsule 100 mg (100 mg Oral Given 02/03/19 1342)  ondansetron (ZOFRAN) tablet 4 mg (has no administration in time range)    Or  ondansetron (ZOFRAN) injection 4 mg  (has no administration in time range)  0.9 %  sodium chloride infusion ( Intravenous New Bag/Given 02/03/19 1340)  sodium chloride 0.9 % bolus 1,000 mL (0 mLs Intravenous Stopped 02/03/19 0210)  cefTRIAXone (ROCEPHIN) 1 g in sodium chloride 0.9 % 100 mL IVPB (0 g Intravenous Stopped 02/03/19 0034)  cefTRIAXone (ROCEPHIN) 1 g in sodium chloride 0.9 % 100 mL IVPB (0 g Intravenous Stopped 02/03/19 1228)    Mobility non-ambulatory High fall risk   Focused Assessments   R Recommendations: See Admitting Provider Note  Report given to:   Additional Notes:

## 2019-02-03 NOTE — ED Notes (Signed)
Regular breakfast tray ordered.  

## 2019-02-03 NOTE — ED Notes (Signed)
Pt constantly concerned about who is picking her up and about her money that "[name]" has and how her son [name] does not like her. Gave pt water and animal crackers.

## 2019-02-03 NOTE — ED Notes (Signed)
Pt crying about family taking her money and wanting me to call someone to take her home. Pt also c/o pain to her sacrum area. Will give her tylenol for pain.

## 2019-02-03 NOTE — ED Notes (Signed)
Sprite and a banana given to pt. Call bell in pts reach. All questions answered. Pt confused at times.

## 2019-02-03 NOTE — ED Provider Notes (Signed)
Assumed care at change of shift. Awaiting SW placement/assistance in the morning. Review of imaging studies, pelvis XR suggests CT if concern for sacral fracture, CT ordered. Physical Exam  BP (!) 183/84   Pulse 85   Temp 98.1 F (36.7 C) (Oral)   Resp (!) 25   Ht 5\' 3"  (1.6 m)   Wt 61.2 kg   SpO2 100%   BMI 23.90 kg/m   Physical Exam  ED Course/Procedures   Clinical Course as of Feb 03 715  Wed Feb 02, 2019  2210 Salena Saner granddaughter given update - provides additional history, states [name] patient's caregiver is with her every other day, left patinet at home last night at 7-8pm.  Son supposed to care of patient when caregiver leaves, states no one has heard of son and unable to be reached by phone.  Patient has probably not been checked in on since last night when caregiver left. Son currently has POA but 3 social workers involved because he is not taking care of her. Salena Saner trying to get POA states a lot legal stuff going on. She would like to be primary contact.  Salena Saner tore her ACL and cannot drive to help with patient care, she is trying to contact [name] in case patient gets discharged home tonight but unsure if patient will have caregiver tonight.    [CG]  2236 Spoke to [place] worker in [place] desk number [phone]   [CG]  2249 WBC(!): 18.5 [CG]  2316 Spoke to [place] regarding patient's social situation.  States the best course of action is to hold patient overnight and be discharged in the AM back home when caregiver will be there.     [CG]  2320 I have updated granddaughter Salena Saner.  She is on the phone with EMS supervisor, states EMS may have had to break down patient's front door and patient may not have a functioning door.  Given several social obstacles, we will keep patient overnight in ER.  [name] will notify oncoming SW team at 7 am tomorrow to assist as needed.    [CG]    Clinical Course User Index [CG] [name], PA-C     Procedures  MDM  CT pelvis negative for fracture. Discussed with social work, unable to get patient home today, patient is bedbound, does not have home health and family is unable to care for her at this time. Will call medicine to consult for admission for her UTI. Case discussed with Dr. Lorin Mercy who will consult to see if patient meets medical admission criteria.       Tacy Learn, PA-C 02/04/19 0827    Pattricia Boss, MD 02/05/19 [phone]

## 2019-02-03 NOTE — ED Notes (Signed)
Diet was ordered for Lunch. 

## 2019-02-03 NOTE — H&P (Addendum)
History and Physical   NOTE: This was entered as a Consult Note in Error and is actually the H&P for this patient.   [name] [id number] DOB: Sep 06, 1926 DOA: 02/02/2019  PCP: McLean-Scocuzza, Nino Glow, MD Consultants:  Dellia Nims - wound; Mikle Bosworth - orthopedics Patient coming from:  Home; NOK: Son or granddaughter  Chief Complaint: Fall  HPI: [name] is a 85 y.o. female with medical history significant of hypothyroidism s/p thyroid ablation; HTN; HLD; DM; multiple falls; recent bilateral hip fractures; chronic sacral ulcer; urinary retention s/p foley catheter; anemia; dementia; and breast and uterine cancer presenting with a fall.  She has been followed at wound clinic for pressure ulcers on her heel as well as her sacrum (stage IV wound).  She was at [facility] but was discharged on 11/20 to home and apparently was too difficult for family to care for.  As a result, she fell, was brought to the ER, and they are unwilling to take her home.  Patient apparently fell in the AM on 11/25 and refused EMS transport but fell again in the evening and was seen in the floor by a neighbor who called 911.  Patient without apparent injury from the fall, complains of sacral pain (chronic ulceration).  The decision was made to keep her overnight (11/25) due to social issues.  Meanwhile, UA was concerning for UTI and she was given Rocephin.  On 11/25, there did not appear to be any indication for admission and so she was monitoring overnight in the ER.    CT pelvis was ordered to further evaluate and negative for fracture.  SW has been unable to arrange placement for the patient.  As per SW:  "At this time there is no safe discharge plan for patient: patient has no caregiver, CSW unable to get in contact with patient's son (cannot leave a VM), APS unable to find emergency placement due to lack of resources in that area and it being the Thanksgiving holiday."   At the time of my evaluation, the patient  was able to report her name, location, and that it is "20."  She talked about her dog in the closet and was concerned about him.  She reported sacral pain.   She was unaware of why she is here.    ED Course:  Fall 2 days ago.  Previously in facility but returned home with son.  He is no longer able to care for her.  SW is unable to resolve quickly.  Bedbound.  Would be nice to have her admitted for social reasons and "UTI."  Review of Systems: Unable to effectively perform  Ambulatory Status:  Ambulates with a walker?  Past Medical History:  Diagnosis Date   Anxiety 12/2002   Arthritis    hands   Cancer ([place])    breast left arm   Cancer ([place])    uterine   Diabetes mellitus 11/2004   Type 2   Dyspnea    WITH EXERTION   GERD (gastroesophageal reflux disease)    Hyperlipidemia 11/2004   Hypertension 12/2002   Osteopenia 02/2002   02/2002 Dexa osteopenia// Dexa 02/26/2004 (Dr. 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 Ronnald Collum endocrinologist   Vertigo     Past Surgical History:  Procedure Laterality Date   ABDOMINAL HYSTERECTOMY  1990   endometrial adenoca   BREAST [place]   left breast lumpectomy //  abscess of breast evac.   CATARACT EXTRACTION W/PHACO Left 12/11/2016   Procedure: CATARACT EXTRACTION PHACO AND INTRAOCULAR LENS PLACEMENT (IOC);  Surgeon: Eulogio Bear, MD;  Location: [facility] ORS;  Service: Ophthalmology;  Laterality: Left;  [place] 1:18.4 AP 36.6% CDE 16.13   CATARACT EXTRACTION W/PHACO Right 01/01/2017   Procedure: CATARACT EXTRACTION PHACO AND INTRAOCULAR LENS PLACEMENT (IOC);  Surgeon: Eulogio Bear, MD;  Location: [facility] ORS;  Service: Ophthalmology;  Laterality: Right;  [place]: 01:45.5 AP% 16.6 CDE 18.21 Fluid pack lot # [id number]   CHOLECYSTECTOMY  07/10/1987   EYE SURGERY     Tear Duct surgery, b/l cataract June 12, 2017    FEMUR IM NAIL Right 12/13/2018    Procedure: INTRAMEDULLARY (IM) NAIL FEMORAL, RIGHT;  Surgeon: Corky Mull, MD;  Location: [facility] ORS;  Service: Orthopedics;  Laterality: Right;   INTRAMEDULLARY (IM) NAIL INTERTROCHANTERIC Left 10/28/2018   Procedure: INTRAMEDULLARY (IM) NAIL INTERTROCHANTRIC LEFT LONG;  Surgeon: Hessie Knows, MD;  Location: [facility] ORS;  Service: Orthopedics;  Laterality: Left;   MASTECTOMY Left     Social History   Socioeconomic History   Marital status: Married    Spouse name: Not on file   Number of children: Not on file   Years of education: Not on file   Highest education level: Not on file  Occupational History   Not on file  Social Needs   Financial resource strain: Not on file   Food insecurity    Worry: Not on file    Inability: Not on file   Transportation needs    Medical: Not on file    Non-medical: Not on file  Tobacco Use   Smoking status: Never Smoker   Smokeless tobacco: Never Used  Substance and Sexual Activity   Alcohol use: No    Alcohol/week: 0.0 standard drinks   Drug use: No   Sexual activity: Not on file  Lifestyle   Physical activity    Days per week: Not on file    Minutes per session: Not on file   Stress: Not on file  Relationships   Social connections    Talks on phone: Not on file    Gets together: Not on file    Attends religious service: Not on file    Active member of club or organization: Not on file    Attends meetings of clubs or organizations: Not on file    Relationship status: Not on file   Intimate partner violence    Fear of current or ex partner: Not on file    Emotionally abused: Not on file    Physically abused: Not on file    Forced sexual activity: Not on file  Other Topics Concern   Not on file  Social History Narrative   Takes care of husband full time who has alzheimers -- very difficult with little help   Relies on family I.e granddaughter and grandaughters other grandmother to bring to appts       Has dog  [place]    1 son living 1 son died June 12, 2009 suicide     Allergies  Allergen Reactions   Amlodipine Other (See Comments)    Leg edema    Atenolol Other (See Comments)    REACTION: bradycardia   Erythromycin Base Other (See Comments)    REACTION: stomach upset   Lisinopril Other (See Comments)    REACTION: cough  (20 mg)    Family History  Problem Relation Age of Onset   Alzheimer's disease  Mother    Heart disease Mother 71       angina   Hypertension Mother    Crohn's disease Mother        ? crohns disease   Cancer Father 29       colon cancer   Cancer Sister 32       breast cancer    COPD Sister        emphysema (smoker)   Heart disease Sister        angina   Alcohol abuse Brother    Diabetes Paternal Aunt    Hypertension Sister    Stroke Neg Hx     Prior to Admission medications   Medication Sig Start Date End Date Taking? Authorizing Provider  aspirin 81 MG tablet Take 81 mg by mouth daily. Daily with food     [provider]  calcium carbonate (OS-CAL - DOSED IN MG OF ELEMENTAL CALCIUM) 1250 (500 Ca) MG tablet Take 1 tablet (500 mg of elemental calcium total) by mouth daily with breakfast. 12/16/18   Mayo, Pete Pelt, MD  Cholecalciferol 1.25 MG (50000 UT) capsule Take 1 capsule (50,000 Units total) by mouth once a week. 03/12/18   McLean-Scocuzza, Nino Glow, MD  docusate sodium (COLACE) 100 MG capsule Take 1 capsule (100 mg total) by mouth 2 (two) times daily as needed for mild constipation. 12/15/18   Mayo, Pete Pelt, MD  enoxaparin (LOVENOX) 40 MG/0.4ML injection Inject 0.4 mLs (40 mg total) into the skin daily for 14 days. 12/16/18 12/30/18  Mayo, Pete Pelt, MD  ferrous sulfate 325 (65 FE) MG tablet Take 1 tablet (325 mg total) by mouth daily. 12/15/18 01/14/19  Mayo, Pete Pelt, MD  gabapentin (NEURONTIN) 100 MG capsule TAKE ONE (1) CAPSULE BY MOUTH 2 TIMES DAILY 03/12/18   McLean-Scocuzza, Nino Glow, MD  levothyroxine (SYNTHROID, LEVOTHROID) 50 MCG  tablet Take 1 tablet (50 mcg total) by mouth daily. 03/12/18   McLean-Scocuzza, Nino Glow, MD  losartan (COZAAR) 100 MG tablet Take 1 tablet (100 mg total) by mouth daily. 11/01/18   Gladstone Lighter, MD  mometasone (ELOCON) 0.1 % cream Apply 1 application topically daily. To affected areas behind ears 03/26/17   Tower, Roque Lias A, MD  polyethylene glycol powder (GLYCOLAX/MIRALAX) powder Take 17 g by mouth daily as needed. As directed for constipation 03/26/17   Tower, Wynelle Fanny, MD  traMADol (ULTRAM) 50 MG tablet Take 1 tablet (50 mg total) by mouth every 6 (six) hours as needed for moderate pain. 12/15/18   Mayo, Pete Pelt, MD  vitamin C (ASCORBIC ACID) 500 MG tablet Take 500 mg by mouth daily.    [provider]    Physical Exam: Vitals:   02/03/19 0200 02/03/19 1203 02/03/19 1412 02/03/19 1530  BP: (!) 183/84 138/63 132/60 (!) 137/107  Pulse:  72 70 68  Resp: (!) 25 20 20 16   Temp:  98.3 F (36.8 C)  98.7 F (37.1 C)  TempSrc:  Oral  Oral  SpO2:  98% 100% 93%  Weight:      Height:          General:  Appears calm and comfortable and is NAD  Eyes:  PERRL, EOMI, normal lids, iris  ENT:  grossly normal hearing, lips & tongue, mmm  Neck:  no LAD, masses or thyromegaly  Cardiovascular:  RRR, no m/r/g. No LE edema.   Respiratory:   CTA bilaterally with no wheezes/rales/rhonchi.  Normal respiratory effort.  Abdomen:  soft, NT, ND,  NABS  Skin:  no rash or induration seen on limited exam; reported stage IV sacral pressure ulcer, chronic  Musculoskeletal:  grossly normal tone BUE/BLE, good ROM, no bony abnormality  Psychiatric:  grossly normal mood and affect, speech fluent and appropriate, AOx3 but clearly unable to successfully provide history    Radiological Exams on Admission: Dg Lumbar Spine Complete  Result Date: 02/02/2019 CLINICAL DATA:  Recent fall with low back pain, initial encounter EXAM: LUMBAR SPINE - COMPLETE 4+ VIEW COMPARISON:  MRI from 10/11/2011 FINDINGS:  Stable L2 compression deformity is noted. Anterolisthesis of L4 on L5 is noted stable from the prior exam. Degenerative facet changes are seen. No acute fracture is noted. IMPRESSION: Chronic changes in the lumbar spine stable from previous exam. Electronically Signed   By: Inez Catalina M.D.   On: 02/02/2019 22:28   Dg Sacrum/coccyx  Result Date: 02/02/2019 CLINICAL DATA:  Recent fall with overlying sacral soft tissue wound, initial encounter EXAM: SACRUM AND COCCYX - 2+ VIEW COMPARISON:  None. FINDINGS: Pelvic ring is stable from previous hip exams. Prior fixation of the proximal femoral fractures are noted. Considerable artifact over the sacrum is seen. No definitive fracture is noted. If there is clinical concern CT would be helpful. IMPRESSION: No definitive fracture seen although considerable artifact is noted due to the cross-table technique. If there is strong suspicion for sacral fracture CT could be performed. Electronically Signed   By: Inez Catalina M.D.   On: 02/02/2019 22:30   Ct Head Wo Contrast  Result Date: 02/02/2019 CLINICAL DATA:  fall unknown head or neck trauma; Head trauma, minor, GCS>=13, low clinical risk, initial exam fall unknown head or neck trauma Found by neighbor lying on the floor. Fall this morning. EXAM: CT HEAD WITHOUT CONTRAST CT CERVICAL SPINE WITHOUT CONTRAST TECHNIQUE: Multidetector CT imaging of the head and cervical spine was performed following the standard protocol without intravenous contrast. Multiplanar CT image reconstructions of the cervical spine were also generated. COMPARISON:  Head and cervical spine CT 12/12/2018 FINDINGS: CT HEAD FINDINGS Brain: No intracranial hemorrhage, mass effect, or midline shift. Unchanged degree of atrophy and chronic small vessel ischemia. 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 No hydrocephalus. The basilar cisterns are patent. No evidence of territorial infarct or acute ischemia. No extra-axial or intracranial  fluid collection. Vascular: Atherosclerosis of skullbase vasculature without hyperdense vessel or abnormal calcification. Skull: No fracture or focal lesion. Sinuses/Orbits: Paranasal sinuses and mastoid air cells are clear. The visualized orbits are unremarkable. Bilateral cataract resection. Other: None. CT CERVICAL SPINE FINDINGS Alignment: No traumatic subluxation. Slight anterolisthesis of C5 on C6 and C4 on C5 and is unchanged. Skull base and vertebrae: No acute fracture. Vertebral body heights are maintained. The dens and skull base are intact. Soft tissues and spinal canal: No prevertebral fluid or swelling. No visible canal hematoma. Disc levels: Diffuse degenerative disc disease, most prominent at C6-C7. Multilevel facet hypertrophy. Degenerative changes are stable from prior. Upper chest: Heterogeneous enlarged right lobe of the thyroid gland consistent with goiter. No dominant nodule. No further evaluation recommended given patient's advanced age. Carotid calcifications. Other: None. IMPRESSION: 1. No acute intracranial abnormality. No skull fracture. Stable atrophy and chronic small vessel ischemia. 2. Multilevel degenerative change in the cervical spine without acute fracture or subluxation. Electronically Signed   By: Keith Rake M.D.   On: 02/02/2019 21:54   Ct Cervical Spine Wo Contrast  Result Date: 02/02/2019 CLINICAL DATA:  fall unknown head or neck trauma;  Head trauma, minor, GCS>=13, low clinical risk, initial exam fall unknown head or neck trauma Found by neighbor lying on the floor. Fall this morning. EXAM: CT HEAD WITHOUT CONTRAST CT CERVICAL SPINE WITHOUT CONTRAST TECHNIQUE: Multidetector CT imaging of the head and cervical spine was performed following the standard protocol without intravenous contrast. Multiplanar CT image reconstructions of the cervical spine were also generated. COMPARISON:  Head and cervical spine CT 12/12/2018 FINDINGS: CT HEAD FINDINGS Brain: No  intracranial hemorrhage, mass effect, or midline shift. Unchanged degree of atrophy and chronic small vessel ischemia. 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 No hydrocephalus. The basilar cisterns are patent. No evidence of territorial infarct or acute ischemia. No extra-axial or intracranial fluid collection. Vascular: Atherosclerosis of skullbase vasculature without hyperdense vessel or abnormal calcification. Skull: No fracture or focal lesion. Sinuses/Orbits: Paranasal sinuses and mastoid air cells are clear. The visualized orbits are unremarkable. Bilateral cataract resection. Other: None. CT CERVICAL SPINE FINDINGS Alignment: No traumatic subluxation. Slight anterolisthesis of C5 on C6 and C4 on C5 and is unchanged. Skull base and vertebrae: No acute fracture. Vertebral body heights are maintained. The dens and skull base are intact. Soft tissues and spinal canal: No prevertebral fluid or swelling. No visible canal hematoma. Disc levels: Diffuse degenerative disc disease, most prominent at C6-C7. Multilevel facet hypertrophy. Degenerative changes are stable from prior. Upper chest: Heterogeneous enlarged right lobe of the thyroid gland consistent with goiter. No dominant nodule. No further evaluation recommended given patient's advanced age. Carotid calcifications. Other: None. IMPRESSION: 1. No acute intracranial abnormality. No skull fracture. Stable atrophy and chronic small vessel ischemia. 2. Multilevel degenerative change in the cervical spine without acute fracture or subluxation. Electronically Signed   By: Keith Rake M.D.   On: 02/02/2019 21:54   Ct Pelvis Wo Contrast  Result Date: 02/03/2019 CLINICAL DATA:  Pelvic trauma.  Fall at home last night.  Dementia. EXAM: CT PELVIS WITHOUT CONTRAST TECHNIQUE: Multidetector CT imaging of the pelvis was performed following the standard protocol without intravenous contrast. COMPARISON:  Plain films 02/02/2019, 12/12/2018 FINDINGS:  Urinary Tract: Foley catheter present within the bladder which is decompressed. Visualized ureters unremarkable. Bowel: Moderate fecal retention over the rectum with minimal associated wall thickening. Remainder of the colon is unremarkable. Appendix and visualized small bowel are normal. Vascular/Lymphatic: Calcified plaque over the distal abdominal aorta and common iliac arteries. No adenopathy. Reproductive:  Previous hysterectomy. Other:  No free fluid. Musculoskeletal: Bilateral intramedullary nails over the femurs with associated compression screws bridging the femoral neck is into the femoral heads as hardware is intact. Evidence of patient's previous bilateral intertrochanteric fractures. No evidence of acute re-injury. Old left inferior pubic ramus fracture. No new acute fractures identified. Moderate spondylosis of the lumbosacral spine with mild disc disease at the L3-4 and L4-5 levels. Mild-to-moderate canal stenosis at the L3-4 and L4-5 levels. IMPRESSION: 1.  No acute fractures. 2. Evidence patient's previous bilateral intertrochanteric hip fractures post fixation with hardware intact. Old left inferior pubic ramus fracture. 3. Moderate spondylosis of the spine with disc disease as described and mild-to-moderate canal stenosis at the L3-4 and L4-5 levels. 4.  Aortic Atherosclerosis (ICD10-I70.0). 5.  Moderate fecal retention over the rectum. Electronically Signed   By: Marin Olp M.D.   On: 02/03/2019 10:06   Dg Knee Complete 4 Views Left  Result Date: 02/02/2019 CLINICAL DATA:  Recent fall with left knee pain, initial encounter EXAM: LEFT KNEE - COMPLETE 4+ VIEW COMPARISON:  None. FINDINGS: Changes  consistent with the prior medullary rod placement in the femur are seen. Mild irregularity in the proximal fibular shaft is noted consistent with prior healed fracture. No acute fracture or dislocation is noted. Degenerative changes of the knee joint are seen as well. No joint effusion is noted.  IMPRESSION: Chronic changes without acute abnormality. Electronically Signed   By: Inez Catalina M.D.   On: 02/02/2019 22:25   Dg Knee Complete 4 Views Right  Result Date: 02/02/2019 CLINICAL DATA:  Recent fall with right knee pain, initial encounter EXAM: RIGHT KNEE - COMPLETE 4+ VIEW COMPARISON:  None. FINDINGS: Changes consistent with prior fixation of the known femoral fracture are seen. Degenerative changes are noted particularly in the medial joint space. No acute fracture is seen. Small joint effusion is noted. IMPRESSION: Chronic changes without acute abnormality. Electronically Signed   By: Inez Catalina M.D.   On: 02/02/2019 22:27   Dg Hip Unilat With Pelvis 2-3 Views Left  Result Date: 02/02/2019 CLINICAL DATA:  Recent fall, initial encounter EXAM: DG HIP (WITH OR WITHOUT PELVIS) 3V LEFT COMPARISON:  10/28/2018 FINDINGS: Changes consistent with prior proximal left femoral fracture are seen. Surgical fixation is noted. No new fracture is noted. Healing inferior pubic ramus fracture on the left is noted. No other focal abnormality is seen. IMPRESSION: Status post ORIF of proximal left femoral fracture. No acute abnormality is noted. Electronically Signed   By: Inez Catalina M.D.   On: 02/02/2019 22:23   Dg Hip Unilat With Pelvis 2-3 Views Right  Result Date: 02/02/2019 CLINICAL DATA:  Recent fall, initial encounter EXAM: DG HIP (WITH OR WITHOUT PELVIS) 3V RIGHT COMPARISON:  12/12/2018 FINDINGS: Previously seen right intratrochanteric fracture is again noted with prior surgical fixation. No dislocation is noted. No new definitive fracture is seen. Prior fixation of left femoral fracture is noted with some mild callus formation. Left inferior pubic ramus fracture with healing is noted as well. IMPRESSION: Changes of prior fractures bilaterally with surgical fixation. No new focal abnormality is seen. Electronically Signed   By: Inez Catalina M.D.   On: 02/02/2019 22:22   Dg Femur Min 2 Views  Left  Result Date: 02/02/2019 CLINICAL DATA:  Recent fall with left leg pain, initial encounter EXAM: LEFT FEMUR 2 VIEWS COMPARISON:  10/27/2018 FINDINGS: Postsurgical changes are seen in the proximal left femur with mild healing. Old healed left inferior pubic ramus fracture is noted. No hardware abnormality is seen. No new femoral fracture is noted. IMPRESSION: Posttraumatic and postsurgical changes. No acute abnormality is noted. Electronically Signed   By: Inez Catalina M.D.   On: 02/02/2019 22:31    EKG: Independently reviewed.  NSR with rate 78; LVH; nonspecific ST changes with no evidence of acute ischemia   Labs on Admission: I have personally reviewed the available labs and imaging studies at the time of the admission.  Pertinent labs from 11/25:   Glucose 154 Albumin 2.7 CK 134 WBC 18.5 Hgb 11.7 Platelets 445 UA: 5 ketones, moderate LE, + nitrites, 100 protein, many bacteria, >50 WBC Urine culture pending   Assessment/Plan Principal Problem:   UTI (urinary tract infection) due to urinary indwelling Foley catheter [facility]) Active Problems:   Essential hypertension   Hypothyroidism   Falls frequently   Hospital admission due to social situation   Stage IV pressure ulcer of sacral region ([place])   Dementia with behavioral disturbance ([place])   UTI -Patient with indwelling foley -Poor historian, no obvious symptoms but UA is concerning for  infection -Urine culture is pending -Patient has received 2 days of Rocephin, likely needs at least 3-5 days of treatment  Social admission -Patient previously admitted to [facility] and appears to have been home <1 week -SW is actively working on placement -Appears likely to need to return to a facility at this time due to insufficient social support at home -Of note, per Dr. Janalyn Rouse last note, the patient was apparently refusing to stay in the facility and home hospice was possibly engaged  Dementia -Mild dementia noted on exam  which appears to be c/w prior admission -Will follow  Stage IV sacral pressure ulcer, chronic -Last seen by wound care on 11/18 -Care has been with silver collagen packed with normal saline wet-to-dry  -Wound care consult requested  Frequent falls -Prior R hip fracture in October resulting in ORIF and return to SNF placement -She was home for <1 week and had at least 2 falls -She appears to need ongoing SNF rehab -PT/OT consults requested  HTN -Continue Losartan  Hypothyroidism -Normal TSH on 7/30 -Continue Synthroid at current dose for now     Note: This patient has been tested and is negative for the novel coronavirus COVID-19.  DVT prophylaxis:  Lovenox  Code Status:  DNR - based on Palliative Care note from prior admission Family Communication: None present Disposition Plan:  Likely to need SNF once clinically improved Consults called: PT/OT/SW Admission status: Admit - It is my clinical opinion that admission to INPATIENT is reasonable and necessary because of the expectation that this patient will require hospital care that crosses at least 2 midnights to treat this condition based on the medical complexity of the problems presented.  Given the aforementioned information, the predictability of an adverse outcome is felt to be significant.    Karmen Bongo MD [facility]   How to contact the [facility] Attending or Consulting provider [place] or covering provider during after hours [place], for this patient?  1. Check the care team in [facility] and look for a) attending/consulting TR[facility] provider listed and b) the [facility] team listed 2. Log into [email or web address] and use [place]'s universal password to access. If you do not have the password, please contact the hospital operator. 3. Locate the [facility] provider you are looking for under [facility] and page to a number that you can be directly reached. 4. If you still have difficulty reaching the provider, please page the [facility]  (Director on Call) for the Hospitalists listed on amion for assistance.   02/03/2019, 4:41 PM

## 2019-02-03 NOTE — Progress Notes (Signed)
NEW ADMISSION NOTE New Admission Note:   Arrival Method: Bed Mental Orientation: A&O X1 Telemetry: Not Ordered Assessment: Completed Skin: See Flowsheets IV: WDL Safety Measures: Safety Fall Prevention Plan has been given, discussed and signed Admission:  Unable to complete D/T patients confusion.  5 [place] Orientation: Patient has been orientated to the room, unit and staff.   Orders have been reviewed and implemented. Will continue to monitor the patient. Call light has been placed within reach and bed alarm has been activated.   Aneta Mins BSN, RN3

## 2019-02-04 ENCOUNTER — Encounter (HOSPITAL_COMMUNITY): Payer: Self-pay | Admitting: Internal Medicine

## 2019-02-04 DIAGNOSIS — T83511D Infection and inflammatory reaction due to indwelling urethral catheter, subsequent encounter: Secondary | ICD-10-CM

## 2019-02-04 NOTE — Progress Notes (Addendum)
Progress Note    VONICE [name]  [id number] DOB: 1926-05-08  DOA: 02/02/2019 PCP: McLean-Scocuzza, Nino Glow, MD      Brief Narrative:    Medical records reviewed and are as summarized below:  [name] is an 85 y.o. female with medical history significant for hypothyroidism, hypertension, hyperlipidemia, diabetes mellitus, multiple falls, recent bilateral hip fractures requiring surgery, stage IV sacral decubitus ulcer, urinary retention requiring Foley catheter, anemia, dementia, breast and uterine cancer.  She was recently discharged from [facility] on 01/28/2019 but apparently family has had a difficult time taking care of the patient. She was brought to the hospital after she was found on the floor at home.     Assessment/Plan:   Principal Problem:   UTI (urinary tract infection) due to urinary indwelling Foley catheter [facility]) Active Problems:   Essential hypertension   Hypothyroidism   Falls frequently   Hospital admission due to social situation   Stage IV pressure ulcer of sacral region [facility])   Dementia with behavioral disturbance (HCC)   Body mass index is 23.82 kg/m.    Probable acute UTI patient with indwelling Foley catheter: Culture showed E. coli and Proteus mirabilis.  Susceptibility report is pending.  Continue IV Rocephin.  Hypertension: Continue antihypertensive  Hypothyroidism: Continue Synthroid  Frequent falls with recent right hip fracture in October, s/p ORIF: PT and OT recommended further rehabilitation at the skilled nursing facility.  Stage IV sacral decubitus ulcer: Present on admission.  Continue local wound care.  Moist gauze packing to promote healing, foam dressing to protect from further injury.  Poor social situation: Apparently, patient was found by her neighbor lying on the floor at home.  Social worker is assisting with placement.    Family Communication/Anticipated D/C date and plan/Code Status    DVT prophylaxis: Lovenox Code Status: DNR Family Communication: Plan discussed with the patient Disposition Plan: Discharge to nursing home in 2 to 3 days      Subjective:   C/o abdominal pain and low back pain from her wound but she is unable to provide any specifics  Objective:    Vitals:   02/03/19 1737 02/03/19 2109 02/04/19 0515 02/04/19 1000  BP: 130/63 (!) 130/52 (!) 124/52 (!) 121/58  Pulse: 68 79 72 76  Resp: 16 16 16 16   Temp: 98.2 F (36.8 C) 98.7 F (37.1 C) 99.1 F (37.3 C) 98.6 F (37 C)  TempSrc: Oral Oral Oral Oral  SpO2: 100% 98% 97% 98%  Weight:  61 kg    Height:        Intake/Output Summary (Last 24 hours) at 02/04/2019 1631 Last data filed at 02/04/2019 1315 Gross per 24 hour  Intake 506.67 ml  Output 0 ml  Net 506.67 ml   Filed Weights   02/03/19 0007 02/03/19 2109  Weight: 61.2 kg 61 kg    Exam:  GEN: NAD SKIN: Stage IV sacral decubitus ulcer EYES: EOMI ENT: MMM CV: RRR PULM: CTA B ABD: soft, ND, NT, +BS CNS: AAO x 3, non focal EXT: No edema or tenderness GU: Foley catheter draining amber urine   Data Reviewed:   I have personally reviewed following labs and imaging studies:  Labs: Labs show the following:   Basic Metabolic Panel: Recent Labs  Lab 02/02/19 2049 02/03/19 1104  NA 141 140  K 4.4 3.8  CL 107 106  CO2 24 25  GLUCOSE 154* 115*  BUN 26* 21  CREATININE 0.98 0.89  CALCIUM 9.3 8.9   GFR Estimated Creatinine Clearance: 34.8 mL/min (by C-G formula based on SCr of 0.89 mg/dL). Liver Function Tests: Recent Labs  Lab 02/02/19 2049  AST 23  ALT 10  ALKPHOS 91  BILITOT 0.9  PROT 6.9  ALBUMIN 2.7*   No results for input(s): LIPASE, AMYLASE in the last 168 hours. No results for input(s): AMMONIA in the last 168 hours. Coagulation profile No results for input(s): INR, PROTIME in the last 168 hours.  CBC: Recent Labs  Lab 02/02/19 2049 02/03/19 1104  WBC 18.5* 15.2*  NEUTROABS 16.4* 12.6*   HGB 11.7* 10.5*  HCT 37.2 32.9*  MCV 93.5 92.9  PLT 445* 395   Cardiac Enzymes: Recent Labs  Lab 02/02/19 2049  CKTOTAL 134   BNP (last 3 results) No results for input(s): PROBNP in the last 8760 hours. CBG: Recent Labs  Lab 02/03/19 0158  GLUCAP 123*   D-Dimer: No results for input(s): DDIMER in the last 72 hours. Hgb A1c: No results for input(s): HGBA1C in the last 72 hours. Lipid Profile: No results for input(s): CHOL, HDL, LDLCALC, TRIG, CHOLHDL, LDLDIRECT in the last 72 hours. Thyroid function studies: No results for input(s): TSH, T4TOTAL, T3FREE, THYROIDAB in the last 72 hours.  Invalid input(s): FREET3 Anemia work up: No results for input(s): VITAMINB12, FOLATE, FERRITIN, TIBC, IRON, RETICCTPCT in the last 72 hours. Sepsis Labs: Recent Labs  Lab 02/02/19 2049 02/03/19 1104  WBC 18.5* 15.2*    Microbiology Recent Results (from the past 240 hour(s))  Urine culture     Status: Abnormal (Preliminary result)   Collection Time: 02/02/19 11:12 PM   Specimen: Urine, Random  Result Value Ref Range Status   Specimen Description URINE, RANDOM  Final   Special Requests NONE  Final   Culture (A)  Final    >=100,000 COLONIES/mL PROTEUS MIRABILIS >=100,000 COLONIES/mL ESCHERICHIA COLI SUSCEPTIBILITIES TO FOLLOW Performed at [facility], [address], [place], [place] [address]    Report Status PENDING  Incomplete  SARS CORONAVIRUS 2 (TAT 6-24 HRS) Nasopharyngeal Nasopharyngeal Swab     Status: None   Collection Time: 02/03/19 10:49 AM   Specimen: Nasopharyngeal Swab  Result Value Ref Range Status   SARS Coronavirus 2 NEGATIVE NEGATIVE Final    Comment: (NOTE) SARS-CoV-2 target nucleic acids are NOT DETECTED. The SARS-CoV-2 RNA is generally detectable in upper and lower respiratory specimens during the acute phase of infection. Negative results do not preclude SARS-CoV-2 infection, do not rule out co-infections with other pathogens, and should not be  used as the sole basis for treatment or other patient management decisions. Negative results must be combined with clinical observations, patient history, and epidemiological information. The expected result is Negative. Fact Sheet for Patients: SugarRoll.be Fact Sheet for Healthcare Providers: https://www.woods-mathews.com/ This test is not yet approved or cleared by the [place] FDA and  has been authorized for detection and/or diagnosis of SARS-CoV-2 by FDA under an Emergency Use Authorization (EUA). This EUA will remain  in effect (meaning this test can be used) for the duration of the COVID-19 declaration under Section 56 4(b)(1) of the Act, 21 U.S.C. section 360bbb-3(b)(1), unless the authorization is terminated or revoked sooner. Performed at [facility], [place] [address], [place], [place] [address]     Procedures and diagnostic studies:  Dg Lumbar Spine Complete  Result Date: 02/02/2019 CLINICAL DATA:  Recent fall with low back pain, initial encounter EXAM: LUMBAR SPINE - COMPLETE 4+ VIEW COMPARISON:  MRI from 10/11/2011 FINDINGS: Stable L2 compression deformity is noted. Anterolisthesis of L4 on L5 is noted stable from the prior exam. Degenerative facet changes are seen. No acute fracture is noted. IMPRESSION: Chronic changes in the lumbar spine stable from previous exam. Electronically Signed   By: Inez Catalina M.D.   On: 02/02/2019 22:28   Dg Sacrum/coccyx  Result Date: 02/02/2019 CLINICAL DATA:  Recent fall with overlying sacral soft tissue wound, initial encounter EXAM: SACRUM AND COCCYX - 2+ VIEW COMPARISON:  None. FINDINGS: Pelvic ring is stable from previous hip exams. Prior fixation of the proximal femoral fractures are noted. Considerable artifact over the sacrum is seen. No definitive fracture is noted. If there is clinical concern CT would be helpful. IMPRESSION: No definitive fracture seen although considerable  artifact is noted due to the cross-table technique. If there is strong suspicion for sacral fracture CT could be performed. Electronically Signed   By: Inez Catalina M.D.   On: 02/02/2019 22:30   Ct Head Wo Contrast  Result Date: 02/02/2019 CLINICAL DATA:  fall unknown head or neck trauma; Head trauma, minor, GCS>=13, low clinical risk, initial exam fall unknown head or neck trauma Found by neighbor lying on the floor. Fall this morning. EXAM: CT HEAD WITHOUT CONTRAST CT CERVICAL SPINE WITHOUT CONTRAST TECHNIQUE: Multidetector CT imaging of the head and cervical spine was performed following the standard protocol without intravenous contrast. Multiplanar CT image reconstructions of the cervical spine were also generated. COMPARISON:  Head and cervical spine CT 12/12/2018 FINDINGS: CT HEAD FINDINGS Brain: No intracranial hemorrhage, mass effect, or midline shift. Unchanged degree of atrophy and chronic small vessel ischemia. 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 No hydrocephalus. The basilar cisterns are patent. No evidence of territorial infarct or acute ischemia. No extra-axial or intracranial fluid collection. Vascular: Atherosclerosis of skullbase vasculature without hyperdense vessel or abnormal calcification. Skull: No fracture or focal lesion. Sinuses/Orbits: Paranasal sinuses and mastoid air cells are clear. The visualized orbits are unremarkable. Bilateral cataract resection. Other: None. CT CERVICAL SPINE FINDINGS Alignment: No traumatic subluxation. Slight anterolisthesis of C5 on C6 and C4 on C5 and is unchanged. Skull base and vertebrae: No acute fracture. Vertebral body heights are maintained. The dens and skull base are intact. Soft tissues and spinal canal: No prevertebral fluid or swelling. No visible canal hematoma. Disc levels: Diffuse degenerative disc disease, most prominent at C6-C7. Multilevel facet hypertrophy. Degenerative changes are stable from prior. Upper chest:  Heterogeneous enlarged right lobe of the thyroid gland consistent with goiter. No dominant nodule. No further evaluation recommended given patient's advanced age. Carotid calcifications. Other: None. IMPRESSION: 1. No acute intracranial abnormality. No skull fracture. Stable atrophy and chronic small vessel ischemia. 2. Multilevel degenerative change in the cervical spine without acute fracture or subluxation. Electronically Signed   By: Keith Rake M.D.   On: 02/02/2019 21:54   Ct Cervical Spine Wo Contrast  Result Date: 02/02/2019 CLINICAL DATA:  fall unknown head or neck trauma; Head trauma, minor, GCS>=13, low clinical risk, initial exam fall unknown head or neck trauma Found by neighbor lying on the floor. Fall this morning. EXAM: CT HEAD WITHOUT CONTRAST CT CERVICAL SPINE WITHOUT CONTRAST TECHNIQUE: Multidetector CT imaging of the head and cervical spine was performed following the standard protocol without intravenous contrast. Multiplanar CT image reconstructions of the cervical spine were also generated. COMPARISON:  Head and cervical spine CT 12/12/2018 FINDINGS: CT HEAD FINDINGS Brain: No intracranial hemorrhage, mass effect, or midline shift.  Unchanged degree of atrophy and chronic small vessel ischemia. 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 No hydrocephalus. The basilar cisterns are patent. No evidence of territorial infarct or acute ischemia. No extra-axial or intracranial fluid collection. Vascular: Atherosclerosis of skullbase vasculature without hyperdense vessel or abnormal calcification. Skull: No fracture or focal lesion. Sinuses/Orbits: Paranasal sinuses and mastoid air cells are clear. The visualized orbits are unremarkable. Bilateral cataract resection. Other: None. CT CERVICAL SPINE FINDINGS Alignment: No traumatic subluxation. Slight anterolisthesis of C5 on C6 and C4 on C5 and is unchanged. Skull base and vertebrae: No acute fracture. Vertebral body heights are  maintained. The dens and skull base are intact. Soft tissues and spinal canal: No prevertebral fluid or swelling. No visible canal hematoma. Disc levels: Diffuse degenerative disc disease, most prominent at C6-C7. Multilevel facet hypertrophy. Degenerative changes are stable from prior. Upper chest: Heterogeneous enlarged right lobe of the thyroid gland consistent with goiter. No dominant nodule. No further evaluation recommended given patient's advanced age. Carotid calcifications. Other: None. IMPRESSION: 1. No acute intracranial abnormality. No skull fracture. Stable atrophy and chronic small vessel ischemia. 2. Multilevel degenerative change in the cervical spine without acute fracture or subluxation. Electronically Signed   By: Keith Rake M.D.   On: 02/02/2019 21:54   Ct Pelvis Wo Contrast  Result Date: 02/03/2019 CLINICAL DATA:  Pelvic trauma.  Fall at home last night.  Dementia. EXAM: CT PELVIS WITHOUT CONTRAST TECHNIQUE: Multidetector CT imaging of the pelvis was performed following the standard protocol without intravenous contrast. COMPARISON:  Plain films 02/02/2019, 12/12/2018 FINDINGS: Urinary Tract: Foley catheter present within the bladder which is decompressed. Visualized ureters unremarkable. Bowel: Moderate fecal retention over the rectum with minimal associated wall thickening. Remainder of the colon is unremarkable. Appendix and visualized small bowel are normal. Vascular/Lymphatic: Calcified plaque over the distal abdominal aorta and common iliac arteries. No adenopathy. Reproductive:  Previous hysterectomy. Other:  No free fluid. Musculoskeletal: Bilateral intramedullary nails over the femurs with associated compression screws bridging the femoral neck is into the femoral heads as hardware is intact. Evidence of patient's previous bilateral intertrochanteric fractures. No evidence of acute re-injury. Old left inferior pubic ramus fracture. No new acute fractures identified. Moderate  spondylosis of the lumbosacral spine with mild disc disease at the L3-4 and L4-5 levels. Mild-to-moderate canal stenosis at the L3-4 and L4-5 levels. IMPRESSION: 1.  No acute fractures. 2. Evidence patient's previous bilateral intertrochanteric hip fractures post fixation with hardware intact. Old left inferior pubic ramus fracture. 3. Moderate spondylosis of the spine with disc disease as described and mild-to-moderate canal stenosis at the L3-4 and L4-5 levels. 4.  Aortic Atherosclerosis (ICD10-I70.0). 5.  Moderate fecal retention over the rectum. Electronically Signed   By: Marin Olp M.D.   On: 02/03/2019 10:06   Dg Knee Complete 4 Views Left  Result Date: 02/02/2019 CLINICAL DATA:  Recent fall with left knee pain, initial encounter EXAM: LEFT KNEE - COMPLETE 4+ VIEW COMPARISON:  None. FINDINGS: Changes consistent with the prior medullary rod placement in the femur are seen. Mild irregularity in the proximal fibular shaft is noted consistent with prior healed fracture. No acute fracture or dislocation is noted. Degenerative changes of the knee joint are seen as well. No joint effusion is noted. IMPRESSION: Chronic changes without acute abnormality. Electronically Signed   By: Inez Catalina M.D.   On: 02/02/2019 22:25   Dg Knee Complete 4 Views Right  Result Date: 02/02/2019 CLINICAL DATA:  Recent fall with  right knee pain, initial encounter EXAM: RIGHT KNEE - COMPLETE 4+ VIEW COMPARISON:  None. FINDINGS: Changes consistent with prior fixation of the known femoral fracture are seen. Degenerative changes are noted particularly in the medial joint space. No acute fracture is seen. Small joint effusion is noted. IMPRESSION: Chronic changes without acute abnormality. Electronically Signed   By: Inez Catalina M.D.   On: 02/02/2019 22:27   Dg Hip Unilat With Pelvis 2-3 Views Left  Result Date: 02/02/2019 CLINICAL DATA:  Recent fall, initial encounter EXAM: DG HIP (WITH OR WITHOUT PELVIS) 3V LEFT  COMPARISON:  10/28/2018 FINDINGS: Changes consistent with prior proximal left femoral fracture are seen. Surgical fixation is noted. No new fracture is noted. Healing inferior pubic ramus fracture on the left is noted. No other focal abnormality is seen. IMPRESSION: Status post ORIF of proximal left femoral fracture. No acute abnormality is noted. Electronically Signed   By: Inez Catalina M.D.   On: 02/02/2019 22:23   Dg Hip Unilat With Pelvis 2-3 Views Right  Result Date: 02/02/2019 CLINICAL DATA:  Recent fall, initial encounter EXAM: DG HIP (WITH OR WITHOUT PELVIS) 3V RIGHT COMPARISON:  12/12/2018 FINDINGS: Previously seen right intratrochanteric fracture is again noted with prior surgical fixation. No dislocation is noted. No new definitive fracture is seen. Prior fixation of left femoral fracture is noted with some mild callus formation. Left inferior pubic ramus fracture with healing is noted as well. IMPRESSION: Changes of prior fractures bilaterally with surgical fixation. No new focal abnormality is seen. Electronically Signed   By: Inez Catalina M.D.   On: 02/02/2019 22:22   Dg Femur Min 2 Views Left  Result Date: 02/02/2019 CLINICAL DATA:  Recent fall with left leg pain, initial encounter EXAM: LEFT FEMUR 2 VIEWS COMPARISON:  10/27/2018 FINDINGS: Postsurgical changes are seen in the proximal left femur with mild healing. Old healed left inferior pubic ramus fracture is noted. No hardware abnormality is seen. No new femoral fracture is noted. IMPRESSION: Posttraumatic and postsurgical changes. No acute abnormality is noted. Electronically Signed   By: Inez Catalina M.D.   On: 02/02/2019 22:31    Medications:    aspirin  81 mg Oral Daily   Chlorhexidine Gluconate Cloth  6 each Topical Daily   docusate sodium  100 mg Oral BID   enoxaparin (LOVENOX) injection  40 mg Subcutaneous Q24H   gabapentin  100 mg Oral BID   levothyroxine  50 mcg Oral Daily   losartan  100 mg Oral Daily    Continuous Infusions:  cefTRIAXone (ROCEPHIN)  IV 1 g (02/04/19 1206)     LOS: 1 day   Brax Walen  [facility]   *Please refer to [email or web address], password [email or web address] to get updated schedule on who will round on this patient, as hospitalists switch teams weekly. If 7PM-7AM, please contact night-coverage at [email or web address], password [email or web address] for any overnight needs.  02/04/2019, 4:31 PM

## 2019-02-04 NOTE — TOC Initial Note (Signed)
Transition of Care [facility]) - Initial/Assessment Note    Patient Details  Name: [name] MRN: [id number] Date of Birth: 06-Dec-1926  Transition of Care [facility]) CM/SW Contact:    Bartholomew Crews, RN Phone Number: [phone] 02/04/2019, 12:32 PM  Clinical Narrative:                 Chart reviewed. Noted that patient may have APS case open. Telephone call to [place] [phone] and left voice mail with NCM and CSW information. TOC following for transition needs.   Expected Discharge Plan: Skilled Nursing Facility Barriers to Discharge: Continued Medical Work up   Patient Goals and CMS Choice        Expected Discharge Plan and Services Expected Discharge Plan: [place] In-house Referral: Clinical Social Work Discharge Planning Services: CM Consult                                          Prior Living Arrangements/Services   Lives with:: Self, Adult Children                   Activities of Daily Living      Permission Sought/Granted                  Emotional Assessment              Admission diagnosis:  Fall [W19.XXXA] Chronic indwelling Foley catheter [Z97.8] Acute cystitis without hematuria [N30.00] Fall, initial encounter [W19.XXXA] Patient Active Problem List   Diagnosis Date Noted  . UTI (urinary tract infection) due to urinary indwelling Foley catheter ([place]) 02/03/2019  . Hospital admission due to social situation 02/03/2019  . Stage IV pressure ulcer of sacral region ([place]) 02/03/2019  . Dementia with behavioral disturbance ([place]) 02/03/2019  . Closed nondisplaced intertrochanteric fracture of right femur ([place])   . Goals of care, counseling/discussion   . Palliative care by specialist   . DNR (do not resuscitate) discussion   . Closed right hip fracture ([place]) 12/12/2018  . Comminuted fracture of left hip ([place]) 10/27/2018  . Osteoarthritis 03/12/2018  . Overactive bladder 03/12/2018  . Carotid artery  stenosis 01/08/2018  . Vitamin D deficiency 01/08/2018  . Poor balance 10/16/2017  . Fall at home 10/15/2017  . Buttock pain 10/15/2017  . Contusion, buttock 10/15/2017  . Macular degeneration 03/20/2017  . Thickened nails 03/20/2017  . History of rectal bleeding 05/21/2016  . Eczema 05/16/2014  . Spinal stenosis of lumbar region 02/15/2014  . Right foot pain 09/23/2013  . Right shoulder pain 09/23/2013  . Other screening mammogram 04/12/2013  . Dizziness 05/21/2012  . Falls frequently 05/21/2012  . Urge incontinence 12/26/2011  . Hip pain, bilateral 09/15/2011  . Special screening for malignant neoplasms, colon 07/10/2010  . Hypothyroidism 04/30/2010  . CONSTIPATION 04/30/2010  . STRESS REACTION, ACUTE, WITH EMOTIONAL DISTURBANCE 10/09/2009  . Prediabetes 07/22/2009  . Malignant neoplasm of female breast ([place]) 12/29/2007  . Spinal stenosis, lumbar 06/22/2006  . PLUMMER'S DISEASE 06/16/2006  . Hyperlipidemia 06/16/2006  . ANXIETY 06/16/2006  . Essential hypertension 06/16/2006  . Osteopenia 06/16/2006   PCP:  McLean-Scocuzza, Nino Glow, MD Pharmacy:   [facility] [place] [place] [address] Phone: [phone] Fax: [phone]     Social Determinants of Health (SDOH) Interventions    Readmission Risk Interventions No flowsheet data  found.  

## 2019-02-04 NOTE — Consult Note (Addendum)
WOC Nurse Consult Note: Reason for Consult: Consult requested for sacrum Wound type: chronic stage 4 pressure injury to sacrum Pressure Injury POA: Yes Measurement: 7X5X1.4cm Wound bed: dark red-brown, bone visible Drainage (amount, consistency, odor) mod amt tan drainage, no odor Periwound: intact skin surrounding.  Pt is frequently incontinent of stool and it is difficult to prevent the wound location from becoming soiled. Dressing procedure/placement/frequency: Topical treatment orders provided for bedside nurses to perform daily as follows: moist gauze packing to promote healing, foam dressing to protect from further injury. Please re-consult if further assistance is needed.  Thank-you,  Julien Girt MSN, [place], [place], [place], [place]

## 2019-02-04 NOTE — Progress Notes (Signed)
Physical Therapy Evaluation Patient Details Name: [name] MRN: [id number] DOB: 1926-12-22 Today's Date: 02/04/2019   History of Present Illness   85 year old female s/p R IM nailing following R closed hip flexor a fall in her home.  PMH includes L IM nail, osteopenia, CA and spinal stenosis.  Clinical Impression  Pt was seen for mobility and noted her struggle to stand and scoot backward on the chair both from generalized weakness and sacral and heel wounds.  Her previous injuries of two hip/femoral IM nailings are far from recovered, and may be working toward a more permanent placement in SNF unless more consistent help can be obtained for home.  Follow acutely to continue to work on standing balance control and her loss of gait skills that made her more manageable for the family and caregiver.      Follow Up Recommendations SNF    Equipment Recommendations  None recommended by PT    Recommendations for Other Services       Precautions / Restrictions Precautions Precautions: Fall Precaution Comments: has been falling since second dc to home Restrictions Weight Bearing Restrictions: No      Mobility  Bed Mobility Overal bed mobility: Needs Assistance Bed Mobility: Rolling;Supine to Sit Rolling: Mod assist Sidelying to sit: Mod assist;+2 for physical assistance;+2 for safety/equipment Supine to sit: Mod assist;+2 for physical assistance;+2 for safety/equipment;HOB elevated     General bed mobility comments: mod assist to flex hips and roll to side, then to sit up from sidelying  Transfers Overall transfer level: Needs assistance Equipment used: Rolling walker (2 wheeled) Transfers: Sit to/from Stand Sit to Stand: Mod assist;+2 physical assistance;+2 safety/equipment;From elevated surface Stand pivot transfers: Mod assist;+2 physical assistance;+2 safety/equipment;From elevated surface       General transfer comment: tolerating stepping less on RLE than LLE,  struggling to stand and sidestep with LLE  Ambulation/Gait             General Gait Details: 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 [employer] (Stroke Patients Only)       Balance Overall balance assessment: Needs assistance Sitting-balance support: Bilateral upper extremity supported;Feet supported Sitting balance-Leahy Scale: Fair Sitting balance - Comments: requires close guard in sitting for balance     Standing balance-Leahy Scale: Poor Standing balance comment: tends to forward bend on walker for standing support                             Pertinent Vitals/Pain Pain Assessment: No/denies pain    Home Living Family/patient expects to be discharged to:: Private residence Living Arrangements: Children Available Help at Discharge: Friend(s);Family;Available PRN/intermittently Type of Home: House Home Access: Level entry Entrance Stairs-Rails: None   Home Layout: One level;Other (Comment) Home Equipment: Walker - 2 wheels Additional Comments: RW at home with help to transfers    Prior Function Level of Independence: Needs assistance   Gait / Transfers Assistance Needed: RW and wc with pt having previously used RW but not as much WC  ADL's / Homemaking Assistance Needed: has described caregiver help and son to assist        Hand Dominance   Dominant Hand: Right    Extremity/Trunk Assessment   Upper Extremity Assessment Upper Extremity Assessment: Defer to OT evaluation RUE Deficits / Details: edema noted at IV site  Lower Extremity Assessment Lower Extremity Assessment: Generalized weakness    Cervical / Trunk Assessment Cervical / Trunk Assessment: Kyphotic  Communication   Communication: No difficulties  Cognition Arousal/Alertness: Awake/alert Behavior During Therapy: WFL for tasks assessed/performed Overall Cognitive Status: No family/caregiver present to determine baseline  cognitive functioning                                 General Comments: pt has some cognitive changes but unclear as to how accurate her history is      General Comments General comments (skin integrity, edema, etc.): Talked with nursing about positioning in chair with pillows to make sacral wound NWB and the pending bowel movement that is beginning    Exercises     Assessment/Plan    PT Assessment Patient needs continued PT services  PT Problem List         PT Treatment Interventions      PT Goals (Current goals can be found in the Care Plan section)  Acute Rehab PT Goals Patient Stated Goal: none stated PT Goal Formulation: Patient unable to participate in goal setting Time For Goal Achievement: 02/18/19 Potential to Achieve Goals: Fair    Frequency Min 2X/week   Barriers to discharge        Co-evaluation   Reason for Co-Treatment: Complexity of the patient's impairments (multi-system involvement);To address functional/ADL transfers   OT goals addressed during session: ADL's and self-care       AM-PAC PT "6 Clicks" Mobility  Outcome Measure Help needed turning from your back to your side while in a flat bed without using bedrails?: A Lot Help needed moving from lying on your back to sitting on the side of a flat bed without using bedrails?: A Lot Help needed moving to and from a bed to a chair (including a wheelchair)?: A Lot Help needed standing up from a chair using your arms (e.g., wheelchair or bedside chair)?: Total Help needed to walk in hospital room?: Total Help needed climbing 3-5 steps with a railing? : Total 6 Click Score: 9    End of Session Equipment Utilized During Treatment: Gait belt Activity Tolerance: Patient limited by fatigue;Treatment limited secondary to medical complications (Comment) Patient left: in chair;with call bell/phone within reach;with chair alarm set Nurse Communication: Mobility status PT Visit Diagnosis:  Other abnormalities of gait and mobility (R26.89);Unsteadiness on feet (R26.81);Muscle weakness (generalized) (M62.81);History of falling (Z91.81);Adult, failure to thrive (R62.7)    Time: 1200-1242 PT Time Calculation (min) (ACUTE ONLY): 42 min   Charges:   PT Evaluation $PT Eval Moderate Complexity: 1 Mod PT Treatments $Therapeutic Activity: 8-22 mins       Ramond Dial 02/04/2019, 3:01 PM   Mee Hives, PT MS Acute Rehab Dept. Number: [place] [facility] [place]

## 2019-02-04 NOTE — Evaluation (Signed)
Occupational Therapy Evaluation Patient Details Name: [name] MRN: [id number] DOB: December 20, 1926 Today's Date: 02/04/2019    History of Present Illness  85 year old female s/p R IM nailing following R closed hip flexor a fall in her home.  PMH includes L IM nail, osteopenia, CA and spinal stenosis.   Clinical Impression   Pt PTA: recently d/c home from SNF with 24/7 care not available. Pt was ambulatory with RW, but multiple recent falls leading to admission here.Pt limited by poor activity tolerance, decreased awareness of deficits and decreased ability to care for self. Pt minA for grooming; set-upA for feeding in sitting; modA+2 for LB ADL in standing. Pt modA+2 for transfers and mobility with RW. Pt would greatly benefit from continued OT skilled services for ADL, mobility and safety in SNF setting. OT following acutely.    Follow Up Recommendations  SNF;Supervision/Assistance - 24 hour    Equipment Recommendations  3 in 1 bedside commode    Recommendations for Other Services       Precautions / Restrictions Precautions Precautions: Fall Precaution Comments: has been falling since second dc to home Restrictions Weight Bearing Restrictions: No      Mobility Bed Mobility Overal bed mobility: (P) Needs Assistance Bed Mobility: (P) Rolling;Supine to Sit Rolling: Mod assist Sidelying to sit: Mod assist;+2 for physical assistance;+2 for safety/equipment       General bed mobility comments: modA +2 for trunk elevation and management of BLE movements  Transfers Overall transfer level: Needs assistance Equipment used: Rolling walker (2 wheeled) Transfers: Sit to/from [facility] Sit to Stand: Mod assist;+2 physical assistance;+2 safety/equipment Stand pivot transfers: Mod assist;+2 physical assistance;+2 safety/equipment       General transfer comment: Assist for stability in standing to progress legs. BLEs very weak, limited to 3-4 steps taken     Balance Overall balance assessment: Needs assistance Sitting-balance support: Bilateral upper extremity supported Sitting balance-Leahy Scale: Fair       Standing balance-Leahy Scale: Poor Standing balance comment: BUE dependent                           ADL either performed or assessed with clinical judgement   ADL Overall ADL's : Needs assistance/impaired Eating/Feeding: Set up;Sitting   Grooming: Minimal assistance;Sitting Grooming Details (indicate cue type and reason): unable to get comb completely through all of hair Upper Body Bathing: Minimal assistance;Sitting   Lower Body Bathing: Moderate assistance;+2 for physical assistance;+2 for safety/equipment;Sitting/lateral leans;Sit to/from stand;Cueing for safety   Upper Body Dressing : Minimal assistance;Cueing for safety;Sitting;Standing   Lower Body Dressing: Moderate assistance;+2 for physical assistance;+2 for safety/equipment;Sit to/from stand;Sitting/lateral leans   Toilet Transfer: Moderate assistance;+2 for safety/equipment;+2 for physical assistance;Stand-pivot;BSC Toilet Transfer Details (indicate cue type and reason): taking a few steps staggering, safer with +2 Toileting- Clothing Manipulation and Hygiene: Moderate assistance;+2 for physical assistance;+2 for safety/equipment;Cueing for safety;Sitting/lateral lean;Sit to/from stand       Functional mobility during ADLs: Moderate assistance;+2 for physical assistance;+2 for safety/equipment;Cueing for safety;Rolling walker General ADL Comments: Pt limited by poor activity tolerance, decreased awareness of deficits and decreased ability to care for self.     Vision Baseline Vision/History: No visual deficits Vision Assessment?: No apparent visual deficits     Perception     Praxis      Pertinent Vitals/Pain Pain Assessment: (P) No/denies pain     Hand Dominance (P) Right   Extremity/Trunk Assessment Upper Extremity Assessment Upper  Extremity  Assessment: (P) Defer to OT evaluation RUE Deficits / Details: edema noted at IV site   Lower Extremity Assessment Lower Extremity Assessment: (P) Generalized weakness   Cervical / Trunk Assessment Cervical / Trunk Assessment: (P) Kyphotic   Communication Communication Communication: (P) No difficulties   Cognition Arousal/Alertness: (P) Awake/alert Behavior During Therapy: (P) WFL for tasks assessed/performed Overall Cognitive Status: (P) No family/caregiver present to determine baseline cognitive functioning                                 General Comments: (P) pt has some cognitive changes but unclear as to how accurate her history is   General Comments  Rn education on needing food to be chopped; and sitting up in recliner ~2 hours due to sore on bottom.    Exercises     Shoulder Instructions      Home Living Family/patient expects to be discharged to:: Private residence Living Arrangements: Children Available Help at Discharge: Friend(s);Family;Available PRN/intermittently Type of Home: House Home Access: Level entry   Entrance Stairs-Rails: None Home Layout: One level;Other (Comment)     Bathroom Shower/Tub: Occupational psychologist: Standard     Home Equipment: Environmental consultant - 2 wheels   Additional Comments: RW at home with help to transfers      Prior Functioning/Environment Level of Independence: Needs assistance  Gait / Transfers Assistance Needed: (P) RW and wc with pt having previously used RW but not as much WC ADL's / Homemaking Assistance Needed: (P) has described caregiver help and son to assist            OT Problem List: Decreased strength;Decreased activity tolerance;Decreased safety awareness;Pain;Impaired balance (sitting and/or standing);Decreased cognition;Decreased coordination;Impaired vision/perception;Decreased knowledge of use of DME or AE;Impaired UE functional use;Increased edema      OT  Treatment/Interventions: Self-care/ADL training;Therapeutic exercise;Neuromuscular education;Energy conservation;Therapeutic activities;Patient/family education;Balance training    OT Goals(Current goals can be found in the care plan section) Acute Rehab OT Goals Patient Stated Goal: to get better OT Goal Formulation: With patient Time For Goal Achievement: 02/18/19 Potential to Achieve Goals: Good ADL Goals Pt Will Perform Grooming: with set-up;sitting Pt Will Perform Upper Body Dressing: with set-up;sitting Pt Will Perform Lower Body Dressing: with min assist;sit to/from stand;sitting/lateral leans Pt Will Transfer to Toilet: with min assist;stand pivot transfer;bedside commode Pt Will Perform Toileting - Clothing Manipulation and hygiene: sitting/lateral leans;sit to/from stand;with min assist Pt/caregiver will Perform Home Exercise Program: Increased strength;Both right and left upper extremity  OT Frequency: Min 2X/week   Barriers to D/C:            Co-evaluation PT/OT/SLP Co-Evaluation/Treatment: Yes Reason for Co-Treatment: Complexity of the patient's impairments (multi-system involvement);To address functional/ADL transfers   OT goals addressed during session: ADL's and self-care      AM-PAC OT "6 Clicks" Daily Activity     Outcome Measure Help from another person eating meals?: A Little Help from another person taking care of personal grooming?: A Little Help from another person toileting, which includes using toliet, bedpan, or urinal?: A Lot Help from another person bathing (including washing, rinsing, drying)?: A Lot Help from another person to put on and taking off regular upper body clothing?: A Lot Help from another person to put on and taking off regular lower body clothing?: Total 6 Click Score: 13   End of Session Equipment Utilized During Treatment: Gait belt;Rolling walker Nurse Communication: Mobility status  Activity Tolerance: Patient tolerated  treatment well Patient left: in chair;with call bell/phone within reach;with chair alarm set  OT Visit Diagnosis: Unsteadiness on feet (R26.81);Muscle weakness (generalized) (M62.81)                Time: [id number] OT Time Calculation (min): 39 min Charges:  OT General Charges $OT Visit: 1 Visit OT Evaluation $OT Eval Moderate Complexity: 1 Mod OT Treatments $Self Care/Home Management : 8-22 mins  Ebony Hail Harold Hedge) Marsa Aris OTR/L Acute Rehabilitation Services Pager: [phone] Office: [phone] 02/04/2019, 1:52 PM

## 2019-02-04 NOTE — Plan of Care (Signed)
  Problem: Education: Goal: Knowledge of General Education information will improve Description: Including pain rating scale, medication(s)/side effects and non-pharmacologic comfort measures Outcome: Progressing   Problem: Safety: Goal: Ability to remain free from injury will improve Outcome: Progressing   

## 2019-02-04 NOTE — TOC Progression Note (Signed)
Transition of Care [facility]) - Progression Note    Patient Details  Name: [name] MRN: [id number] Date of Birth: October 20, 1926  Transition of Care [facility]) CM/SW Contact  Bartholomew Crews, RN Phone Number: [phone] 02/04/2019, 3:12 PM  Clinical Narrative:    Received call back from [place]. Office is closed for the holiday. Patient name, DOB, and situation verified - DSS to follow up with concerns.   Expected Discharge Plan: Skilled Nursing Facility Barriers to Discharge: Continued Medical Work up  Expected Discharge Plan and Services Expected Discharge Plan: [place] In-house Referral: Clinical Social Work Discharge Planning Services: CM Consult                                           Social Determinants of Health (SDOH) Interventions    Readmission Risk Interventions No flowsheet data found.

## 2019-02-05 LAB — URINE CULTURE: Culture: >=100000 — AB

## 2019-02-05 LAB — CBC
HCT: 29.5 % — ABNORMAL LOW (ref 36.0–46.0)
Hemoglobin: 9.2 g/dL — ABNORMAL LOW (ref 12.0–15.0)
MCH: 29 pg (ref 26.0–34.0)
MCHC: 31.2 g/dL (ref 30.0–36.0)
MCV: 93.1 fL (ref 80.0–100.0)
Platelets: 350 10*3/uL (ref 150–400)
RBC: 3.17 MIL/uL — ABNORMAL LOW (ref 3.87–5.11)
RDW: 16.5 % — ABNORMAL HIGH (ref 11.5–15.5)
WBC: 11.1 10*3/uL — ABNORMAL HIGH (ref 4.0–10.5)
nRBC: 0 % (ref 0.0–0.2)

## 2019-02-05 MED ORDER — CEFDINIR 300 MG PO CAPS
300.0000 mg | ORAL_CAPSULE | Freq: Two times a day (BID) | ORAL | Status: AC
  Administered 2019-02-06 – 2019-02-08 (×6): 300 mg via ORAL
  Filled 2019-02-05 (×6): qty 1

## 2019-02-05 NOTE — Progress Notes (Signed)
Progress Note    SAVEA FULD  [id number] DOB: 01-Apr-1926  DOA: 02/02/2019 PCP: McLean-Scocuzza, Nino Glow, MD      Brief Narrative:    Medical records reviewed and are as summarized below:  [name] is an 85 y.o. female with medical history significant for hypothyroidism, hypertension, hyperlipidemia, diabetes mellitus, multiple falls, recent bilateral hip fractures requiring surgery, stage IV sacral decubitus ulcer, urinary retention requiring Foley catheter, anemia, dementia, breast and uterine cancer.  She was recently discharged from [facility] on 01/28/2019 but apparently family has had a difficult time taking care of the patient. She was brought to the hospital after she was found on the floor at home.     Assessment/Plan:   Principal Problem:   UTI (urinary tract infection) due to urinary indwelling Foley catheter [facility]) Active Problems:   Essential hypertension   Hypothyroidism   Falls frequently   Hospital admission due to social situation   Stage IV pressure ulcer of sacral region [facility])   Dementia with behavioral disturbance (HCC)   Body mass index is 23.82 kg/m.    Probable acute UTI in a patient with indwelling Foley catheter: Culture showed E. coli and Proteus mirabilis.  Change IV Rocephin to Omnicef based on sensitivity reports.  Hypertension: Continue losartan  Hypothyroidism: Continue Synthroid  Frequent falls with recent right hip fracture in October, s/p ORIF: PT and OT recommended further rehabilitation at the skilled nursing facility.  Stage IV sacral decubitus ulcer: Present on admission.  Continue local wound care.  Moist gauze packing to promote healing, foam dressing to protect from further injury.  Poor social situation: Apparently, patient was found by her neighbor lying on the floor at home.  Social worker is assisting with placement.    Family Communication/Anticipated D/C date and plan/Code Status    DVT prophylaxis: Lovenox Code Status: DNR Family Communication: Plan discussed with the patient Disposition Plan: Discharge to nursing home in 2 to 3 days      Subjective:   Complains of pain in her buttocks from the wound.  No abdominal pain.  Objective:    Vitals:   02/04/19 1637 02/04/19 2032 02/05/19 0504 02/05/19 0909  BP: (!) 126/57 (!) 155/64 (!) 154/58 137/67  Pulse: 80 68 67 66  Resp: 18 15 15 18   Temp: 98.4 F (36.9 C) 98.2 F (36.8 C) 98.2 F (36.8 C) 98.9 F (37.2 C)  TempSrc: Oral Oral  Oral  SpO2: 97% 99% 97% 99%  Weight:  61 kg    Height:        Intake/Output Summary (Last 24 hours) at 02/05/2019 1505 Last data filed at 02/05/2019 1320 Gross per 24 hour  Intake 420 ml  Output 550 ml  Net -130 ml   Filed Weights   02/03/19 0007 02/03/19 2109 02/04/19 2032  Weight: 61.2 kg 61 kg 61 kg    Exam:  GEN: NAD SKIN: Stage IV sacral decubitus ulcer EYES: No pallor or icterus ENT: MMM CV: RRR PULM: CTA B ABD: soft, ND, NT, +BS CNS: AAO x 3, non focal EXT: No edema or tenderness GU: Foley catheter draining amber urine    Data Reviewed:   I have personally reviewed following labs and imaging studies:  Labs: Labs show the following:   Basic Metabolic Panel: Recent Labs  Lab 02/02/19 2049 02/03/19 1104  NA 141 140  K 4.4 3.8  CL 107 106  CO2 24 25  GLUCOSE 154* 115*  BUN  26* 21  CREATININE 0.98 0.89  CALCIUM 9.3 8.9   GFR Estimated Creatinine Clearance: 34.8 mL/min (by C-G formula based on SCr of 0.89 mg/dL). Liver Function Tests: Recent Labs  Lab 02/02/19 2049  AST 23  ALT 10  ALKPHOS 91  BILITOT 0.9  PROT 6.9  ALBUMIN 2.7*   No results for input(s): LIPASE, AMYLASE in the last 168 hours. No results for input(s): AMMONIA in the last 168 hours. Coagulation profile No results for input(s): INR, PROTIME in the last 168 hours.  CBC: Recent Labs  Lab 02/02/19 2049 02/03/19 1104 02/05/19 0517  WBC 18.5* 15.2* 11.1*   NEUTROABS 16.4* 12.6*  --   HGB 11.7* 10.5* 9.2*  HCT 37.2 32.9* 29.5*  MCV 93.5 92.9 93.1  PLT 445* 395 350   Cardiac Enzymes: Recent Labs  Lab 02/02/19 2049  CKTOTAL 134   BNP (last 3 results) No results for input(s): PROBNP in the last 8760 hours. CBG: Recent Labs  Lab 02/03/19 0158  GLUCAP 123*   D-Dimer: No results for input(s): DDIMER in the last 72 hours. Hgb A1c: No results for input(s): HGBA1C in the last 72 hours. Lipid Profile: No results for input(s): CHOL, HDL, LDLCALC, TRIG, CHOLHDL, LDLDIRECT in the last 72 hours. Thyroid function studies: No results for input(s): TSH, T4TOTAL, T3FREE, THYROIDAB in the last 72 hours.  Invalid input(s): FREET3 Anemia work up: No results for input(s): VITAMINB12, FOLATE, FERRITIN, TIBC, IRON, RETICCTPCT in the last 72 hours. Sepsis Labs: Recent Labs  Lab 02/02/19 2049 02/03/19 1104 02/05/19 0517  WBC 18.5* 15.2* 11.1*    Microbiology Recent Results (from the past 240 hour(s))  Urine culture     Status: Abnormal   Collection Time: 02/02/19 11:12 PM   Specimen: Urine, Random  Result Value Ref Range Status   Specimen Description URINE, RANDOM  Final   Special Requests   Final    NONE Performed at [facility], [address], [place], [place] [address]    Culture (A)  Final    >=100,000 COLONIES/mL PROTEUS MIRABILIS >=100,000 COLONIES/mL ESCHERICHIA COLI    Report Status 02/05/2019 FINAL  Final   Organism ID, Bacteria PROTEUS MIRABILIS (A)  Final   Organism ID, Bacteria ESCHERICHIA COLI (A)  Final      Susceptibility   Escherichia coli - MIC*    AMPICILLIN >=32 RESISTANT Resistant     CEFAZOLIN >=64 RESISTANT Resistant     CEFTRIAXONE 8 SENSITIVE Sensitive     CIPROFLOXACIN <=0.25 SENSITIVE Sensitive     GENTAMICIN <=1 SENSITIVE Sensitive     IMIPENEM <=0.25 SENSITIVE Sensitive     NITROFURANTOIN <=16 SENSITIVE Sensitive     TRIMETH/SULFA >=320 RESISTANT Resistant     AMPICILLIN/SULBACTAM >=32  RESISTANT Resistant     PIP/TAZO <=4 SENSITIVE Sensitive     Extended ESBL NEGATIVE Sensitive     * >=100,000 COLONIES/mL ESCHERICHIA COLI   Proteus mirabilis - MIC*    AMPICILLIN <=2 SENSITIVE Sensitive     CEFAZOLIN <=4 SENSITIVE Sensitive     CEFTRIAXONE <=1 SENSITIVE Sensitive     CIPROFLOXACIN <=0.25 SENSITIVE Sensitive     GENTAMICIN <=1 SENSITIVE Sensitive     IMIPENEM 2 SENSITIVE Sensitive     NITROFURANTOIN 128 RESISTANT Resistant     TRIMETH/SULFA <=20 SENSITIVE Sensitive     AMPICILLIN/SULBACTAM <=2 SENSITIVE Sensitive     PIP/TAZO <=4 SENSITIVE Sensitive     * >=100,000 COLONIES/mL PROTEUS MIRABILIS  SARS CORONAVIRUS 2 (TAT 6-24 HRS) Nasopharyngeal Nasopharyngeal  Swab     Status: None   Collection Time: 02/03/19 10:49 AM   Specimen: Nasopharyngeal Swab  Result Value Ref Range Status   SARS Coronavirus 2 NEGATIVE NEGATIVE Final    Comment: (NOTE) SARS-CoV-2 target nucleic acids are NOT DETECTED. The SARS-CoV-2 RNA is generally detectable in upper and lower respiratory specimens during the acute phase of infection. Negative results do not preclude SARS-CoV-2 infection, do not rule out co-infections with other pathogens, and should not be used as the sole basis for treatment or other patient management decisions. Negative results must be combined with clinical observations, patient history, and epidemiological information. The expected result is Negative. Fact Sheet for Patients: SugarRoll.be Fact Sheet for Healthcare Providers: https://www.woods-mathews.com/ This test is not yet approved or cleared by the [place] FDA and  has been authorized for detection and/or diagnosis of SARS-CoV-2 by FDA under an Emergency Use Authorization (EUA). This EUA will remain  in effect (meaning this test can be used) for the duration of the COVID-19 declaration under Section 56 4(b)(1) of the Act, 21 U.S.C. section 360bbb-3(b)(1), unless  the authorization is terminated or revoked sooner. Performed at [facility], [place] [address] [place], [place] [address]     Procedures and diagnostic studies:  No results found.  Medications:   . aspirin  81 mg Oral Daily  . [START ON 02/06/2019] cefdinir  300 mg Oral Q12H  . Chlorhexidine Gluconate Cloth  6 each Topical Daily  . docusate sodium  100 mg Oral BID  . enoxaparin (LOVENOX) injection  40 mg Subcutaneous Q24H  . gabapentin  100 mg Oral BID  . levothyroxine  50 mcg Oral Daily  . losartan  100 mg Oral Daily   Continuous Infusions:    LOS: 2 days   Mayah Urquidi  [facility]   *Please refer to [email or web address], password [email or web address] to get updated schedule on who will round on this patient, as hospitalists switch teams weekly. If 7PM-7AM, please contact night-coverage at [email or web address], password [email or web address] for any overnight needs.  02/05/2019, 3:05 PM

## 2019-02-06 NOTE — TOC Progression Note (Signed)
Transition of Care [facility]) - Progression Note    Patient Details  Name: [name] MRN: [id number] Date of Birth: 07/30/1926  Transition of Care [facility]) CM/SW [place], [place] Phone Number: 02/06/2019, 12:58 PM  Clinical Narrative:     CSW received a return phone call from the patient's granddaughter, Margreta Journey. She is concerned about her grandmother's well being. She stated she has a private care giver to come in and help her. She stated that when the caregiver is off, her father is supposed to watch the patient since he lives next door. She stated she does not have a relationship with her father and he does not communicate with her. She stated that her grandmother has been to [facility] in the past and liked the care she received. [facility] would be a second choice for her. CSW obtained permission to fax the patient out.   CSW asked about open APS case. She stated that her grandmother has an open APS case in [facility]. [place] A[place] SW is [name] and her phone number is [phone].   CSW faxed the patient out to [facility] and [facility]. CSW provided CMS SNF via email. CSW will continue to follow and assist with TOC needs.   Expected Discharge Plan: Skilled Nursing Facility Barriers to Discharge: Continued Medical Work up  Expected Discharge Plan and Services Expected Discharge Plan: [place] In-house Referral: Clinical Social Work Discharge Planning Services: CM Consult                                           Social Determinants of Health (SDOH) Interventions    Readmission Risk Interventions No flowsheet data found.

## 2019-02-06 NOTE — NC FL2 (Signed)
[place] MEDICAID FL2 LEVEL OF CARE SCREENING TOOL     IDENTIFICATION  Patient Name: [name] Birthdate: 08/17/1926 Sex: female Admission Date (Current Location): 02/02/2019  [address] [facility] and [place] Number:  Herbalist and Address:  [facility] [place], [place] [address], [place], [place] [address]      Provider Number: [id number]  Attending Physician Name and Address:  Jonnie Finner, DO  Relative Name and Phone Number:  Delaiah, Pam, [phone] & Dorisann Frames, Granddaughter, [phone]    Current Level of Care: Hospital Recommended Level of Care: [place] Prior Approval Number:    Date Approved/Denied:   PASRR Number:    Discharge Plan: SNF    Current Diagnoses: Patient Active Problem List   Diagnosis Date Noted  . UTI (urinary tract infection) due to urinary indwelling Foley catheter ([place]) 02/03/2019  . Hospital admission due to social situation 02/03/2019  . Stage IV pressure ulcer of sacral region ([place]) 02/03/2019  . Dementia with behavioral disturbance ([place]) 02/03/2019  . Closed nondisplaced intertrochanteric fracture of right femur ([place])   . Goals of care, counseling/discussion   . Palliative care by specialist   . DNR (do not resuscitate) discussion   . Closed right hip fracture ([place]) 12/12/2018  . Comminuted fracture of left hip ([place]) 10/27/2018  . Osteoarthritis 03/12/2018  . Overactive bladder 03/12/2018  . Carotid artery stenosis 01/08/2018  . Vitamin D deficiency 01/08/2018  . Poor balance 10/16/2017  . Fall at home 10/15/2017  . Buttock pain 10/15/2017  . Contusion, buttock 10/15/2017  . Macular degeneration 03/20/2017  . Thickened nails 03/20/2017  . History of rectal bleeding 05/21/2016  . Eczema 05/16/2014  . Spinal stenosis of lumbar region 02/15/2014  . Right foot pain 09/23/2013  . Right shoulder pain 09/23/2013  . Other screening mammogram 04/12/2013  . Dizziness 05/21/2012  . Falls  frequently 05/21/2012  . Urge incontinence 12/26/2011  . Hip pain, bilateral 09/15/2011  . Special screening for malignant neoplasms, colon 07/10/2010  . Hypothyroidism 04/30/2010  . CONSTIPATION 04/30/2010  . STRESS REACTION, ACUTE, WITH EMOTIONAL DISTURBANCE 10/09/2009  . Prediabetes 07/22/2009  . Malignant neoplasm of female breast ([place]) 12/29/2007  . Spinal stenosis, lumbar 06/22/2006  . PLUMMER'S DISEASE 06/16/2006  . Hyperlipidemia 06/16/2006  . ANXIETY 06/16/2006  . Essential hypertension 06/16/2006  . Osteopenia 06/16/2006    Orientation RESPIRATION BLADDER Height & Weight     Self, Place  Normal Incontinent, Indwelling catheter Weight: 134 lb 7.7 oz (61 kg) Height:  5\' 3"  (160 cm)  BEHAVIORAL SYMPTOMS/MOOD NEUROLOGICAL BOWEL NUTRITION STATUS      Continent Diet(regular diet, thin liquids)  AMBULATORY STATUS COMMUNICATION OF NEEDS Skin   Limited Assist Verbally Bruising, Other (Comment), Skin abrasions(Bruising on left/right arm, MASD on groin/sacrum, pressure wound on left heel (PRN dressing change), pressure wound on sacrum stage 4 (PRN dressing change))                       Personal Care Assistance Level of Assistance  Bathing, Feeding, Dressing Bathing Assistance: Maximum assistance Feeding assistance: Limited assistance Dressing Assistance: Maximum assistance     Functional Limitations Info  Sight, Hearing, Speech Sight Info: Adequate Hearing Info: Adequate Speech Info: Adequate    SPECIAL CARE FACTORS FREQUENCY  PT (By licensed PT), OT (By licensed OT)     PT Frequency: 5x/wk OT Frequency: 5x/wk            Contractures Contractures Info:  Not present    Additional Factors Info  Code Status, Allergies Code Status Info: DNR Allergies Info: Amlodipine, Atenolol, Erythromycin Base, Lisinopril           Current Medications (02/06/2019):  This is the current hospital active medication list Current Facility-Administered Medications   Medication Dose Route Frequency Provider Last Rate Last Dose  . acetaminophen (TYLENOL) tablet 650 mg  650 mg Oral Q6H PRN Karmen Bongo, MD   650 mg at 02/05/19 2250   Or  . acetaminophen (TYLENOL) suppository 650 mg  650 mg Rectal Q6H PRN Karmen Bongo, MD      . aspirin chewable tablet 81 mg  81 mg Oral Daily Karmen Bongo, MD   81 mg at 02/06/19 1057  . cefdinir (OMNICEF) capsule 300 mg  300 mg Oral Q12H Jennye Boroughs, MD   300 mg at 02/06/19 1057  . Chlorhexidine Gluconate Cloth 2 % PADS 6 each  6 each Topical Daily Karmen Bongo, MD   6 each at 02/06/19 1101  . docusate sodium (COLACE) capsule 100 mg  100 mg Oral BID Karmen Bongo, MD   100 mg at 02/06/19 1056  . enoxaparin (LOVENOX) injection 40 mg  40 mg Subcutaneous Q24H Karmen Bongo, MD   40 mg at 02/05/19 1645  . gabapentin (NEURONTIN) capsule 100 mg  100 mg Oral BID Karmen Bongo, MD   100 mg at 02/06/19 1057  . levothyroxine (SYNTHROID) tablet 50 mcg  50 mcg Oral Daily Karmen Bongo, MD   50 mcg at 02/06/19 0548  . losartan (COZAAR) tablet 100 mg  100 mg Oral Daily Karmen Bongo, MD   100 mg at 02/06/19 1057  . ondansetron (ZOFRAN) tablet 4 mg  4 mg Oral Q6H PRN Karmen Bongo, MD       Or  . ondansetron [facility]) injection 4 mg  4 mg Intravenous Q6H PRN Karmen Bongo, MD      . polyethylene glycol (MIRALAX / GLYCOLAX) packet 17 g  17 g Oral Daily PRN Karmen Bongo, MD      . traMADol Veatrice Bourbon) tablet 50 mg  50 mg Oral Q6H PRN Karmen Bongo, MD   50 mg at 02/05/19 0444     Discharge Medications: Please see discharge summary for a list of discharge medications.  Relevant Imaging Results:  Relevant Lab Results:   Additional Information SSN: [id number]  Philippa Chester Avriel Kandel, LCSWA

## 2019-02-06 NOTE — TOC Progression Note (Signed)
Transition of Care [facility]) - Progression Note    Patient Details  Name: [name] MRN: [id number] Date of Birth: 1927-01-11  Transition of Care [facility]) CM/SW [place], [place] Phone Number: 02/06/2019, 12:45 PM  Clinical Narrative:     CSW attempted to contact patient son and granddaughter to complete assessment. CSW left voicemail's for both asking for a return phone call.   CSW will need to follow up with [name] regarding patient's PASSR number not matching in the system. CSW will do that on Monday.  CSW will continue to follow and assist with TOC needs.   Expected Discharge Plan: Skilled Nursing Facility Barriers to Discharge: Continued Medical Work up  Expected Discharge Plan and Services Expected Discharge Plan: [place] In-house Referral: Clinical Social Work Discharge Planning Services: CM Consult                                           Social Determinants of Health (SDOH) Interventions    Readmission Risk Interventions No flowsheet data found.

## 2019-02-06 NOTE — Progress Notes (Signed)
[facility]  PROGRESS NOTE    [name]  [id number] DOB: September 09, 1926 DOA: 02/02/2019 PCP: McLean-Scocuzza, Nino Glow, MD   Brief Narrative:   [name] is an 85 y.o. female with medical history significant for hypothyroidism, hypertension, hyperlipidemia, diabetes mellitus, multiple falls, recent bilateral hip fractures requiring surgery, stage IV sacral decubitus ulcer, urinary retention requiring Foley catheter, anemia, dementia, breast and uterine cancer.  She was [facility] on [facility] but apparently family has had a difficult time taking care of the patient. She was brought to the hospital after she was found on the floor at home.  11/29: Pleasant this AM. Denies complaints. Says she ate well.    Assessment & Plan:   Principal Problem:   UTI (urinary tract infection) due to urinary indwelling Foley catheter [facility]) Active Problems:   Essential hypertension   Hypothyroidism   Falls frequently   Hospital admission due to social situation   Stage IV pressure ulcer of sacral region [facility])   Dementia with behavioral disturbance ([place])  Acute UTI in a patient with indwelling Foley catheter     - UCx: E. coli and Proteus mirabilis sensative to cetriaxone, cipro.     - IV Rocephin was transitioned to [facility] based on sensitivity reports.     - she is stable, monitor  Hypertension     - continue losartan; BP is acceptable  Hypothyroidism      - continue synthroid  Frequent falls with recent right hip fracture in October, s/p ORIF     - PT/OT: SNF rehab  Stage IV sacral decubitus ulcer (POA).       - Continue local wound care.       - Moist gauze packing to promote healing, foam dressing to protect from further injury.     - turns as prescribed  Poor social situation     - Apparently, patient was found by her neighbor lying on the floor at home.       - open APS case     - CM/SW onboard; appreciate assistance  DVT  prophylaxis: lovenox Code Status: DNR   Disposition Plan: TBD  Antimicrobials:  . omnicef   ROS:  Denies CP, dyspnea, N, V . Remainder 10-pt ROS is negative for all not previously mentioned.  Subjective: "I ate all of that. It was ok."  Objective: Vitals:   02/05/19 1652 02/05/19 2040 02/06/19 0523 02/06/19 1000  BP: (!) 144/73 (!) 170/82 (!) 150/57 (!) 147/65  Pulse: (!) 59 65 64 66  Resp: 18 18 16 18   Temp: 97.8 F (36.6 C) 98.2 F (36.8 C) 98.1 F (36.7 C) 98.2 F (36.8 C)  TempSrc: Oral Oral Oral Oral  SpO2: 100% 99% 98% 99%  Weight:      Height:        Intake/Output Summary (Last 24 hours) at 02/06/2019 1601 Last data filed at 02/06/2019 1435 Gross per 24 hour  Intake 660 ml  Output 1050 ml  Net -390 ml   Filed Weights   02/03/19 0007 02/03/19 2109 02/04/19 2032  Weight: 61.2 kg 61 kg 61 kg    Examination:  General: 85 y.o. female resting in bed in NAD Cardiovascular: RRR, +S1, S2, no m/g/r, equal pulses throughout Respiratory: CTABL, no w/r/r, normal WOB GI: BS+, NDNT, no masses noted, no organomegaly noted MSK: No e/c/c Neuro: alert to name, follows commands Psyc: Appropriate interaction and affect, calm/cooperative   Data Reviewed: I have personally reviewed following  labs and imaging studies.  CBC: Recent Labs  Lab 02/02/19 2049 02/03/19 1104 02/05/19 0517  WBC 18.5* 15.2* 11.1*  NEUTROABS 16.4* 12.6*  --   HGB 11.7* 10.5* 9.2*  HCT 37.2 32.9* 29.5*  MCV 93.5 92.9 93.1  PLT 445* 395 [id number]   Basic Metabolic Panel: Recent Labs  Lab 02/02/19 2049 02/03/19 1104  NA 141 140  K 4.4 3.8  CL 107 106  CO2 24 25  GLUCOSE 154* 115*  BUN 26* 21  CREATININE 0.98 0.89  CALCIUM 9.3 8.9   GFR: Estimated Creatinine Clearance: 34.8 mL/min (by C-G formula based on SCr of 0.89 mg/dL). Liver Function Tests: Recent Labs  Lab 02/02/19 2049  AST 23  ALT 10  ALKPHOS 91  BILITOT 0.9  PROT 6.9  ALBUMIN 2.7*   No results for input(s): LIPASE,  AMYLASE in the last 168 hours. No results for input(s): AMMONIA in the last 168 hours. Coagulation Profile: No results for input(s): INR, PROTIME in the last 168 hours. Cardiac Enzymes: Recent Labs  Lab 02/02/19 2049  CKTOTAL 134   BNP (last 3 results) No results for input(s): PROBNP in the last 8760 hours. HbA1C: No results for input(s): HGBA1C in the last 72 hours. CBG: Recent Labs  Lab 02/03/19 0158  GLUCAP 123*   Lipid Profile: No results for input(s): CHOL, HDL, LDLCALC, TRIG, CHOLHDL, LDLDIRECT in the last 72 hours. Thyroid Function Tests: No results for input(s): TSH, T4TOTAL, FREET4, T3FREE, THYROIDAB in the last 72 hours. Anemia Panel: No results for input(s): VITAMINB12, FOLATE, FERRITIN, TIBC, IRON, RETICCTPCT in the last 72 hours. Sepsis Labs: No results for input(s): PROCALCITON, LATICACIDVEN in the last 168 hours.  Recent Results (from the past 240 hour(s))  Urine culture     Status: Abnormal   Collection Time: 02/02/19 11:12 PM   Specimen: Urine, Random  Result Value Ref Range Status   Specimen Description URINE, RANDOM  Final   Special Requests   Final    NONE Performed at [facility], [address] [place], [place] [address]    Culture (A)  Final    >=100,000 COLONIES/mL PROTEUS MIRABILIS >=100,000 COLONIES/mL ESCHERICHIA COLI    Report Status 02/05/2019 FINAL  Final   Organism ID, Bacteria PROTEUS MIRABILIS (A)  Final   Organism ID, Bacteria ESCHERICHIA COLI (A)  Final      Susceptibility   Escherichia coli - MIC*    AMPICILLIN >=32 RESISTANT Resistant     CEFAZOLIN >=64 RESISTANT Resistant     CEFTRIAXONE 8 SENSITIVE Sensitive     CIPROFLOXACIN <=0.25 SENSITIVE Sensitive     GENTAMICIN <=1 SENSITIVE Sensitive     IMIPENEM <=0.25 SENSITIVE Sensitive     NITROFURANTOIN <=16 SENSITIVE Sensitive     TRIMETH/SULFA >=320 RESISTANT Resistant     AMPICILLIN/SULBACTAM >=32 RESISTANT Resistant     PIP/TAZO <=4 SENSITIVE Sensitive      Extended ESBL NEGATIVE Sensitive     * >=100,000 COLONIES/mL ESCHERICHIA COLI   Proteus mirabilis - MIC*    AMPICILLIN <=2 SENSITIVE Sensitive     CEFAZOLIN <=4 SENSITIVE Sensitive     CEFTRIAXONE <=1 SENSITIVE Sensitive     CIPROFLOXACIN <=0.25 SENSITIVE Sensitive     GENTAMICIN <=1 SENSITIVE Sensitive     IMIPENEM 2 SENSITIVE Sensitive     NITROFURANTOIN 128 RESISTANT Resistant     TRIMETH/SULFA <=20 SENSITIVE Sensitive     AMPICILLIN/SULBACTAM <=2 SENSITIVE Sensitive     PIP/TAZO <=4 SENSITIVE Sensitive     * >=  100,000 COLONIES/mL PROTEUS MIRABILIS  SARS CORONAVIRUS 2 (TAT 6-24 HRS) Nasopharyngeal Nasopharyngeal Swab     Status: None   Collection Time: 02/03/19 10:49 AM   Specimen: Nasopharyngeal Swab  Result Value Ref Range Status   SARS Coronavirus 2 NEGATIVE NEGATIVE Final    Comment: (NOTE) SARS-CoV-2 target nucleic acids are NOT DETECTED. The SARS-CoV-2 RNA is generally detectable in upper and lower respiratory specimens during the acute phase of infection. Negative results do not preclude SARS-CoV-2 infection, do not rule out co-infections with other pathogens, and should not be used as the sole basis for treatment or other patient management decisions. Negative results must be combined with clinical observations, patient history, and epidemiological information. The expected result is Negative. Fact Sheet for Patients: SugarRoll.be Fact Sheet for Healthcare Providers: https://www.woods-mathews.com/ This test is not yet approved or cleared by the [place] FDA and  has been authorized for detection and/or diagnosis of SARS-CoV-2 by FDA under an Emergency Use Authorization (EUA). This EUA will remain  in effect (meaning this test can be used) for the duration of the COVID-19 declaration under Section 56 4(b)(1) of the Act, 21 U.S.C. section 360bbb-3(b)(1), unless the authorization is terminated or revoked sooner. Performed  at [facility], [place] [address] [place], [place] [address]       Radiology Studies: No results found.   Scheduled Meds: . aspirin  81 mg Oral Daily  . cefdinir  300 mg Oral Q12H  . Chlorhexidine Gluconate Cloth  6 each Topical Daily  . docusate sodium  100 mg Oral BID  . enoxaparin (LOVENOX) injection  40 mg Subcutaneous Q24H  . gabapentin  100 mg Oral BID  . levothyroxine  50 mcg Oral Daily  . losartan  100 mg Oral Daily   Continuous Infusions:   LOS: 3 days    Time spent: 25 minutes spent in the coordination of care today.   Jonnie Finner, DO [facility] Pager [phone]  If 7PM-7AM, please contact night-coverage [email or web address] Password [email or web address] 02/06/2019, 4:01 PM

## 2019-02-07 ENCOUNTER — Other Ambulatory Visit: Payer: Self-pay | Admitting: *Deleted

## 2019-02-07 DIAGNOSIS — H3530 Unspecified macular degeneration: Secondary | ICD-10-CM | POA: Diagnosis not present

## 2019-02-07 DIAGNOSIS — M1990 Unspecified osteoarthritis, unspecified site: Secondary | ICD-10-CM | POA: Diagnosis not present

## 2019-02-07 DIAGNOSIS — L89154 Pressure ulcer of sacral region, stage 4: Secondary | ICD-10-CM | POA: Diagnosis not present

## 2019-02-07 DIAGNOSIS — E1140 Type 2 diabetes mellitus with diabetic neuropathy, unspecified: Secondary | ICD-10-CM | POA: Diagnosis not present

## 2019-02-07 DIAGNOSIS — F419 Anxiety disorder, unspecified: Secondary | ICD-10-CM | POA: Diagnosis not present

## 2019-02-07 DIAGNOSIS — L89620 Pressure ulcer of left heel, unstageable: Secondary | ICD-10-CM | POA: Diagnosis not present

## 2019-02-07 DIAGNOSIS — I10 Essential (primary) hypertension: Secondary | ICD-10-CM | POA: Diagnosis not present

## 2019-02-07 DIAGNOSIS — M48061 Spinal stenosis, lumbar region without neurogenic claudication: Secondary | ICD-10-CM | POA: Diagnosis not present

## 2019-02-07 LAB — CBC WITH DIFFERENTIAL/PLATELET
Abs Immature Granulocytes: 0.07 10*3/uL (ref 0.00–0.07)
Basophils Absolute: 0.1 10*3/uL (ref 0.0–0.1)
Basophils Relative: 1 %
Eosinophils Absolute: 0.7 10*3/uL — ABNORMAL HIGH (ref 0.0–0.5)
Eosinophils Relative: 7 %
HCT: 29.3 % — ABNORMAL LOW (ref 36.0–46.0)
Hemoglobin: 9.2 g/dL — ABNORMAL LOW (ref 12.0–15.0)
Immature Granulocytes: 1 %
Lymphocytes Relative: 15 %
Lymphs Abs: 1.5 10*3/uL (ref 0.7–4.0)
MCH: 28.9 pg (ref 26.0–34.0)
MCHC: 31.4 g/dL (ref 30.0–36.0)
MCV: 92.1 fL (ref 80.0–100.0)
Monocytes Absolute: 0.9 10*3/uL (ref 0.1–1.0)
Monocytes Relative: 9 %
Neutro Abs: 6.9 10*3/uL (ref 1.7–7.7)
Neutrophils Relative %: 67 %
Platelets: 351 10*3/uL (ref 150–400)
RBC: 3.18 MIL/uL — ABNORMAL LOW (ref 3.87–5.11)
RDW: 16 % — ABNORMAL HIGH (ref 11.5–15.5)
WBC: 10.2 10*3/uL (ref 4.0–10.5)
nRBC: 0 % (ref 0.0–0.2)

## 2019-02-07 LAB — RENAL FUNCTION PANEL
Albumin: 2 g/dL — ABNORMAL LOW (ref 3.5–5.0)
Anion gap: 7 (ref 5–15)
BUN: 13 mg/dL (ref 8–23)
CO2: 24 mmol/L (ref 22–32)
Calcium: 8.6 mg/dL — ABNORMAL LOW (ref 8.9–10.3)
Chloride: 107 mmol/L (ref 98–111)
Creatinine, Ser: 0.71 mg/dL (ref 0.44–1.00)
GFR calc Af Amer: >60 mL/min (ref >60)
GFR calc non Af Amer: >60 mL/min (ref >60)
Glucose, Bld: 95 mg/dL (ref 70–99)
Phosphorus: 3.1 mg/dL (ref 2.5–4.6)
Potassium: 3.7 mmol/L (ref 3.5–5.1)
Sodium: 138 mmol/L (ref 135–145)

## 2019-02-07 LAB — MAGNESIUM: Magnesium: 1.6 mg/dL — ABNORMAL LOW (ref 1.7–2.4)

## 2019-02-07 LAB — SARS CORONAVIRUS 2 (TAT 6-24 HRS): SARS Coronavirus 2: NEGATIVE

## 2019-02-07 MED ORDER — MAGNESIUM OXIDE 400 (241.3 MG) MG PO TABS
400.0000 mg | ORAL_TABLET | Freq: Every day | ORAL | Status: DC
  Administered 2019-02-07 – 2019-02-09 (×3): 400 mg via ORAL
  Filled 2019-02-07 (×3): qty 1

## 2019-02-07 NOTE — TOC Progression Note (Addendum)
Transition of Care [facility]) - Progression Note    Patient Details  Name: [name] MRN: [id number] Date of Birth: [birth year]  Transition of Care [facility]) CM/SW Contact  Bartholomew Crews, RN Phone Number: [phone] 02/07/2019, 10:33 AM  Clinical Narrative:    Call back to [facility] who had followed patient during last SNF stay at [facility]. It was thought that patient was to dc home with hospice on 01/28/2019. [facility] recommending that DC summary indicate palliative to follow up at SNF.   Spoke with [facility] who advised that patient was deemed ineligible for home hospice at time of evaluation. If patient returns to SNF, [facility] has a contract with [facility] but not [facility], so could follow at a [facility] location if desired.   Received call back from [facility]. [place] case was opened on 11/22, however, family was "dangerously" mean to staff, and [facility] cannot take her back. [facility] recommending that patient be placed at a facility.   Update: Advised by CSW that patient will be going to [facility] with granddaughter, [name], who is in agreement with SNF, and advised of [facility] to follow patient at [facility], [name] agrees. [facility] aware.   TOC team following for transition needs.    Expected Discharge Plan: Skilled Nursing Facility Barriers to Discharge: Continued Medical Work up  Expected Discharge Plan and Services Expected Discharge Plan: [place] In-house Referral: Clinical Social Work Discharge Planning Services: CM Consult                                           Social Determinants of Health (SDOH) Interventions    Readmission Risk Interventions No flowsheet data found.

## 2019-02-07 NOTE — Care Management Important Message (Signed)
Important Message  Patient Details  Name: [name] MRN: [id number] Date of Birth: [birth year]   Medicare Important Message Given:  Yes     Abbeygail Igoe [address] Circle 02/07/2019, 3:54 PM

## 2019-02-07 NOTE — Patient Outreach (Signed)
Notification received about [name]'s readmission to the hospital.  [name] recently discharged from [facility] on 01/28/19. The disposition plan was for her to return home with hospice services at that time. Prior to SNF discharge, facility reported [facility] was to be arranged. However, noted in Patient  [name] that member had [facility].   Spoke with inpatient RNCM to discuss above notes. Also requested a goals of care while inpatient and that palliative follow up be placed on the dc summary if member returns to SNF. Inpatient RNCM also confirms [name] has an open APS case with [facility].   Also left voicemail message for [facility] SNF discharge planner to request clarification about hospice services after most recent SNF dc.  Sent notification to [facility] hospital liaisons to make aware of writer's collaboration with inpatient RNCM.    Marthenia Rolling, MSN-Ed, RN,BSN [place] Acute Care Coordinator [phone] [facility]) [phone]  (Toll free office)

## 2019-02-07 NOTE — Plan of Care (Signed)
  Problem: Education: Goal: Knowledge of General Education information will improve Description: Including pain rating scale, medication(s)/side effects and non-pharmacologic comfort measures Outcome: Progressing   Problem: Clinical Measurements: Goal: Will remain free from infection Outcome: Progressing   Problem: Elimination: Goal: Will not experience complications related to urinary retention Outcome: Progressing   Problem: Safety: Goal: Ability to remain free from injury will improve Outcome: Progressing

## 2019-02-07 NOTE — Progress Notes (Signed)
Physical Therapy Treatment Patient Details Name: [name] MRN: [id number] DOB: Aug 26, 1926 Today's Date: 02/07/2019    History of Present Illness  85 year old female s/p R IM nailing following R closed hip flexor a fall in her home.  PMH includes L IM nail, osteopenia, CA and spinal stenosis.    PT Comments    Pt was seen for mobility to stand, sidestep and get into the chair for her lunch.  Pt is quite weak, and does not sequence her transfers without complete step by step help.  Follow up with her to manage her standing balance, strength of LE's and continue to recover from her R hip fracture and surgery in past months with residual weakness.  Pt is motivated with PT encouraging her.    Follow Up Recommendations  SNF     Equipment Recommendations  None recommended by PT    Recommendations for Other Services       Precautions / Restrictions Precautions Precautions: Fall Precaution Comments: has been falling since second dc to home Restrictions Weight Bearing Restrictions: No    Mobility  Bed Mobility Overal bed mobility: Needs Assistance Bed Mobility: Rolling;Supine to Sit Rolling: Mod assist Sidelying to sit: Mod assist Supine to sit: Mod assist     General bed mobility comments: rolled to side and assisted her to flex  hips, then push off sidelying  Transfers Overall transfer level: Needs assistance Equipment used: Rolling walker (2 wheeled);1 person hand held assist Transfers: Sit to/from Stand Sit to Stand: Mod assist Stand pivot transfers: Mod assist       General transfer comment: dense cues for hand placement but also struggling to wb on RLE to sidestep to the L  Ambulation/Gait                 Stairs             Wheelchair Mobility    Modified Rankin (Stroke Patients Only)       Balance Overall balance assessment: Needs assistance Sitting-balance support: Bilateral upper extremity supported;Feet supported Sitting  balance-Leahy Scale: Fair Sitting balance - Comments: guarded posture to avoid impulsive attempts to stand   Standing balance support: Bilateral upper extremity supported;During functional activity Standing balance-Leahy Scale: Poor Standing balance comment: better upright standing on walker today                            Cognition Arousal/Alertness: Awake/alert Behavior During Therapy: WFL for tasks assessed/performed Overall Cognitive Status: No family/caregiver present to determine baseline cognitive functioning                                 General Comments: cognitive changes but is following instructions      Exercises      General Comments General comments (skin integrity, edema, etc.): set pt up with lunch and reported to nursing      Pertinent Vitals/Pain Pain Assessment: Faces Faces Pain Scale: Hurts little more Pain Location: sacral skin Pain Intervention(s): Monitored during session;Repositioned    Home Living                      Prior Function            PT Goals (current goals can now be found in the care plan section) Acute Rehab PT Goals Patient Stated Goal: to get cleaned up Progress  towards PT goals: Progressing toward goals    Frequency    Min 2X/week      PT Plan Current plan remains appropriate    Co-evaluation              AM-PAC PT "6 Clicks" Mobility   Outcome Measure  Help needed turning from your back to your side while in a flat bed without using bedrails?: A Lot Help needed moving from lying on your back to sitting on the side of a flat bed without using bedrails?: A Lot Help needed moving to and from a bed to a chair (including a wheelchair)?: A Lot Help needed standing up from a chair using your arms (e.g., wheelchair or bedside chair)?: Total Help needed to walk in hospital room?: Total Help needed climbing 3-5 steps with a railing? : Total 6 Click Score: 9    End of Session  Equipment Utilized During Treatment: Gait belt Activity Tolerance: Patient limited by fatigue;Treatment limited secondary to medical complications (Comment) Patient left: in chair;with call bell/phone within reach;with chair alarm set Nurse Communication: Mobility status PT Visit Diagnosis: Muscle weakness (generalized) (M62.81);Other abnormalities of gait and mobility (R26.89);Unsteadiness on feet (R26.81);Repeated falls (R29.6);Ataxic gait (R26.0);Difficulty in walking, not elsewhere classified (R26.2);Adult, failure to thrive (R62.7);Pain Pain - Right/Left: (sacrum) Pain - part of body: (sacrum)     Time: [id number] PT Time Calculation (min) (ACUTE ONLY): 20 min  Charges:  $Therapeutic Activity: 8-22 mins                    Ramond Dial 02/07/2019, 4:30 PM   Mee Hives, PT MS Acute Rehab Dept. Number: [place] [facility] [place]

## 2019-02-07 NOTE — Progress Notes (Addendum)
[name]  PROGRESS NOTE    [name]  [id number] DOB: 22-Dec-1926 DOA: 02/02/2019 PCP: McLean-Scocuzza, Nino Glow, MD   Brief Narrative:   [name] an 85 y.o.femalewith medical history significant for hypothyroidism, hypertension, hyperlipidemia, diabetes mellitus, multiple falls, recent bilateral hip fractures requiring surgery, stage IV sacral decubitus ulcer, urinary retention requiring Foley catheter, anemia, dementia, breast and uterine cancer. She was recently discharged from [facility] on 01/28/2019 but apparently family has had a difficult time taking care of the patient. She was brought to the hospital after she was found on the floor at home.  11/29: Pleasant this AM. Denies complaints. Says she ate well.  11/30: No acute events ON. Working on discharge options. COVID ordered.   Assessment & Plan:   Principal Problem:   UTI (urinary tract infection) due to urinary indwelling Foley catheter [facility]) Active Problems:   Essential hypertension   Hypothyroidism   Falls frequently   Hospital admission due to social situation   Stage IV pressure ulcer of sacral region [facility])   Dementia with behavioral disturbance ([place])  Acute UTI in a patient with indwelling Foley catheter     - UCx: E. coli and Proteus mirabilis sensative to cetriaxone, cipro.     - IV Rocephin was transitioned to [facility] based on sensitivity reports.     - she is stable, monitor     - 11/30: Denies complaints this AM; omnicef to end tomorrow.  Hypertension     - continue losartan; BP is acceptable  Hypothyroidism      - continue synthroid  Hypomagnesemia     - add MagOX  Frequent falls with recent right hip fracture in October, s/p ORIF     - PT/OT: SNF rehab  Stage IV sacral decubitus ulcer (POA).       - Continue local wound care.       - Moist gauze packing to promote healing, foam dressing to protect from further injury.     - turns as prescribed  Poor social  situation     - Apparently, patient was found by her neighbor lying on the floor at home.       - open APS case     - CM/SW onboard; appreciate assistance     - SNF bed secured; COVID ordered, awaiting results  DVT prophylaxis: lovenox Code Status: DNR   Disposition Plan: TBD  Antimicrobials:  . omnicef   Subjective: "I'm worried about this little thing."  Objective: Vitals:   02/06/19 1631 02/06/19 2224 02/07/19 0613 02/07/19 0848  BP: (!) 148/80 135/65 138/62 134/69  Pulse: 76 67 70 75  Resp: 18 16 16 17   Temp: 98.8 F (37.1 C) 98.8 F (37.1 C) 98.6 F (37 C) 98 F (36.7 C)  TempSrc: Oral Oral Oral Oral  SpO2: 100% 96% 98% 100%  Weight:      Height:        Intake/Output Summary (Last 24 hours) at 02/07/2019 1122 Last data filed at 02/07/2019 1000 Gross per 24 hour  Intake 510 ml  Output 900 ml  Net -390 ml   Filed Weights   02/03/19 0007 02/03/19 2109 02/04/19 2032  Weight: 61.2 kg 61 kg 61 kg    Examination:  General: 85 y.o. female resting in bed in NAD Cardiovascular: RRR, +S1, S2, no m/g/r Respiratory: CTABL, no w/r/r GI: BS+, NDNT, soft MSK: No e/c/c Neuro: Alert to name, follows commands Psyc: calm/cooperative   Data Reviewed: I have  personally reviewed following labs and imaging studies.  CBC: Recent Labs  Lab 02/02/19 2049 02/03/19 1104 02/05/19 0517 02/07/19 0415  WBC 18.5* 15.2* 11.1* 10.2  NEUTROABS 16.4* 12.6*  --  6.9  HGB 11.7* 10.5* 9.2* 9.2*  HCT 37.2 32.9* 29.5* 29.3*  MCV 93.5 92.9 93.1 92.1  PLT 445* 395 350 [id number]   Basic Metabolic Panel: Recent Labs  Lab 02/02/19 2049 02/03/19 1104 02/07/19 0415  NA 141 140 138  K 4.4 3.8 3.7  CL 107 106 107  CO2 24 25 24   GLUCOSE 154* 115* 95  BUN 26* 21 13  CREATININE 0.98 0.89 0.71  CALCIUM 9.3 8.9 8.6*  MG  --   --  1.6*  PHOS  --   --  3.1   GFR: Estimated Creatinine Clearance: 38.7 mL/min (by C-G formula based on SCr of 0.71 mg/dL). Liver Function Tests: Recent Labs   Lab 02/02/19 2049 02/07/19 0415  AST 23  --   ALT 10  --   ALKPHOS 91  --   BILITOT 0.9  --   PROT 6.9  --   ALBUMIN 2.7* 2.0*   No results for input(s): LIPASE, AMYLASE in the last 168 hours. No results for input(s): AMMONIA in the last 168 hours. Coagulation Profile: No results for input(s): INR, PROTIME in the last 168 hours. Cardiac Enzymes: Recent Labs  Lab 02/02/19 2049  CKTOTAL 134   BNP (last 3 results) No results for input(s): PROBNP in the last 8760 hours. HbA1C: No results for input(s): HGBA1C in the last 72 hours. CBG: Recent Labs  Lab 02/03/19 0158  GLUCAP 123*   Lipid Profile: No results for input(s): CHOL, HDL, LDLCALC, TRIG, CHOLHDL, LDLDIRECT in the last 72 hours. Thyroid Function Tests: No results for input(s): TSH, T4TOTAL, FREET4, T3FREE, THYROIDAB in the last 72 hours. Anemia Panel: No results for input(s): VITAMINB12, FOLATE, FERRITIN, TIBC, IRON, RETICCTPCT in the last 72 hours. Sepsis Labs: No results for input(s): PROCALCITON, LATICACIDVEN in the last 168 hours.  Recent Results (from the past 240 hour(s))  Urine culture     Status: Abnormal   Collection Time: 02/02/19 11:12 PM   Specimen: Urine, Random  Result Value Ref Range Status   Specimen Description URINE, RANDOM  Final   Special Requests   Final    NONE Performed at [facility], [address] [place], [place] [address]    Culture (A)  Final    >=100,000 COLONIES/mL PROTEUS MIRABILIS >=100,000 COLONIES/mL ESCHERICHIA COLI    Report Status 02/05/2019 FINAL  Final   Organism ID, Bacteria PROTEUS MIRABILIS (A)  Final   Organism ID, Bacteria ESCHERICHIA COLI (A)  Final      Susceptibility   Escherichia coli - MIC*    AMPICILLIN >=32 RESISTANT Resistant     CEFAZOLIN >=64 RESISTANT Resistant     CEFTRIAXONE 8 SENSITIVE Sensitive     CIPROFLOXACIN <=0.25 SENSITIVE Sensitive     GENTAMICIN <=1 SENSITIVE Sensitive     IMIPENEM <=0.25 SENSITIVE Sensitive      NITROFURANTOIN <=16 SENSITIVE Sensitive     TRIMETH/SULFA >=320 RESISTANT Resistant     AMPICILLIN/SULBACTAM >=32 RESISTANT Resistant     PIP/TAZO <=4 SENSITIVE Sensitive     Extended ESBL NEGATIVE Sensitive     * >=100,000 COLONIES/mL ESCHERICHIA COLI   Proteus mirabilis - MIC*    AMPICILLIN <=2 SENSITIVE Sensitive     CEFAZOLIN <=4 SENSITIVE Sensitive     CEFTRIAXONE <=1 SENSITIVE Sensitive  CIPROFLOXACIN <=0.25 SENSITIVE Sensitive     GENTAMICIN <=1 SENSITIVE Sensitive     IMIPENEM 2 SENSITIVE Sensitive     NITROFURANTOIN 128 RESISTANT Resistant     TRIMETH/SULFA <=20 SENSITIVE Sensitive     AMPICILLIN/SULBACTAM <=2 SENSITIVE Sensitive     PIP/TAZO <=4 SENSITIVE Sensitive     * >=100,000 COLONIES/mL PROTEUS MIRABILIS  SARS CORONAVIRUS 2 (TAT 6-24 HRS) Nasopharyngeal Nasopharyngeal Swab     Status: None   Collection Time: 02/03/19 10:49 AM   Specimen: Nasopharyngeal Swab  Result Value Ref Range Status   SARS Coronavirus 2 NEGATIVE NEGATIVE Final    Comment: (NOTE) SARS-CoV-2 target nucleic acids are NOT DETECTED. The SARS-CoV-2 RNA is generally detectable in upper and lower respiratory specimens during the acute phase of infection. Negative results do not preclude SARS-CoV-2 infection, do not rule out co-infections with other pathogens, and should not be used as the sole basis for treatment or other patient management decisions. Negative results must be combined with clinical observations, patient history, and epidemiological information. The expected result is Negative. Fact Sheet for Patients: SugarRoll.be Fact Sheet for Healthcare Providers: https://www.woods-mathews.com/ This test is not yet approved or cleared by the [place] FDA and  has been authorized for detection and/or diagnosis of SARS-CoV-2 by FDA under an Emergency Use Authorization (EUA). This EUA will remain  in effect (meaning this test can be used) for the  duration of the COVID-19 declaration under Section 56 4(b)(1) of the Act, 21 U.S.C. section 360bbb-3(b)(1), unless the authorization is terminated or revoked sooner. Performed at [facility], [place] [address], [place], [place] [address]       Radiology Studies: No results found.   Scheduled Meds: . aspirin  81 mg Oral Daily  . cefdinir  300 mg Oral Q12H  . Chlorhexidine Gluconate Cloth  6 each Topical Daily  . docusate sodium  100 mg Oral BID  . enoxaparin (LOVENOX) injection  40 mg Subcutaneous Q24H  . gabapentin  100 mg Oral BID  . levothyroxine  50 mcg Oral Daily  . losartan  100 mg Oral Daily  . magnesium oxide  400 mg Oral Daily   Continuous Infusions:   LOS: 4 days    Time spent: 25 minutes spent in the coordination of care today.    Jonnie Finner, DO [facility] Pager [phone]  If 7PM-7AM, please contact night-coverage [email or web address] Password [email or web address] 02/07/2019, 11:22 AM

## 2019-02-07 NOTE — TOC Progression Note (Addendum)
Transition of Care [facility]) - Progression Note  *SNF chosen - [facility]   Patient Details  Name: [name] MRN: [id number] Date of Birth: [birth year]  [facility] of [facility]) CM/SW Contact  Sharlet Salina Mila Homer, LCSW Phone Number: 02/07/2019, 1:02 PM  Clinical Narrative:  Reed Breech [phone]) contacted regarding facility responses and chose [facility]. Spoke with Tammy, admissions director regarding patient and they can take her when medically stable. MD contacted via text page, updated him on facility chosen and COVID test requested.  [place] MUST contacted and patient does have a PASRR number (found on prior FL-2 - [id number]), but could not pull-up patient in MUST system. Called [place] MUST and learned from Norman that DOB was entered incorrectly (DOB last 4 digits of ss# entered transposed). This was corrected in [facility] MUST.   Expected Discharge Plan: Skilled Nursing Facility Barriers to Discharge: Continued Medical Work up  Expected Discharge Plan and Services Expected Discharge Plan: [place] In-house Referral: Clinical Social Work Discharge Planning Services: CM Consult                                         Social Determinants of Health (SDOH) Interventions  No SDOH interventions needed at this time.   Readmission Risk Interventions No flowsheet data found.

## 2019-02-08 LAB — CBC WITH DIFFERENTIAL/PLATELET
Abs Immature Granulocytes: 0.06 10*3/uL (ref 0.00–0.07)
Basophils Absolute: 0.1 10*3/uL (ref 0.0–0.1)
Basophils Relative: 1 %
Eosinophils Absolute: 0.7 10*3/uL — ABNORMAL HIGH (ref 0.0–0.5)
Eosinophils Relative: 7 %
HCT: 33.6 % — ABNORMAL LOW (ref 36.0–46.0)
Hemoglobin: 10.4 g/dL — ABNORMAL LOW (ref 12.0–15.0)
Immature Granulocytes: 1 %
Lymphocytes Relative: 18 %
Lymphs Abs: 1.9 10*3/uL (ref 0.7–4.0)
MCH: 28.9 pg (ref 26.0–34.0)
MCHC: 31 g/dL (ref 30.0–36.0)
MCV: 93.3 fL (ref 80.0–100.0)
Monocytes Absolute: 1 10*3/uL (ref 0.1–1.0)
Monocytes Relative: 9 %
Neutro Abs: 7 10*3/uL (ref 1.7–7.7)
Neutrophils Relative %: 64 %
Platelets: 419 10*3/uL — ABNORMAL HIGH (ref 150–400)
RBC: 3.6 MIL/uL — ABNORMAL LOW (ref 3.87–5.11)
RDW: 16.1 % — ABNORMAL HIGH (ref 11.5–15.5)
WBC: 10.7 10*3/uL — ABNORMAL HIGH (ref 4.0–10.5)
nRBC: 0 % (ref 0.0–0.2)

## 2019-02-08 LAB — RENAL FUNCTION PANEL
Albumin: 2.2 g/dL — ABNORMAL LOW (ref 3.5–5.0)
Anion gap: 9 (ref 5–15)
BUN: 9 mg/dL (ref 8–23)
CO2: 23 mmol/L (ref 22–32)
Calcium: 9.1 mg/dL (ref 8.9–10.3)
Chloride: 107 mmol/L (ref 98–111)
Creatinine, Ser: 0.71 mg/dL (ref 0.44–1.00)
GFR calc Af Amer: >60 mL/min (ref >60)
GFR calc non Af Amer: >60 mL/min (ref >60)
Glucose, Bld: 100 mg/dL — ABNORMAL HIGH (ref 70–99)
Phosphorus: 2.9 mg/dL (ref 2.5–4.6)
Potassium: 4 mmol/L (ref 3.5–5.1)
Sodium: 139 mmol/L (ref 135–145)

## 2019-02-08 LAB — MAGNESIUM: Magnesium: 1.7 mg/dL (ref 1.7–2.4)

## 2019-02-08 MED ORDER — HYDRALAZINE HCL 20 MG/ML IJ SOLN: 10.0000 mg | Freq: Three times a day (TID) | INTRAMUSCULAR | Status: DC | PRN
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
## 2019-02-08 NOTE — Progress Notes (Deleted)
02/09/2019 4:17 PM   [name] Feb 18, 1927 [id number]  Referring provider: McLean-Scocuzza, Nino Glow, MD [place],  [place] [address]  No chief complaint on file.   HPI: [name] is a 85 year old female with urinary retention who presents today to assess her readiness for a voiding trial.  She was seen by Dr. Erlene Quan on 01/04/2019 and was found to have a PVR of 500cc and a stage IV decubitus ulcer.  It was decided at that visit to go ahead and place a Foley catheter for bladder decompression and also to keep urine away from the decubitus ulcer to facilitate healing.   PMH: Past Medical History:  Diagnosis Date  . Anxiety 12/2002  . Arthritis    hands  . Cancer ([place])    breast left arm  . Cancer ([place])    uterine  . Diabetes mellitus 11/2004   Type 2  . Dyspnea    WITH EXERTION  . GERD (gastroesophageal reflux disease)   . Hyperlipidemia 11/2004  . Hypertension 12/2002  . Osteopenia 02/2002   02/2002 Dexa osteopenia// Dexa 02/26/2004 (Dr. Ubaldo Glassing) stable/IMR femur/spine  . Spinal stenosis   . 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 Ronnald Collum endocrinologist  . Vertigo     Surgical History: Past Surgical History:  Procedure Laterality Date  . ABDOMINAL HYSTERECTOMY  1990   endometrial adenoca  . BREAST SURGERY  1980   left breast lumpectomy //abscess of breast evac.  Marland Kitchen CATARACT EXTRACTION W/PHACO Left 12/11/2016   Procedure: CATARACT EXTRACTION PHACO AND INTRAOCULAR LENS PLACEMENT (IOC);  Surgeon: Eulogio Bear, MD;  Location: [facility] ORS;  Service: Ophthalmology;  Laterality: Left;  [place] 1:18.4 AP 36.6% CDE 16.13  . CATARACT EXTRACTION W/PHACO Right 01/01/2017   Procedure: CATARACT EXTRACTION PHACO AND INTRAOCULAR LENS PLACEMENT (IOC);  Surgeon: Eulogio Bear, MD;  Location: [facility] ORS;  Service: Ophthalmology;  Laterality: Right;  [place]: 01:45.5 AP% 16.6 CDE 18.21 Fluid pack lot # [id number]  . CHOLECYSTECTOMY   07/10/1987  . EYE SURGERY     Tear Duct surgery, b/l cataract 2019   . FEMUR IM NAIL Right 12/13/2018   Procedure: INTRAMEDULLARY (IM) NAIL FEMORAL, RIGHT;  Surgeon: Corky Mull, MD;  Location: [facility] ORS;  Service: Orthopedics;  Laterality: Right;  . INTRAMEDULLARY (IM) NAIL INTERTROCHANTERIC Left 10/28/2018   Procedure: INTRAMEDULLARY (IM) NAIL INTERTROCHANTRIC LEFT LONG;  Surgeon: Hessie Knows, MD;  Location: [facility] ORS;  Service: Orthopedics;  Laterality: Left;  Marland Kitchen MASTECTOMY Left     Home Medications:  Allergies as of 02/09/2019      Reactions   Amlodipine Other (See Comments)   Leg edema    Atenolol Other (See Comments)   REACTION: bradycardia   Erythromycin Base Other (See Comments)   REACTION: stomach upset   Lisinopril Other (See Comments)   REACTION: cough  (20 mg)      Medication List    Notice   This visit is during an admission. Changes to the med list made in this visit will be reflected in the After Visit Summary of the admission.     Allergies:  Allergies  Allergen Reactions  . Amlodipine Other (See Comments)    Leg edema   . Atenolol Other (See Comments)    REACTION: bradycardia  . Erythromycin Base Other (See Comments)    REACTION: stomach upset  . Lisinopril Other (See Comments)    REACTION: cough  (20 mg)    Family  History: Family History  Problem Relation Age of Onset  . Alzheimer's disease Mother   . Heart disease Mother 16       angina  . Hypertension Mother   . Crohn's disease Mother        ? crohns disease  . Cancer Father 68       colon cancer  . Cancer Sister 53       breast cancer   . COPD Sister        emphysema (smoker)  . Heart disease Sister        angina  . Alcohol abuse Brother   . Diabetes Paternal Aunt   . Hypertension Sister   . Stroke Neg Hx     Social History:  reports that she has never smoked. She has never used smokeless tobacco. She reports that she does not drink alcohol or use drugs.  ROS:                                         Physical Exam: There were no vitals taken for this visit.  Constitutional:  Well nourished. Alert and oriented, No acute distress. HEENT: [place] AT, moist mucus membranes.  Trachea midline, no masses. Cardiovascular: No clubbing, cyanosis, or edema. Respiratory: Normal respiratory effort, no increased work of breathing. GI: Abdomen is soft, non tender, non distended, no abdominal masses. Liver and spleen not palpable.  No hernias appreciated.  Stool sample for occult testing is not indicated.   GU: No CVA tenderness.  No bladder fullness or masses.  *** external genitalia, *** pubic hair distribution, no lesions.  Normal urethral meatus, no lesions, no prolapse, no discharge.   No urethral masses, tenderness and/or tenderness. No bladder fullness, tenderness or masses. *** vagina mucosa, *** estrogen effect, no discharge, no lesions, *** pelvic support, *** cystocele and *** rectocele noted.  No cervical motion tenderness.  Uterus is freely mobile and non-fixed.  No adnexal/parametria masses or tenderness noted.  Anus and perineum are without rashes or lesions.   ***  Skin: No rashes, bruises or suspicious lesions. Lymph: No cervical or inguinal adenopathy. Neurologic: Grossly intact, no focal deficits, moving all 4 extremities. Psychiatric: Normal mood and affect.   Laboratory Data: Lab Results  Component Value Date   WBC 10.7 (H) 02/08/2019   HGB 10.4 (L) 02/08/2019   HCT 33.6 (L) 02/08/2019   MCV 93.3 02/08/2019   PLT 419 (H) 02/08/2019    Lab Results  Component Value Date   CREATININE 0.71 02/08/2019    No results found for: PSA  No results found for: TESTOSTERONE  Lab Results  Component Value Date   HGBA1C 5.7 (H) 10/27/2018    Lab Results  Component Value Date   TSH 1.16 10/07/2018       Component Value Date/Time   CHOL 162 10/07/2018 0947   HDL 56.30 10/07/2018 0947   CHOLHDL 3 10/07/2018 0947   VLDL 14.6 10/07/2018 0947    LDLCALC 91 10/07/2018 0947   LDLCALC 95 03/20/2017 1749    Lab Results  Component Value Date   AST 23 02/02/2019   Lab Results  Component Value Date   ALT 10 02/02/2019   No components found for: ALKALINEPHOPHATASE No components found for: BILIRUBINTOTAL  No results found for: ESTRADIOL  Urinalysis    Component Value Date/Time   COLORURINE AMBER (A) 02/02/2019 2049   APPEARANCEUR CLOUDY (  A) 02/02/2019 2049   APPEARANCEUR Clear 02/04/2014 1652   LABSPEC 1.020 02/02/2019 2049   LABSPEC 1.014 02/04/2014 1652   LABSPEC 1.010 08/23/2008 1504   PHURINE 5.0 02/02/2019 2049   GLUCOSEU NEGATIVE 02/02/2019 2049   GLUCOSEU Negative 02/04/2014 1652   HGBUR NEGATIVE 02/02/2019 2049   BILIRUBINUR NEGATIVE 02/02/2019 2049   BILIRUBINUR Negative 02/04/2014 1652   KETONESUR 5 (A) 02/02/2019 2049   PROTEINUR 100 (A) 02/02/2019 2049   UROBILINOGEN 0.2 12/26/2011 1229   UROBILINOGEN 1.0 10/10/2011 0034   NITRITE POSITIVE (A) 02/02/2019 2049   LEUKOCYTESUR MODERATE (A) 02/02/2019 2049   LEUKOCYTESUR Trace 02/04/2014 1652    I have reviewed the labs.   Pertinent Imaging: *** I have independently reviewed the films.    Assessment & Plan:  ***  Acute urinary retention:     - foley catheter removed  -voiding trial today    -return if unable to urinate or experiencing suprapubic discomfort  -follow-up in one month for I PSS score, PVR and exam.   No follow-ups on file.  These notes generated with voice recognition software. I apologize for typographical errors.  Zara Council, PA-C  [facility] [address]  [place], [place] [address] [phone]

## 2019-02-08 NOTE — Plan of Care (Signed)
  Problem: Pain Managment: Goal: General experience of comfort will improve Outcome: Progressing   

## 2019-02-08 NOTE — Progress Notes (Signed)
[name]  PROGRESS NOTE    [name]  [id number] DOB: 29-Oct-1926 DOA: 02/02/2019 PCP: [name], Stacy Glow, MD   Brief Narrative:   [name] an 85 yEstradaoEstradafemalewith medical history significant for hypothyroidism, hypertension, hyperlipidemia, diabetes mellitus, multiple falls, recent bilateral hip fractures requiring surgery, stage IV sacral decubitus ulcer, urinary retention requiring Foley catheter, anemia, dementia, breast and uterine cancer. She was recently discharged from [facility] on 01/28/2019 but apparently family has had a difficult time taking care of the patient. She was brought to the hospital after she was found on the floor at home.  11/29: Pleasant this AM. Denies complaints. Says she ate well. 11/30: No acute events ON. Working on discharge options. COVID ordered. 12/1: Per reports, family doesn't want patient going to SNF. See CM note. APS case pending.    Assessment & Plan:   Principal Problem:   UTI (urinary tract infection) due to urinary indwelling Foley catheter [facility]) Active Problems:   Essential hypertension   Hypothyroidism   Falls frequently   Hospital admission due to social situation   Stage IV pressure ulcer of sacral region [facility])   Dementia with behavioral disturbance ([place])   Acute UTI in a patient with indwelling Foley catheter - UCx: E. coli and Proteus mirabilis sensative to cetriaxone, cipro. - IV Rocephin was transitioned to [facility] based on sensitivity reports. - she is stable, monitor     - omnicef ends today; monitor  Hypertension - continue losartan     - BP up; add PRN hydralazine  Hypothyroidism  - continue synthroid  Hypomagnesemia     - Mg2+ better today; continue MagOx for now  Frequent falls with recent right hip fracture in October, s/p ORIF - PT/OT: SNF rehab     - c/o hip pain, no new falls since admission; admission imaging negative for acute change; give  tramadol w/ APAP  Stage IV sacral decubitus ulcer (POA).  - Continue local wound care.  - Moist gauze packing to promote healing, foam dressing to protect from further injury. - turns as prescribed     - c/o hip pain, no new falls since admission; admission imaging negative for acute change; give tramadol w/ APAP  Poor social situation - Apparently, patient was found by her neighbor lying on the floor at home.  - open APS case - CM/SW onboard; appreciate assistance     - SNF bed secured; COVID negative; APS case out and family doesn't want patient going to SNF per CM report; waiting on final resolution  DVT prophylaxis: lovenox Code Status: DNR Disposition Plan: TBD  Antimicrobials:  . Omnicef   Subjective: "It hurts in my bottom."  Objective: Vitals:   02/07/19 2037 02/08/19 0457 02/08/19 0810 02/08/19 1618  BP: (!) 172/78 (!) 148/65 (!) 125/59 (!) 179/78  Pulse: 74 66 72 72  Resp: 16 15 16 16   Temp: 98[name]2 F (36[name]8 C) 98[name]1 F (36[name]7 C) 98[name]1 F (36[name]7 C) 98[name]6 F (37 C)  TempSrc:  Oral Oral Oral  SpO2: 100% 95% 99% 99%  Weight:      Height:        Intake/Output Summary (Last 24 hours) at 02/08/2019 1703 Last data filed at 02/08/2019 1600 Gross per 24 hour  Intake 520 ml  Output 1600 ml  Net -1080 ml   Filed Weights   02/03/19 0007 02/03/19 2109 02/04/19 2032  Weight: 61[name]2 kg 61 kg 61 kg    Examination:  General: 85 yEstradao. female resting  in bed in NAD Cardiovascular: RRR, +S1, S2, no m/g/r, equal pulses throughout Respiratory: CTABL, no w/r/r, normal WOB GI: BS+, NDNT, no masses noted, no organomegaly noted MSK: No e/c/c Neuro: alert to name, follows commands Psyc: Appropriate interaction and affect, calm/cooperative   Data Reviewed: I have personally reviewed following labs and imaging studies.  CBC: Recent Labs  Lab 02/02/19 2049 02/03/19 1104 02/05/19 0517 02/07/19 0415 02/08/19 0525  WBC 18[name]5* 15[name]2* 11[name]1* 10[name]2 10[name]7*   NEUTROABS 16[name]4* 12[name]6*  --  6[name]9 7[name]0  HGB 11[name]7* 10[name]5* 9[name]2* 9[name]2* 10[name]4*  HCT 37[name]2 32[name]9* 29[name]5* 29[name]3* 33[name]6*  MCV 93[name]5 92[name]9 93[name]1 92[name]1 93[name]3  PLT 445* 395 350 351 123XX123*   Basic Metabolic Panel: Recent Labs  Lab 02/02/19 2049 02/03/19 1104 02/07/19 0415 02/08/19 0525  NA 141 140 138 139  K 4[name]4 3[name]8 3[name]7 4[name]0  CL 107 106 107 107  CO2 24 25 24 23   GLUCOSE 154* 115* 95 100*  BUN 26* 21 13 9   CREATININE 0[name]98 0[name]89 0[name]71 0[name]71  CALCIUM 9[name]3 8[name]9 8[name]6* 9[name]1  MG  --   --  1[name]6* 1[name]7  PHOS  --   --  3[name]1 2[name]9   GFR: Estimated Creatinine Clearance: 38[name]7 mL/min (by C-G formula based on SCr of 0[name]71 mg/dL). Liver Function Tests: Recent Labs  Lab 02/02/19 2049 02/07/19 0415 02/08/19 0525  AST 23  --   --   ALT 10  --   --   ALKPHOS 91  --   --   BILITOT 0[name]9  --   --   PROT 6[name]9  --   --   ALBUMIN 2[name]7* 2[name]0* 2[name]2*   No results for input(s): LIPASE, AMYLASE in the last 168 hours. No results for input(s): AMMONIA in the last 168 hours. Coagulation Profile: No results for input(s): INR, PROTIME in the last 168 hours. Cardiac Enzymes: Recent Labs  Lab 02/02/19 2049  CKTOTAL 134   BNP (last 3 results) No results for input(s): PROBNP in the last 8760 hours. HbA1C: No results for input(s): HGBA1C in the last 72 hours. CBG: Recent Labs  Lab 02/03/19 0158  GLUCAP 123*   Lipid Profile: No results for input(s): CHOL, HDL, LDLCALC, TRIG, CHOLHDL, LDLDIRECT in the last 72 hours. Thyroid Function Tests: No results for input(s): TSH, T4TOTAL, FREET4, T3FREE, THYROIDAB in the last 72 hours. Anemia Panel: No results for input(s): VITAMINB12, FOLATE, FERRITIN, TIBC, IRON, RETICCTPCT in the last 72 hours. Sepsis Labs: No results for input(s): PROCALCITON, LATICACIDVEN in the last 168 hours.  Recent Results (from the past 240 hour(s))  Urine culture     Status: Abnormal   Collection Time: 02/02/19 11:12 PM   Specimen: Urine, Random  Result Value Ref Range Status   Specimen Description URINE, RANDOM   Final   Special Requests   Final    NONE Performed at [facility], [address], [place], [place] [address]    Culture (A)  Final    >=100,000 COLONIES/mL PROTEUS MIRABILIS >=100,000 COLONIES/mL ESCHERICHIA COLI    Report Status 02/05/2019 FINAL  Final   Organism ID, Bacteria PROTEUS MIRABILIS (A)  Final   Organism ID, Bacteria ESCHERICHIA COLI (A)  Final      Susceptibility   Escherichia coli - MIC*    AMPICILLIN >=32 RESISTANT Resistant     CEFAZOLIN >=64 RESISTANT Resistant     CEFTRIAXONE 8 SENSITIVE Sensitive     CIPROFLOXACIN <=0[name]25 SENSITIVE Sensitive     GENTAMICIN <=1 SENSITIVE Sensitive  IMIPENEM <=0[name]25 SENSITIVE Sensitive     NITROFURANTOIN <=16 SENSITIVE Sensitive     TRIMETH/SULFA >=320 RESISTANT Resistant     AMPICILLIN/SULBACTAM >=32 RESISTANT Resistant     PIP/TAZO <=4 SENSITIVE Sensitive     Extended ESBL NEGATIVE Sensitive     * >=100,000 COLONIES/mL ESCHERICHIA COLI   Proteus mirabilis - MIC*    AMPICILLIN <=2 SENSITIVE Sensitive     CEFAZOLIN <=4 SENSITIVE Sensitive     CEFTRIAXONE <=1 SENSITIVE Sensitive     CIPROFLOXACIN <=0[name]25 SENSITIVE Sensitive     GENTAMICIN <=1 SENSITIVE Sensitive     IMIPENEM 2 SENSITIVE Sensitive     NITROFURANTOIN 128 RESISTANT Resistant     TRIMETH/SULFA <=20 SENSITIVE Sensitive     AMPICILLIN/SULBACTAM <=2 SENSITIVE Sensitive     PIP/TAZO <=4 SENSITIVE Sensitive     * >=100,000 COLONIES/mL PROTEUS MIRABILIS  SARS CORONAVIRUS 2 (TAT 6-24 HRS) Nasopharyngeal Nasopharyngeal Swab     Status: None   Collection Time: 02/03/19 10:49 AM   Specimen: Nasopharyngeal Swab  Result Value Ref Range Status   SARS Coronavirus 2 NEGATIVE NEGATIVE Final    Comment: (NOTE) SARS-CoV-2 target nucleic acids are NOT DETECTED. The SARS-CoV-2 RNA is generally detectable in upper and lower respiratory specimens during the acute phase of infection. Negative results do not preclude SARS-CoV-2 infection, do not rule out co-infections with  other pathogens, and should not be used as the sole basis for treatment or other patient management decisions. Negative results must be combined with clinical observations, patient history, and epidemiological information. The expected result is Negative. Fact Sheet for Patients: SugarRollEstradabe Fact Sheet for Healthcare Providers: https://wwwEstradawoods-mathewsEstradacom/ This test is not yet approved or cleared by the [place] FDA and  has been authorized for detection and/or diagnosis of SARS-CoV-2 by FDA under an Emergency Use Authorization (EUA). This EUA will remain  in effect (meaning this test can be used) for the duration of the COVID-19 declaration under Section 56 4(b)(1) of the Act, 21 UEstradaSEstradaC. section 360bbb-3(b)(1), unless the authorization is terminated or revoked sooner. Performed at [facility], [place] [address] [place], [place] [address]   SARS CORONAVIRUS 2 (TAT 6-24 HRS) Nasopharyngeal Nasopharyngeal Swab     Status: None   Collection Time: 02/07/19  1:01 PM   Specimen: Nasopharyngeal Swab  Result Value Ref Range Status   SARS Coronavirus 2 NEGATIVE NEGATIVE Final    Comment: (NOTE) SARS-CoV-2 target nucleic acids are NOT DETECTED. The SARS-CoV-2 RNA is generally detectable in upper and lower respiratory specimens during the acute phase of infection. Negative results do not preclude SARS-CoV-2 infection, do not rule out co-infections with other pathogens, and should not be used as the sole basis for treatment or other patient management decisions. Negative results must be combined with clinical observations, patient history, and epidemiological information. The expected result is Negative. Fact Sheet for Patients: SugarRollEstradabe Fact Sheet for Healthcare Providers: https://wwwEstradawoods-mathewsEstradacom/ This test is not yet approved or cleared by the [place] FDA and  has been authorized  for detection and/or diagnosis of SARS-CoV-2 by FDA under an Emergency Use Authorization (EUA). This EUA will remain  in effect (meaning this test can be used) for the duration of the COVID-19 declaration under Section 56 4(b)(1) of the Act, 21 UEstradaSEstradaC. section 360bbb-3(b)(1), unless the authorization is terminated or revoked sooner. Performed at [facility], [place] [address] [place], [place] [address]       Radiology Studies: No results found.   Scheduled Meds: . aspirin  81 mg Oral Daily  . cefdinir  300 mg Oral Q12H  . Chlorhexidine Gluconate Cloth  6 each Topical Daily  . docusate sodium  100 mg Oral BID  . enoxaparin (LOVENOX) injection  40 mg Subcutaneous Q24H  . gabapentin  100 mg Oral BID  . levothyroxine  50 mcg Oral Daily  . losartan  100 mg Oral Daily  . magnesium oxide  400 mg Oral Daily   Continuous Infusions:   LOS: 5 days    Time spent: 25 minutes spent in the coordination of care today.    Jonnie Finner, DO [facility] Pager [phone]  If 7PM-7AM, please contact night-coverage [email or web address] Password [email or web address] 02/08/2019, 5:03 PM

## 2019-02-08 NOTE — Progress Notes (Signed)
Occupational Therapy Treatment Patient Details Name: [name] MRN: [id number] DOB: [birth year] Today's Date: 02/08/2019    History of present illness  85 year old female s/p R IM nailing following R closed hip flexor a fall in her home.  PMH includes L IM nail, osteopenia, CA and spinal stenosis.   OT comments  Session limited by pain and tearfulness.  Pt reporting pain and fatigue, but agreeable to attempting sit <> stand.  During rolling, pt reporting increased pain in Lt hip therefore declining OOB activity.  Repositioned in bed, mod assist, to improve positioning and place pillow between legs to further support Lt hip.  Pt tearful as she is requesting to d/c home.  Pt stating "[name] is making everything so hard".  Pt tearful as she recalls her late son.  Pt will continue to benefit from OT acutely to increase activity tolerance with self-care tasks.  Continue to recommend SNF for additional therapy prior to d/c home as pt does not have 24/7 supervision/assist.  Follow Up Recommendations  SNF;Supervision/Assistance - 24 hour    Equipment Recommendations  3 in 1 bedside commode       Precautions / Restrictions Precautions Precautions: Fall Precaution Comments: has been falling since second dc to home       Mobility Bed Mobility Overal bed mobility: Needs Assistance Bed Mobility: Rolling Rolling: Mod assist                     ADL either performed or assessed with clinical judgement   ADL Overall ADL's : Needs assistance/impaired             Lower Body Bathing: Maximal assistance;Bed level       Lower Body Dressing: Maximal assistance;Bed level                 General ADL Comments: Pt limited by pain in hip, declining OOB activity due to pain with bed mobility               Cognition Arousal/Alertness: Awake/alert Behavior During Therapy: WFL for tasks assessed/performed Overall Cognitive Status: No family/caregiver present to determine  baseline cognitive functioning                                                     Pertinent Vitals/ Pain       Pain Assessment: Faces Faces Pain Scale: Hurts even more Pain Location: Lt hip Pain Descriptors / Indicators: Aching;Crying Pain Intervention(s): Limited activity within patient's tolerance;Repositioned         Frequency  Min 2X/week        Progress Toward Goals  OT Goals(current goals can now be found in the care plan section)  Progress towards OT goals: Progressing toward goals  Acute Rehab OT Goals Patient Stated Goal: to go home OT Goal Formulation: With patient Time For Goal Achievement: 02/18/19 Potential to Achieve Goals: Good  Plan Discharge plan remains appropriate       AM-PAC OT "6 Clicks" Daily Activity     Outcome Measure   Help from another person eating meals?: A Little Help from another person taking care of personal grooming?: A Little Help from another person toileting, which includes using toliet, bedpan, or urinal?: A Lot Help from another person bathing (including washing, rinsing, drying)?: A Lot Help from another person  to put on and taking off regular upper body clothing?: A Lot Help from another person to put on and taking off regular lower body clothing?: Total 6 Click Score: 13    End of Session    OT Visit Diagnosis: Unsteadiness on feet (R26.81);Muscle weakness (generalized) (M62.81)   Activity Tolerance Patient limited by pain   Patient Left in bed;with call bell/phone within reach;with bed alarm set   Nurse Communication Mobility status        Time: [id number] OT Time Calculation (min): 16 min  Charges: OT General Charges $OT Visit: 1 Visit OT Treatments $Self Care/Home Management : 8-22 mins     Simonne Come [phone] 02/08/2019, 3:42 PM

## 2019-02-09 ENCOUNTER — Ambulatory Visit: Payer: Medicare Other | Admitting: Urology

## 2019-02-09 ENCOUNTER — Ambulatory Visit: Payer: Medicare Other | Admitting: Internal Medicine

## 2019-02-09 DIAGNOSIS — Z1159 Encounter for screening for other viral diseases: Secondary | ICD-10-CM | POA: Diagnosis not present

## 2019-02-09 DIAGNOSIS — E039 Hypothyroidism, unspecified: Secondary | ICD-10-CM | POA: Diagnosis not present

## 2019-02-09 DIAGNOSIS — I2510 Atherosclerotic heart disease of native coronary artery without angina pectoris: Secondary | ICD-10-CM | POA: Diagnosis not present

## 2019-02-09 DIAGNOSIS — Z299 Encounter for prophylactic measures, unspecified: Secondary | ICD-10-CM | POA: Diagnosis not present

## 2019-02-09 DIAGNOSIS — D508 Other iron deficiency anemias: Secondary | ICD-10-CM | POA: Diagnosis not present

## 2019-02-09 DIAGNOSIS — L89154 Pressure ulcer of sacral region, stage 4: Secondary | ICD-10-CM | POA: Diagnosis not present

## 2019-02-09 DIAGNOSIS — E559 Vitamin D deficiency, unspecified: Secondary | ICD-10-CM | POA: Diagnosis not present

## 2019-02-09 DIAGNOSIS — F0391 Unspecified dementia with behavioral disturbance: Secondary | ICD-10-CM | POA: Diagnosis not present

## 2019-02-09 DIAGNOSIS — M6281 Muscle weakness (generalized): Secondary | ICD-10-CM | POA: Diagnosis not present

## 2019-02-09 DIAGNOSIS — T83511D Infection and inflammatory reaction due to indwelling urethral catheter, subsequent encounter: Secondary | ICD-10-CM | POA: Diagnosis not present

## 2019-02-09 DIAGNOSIS — E569 Vitamin deficiency, unspecified: Secondary | ICD-10-CM | POA: Diagnosis not present

## 2019-02-09 DIAGNOSIS — R52 Pain, unspecified: Secondary | ICD-10-CM | POA: Diagnosis not present

## 2019-02-09 DIAGNOSIS — N390 Urinary tract infection, site not specified: Secondary | ICD-10-CM | POA: Diagnosis not present

## 2019-02-09 DIAGNOSIS — L89623 Pressure ulcer of left heel, stage 3: Secondary | ICD-10-CM | POA: Diagnosis not present

## 2019-02-09 DIAGNOSIS — I10 Essential (primary) hypertension: Secondary | ICD-10-CM | POA: Diagnosis not present

## 2019-02-09 LAB — MAGNESIUM: Magnesium: 1.7 mg/dL (ref 1.7–2.4)

## 2019-02-09 LAB — CBC WITH DIFFERENTIAL/PLATELET
Abs Immature Granulocytes: 0.08 10*3/uL — ABNORMAL HIGH (ref 0.00–0.07)
Basophils Absolute: 0.1 10*3/uL (ref 0.0–0.1)
Basophils Relative: 1 %
Eosinophils Absolute: 0.8 10*3/uL — ABNORMAL HIGH (ref 0.0–0.5)
Eosinophils Relative: 7 %
HCT: 30.3 % — ABNORMAL LOW (ref 36.0–46.0)
Hemoglobin: 9.7 g/dL — ABNORMAL LOW (ref 12.0–15.0)
Immature Granulocytes: 1 %
Lymphocytes Relative: 16 %
Lymphs Abs: 1.7 10*3/uL (ref 0.7–4.0)
MCH: 29.3 pg (ref 26.0–34.0)
MCHC: 32 g/dL (ref 30.0–36.0)
MCV: 91.5 fL (ref 80.0–100.0)
Monocytes Absolute: 1.1 10*3/uL — ABNORMAL HIGH (ref 0.1–1.0)
Monocytes Relative: 10 %
Neutro Abs: 7.2 10*3/uL (ref 1.7–7.7)
Neutrophils Relative %: 65 %
Platelets: 437 10*3/uL — ABNORMAL HIGH (ref 150–400)
RBC: 3.31 MIL/uL — ABNORMAL LOW (ref 3.87–5.11)
RDW: 16.3 % — ABNORMAL HIGH (ref 11.5–15.5)
WBC: 11 10*3/uL — ABNORMAL HIGH (ref 4.0–10.5)
nRBC: 0 % (ref 0.0–0.2)

## 2019-02-09 LAB — RENAL FUNCTION PANEL
Albumin: 2.1 g/dL — ABNORMAL LOW (ref 3.5–5.0)
Anion gap: 13 (ref 5–15)
BUN: 8 mg/dL (ref 8–23)
CO2: 23 mmol/L (ref 22–32)
Calcium: 8.7 mg/dL — ABNORMAL LOW (ref 8.9–10.3)
Chloride: 104 mmol/L (ref 98–111)
Creatinine, Ser: 0.71 mg/dL (ref 0.44–1.00)
GFR calc Af Amer: >60 mL/min (ref >60)
GFR calc non Af Amer: >60 mL/min (ref >60)
Glucose, Bld: 95 mg/dL (ref 70–99)
Phosphorus: 3.1 mg/dL (ref 2.5–4.6)
Potassium: 3.9 mmol/L (ref 3.5–5.1)
Sodium: 140 mmol/L (ref 135–145)

## 2019-02-09 MED ORDER — TRAMADOL HCL 50 MG PO TABS: 50.0000 mg | ORAL_TABLET | Freq: Four times a day (QID) | ORAL | 0 refills | Status: AC | PRN

## 2019-02-09 NOTE — Plan of Care (Signed)
  Problem: Activity: Goal: Risk for activity intolerance will decrease Outcome: Progressing   

## 2019-02-09 NOTE — Progress Notes (Signed)
Patient discharged to [facility],transported by [facility]. Patient's belongings (clothes) sent with her. Peripheral IV already taken out by day RN. Chrisopher Pustejovsky, Wonda Cheng, Therapist, sports

## 2019-02-09 NOTE — Discharge Summary (Addendum)
Physician Discharge Summary  [name] [id number] DOB: [birth year] DOA: 02/02/2019  PCP: McLean-Scocuzza, Nino Glow, MD  Admit date: 02/02/2019 Discharge date: 02/09/2019  Admitted From: Home Disposition:  SNF  Recommendations for Outpatient Follow-up:  1. Follow up with PCP in 1 week  Discharge Condition: Stable  CODE STATUS: DNR  Diet recommendation: Regular diet   Brief/Interim Summary: [name] an 85 y.o.femalewith medical history significant for hypothyroidism, hypertension, hyperlipidemia, diabetes mellitus, multiple falls, recent bilateral hip fractures requiring surgery, stage IV sacral decubitus ulcer, urinary retention requiring Foley catheter, anemia, dementia, breast and uterine cancer. She was recently discharged from [place] home on 01/28/2019 but apparently family has had a difficult time taking care of the patient. She was brought to the hospital after she was found on the floor at home.  She was found to have acute UTI, treated with IV antibiotics.  Adult protective service was involved due to concern for patient care at home, poor social situation.  Social worker has been involved during patient's hospitalization as well.    Discharge Diagnoses:  Principal Problem:   UTI (urinary tract infection) due to urinary indwelling Foley catheter [facility]) Active Problems:   Essential hypertension   Hypothyroidism   Falls frequently   Hospital admission due to social situation   Stage IV pressure ulcer of sacral region [facility])   Dementia with behavioral disturbance ([place])   Acute UTI in a patient with chronic indwelling Foley catheter, POA  - UCx: E. coli and Proteus mirabilis sensative to cetriaxone, cipro. - Completed 7 days antibiotics   Hypertension - continue losartan  Hypothyroidism  - continue synthroid  Frequent falls with recent right hip fracture in October, s/p ORIF - PT/OT recommended SNF  Stage IV  sacral decubitus ulcer (POA).  - Continue local wound care - Moist gauze packing to promote healing, foam dressing to protect from further injury. - turns as prescribed  Poor social situation - APS involved in case    Discharge Instructions  Discharge Instructions    Call MD for:  difficulty breathing, headache or visual disturbances   Complete by: As directed    Call MD for:  extreme fatigue   Complete by: As directed    Call MD for:  persistant dizziness or light-headedness   Complete by: As directed    Call MD for:  persistant nausea and vomiting   Complete by: As directed    Call MD for:  severe uncontrolled pain   Complete by: As directed    Call MD for:  temperature >100.4   Complete by: As directed    Diet general   Complete by: As directed    Discharge instructions   Complete by: As directed    You were cared for by a hospitalist during your hospital stay. If you have any questions about your discharge medications or the care you received while you were in the hospital after you are discharged, you can call the unit and ask to speak with the hospitalist on call if the hospitalist that took care of you is not available. Once you are discharged, your primary care physician will handle any further medical issues. Please note that NO REFILLS for any discharge medications will be authorized once you are discharged, as it is imperative that you return to your primary care physician (or establish a relationship with a primary care physician if you do not have one) for your aftercare needs so that they can reassess your need for  medications and monitor your lab values.   Increase activity slowly   Complete by: As directed      Allergies as of 02/09/2019      Reactions   Amlodipine Other (See Comments)   Leg edema    Atenolol Other (See Comments)   REACTION: bradycardia   Erythromycin Base Other (See Comments)   REACTION: stomach upset   Lisinopril Other (See  Comments)   REACTION: cough  (20 mg)      Medication List    TAKE these medications   aspirin 81 MG tablet Take 81 mg by mouth daily. Daily with food   calcium carbonate 1250 (500 Ca) MG tablet Commonly known as: OS-CAL - dosed in mg of elemental calcium Take 1 tablet (500 mg of elemental calcium total) by mouth daily with breakfast.   docusate sodium 100 MG capsule Commonly known as: COLACE Take 1 capsule (100 mg total) by mouth 2 (two) times daily as needed for mild constipation.   enoxaparin 40 MG/0.4ML injection Commonly known as: LOVENOX Inject 0.4 mLs (40 mg total) into the skin daily for 14 days.   ferrous sulfate 325 (65 FE) MG tablet Take 1 tablet (325 mg total) by mouth daily.   gabapentin 100 MG capsule Commonly known as: NEURONTIN TAKE ONE (1) CAPSULE BY MOUTH 2 TIMES DAILY What changed:   how much to take  how to take this  when to take this   levothyroxine 50 MCG tablet Commonly known as: SYNTHROID Take 1 tablet (50 mcg total) by mouth daily.   losartan 100 MG tablet Commonly known as: COZAAR Take 1 tablet (100 mg total) by mouth daily.   multivitamin capsule Take 1 capsule by mouth daily.   polyethylene glycol powder 17 GM/SCOOP powder Commonly known as: GLYCOLAX/MIRALAX Take 17 g by mouth daily as needed. As directed for constipation   traMADol 50 MG tablet Commonly known as: ULTRAM Take 1 tablet (50 mg total) by mouth every 6 (six) hours as needed for moderate pain.   vitamin C 500 MG tablet Commonly known as: ASCORBIC ACID Take 500 mg by mouth daily.       Contact information for follow-up providers    McLean-Scocuzza, Nino Glow, MD. Schedule an appointment as soon as possible for a visit in 1 week(s).   Specialty: Internal Medicine Contact information: [place] [address] [phone]            Contact information for after-discharge care    Destination    [facility] Preferred [facility]F .    Service: Skilled Nursing Contact information: [place] [phone]                 Allergies  Allergen Reactions  . Amlodipine Other (See Comments)    Leg edema   . Atenolol Other (See Comments)    REACTION: bradycardia  . Erythromycin Base Other (See Comments)    REACTION: stomach upset  . Lisinopril Other (See Comments)    REACTION: cough  (20 mg)     Procedures/Studies: Dg Lumbar Spine Complete  Result Date: 02/02/2019 CLINICAL DATA:  Recent fall with low back pain, initial encounter EXAM: LUMBAR SPINE - COMPLETE 4+ VIEW COMPARISON:  MRI from 10/11/2011 FINDINGS: Stable L2 compression deformity is noted. Anterolisthesis of L4 on L5 is noted stable from the prior exam. Degenerative facet changes are seen. No acute fracture is noted. IMPRESSION: Chronic changes in the lumbar spine stable from previous  exam. Electronically Signed   By: Inez Catalina M.D.   On: 02/02/2019 22:28   Dg Sacrum/coccyx  Result Date: 02/02/2019 CLINICAL DATA:  Recent fall with overlying sacral soft tissue wound, initial encounter EXAM: SACRUM AND COCCYX - 2+ VIEW COMPARISON:  None. FINDINGS: Pelvic ring is stable from previous hip exams. Prior fixation of the proximal femoral fractures are noted. Considerable artifact over the sacrum is seen. No definitive fracture is noted. If there is clinical concern CT would be helpful. IMPRESSION: No definitive fracture seen although considerable artifact is noted due to the cross-table technique. If there is strong suspicion for sacral fracture CT could be performed. Electronically Signed   By: Inez Catalina M.D.   On: 02/02/2019 22:30   Ct Head Wo Contrast  Result Date: 02/02/2019 CLINICAL DATA:  fall unknown head or neck trauma; Head trauma, minor, GCS>=13, low clinical risk, initial exam fall unknown head or neck trauma Found by neighbor lying on the floor. Fall this morning. EXAM: CT HEAD WITHOUT CONTRAST CT CERVICAL  SPINE WITHOUT CONTRAST TECHNIQUE: Multidetector CT imaging of the head and cervical spine was performed following the standard protocol without intravenous contrast. Multiplanar CT image reconstructions of the cervical spine were also generated. COMPARISON:  Head and cervical spine CT 12/12/2018 FINDINGS: CT HEAD FINDINGS Brain: No intracranial hemorrhage, mass effect, or midline shift. Unchanged degree of atrophy and chronic small vessel ischemia. 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 No hydrocephalus. The basilar cisterns are patent. No evidence of territorial infarct or acute ischemia. No extra-axial or intracranial fluid collection. Vascular: Atherosclerosis of skullbase vasculature without hyperdense vessel or abnormal calcification. Skull: No fracture or focal lesion. Sinuses/Orbits: Paranasal sinuses and mastoid air cells are clear. The visualized orbits are unremarkable. Bilateral cataract resection. Other: None. CT CERVICAL SPINE FINDINGS Alignment: No traumatic subluxation. Slight anterolisthesis of C5 on C6 and C4 on C5 and is unchanged. Skull base and vertebrae: No acute fracture. Vertebral body heights are maintained. The dens and skull base are intact. Soft tissues and spinal canal: No prevertebral fluid or swelling. No visible canal hematoma. Disc levels: Diffuse degenerative disc disease, most prominent at C6-C7. Multilevel facet hypertrophy. Degenerative changes are stable from prior. Upper chest: Heterogeneous enlarged right lobe of the thyroid gland consistent with goiter. No dominant nodule. No further evaluation recommended given patient's advanced age. Carotid calcifications. Other: None. IMPRESSION: 1. No acute intracranial abnormality. No skull fracture. Stable atrophy and chronic small vessel ischemia. 2. Multilevel degenerative change in the cervical spine without acute fracture or subluxation. Electronically Signed   By: Keith Rake M.D.   On: 02/02/2019 21:54    Ct Cervical Spine Wo Contrast  Result Date: 02/02/2019 CLINICAL DATA:  fall unknown head or neck trauma; Head trauma, minor, GCS>=13, low clinical risk, initial exam fall unknown head or neck trauma Found by neighbor lying on the floor. Fall this morning. EXAM: CT HEAD WITHOUT CONTRAST CT CERVICAL SPINE WITHOUT CONTRAST TECHNIQUE: Multidetector CT imaging of the head and cervical spine was performed following the standard protocol without intravenous contrast. Multiplanar CT image reconstructions of the cervical spine were also generated. COMPARISON:  Head and cervical spine CT 12/12/2018 FINDINGS: CT HEAD FINDINGS Brain: No intracranial hemorrhage, mass effect, or midline shift. Unchanged degree of atrophy and chronic small vessel ischemia. 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 No hydrocephalus. The basilar cisterns are patent. No evidence of territorial infarct or acute ischemia. No extra-axial or intracranial fluid collection. Vascular: Atherosclerosis of  skullbase vasculature without hyperdense vessel or abnormal calcification. Skull: No fracture or focal lesion. Sinuses/Orbits: Paranasal sinuses and mastoid air cells are clear. The visualized orbits are unremarkable. Bilateral cataract resection. Other: None. CT CERVICAL SPINE FINDINGS Alignment: No traumatic subluxation. Slight anterolisthesis of C5 on C6 and C4 on C5 and is unchanged. Skull base and vertebrae: No acute fracture. Vertebral body heights are maintained. The dens and skull base are intact. Soft tissues and spinal canal: No prevertebral fluid or swelling. No visible canal hematoma. Disc levels: Diffuse degenerative disc disease, most prominent at C6-C7. Multilevel facet hypertrophy. Degenerative changes are stable from prior. Upper chest: Heterogeneous enlarged right lobe of the thyroid gland consistent with goiter. No dominant nodule. No further evaluation recommended given patient's advanced age. Carotid calcifications.  Other: None. IMPRESSION: 1. No acute intracranial abnormality. No skull fracture. Stable atrophy and chronic small vessel ischemia. 2. Multilevel degenerative change in the cervical spine without acute fracture or subluxation. Electronically Signed   By: Keith Rake M.D.   On: 02/02/2019 21:54   Ct Pelvis Wo Contrast  Result Date: 02/03/2019 CLINICAL DATA:  Pelvic trauma.  Fall at home last night.  Dementia. EXAM: CT PELVIS WITHOUT CONTRAST TECHNIQUE: Multidetector CT imaging of the pelvis was performed following the standard protocol without intravenous contrast. COMPARISON:  Plain films 02/02/2019, 12/12/2018 FINDINGS: Urinary Tract: Foley catheter present within the bladder which is decompressed. Visualized ureters unremarkable. Bowel: Moderate fecal retention over the rectum with minimal associated wall thickening. Remainder of the colon is unremarkable. Appendix and visualized small bowel are normal. Vascular/Lymphatic: Calcified plaque over the distal abdominal aorta and common iliac arteries. No adenopathy. Reproductive:  Previous hysterectomy. Other:  No free fluid. Musculoskeletal: Bilateral intramedullary nails over the femurs with associated compression screws bridging the femoral neck is into the femoral heads as hardware is intact. Evidence of patient's previous bilateral intertrochanteric fractures. No evidence of acute re-injury. Old left inferior pubic ramus fracture. No new acute fractures identified. Moderate spondylosis of the lumbosacral spine with mild disc disease at the L3-4 and L4-5 levels. Mild-to-moderate canal stenosis at the L3-4 and L4-5 levels. IMPRESSION: 1.  No acute fractures. 2. Evidence patient's previous bilateral intertrochanteric hip fractures post fixation with hardware intact. Old left inferior pubic ramus fracture. 3. Moderate spondylosis of the spine with disc disease as described and mild-to-moderate canal stenosis at the L3-4 and L4-5 levels. 4.  Aortic  Atherosclerosis (ICD10-I70.0). 5.  Moderate fecal retention over the rectum. Electronically Signed   By: Marin Olp M.D.   On: 02/03/2019 10:06   Dg Knee Complete 4 Views Left  Result Date: 02/02/2019 CLINICAL DATA:  Recent fall with left knee pain, initial encounter EXAM: LEFT KNEE - COMPLETE 4+ VIEW COMPARISON:  None. FINDINGS: Changes consistent with the prior medullary rod placement in the femur are seen. Mild irregularity in the proximal fibular shaft is noted consistent with prior healed fracture. No acute fracture or dislocation is noted. Degenerative changes of the knee joint are seen as well. No joint effusion is noted. IMPRESSION: Chronic changes without acute abnormality. Electronically Signed   By: Inez Catalina M.D.   On: 02/02/2019 22:25   Dg Knee Complete 4 Views Right  Result Date: 02/02/2019 CLINICAL DATA:  Recent fall with right knee pain, initial encounter EXAM: RIGHT KNEE - COMPLETE 4+ VIEW COMPARISON:  None. FINDINGS: Changes consistent with prior fixation of the known femoral fracture are seen. Degenerative changes are noted particularly in the medial joint space. No acute fracture is  seen. Small joint effusion is noted. IMPRESSION: Chronic changes without acute abnormality. Electronically Signed   By: Inez Catalina M.D.   On: 02/02/2019 22:27   Dg Hip Unilat With Pelvis 2-3 Views Left  Result Date: 02/02/2019 CLINICAL DATA:  Recent fall, initial encounter EXAM: DG HIP (WITH OR WITHOUT PELVIS) 3V LEFT COMPARISON:  10/28/2018 FINDINGS: Changes consistent with prior proximal left femoral fracture are seen. Surgical fixation is noted. No new fracture is noted. Healing inferior pubic ramus fracture on the left is noted. No other focal abnormality is seen. IMPRESSION: Status post ORIF of proximal left femoral fracture. No acute abnormality is noted. Electronically Signed   By: Inez Catalina M.D.   On: 02/02/2019 22:23   Dg Hip Unilat With Pelvis 2-3 Views Right  Result Date:  02/02/2019 CLINICAL DATA:  Recent fall, initial encounter EXAM: DG HIP (WITH OR WITHOUT PELVIS) 3V RIGHT COMPARISON:  12/12/2018 FINDINGS: Previously seen right intratrochanteric fracture is again noted with prior surgical fixation. No dislocation is noted. No new definitive fracture is seen. Prior fixation of left femoral fracture is noted with some mild callus formation. Left inferior pubic ramus fracture with healing is noted as well. IMPRESSION: Changes of prior fractures bilaterally with surgical fixation. No new focal abnormality is seen. Electronically Signed   By: Inez Catalina M.D.   On: 02/02/2019 22:22   Dg Femur Min 2 Views Left  Result Date: 02/02/2019 CLINICAL DATA:  Recent fall with left leg pain, initial encounter EXAM: LEFT FEMUR 2 VIEWS COMPARISON:  10/27/2018 FINDINGS: Postsurgical changes are seen in the proximal left femur with mild healing. Old healed left inferior pubic ramus fracture is noted. No hardware abnormality is seen. No new femoral fracture is noted. IMPRESSION: Posttraumatic and postsurgical changes. No acute abnormality is noted. Electronically Signed   By: Inez Catalina M.D.   On: 02/02/2019 22:31       Discharge Exam: Vitals:   02/09/19 0524 02/09/19 0830  BP: (!) 133/56 (!) 140/52  Pulse: 72 68  Resp: 18 20  Temp: 97.8 F (36.6 C) 98.2 F (36.8 C)  SpO2: 97% 98%    General: Pt is alert, awake, not in acute distress Cardiovascular: RRR, S1/S2 +, no edema Respiratory: CTA bilaterally, no wheezing, no rhonchi, no respiratory distress, no conversational dyspnea  Abdominal: Soft, NT, ND, bowel sounds + Extremities: no edema, no cyanosis Psych: Stable    The results of significant diagnostics from this hospitalization (including imaging, microbiology, ancillary and laboratory) are listed below for reference.     Microbiology: Recent Results (from the past 240 hour(s))  Urine culture     Status: Abnormal   Collection Time: 02/02/19 11:12 PM    Specimen: Urine, Random  Result Value Ref Range Status   Specimen Description URINE, RANDOM  Final   Special Requests   Final    NONE Performed at [facility], [address] [place], [place] [address]    Culture (A)  Final    >=100,000 COLONIES/mL PROTEUS MIRABILIS >=100,000 COLONIES/mL ESCHERICHIA COLI    Report Status 02/05/2019 FINAL  Final   Organism ID, Bacteria PROTEUS MIRABILIS (A)  Final   Organism ID, Bacteria ESCHERICHIA COLI (A)  Final      Susceptibility   Escherichia coli - MIC*    AMPICILLIN >=32 RESISTANT Resistant     CEFAZOLIN >=64 RESISTANT Resistant     CEFTRIAXONE 8 SENSITIVE Sensitive     CIPROFLOXACIN <=0.25 SENSITIVE Sensitive     GENTAMICIN <=1  SENSITIVE Sensitive     IMIPENEM <=0.25 SENSITIVE Sensitive     NITROFURANTOIN <=16 SENSITIVE Sensitive     TRIMETH/SULFA >=320 RESISTANT Resistant     AMPICILLIN/SULBACTAM >=32 RESISTANT Resistant     PIP/TAZO <=4 SENSITIVE Sensitive     Extended ESBL NEGATIVE Sensitive     * >=100,000 COLONIES/mL ESCHERICHIA COLI   Proteus mirabilis - MIC*    AMPICILLIN <=2 SENSITIVE Sensitive     CEFAZOLIN <=4 SENSITIVE Sensitive     CEFTRIAXONE <=1 SENSITIVE Sensitive     CIPROFLOXACIN <=0.25 SENSITIVE Sensitive     GENTAMICIN <=1 SENSITIVE Sensitive     IMIPENEM 2 SENSITIVE Sensitive     NITROFURANTOIN 128 RESISTANT Resistant     TRIMETH/SULFA <=20 SENSITIVE Sensitive     AMPICILLIN/SULBACTAM <=2 SENSITIVE Sensitive     PIP/TAZO <=4 SENSITIVE Sensitive     * >=100,000 COLONIES/mL PROTEUS MIRABILIS  SARS CORONAVIRUS 2 (TAT 6-24 HRS) Nasopharyngeal Nasopharyngeal Swab     Status: None   Collection Time: 02/03/19 10:49 AM   Specimen: Nasopharyngeal Swab  Result Value Ref Range Status   SARS Coronavirus 2 NEGATIVE NEGATIVE Final    Comment: (NOTE) SARS-CoV-2 target nucleic acids are NOT DETECTED. The SARS-CoV-2 RNA is generally detectable in upper and lower respiratory specimens during the acute phase of  infection. Negative results do not preclude SARS-CoV-2 infection, do not rule out co-infections with other pathogens, and should not be used as the sole basis for treatment or other patient management decisions. Negative results must be combined with clinical observations, patient history, and epidemiological information. The expected result is Negative. Fact Sheet for Patients: SugarRoll.be Fact Sheet for Healthcare Providers: https://www.woods-mathews.com/ This test is not yet approved or cleared by the [place] FDA and  has been authorized for detection and/or diagnosis of SARS-CoV-2 by FDA under an Emergency Use Authorization (EUA). This EUA will remain  in effect (meaning this test can be used) for the duration of the COVID-19 declaration under Section 56 4(b)(1) of the Act, 21 U.S.C. section 360bbb-3(b)(1), unless the authorization is terminated or revoked sooner. Performed at [facility], [place] [address] [place], [place] [address]   SARS CORONAVIRUS 2 (TAT 6-24 HRS) Nasopharyngeal Nasopharyngeal Swab     Status: None   Collection Time: 02/07/19  1:01 PM   Specimen: Nasopharyngeal Swab  Result Value Ref Range Status   SARS Coronavirus 2 NEGATIVE NEGATIVE Final    Comment: (NOTE) SARS-CoV-2 target nucleic acids are NOT DETECTED. The SARS-CoV-2 RNA is generally detectable in upper and lower respiratory specimens during the acute phase of infection. Negative results do not preclude SARS-CoV-2 infection, do not rule out co-infections with other pathogens, and should not be used as the sole basis for treatment or other patient management decisions. Negative results must be combined with clinical observations, patient history, and epidemiological information. The expected result is Negative. Fact Sheet for Patients: SugarRoll.be Fact Sheet for Healthcare  Providers: https://www.woods-mathews.com/ This test is not yet approved or cleared by the [place] FDA and  has been authorized for detection and/or diagnosis of SARS-CoV-2 by FDA under an Emergency Use Authorization (EUA). This EUA will remain  in effect (meaning this test can be used) for the duration of the COVID-19 declaration under Section 56 4(b)(1) of the Act, 21 U.S.C. section 360bbb-3(b)(1), unless the authorization is terminated or revoked sooner. Performed at [facility], [place] [address] [place], [place] [address]      Labs: BNP (last 3 results) No  results for input(s): BNP in the last 8760 hours. Basic Metabolic Panel: Recent Labs  Lab 02/02/19 2049 02/03/19 1104 02/07/19 0415 02/08/19 0525 02/09/19 0508  NA 141 140 138 139 140  K 4.4 3.8 3.7 4.0 3.9  CL 107 106 107 107 104  CO2 24 25 24 23 23   GLUCOSE 154* 115* 95 100* 95  BUN 26* 21 13 9 8   CREATININE 0.98 0.89 0.71 0.71 0.71  CALCIUM 9.3 8.9 8.6* 9.1 8.7*  MG  --   --  1.6* 1.7 1.7  PHOS  --   --  3.1 2.9 3.1   Liver Function Tests: Recent Labs  Lab 02/02/19 2049 02/07/19 0415 02/08/19 0525 02/09/19 0508  AST 23  --   --   --   ALT 10  --   --   --   ALKPHOS 91  --   --   --   BILITOT 0.9  --   --   --   PROT 6.9  --   --   --   ALBUMIN 2.7* 2.0* 2.2* 2.1*   No results for input(s): LIPASE, AMYLASE in the last 168 hours. No results for input(s): AMMONIA in the last 168 hours. CBC: Recent Labs  Lab 02/02/19 2049 02/03/19 1104 02/05/19 0517 02/07/19 0415 02/08/19 0525 02/09/19 0508  WBC 18.5* 15.2* 11.1* 10.2 10.7* 11.0*  NEUTROABS 16.4* 12.6*  --  6.9 7.0 7.2  HGB 11.7* 10.5* 9.2* 9.2* 10.4* 9.7*  HCT 37.2 32.9* 29.5* 29.3* 33.6* 30.3*  MCV 93.5 92.9 93.1 92.1 93.3 91.5  PLT 445* 395 350 351 419* 437*   Cardiac Enzymes: Recent Labs  Lab 02/02/19 2049  CKTOTAL 134   BNP: Invalid input(s): POCBNP CBG: Recent Labs  Lab 02/03/19 0158  GLUCAP 123*    D-Dimer No results for input(s): DDIMER in the last 72 hours. Hgb A1c No results for input(s): HGBA1C in the last 72 hours. Lipid Profile No results for input(s): CHOL, HDL, LDLCALC, TRIG, CHOLHDL, LDLDIRECT in the last 72 hours. Thyroid function studies No results for input(s): TSH, T4TOTAL, T3FREE, THYROIDAB in the last 72 hours.  Invalid input(s): FREET3 Anemia work up No results for input(s): VITAMINB12, FOLATE, FERRITIN, TIBC, IRON, RETICCTPCT in the last 72 hours. Urinalysis    Component Value Date/Time   COLORURINE AMBER (A) 02/02/2019 2049   APPEARANCEUR CLOUDY (A) 02/02/2019 2049   APPEARANCEUR Clear 02/04/2014 1652   LABSPEC 1.020 02/02/2019 2049   LABSPEC 1.014 02/04/2014 1652   LABSPEC 1.010 08/23/2008 1504   PHURINE 5.0 02/02/2019 2049   GLUCOSEU NEGATIVE 02/02/2019 2049   GLUCOSEU Negative 02/04/2014 1652   HGBUR NEGATIVE 02/02/2019 2049   BILIRUBINUR NEGATIVE 02/02/2019 2049   BILIRUBINUR Negative 02/04/2014 1652   KETONESUR 5 (A) 02/02/2019 2049   PROTEINUR 100 (A) 02/02/2019 2049   UROBILINOGEN 0.2 12/26/2011 1229   UROBILINOGEN 1.0 10/10/2011 0034   NITRITE POSITIVE (A) 02/02/2019 2049   LEUKOCYTESUR MODERATE (A) 02/02/2019 2049   LEUKOCYTESUR Trace 02/04/2014 1652   Sepsis Labs Invalid input(s): PROCALCITONIN,  WBC,  LACTICIDVEN Microbiology Recent Results (from the past 240 hour(s))  Urine culture     Status: Abnormal   Collection Time: 02/02/19 11:12 PM   Specimen: Urine, Random  Result Value Ref Range Status   Specimen Description URINE, RANDOM  Final   Special Requests   Final    NONE Performed at [facility], [place] [address] [place], [place] [address]    Culture (A)  Final    >=  100,000 COLONIES/mL PROTEUS MIRABILIS >=100,000 COLONIES/mL ESCHERICHIA COLI    Report Status 02/05/2019 FINAL  Final   Organism ID, Bacteria PROTEUS MIRABILIS (A)  Final   Organism ID, Bacteria ESCHERICHIA COLI (A)  Final      Susceptibility    Escherichia coli - MIC*    AMPICILLIN >=32 RESISTANT Resistant     CEFAZOLIN >=64 RESISTANT Resistant     CEFTRIAXONE 8 SENSITIVE Sensitive     CIPROFLOXACIN <=0.25 SENSITIVE Sensitive     GENTAMICIN <=1 SENSITIVE Sensitive     IMIPENEM <=0.25 SENSITIVE Sensitive     NITROFURANTOIN <=16 SENSITIVE Sensitive     TRIMETH/SULFA >=320 RESISTANT Resistant     AMPICILLIN/SULBACTAM >=32 RESISTANT Resistant     PIP/TAZO <=4 SENSITIVE Sensitive     Extended ESBL NEGATIVE Sensitive     * >=100,000 COLONIES/mL ESCHERICHIA COLI   Proteus mirabilis - MIC*    AMPICILLIN <=2 SENSITIVE Sensitive     CEFAZOLIN <=4 SENSITIVE Sensitive     CEFTRIAXONE <=1 SENSITIVE Sensitive     CIPROFLOXACIN <=0.25 SENSITIVE Sensitive     GENTAMICIN <=1 SENSITIVE Sensitive     IMIPENEM 2 SENSITIVE Sensitive     NITROFURANTOIN 128 RESISTANT Resistant     TRIMETH/SULFA <=20 SENSITIVE Sensitive     AMPICILLIN/SULBACTAM <=2 SENSITIVE Sensitive     PIP/TAZO <=4 SENSITIVE Sensitive     * >=100,000 COLONIES/mL PROTEUS MIRABILIS  SARS CORONAVIRUS 2 (TAT 6-24 HRS) Nasopharyngeal Nasopharyngeal Swab     Status: None   Collection Time: 02/03/19 10:49 AM   Specimen: Nasopharyngeal Swab  Result Value Ref Range Status   SARS Coronavirus 2 NEGATIVE NEGATIVE Final    Comment: (NOTE) SARS-CoV-2 target nucleic acids are NOT DETECTED. The SARS-CoV-2 RNA is generally detectable in upper and lower respiratory specimens during the acute phase of infection. Negative results do not preclude SARS-CoV-2 infection, do not rule out co-infections with other pathogens, and should not be used as the sole basis for treatment or other patient management decisions. Negative results must be combined with clinical observations, patient history, and epidemiological information. The expected result is Negative. Fact Sheet for Patients: SugarRoll.be Fact Sheet for Healthcare  Providers: https://www.woods-mathews.com/ This test is not yet approved or cleared by the [place] FDA and  has been authorized for detection and/or diagnosis of SARS-CoV-2 by FDA under an Emergency Use Authorization (EUA). This EUA will remain  in effect (meaning this test can be used) for the duration of the COVID-19 declaration under Section 56 4(b)(1) of the Act, 21 U.S.C. section 360bbb-3(b)(1), unless the authorization is terminated or revoked sooner. Performed at [facility], [place] [address] [place], [place] [address]   SARS CORONAVIRUS 2 (TAT 6-24 HRS) Nasopharyngeal Nasopharyngeal Swab     Status: None   Collection Time: 02/07/19  1:01 PM   Specimen: Nasopharyngeal Swab  Result Value Ref Range Status   SARS Coronavirus 2 NEGATIVE NEGATIVE Final    Comment: (NOTE) SARS-CoV-2 target nucleic acids are NOT DETECTED. The SARS-CoV-2 RNA is generally detectable in upper and lower respiratory specimens during the acute phase of infection. Negative results do not preclude SARS-CoV-2 infection, do not rule out co-infections with other pathogens, and should not be used as the sole basis for treatment or other patient management decisions. Negative results must be combined with clinical observations, patient history, and epidemiological information. The expected result is Negative. Fact Sheet for Patients: SugarRoll.be Fact Sheet for Healthcare Providers: https://www.woods-mathews.com/ This test is not yet approved or cleared by  the [employer] and  has been authorized for detection and/or diagnosis of SARS-CoV-2 by FDA under an Emergency Use Authorization (EUA). This EUA will remain  in effect (meaning this test can be used) for the duration of the COVID-19 declaration under Section 56 4(b)(1) of the Act, 21 U.S.C. section 360bbb-3(b)(1), unless the authorization is terminated or revoked sooner. Performed at  [facility], [place] [address] [place], [place] [address]      Patient was seen and examined on the day of discharge and was found to be in stable condition. Time coordinating discharge: 45 minutes including assessment and coordination of care, as well as examination of the patient.   SIGNED:  Dessa Phi, DO [facility] 02/09/2019, 3:33 PM
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
## 2019-02-09 NOTE — Progress Notes (Signed)
Changed patients sacrum dressing. Patient tolerated fair. Drainage was minimal.   Mickel Baas B. RN

## 2019-02-09 NOTE — TOC Transition Note (Signed)
Transition of Care [facility]) - CM/SW Discharge Note *Discharged to [facility] via ambulance   Patient Details  Name: [name] MRN: [id number] Date of Birth: 1926-05-18  Transition of Care [facility]) CM/SW Contact:  Sable Feil, LCSW Phone Number: 02/09/2019, 5:30 PM   Clinical Narrative:  Patient and son in agreement with [facility] rehab. Please see narrative in 12/2 progress note. Contact made with Tammy, admissions director at [facility] and they could accept patient. Discharge clinicals transmitted to facility and transport arranged. Nurse provided with phone and room numbers to call report. [name] was informed of conversation with her son and was very relieved that he was pleasant with CSW.      Barriers to Discharge: Continued Medical Work up   Patient Goals and CMS Choice        Discharge Placement - [facility] for ST rehab                       Discharge Plan and Services In-house Referral: Clinical Social Work Discharge Planning Services: CM Consult                                 Social Determinants of Health (SDOH) Interventions  APS involved due to a report being made. APS SW contacted regarding discharge to SNF.   Readmission Risk Interventions No flowsheet data found.

## 2019-02-11 ENCOUNTER — Telehealth: Payer: Self-pay | Admitting: *Deleted

## 2019-02-11 NOTE — Telephone Encounter (Signed)
Called and spoke with [name] in regards to patient's discharge. Ms. [name] states that the family was unable to care for her at home and the patient was transferred to [facility] in [place], [place] to receive care. Nurse stated that [facility] should be notified of her discharge from the facility and would reach out to contact her at that time and for them to contact PCP for any needed assistance. Ms. [name] verbalized understanding and was appreciative for the call.

## 2019-02-15 ENCOUNTER — Other Ambulatory Visit: Payer: Self-pay | Admitting: *Deleted

## 2019-02-15 NOTE — Patient Outreach (Signed)
Screened for potential [facility] Care Management needs as a benefit of  [employer].  [name] is currently receiving skilled therapy at [facility].  Member discussed in telephonic interdisciplinary team meeting with facility staff.  Facility reports APS is actively following member for disposition plans. [name] will reach 100 SNF days on 02/22/2019. She has used 83 days prior at a different SNF.  Will continue to follow for transition plans and disposition.   Marthenia Rolling, MSN-Ed, RN,BSN [place] Acute Care Coordinator [phone] [facility]) [phone]  (Toll free office)

## 2019-02-16 DIAGNOSIS — L89154 Pressure ulcer of sacral region, stage 4: Secondary | ICD-10-CM | POA: Diagnosis not present

## 2019-02-16 DIAGNOSIS — L89623 Pressure ulcer of left heel, stage 3: Secondary | ICD-10-CM | POA: Diagnosis not present

## 2019-02-22 ENCOUNTER — Other Ambulatory Visit: Payer: Self-pay | Admitting: *Deleted
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
## 2019-02-23 ENCOUNTER — Other Ambulatory Visit: Payer: Self-pay | Admitting: *Deleted

## 2019-02-23 ENCOUNTER — Other Ambulatory Visit: Payer: Self-pay | Admitting: Internal Medicine

## 2019-02-23 ENCOUNTER — Telehealth: Payer: Self-pay | Admitting: Internal Medicine

## 2019-02-23 DIAGNOSIS — R7303 Prediabetes: Secondary | ICD-10-CM

## 2019-02-23 DIAGNOSIS — K5900 Constipation, unspecified: Secondary | ICD-10-CM

## 2019-02-23 DIAGNOSIS — M7918 Myalgia, other site: Secondary | ICD-10-CM

## 2019-02-23 DIAGNOSIS — I10 Essential (primary) hypertension: Secondary | ICD-10-CM

## 2019-02-23 DIAGNOSIS — F0281 Dementia in other diseases classified elsewhere with behavioral disturbance: Secondary | ICD-10-CM

## 2019-02-23 DIAGNOSIS — G300 Alzheimer's disease with early onset: Secondary | ICD-10-CM

## 2019-02-23 DIAGNOSIS — N390 Urinary tract infection, site not specified: Secondary | ICD-10-CM

## 2019-02-23 DIAGNOSIS — E559 Vitamin D deficiency, unspecified: Secondary | ICD-10-CM

## 2019-02-23 DIAGNOSIS — R296 Repeated falls: Secondary | ICD-10-CM

## 2019-02-23 DIAGNOSIS — M25511 Pain in right shoulder: Secondary | ICD-10-CM

## 2019-02-23 DIAGNOSIS — Z7189 Other specified counseling: Secondary | ICD-10-CM

## 2019-02-23 DIAGNOSIS — E7800 Pure hypercholesterolemia, unspecified: Secondary | ICD-10-CM

## 2019-02-23 DIAGNOSIS — S72001D Fracture of unspecified part of neck of right femur, subsequent encounter for closed fracture with routine healing: Secondary | ICD-10-CM

## 2019-02-23 DIAGNOSIS — S72092D Other fracture of head and neck of left femur, subsequent encounter for closed fracture with routine healing: Secondary | ICD-10-CM

## 2019-02-23 DIAGNOSIS — M1990 Unspecified osteoarthritis, unspecified site: Secondary | ICD-10-CM

## 2019-02-23 DIAGNOSIS — H353130 Nonexudative age-related macular degeneration, bilateral, stage unspecified: Secondary | ICD-10-CM

## 2019-02-23 DIAGNOSIS — N3941 Urge incontinence: Secondary | ICD-10-CM

## 2019-02-23 DIAGNOSIS — Z609 Problem related to social environment, unspecified: Secondary | ICD-10-CM

## 2019-02-23 DIAGNOSIS — R2689 Other abnormalities of gait and mobility: Secondary | ICD-10-CM

## 2019-02-23 DIAGNOSIS — I6523 Occlusion and stenosis of bilateral carotid arteries: Secondary | ICD-10-CM

## 2019-02-23 DIAGNOSIS — R42 Dizziness and giddiness: Secondary | ICD-10-CM

## 2019-02-23 DIAGNOSIS — M8580 Other specified disorders of bone density and structure, unspecified site: Secondary | ICD-10-CM

## 2019-02-23 DIAGNOSIS — E0520 Thyrotoxicosis with toxic multinodular goiter without thyrotoxic crisis or storm: Secondary | ICD-10-CM

## 2019-02-23 DIAGNOSIS — N3281 Overactive bladder: Secondary | ICD-10-CM

## 2019-02-23 DIAGNOSIS — M48061 Spinal stenosis, lumbar region without neurogenic claudication: Secondary | ICD-10-CM

## 2019-02-23 DIAGNOSIS — E039 Hypothyroidism, unspecified: Secondary | ICD-10-CM

## 2019-02-23 DIAGNOSIS — S72144D Nondisplaced intertrochanteric fracture of right femur, subsequent encounter for closed fracture with routine healing: Secondary | ICD-10-CM

## 2019-02-23 DIAGNOSIS — F411 Generalized anxiety disorder: Secondary | ICD-10-CM

## 2019-02-23 DIAGNOSIS — M79671 Pain in right foot: Secondary | ICD-10-CM

## 2019-02-23 DIAGNOSIS — L89154 Pressure ulcer of sacral region, stage 4: Secondary | ICD-10-CM

## 2019-02-23 DIAGNOSIS — Z8719 Personal history of other diseases of the digestive system: Secondary | ICD-10-CM

## 2019-02-23 DIAGNOSIS — C50912 Malignant neoplasm of unspecified site of left female breast: Secondary | ICD-10-CM

## 2019-02-23 DIAGNOSIS — S300XXA Contusion of lower back and pelvis, initial encounter: Secondary | ICD-10-CM

## 2019-02-23 DIAGNOSIS — M25551 Pain in right hip: Secondary | ICD-10-CM

## 2019-02-23 NOTE — Telephone Encounter (Signed)
Yes PC referral placed   [place]

## 2019-02-23 NOTE — Telephone Encounter (Signed)
Referral sent ° °TMS °

## 2019-02-23 NOTE — Telephone Encounter (Signed)
Denise [id number] option 2 called from [facility] regarding pt is discharging from rehab today to home. Is it ok for pt to receive palliative care? Please advise and Thank you!

## 2019-02-23 NOTE — Patient Outreach (Signed)
Member screened for potential [facility] Care Management needs as a benefit of [place] Medicare.  [name] is scheduled to transition home from [facility] on today 02/23/19. Please see writer's notes from 02/22/19 for further details.  [facility] Care Management referral made for [facility] RNCM and [facility] LCSW. [facility] Palliative referral made as well.  Awaiting to hear back from facility dc planner on what home health agency [name] will have.    Marthenia Rolling, MSN-Ed, RN,BSN [place] Acute Care Coordinator [phone] [facility]) [phone]  (Toll free office)

## 2019-02-24 ENCOUNTER — Ambulatory Visit: Payer: Self-pay | Admitting: *Deleted

## 2019-02-24 ENCOUNTER — Telehealth: Payer: Self-pay

## 2019-02-25 ENCOUNTER — Telehealth: Payer: Self-pay | Admitting: Internal Medicine

## 2019-02-25 ENCOUNTER — Other Ambulatory Visit: Payer: Self-pay | Admitting: *Deleted

## 2019-02-25 ENCOUNTER — Telehealth: Payer: Self-pay

## 2019-02-25 ENCOUNTER — Ambulatory Visit: Payer: Medicare Other | Admitting: Internal Medicine

## 2019-02-25 ENCOUNTER — Telehealth: Payer: Self-pay | Admitting: *Deleted
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
## 2019-02-25 NOTE — Telephone Encounter (Signed)
Called patient social work and the hot line for adult protective services no answer left voicemail.

## 2019-02-25 NOTE — Telephone Encounter (Signed)
Telephone call to patient to schedule palliative care visit with patient. SW made multiple calls with no answer. SW did reach [place] at 4:00PM and received consent for palliative care services. [name] said he is HCPOA. [name] in agreement with home visit on 02-28-19 at 1:00 PM.

## 2019-02-25 NOTE — Patient Outreach (Addendum)
[facility]) Care Management  02/25/2019  [name] 05-Oct-1926 [id number]   Subjective:  Patient has diagnosis of dementia per chart review.   Case discussed with referral source [place] Coordinator at [facility] Management on 02/23/2019.   Telephone call to patient's home number, no answer,message states to enter remote access code, and unable to leave message.    Objective: Per KPN (Knowledge Performance Now, point of care tool) and chart review, patient hospitalized 02/02/2019 -02/09/2019 for UTI (urinary tract infection) due to urinary indwelling Foley catheter. Patient at  [facility] ([place]) for rehab, dates of service 02/09/2019 - 02/23/2019.  Patient hospitalized 12/12/2018 - 12/15/2018 for Closed nondisplaced intertrochanteric fracture of right femur, status post  Reduction and internal fixation of displaced intertrochanteric right hip fracture with Biomet Affixis on 12/13/2018.  Patient at [facility] ([place]) for rehab, dates of service Patient also has a history of hypertension, Hypothyroidism, Falls frequently, Hospital admission due to social situation, Stage IV pressure ulcer of sacral region, Dementia with behavioral disturbance, diabetes, hyperlipidemia, recent bilateral hip fractures requiring surgery, urinary retention requiring Foley catheter, anemia, dementia, breast and uterine cancer.        Assessment: Received Medicare [place] Management Post Acute Care Coordinator referral on 02/23/2019.  Referral source: Marthenia Rolling.   Referral reason: Please assign to [place] for complex case management and [facility] LCSW for complex social issues. To dc from [facility] today 02/23/19. Adamant about returning home. Has capacity. APS has been involved but likely to close case this week. Has run out of her 100 SNF days. Multiple co morbidities and falls. Was not hospice eligible in the recent past. Palliative  referral made. Will have home health services.  Transition of care follow up pending patient/  patient 's designated party release contact.      Plan: RNCM will send unsuccessful outreach letter, [facility] pamphlet, will call patient/ patient's designated party release for 2nd telephone outreach attempt within 4 business days, transition of care follow up, and proceed with case closure, within 10 business days if no return call.      Kathlee Barnhardt H. Annia Friendly, BSN, [place] Management [facility] Telephonic CM Phone: [phone] Fax: [phone]

## 2019-02-25 NOTE — Telephone Encounter (Signed)
Patients home health nurse stated she need a higher level of care and patient and her agreed to ask for hospice referral. PCP will need to place order. Spoken to [place], Therapist, sports

## 2019-02-25 NOTE — Telephone Encounter (Signed)
Palliative care referral placed has this been done?   [place]

## 2019-02-28 ENCOUNTER — Other Ambulatory Visit: Payer: Medicare Other

## 2019-02-28 ENCOUNTER — Other Ambulatory Visit: Payer: Self-pay

## 2019-02-28 ENCOUNTER — Telehealth: Payer: Self-pay | Admitting: Internal Medicine

## 2019-02-28 DIAGNOSIS — Z515 Encounter for palliative care: Secondary | ICD-10-CM
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
## 2019-02-28 NOTE — Telephone Encounter (Signed)
Palliative care along with a LCSW has arrived at her house today per pt's chart.

## 2019-02-28 NOTE — Telephone Encounter (Signed)
Noted   Thank you   TMS 

## 2019-02-28 NOTE — Telephone Encounter (Addendum)
Called and spoke with Tim Lair she stated the patient refused services so they cannot do anything, That patient is not incompetent in her evaluation so if she refuses services, the cannot do anything. I ask  to be transferred to make a new report. I was transferred received Voicemail will have to wait for return call.

## 2019-02-28 NOTE — Telephone Encounter (Signed)
Yes they did

## 2019-02-28 NOTE — Progress Notes (Signed)
COMMUNITY PALLIATIVE CARE SW NOTE  PATIENT NAME: [name] DOB: 1926-06-25 MRN: [id number]  PRIMARY CARE PROVIDER: McLean-Scocuzza, Nino Glow, MD  RESPONSIBLE PARTY:  Acct ID - Guarantor Home Phone Work Phone Relationship Acct Type  [id number] [phone] Self P/F     [place], [place], [place] [address]     PLAN OF CARE and INTERVENTIONS:             1. GOALS OF CARE/ ADVANCE CARE PLANNING:  Patient is DNR, form now in the home. HCPOA is [name] (patient's son) per patient and [name]. Goals are to remain at home and be comfortable. Patient does not want to go back to the hospital. 2. SOCIAL/EMOTIONAL/SPIRITUAL ASSESSMENT/ INTERVENTIONS:  SW and RN met with patient and [name] (patient's son) in the home. [name] provided brief history, patient recently returned home from [facility] in [place]. Patient was laying in hospital bed, watching television. Patient reports pain "all over". Patient and [name] report that patient now has a new bed sore. RN assessed sore on patient's bottom. Patient also has sores on both heels. Patient reports appetite is fair. Patient said she is sleeping on and off. Patient is a widow, and lost one of her son's in 2011 from suicide. 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 [name] lives next door and is providing support. Patient expressed her appreciation for [name] and his care. Patient enjoys being home and enjoys spending time with her dog. SW provided education on palliative and hospice services, provided emotional support, discussed care and goals of care, and used active and reflective listening. 3. PATIENT/CAREGIVER EDUCATION/ COPING:  Patient was alert, seemed confused at times. Patient was able to engage and answer questions when asked. Patient openly expressed feelings. Patient denies any concerns and said she wants to be home with her son. [name] was in and out of visit, going back and forth to his own home  next door. [name] was open in conversation, discussed his concerns for patient's wounds and said he needs more medical support to manage those. [name] agreed that he wants to patient to be at home. Educated on care and safety.  4. PERSONAL EMERGENCY PLAN:  Family/caregiver will call 9-1-1. 5. COMMUNITY RESOURCES COORDINATION/ HEALTH CARE NAVIGATION:  SW collaborated with Orrin Brigham, RN with [facility] regarding this patient's care and plan of care. SW also confirmed that [facility] was not currently seeing this patient, and had declined recent referral. SW left VM for APS SW, Crystal [phone]) with update and requested call back. RN discussed patient with CMO and left VM for PCP. 6. FINANCIAL/LEGAL CONCERNS/INTERVENTIONS:  Financial and legal concerns noted by [name]. [name] (patient's friend) has patient's check book per [name]. [name] noted that he has contacted sheriff department today and a sheriff is going to escort him to get patient's check book from [employer]. SW encouraged [name] to discuss with APS SW.     SOCIAL HX:  Social History   Tobacco Use  . Smoking status: Never Smoker  . Smokeless tobacco: Never Used  Substance Use Topics  . Alcohol use: No    Alcohol/week: 0.0 standard drinks    CODE STATUS:   Code Status: Prior (DNR) ADVANCED DIRECTIVES: Y - requested copies. MOST FORM COMPLETE:  No. HOSPICE EDUCATION PROVIDED: Yes - referral to be made for patient for hospice services based on decline. RN contacted CMO for [facility] and left VM for PCP. Patient and family in agreement with  referral and notified [name] that the nurse will contact him to schedule admission visit.  PPS: Patient is dependent of most ADLs. Patient is able to feed herself.  I spent 75 minutes with patient/family, from 12:15-1:30p providing education, support and consultation.   Margaretmary Lombard, LCSW

## 2019-02-28 NOTE — Progress Notes (Signed)
PATIENT NAME: [name] DOB: 03-27-1926 MRN: [id number]  PRIMARY CARE PROVIDER: McLean-Scocuzza, Nino Glow, MD  RESPONSIBLE PARTY:  Acct ID - Guarantor Home Phone Work Phone Relationship Acct Type  [id number] Caprice [phone] Self P/F     [address], [place], [place] [address]    PLAN OF CARE and INTERVENTIONS:               1.  GOALS OF CARE/ ADVANCE CARE PLANNING:  Remain at home and not return to the hospital.               2.  PATIENT/CAREGIVER EDUCATION: Education on s/s of infection, education on hospice verses palliative care, education on patient needing 24/7 care, reviewed meds, support                 3.  DISEASE STATUS:SW and RN made home care visit. Patient lying in bed. SW called patients son [name] who informs SW and RN to enter home as door was unlocked. [name] was in his home next door. [name] arrives at patient's home and states patients plumbing is not working.  [name] Interior and spatial designer and has plumber informs palliative care team that he will be at patients home later today.  [name] states patient wants to remain at home and not return to the hospital. Patient confirms that she wants to stay at home.  Patient is bed bound and dependent for all ADL's.  Patient has stage 4 decubitus on sacral area and patient has unstageable wounds on heels bilaterally.  Wounds are covered with foam dressings.  [name] reports he has hired a caregiver to be in the home with patient daily. Patient is in the home by herself at night. Patient has indwelling foley that is draining straw-colored urine. Patient confused at times and talks to things that are not there. Patient past medical history includes but not limited to hypertension, carotid artery stenosis, hypothyroidism, Alzheimer's., stage 4 pressure ulcers, frequent UTIs, frequent falls, history of breast cancer, hyperlipidemia and anxiety. Patients denies having any cough but reports she feels breathless at times. Patient complains of  having a dry mouth,  Patient has water at bedside. Patient has suffered 2  recent hip fractures and right femur fracture. Patient has had multiple hospitalizations this year. SW contacted [place] with [facility] and [place] in agreement with hospice referral for patient.  RN called MD's office and left message requesting order for hospice evaluation. Nurse also called doctor Clifton James and Dr Clifton James and he is in agreement with hospice referral. Nurse reviewed patient's medications with son, patient taking Tramadol  as needed for pain however son states it does no more than Tylenol does for patient. Son informed how to reach palliative care and informed hospice referral being made and he was in agreement. Son encouraged to contact palliative care with questions or concerns.     HISTORY OF PRESENT ILLNESS:  Patient is a 85 year old female who returned home from [facility] last week.  Patient being referred to hospice for overall decline.    CODE STATUS: DNR  ADVANCED DIRECTIVES: Y MOST FORM: No PPS: 30%   PHYSICAL EXAM:   VITALS: Today's Vitals   02/28/19 1325  BP: (!) 154/82  Pulse: 81  Resp: 16  Temp: 98.4 F (36.9 C)  TempSrc: Temporal  PainSc: 3   PainLoc: Generalized    LUNGS: decreased breath sounds CARDIAC: Cor RRR  EXTREMITIES: none edema SKIN: Stage IV decub on sacral area, nonstageable wounds  on heels bilaterally.  NEURO: positive for gait problems, memory problems and weakness       Nilda Simmer, RN

## 2019-02-28 NOTE — Telephone Encounter (Signed)
She has palliative care order in hospice she was not a candidate not sure why they keep calling for hospice referral palliative care referral was placed 02/23/19  Did they go to her house today?    [place]

## 2019-02-28 NOTE — Telephone Encounter (Signed)
Virgie, caregiver is asking for a order for hospice evaluation. Call [phone], caregiver phone number.

## 2019-03-02 ENCOUNTER — Telehealth: Payer: Self-pay

## 2019-03-02 ENCOUNTER — Other Ambulatory Visit: Payer: Self-pay | Admitting: *Deleted

## 2019-03-02 DIAGNOSIS — E039 Hypothyroidism, unspecified: Secondary | ICD-10-CM

## 2019-03-02 DIAGNOSIS — M4800 Spinal stenosis, site unspecified: Secondary | ICD-10-CM

## 2019-03-02 MED ORDER — LEVOTHYROXINE SODIUM 50 MCG PO TABS: 50.0000 ug | ORAL_TABLET | Freq: Every day | ORAL | 3 refills | Status: AC

## 2019-03-02 MED ORDER — POLYETHYLENE GLYCOL 3350 17 GM/SCOOP PO POWD: 17.0000 g | Freq: Every day | ORAL | 3 refills | Status: AC | PRN

## 2019-03-02 MED ORDER — GABAPENTIN 100 MG PO CAPS: ORAL_CAPSULE | ORAL | 3 refills | Status: AC

## 2019-03-02 MED ORDER — LOSARTAN POTASSIUM 100 MG PO TABS: 100.0000 mg | ORAL_TABLET | Freq: Every day | ORAL | 2 refills | Status: AC

## 2019-03-02 MED ORDER — DOCUSATE SODIUM 100 MG PO CAPS: 100.0000 mg | ORAL_CAPSULE | Freq: Two times a day (BID) | ORAL | 0 refills | Status: AC | PRN

## 2019-03-02 NOTE — Telephone Encounter (Signed)
Patients nurse called [name]. She stated that patient and caregiver is wanting to change the tramadol due to it not working. [name] wants a call back [phone]. Please advise.

## 2019-03-02 NOTE — Telephone Encounter (Signed)
Call [place] care palliative care  I am asking the palliative care/hospice of [name] the doctor to manage her narcotics  Inform [name]  [facility]

## 2019-03-02 NOTE — Patient Outreach (Signed)
[facility]) Care Management  03/02/2019  [name] 01-19-1927 [id number]   Subjective: Patient has diagnosis of dementia per chart review.  Telephone call to patient's son / designated party release, home  number, no answer, no voicemail set up, and unable to leave message.    Objective: Per KPN (Knowledge Performance Now, point of care tool) and chart review, patient hospitalized 02/02/2019 -02/09/2019 for UTI (urinary tract infection) due to urinary indwelling Foley catheter. Patient at  [facility] ([place]) for rehab, dates of service 02/09/2019 - 02/23/2019.  Patient hospitalized 12/12/2018 - 12/15/2018 for Closed nondisplaced intertrochanteric fracture of right femur, status post  Reduction and internal fixation of displaced intertrochanteric righthip fracture with Biomet Affixis on 12/13/2018.  Patient at [facility] ([place]) for rehab, dates of service Patient also has a history of hypertension, Hypothyroidism, Falls frequently, Hospital admission due to social situation, Stage IV pressure ulcer of sacral region, Dementia with behavioral disturbance, diabetes, hyperlipidemia, recent bilateral hip fractures requiring surgery, urinary retention requiring Foley catheter, anemia, dementia, breast and uterine cancer.        Assessment: Received Medicare [place] Management Post Acute Care Coordinator referral on 02/23/2019.  Referral source: Marthenia Rolling.   Referral reason: Please assign to [place] for complex case management and [facility] LCSW for complex social issues. To dc from [facility] today 02/23/19. Adamant about returning home. Has capacity. APS has been involved but likely to close case this week. Has run out of her 100 SNF days. Multiple co morbidities and falls. Was not hospice eligible in the recent past. Palliative referral made. Will have home health services.  Transition of care follow up pending patient/  patient 's  designated party release contact.      Plan: RNCM has sent unsuccessful outreach letter, [facility] pamphlet, will call patient/ patient's designated party release for 3rd telephone outreach attempt within 4 business days, transition of care follow up, and proceed with case closure, within 10 business days if no return call.     Feras Gardella H. Annia Friendly, BSN, [place] Management [facility] Telephonic CM Phone: [phone] Fax: [phone]

## 2019-03-03 ENCOUNTER — Ambulatory Visit: Payer: Self-pay | Admitting: *Deleted

## 2019-03-03 NOTE — Telephone Encounter (Signed)
Patient caregiver has been informed.

## 2019-03-09 ENCOUNTER — Other Ambulatory Visit: Payer: Self-pay | Admitting: *Deleted

## 2019-03-09 NOTE — Patient Outreach (Signed)
[facility]) Care Management  03/09/2019  HALLI NEIGHBOURS [birth year] [id number]   Subjective:    Patient has diagnosis of dementia and is being referred to hospice due to decline, per chart review. Telephone call to patient's son / designated party release, home  number [phone]) times 4, no answer, message states welcome to [employer], unable to complete number as dialed, and unable to leave a message. Telephone call to patient's home number, no answer,message states to enter remote access code, and unable to leave message.      Objective:Per KPN (Knowledge Performance Now, point of care tool) and chart review,patient hospitalized 02/02/2019 -02/09/2019 forUTI (urinary tract infection) due to urinary indwelling Foley catheter. Patient at [facility] ([place]) for rehab, dates of service 02/09/2019 - 02/23/2019.  Patient hospitalized 12/12/2018 - 12/15/2018 forClosed nondisplaced intertrochanteric fracture of right femur, status postReduction and internal fixation of displaced intertrochanteric righthip fracture with Biomet Affixison 12/13/2018. Patient at [facility] ([place] [facility]) for rehab, dates of service Patient also has a history of hypertension, Hypothyroidism,Falls frequently,Hospital admission due to social situation,Stage IV pressure ulcer of sacral region,Dementia with behavioral disturbance, diabetes, hyperlipidemia,recent bilateral hip fractures requiring surgery,urinary retention requiring Foley catheter, anemia, dementia, breast and uterine cancer.    Assessment: Received Medicare [place] Management Post Acute Care Coordinator referral on 02/23/2019. Referral source: Marthenia Rolling. Referral reason: Please assign to [place] for complex case management and [facility] LCSW for complex social issues. To dc from [facility] today 02/23/19. Adamant about returning home. Has capacity. APS has been involved but  likely to close case this week. Has run out of her 100 SNF days. Multiple co morbidities and falls. Was not hospice eligible in the recent past. Palliative referral made. Will have home health services. Transition of care follow up pendingpatient/patient 's designated party releasecontact.     Plan:RNCM has sent unsuccessful outreach letter, [facility] pamphlet, will and proceed with case closure, within 10 business days if no return call.     Edana Aguado H. Annia Friendly, BSN, [place] Management [facility] Telephonic CM Phone: [phone] Fax: [phone]

## 2019-03-11 DIAGNOSIS — E43 Unspecified severe protein-calorie malnutrition: Secondary | ICD-10-CM | POA: Diagnosis not present

## 2019-03-11 DIAGNOSIS — M519 Unspecified thoracic, thoracolumbar and lumbosacral intervertebral disc disorder: Secondary | ICD-10-CM | POA: Diagnosis not present

## 2019-03-11 DIAGNOSIS — R42 Dizziness and giddiness: Secondary | ICD-10-CM | POA: Diagnosis not present

## 2019-03-11 DIAGNOSIS — Z466 Encounter for fitting and adjustment of urinary device: Secondary | ICD-10-CM | POA: Diagnosis not present

## 2019-03-11 DIAGNOSIS — F419 Anxiety disorder, unspecified: Secondary | ICD-10-CM | POA: Diagnosis not present

## 2019-03-11 DIAGNOSIS — D649 Anemia, unspecified: Secondary | ICD-10-CM | POA: Diagnosis not present

## 2019-03-11 DIAGNOSIS — E039 Hypothyroidism, unspecified: Secondary | ICD-10-CM | POA: Diagnosis not present

## 2019-03-11 DIAGNOSIS — Z9012 Acquired absence of left breast and nipple: Secondary | ICD-10-CM | POA: Diagnosis not present

## 2019-03-11 DIAGNOSIS — I6529 Occlusion and stenosis of unspecified carotid artery: Secondary | ICD-10-CM | POA: Diagnosis not present

## 2019-03-11 DIAGNOSIS — Z853 Personal history of malignant neoplasm of breast: Secondary | ICD-10-CM | POA: Diagnosis not present

## 2019-03-11 DIAGNOSIS — R339 Retention of urine, unspecified: Secondary | ICD-10-CM | POA: Diagnosis not present

## 2019-03-11 DIAGNOSIS — R627 Adult failure to thrive: Secondary | ICD-10-CM | POA: Diagnosis not present

## 2019-03-11 DIAGNOSIS — L89611 Pressure ulcer of right heel, stage 1: Secondary | ICD-10-CM | POA: Diagnosis not present

## 2019-03-11 DIAGNOSIS — E785 Hyperlipidemia, unspecified: Secondary | ICD-10-CM | POA: Diagnosis not present

## 2019-03-11 DIAGNOSIS — L89620 Pressure ulcer of left heel, unstageable: Secondary | ICD-10-CM | POA: Diagnosis not present

## 2019-03-11 DIAGNOSIS — Z87440 Personal history of urinary (tract) infections: Secondary | ICD-10-CM | POA: Diagnosis not present

## 2019-03-11 DIAGNOSIS — I69818 Other symptoms and signs involving cognitive functions following other cerebrovascular disease: Secondary | ICD-10-CM | POA: Diagnosis not present

## 2019-03-11 DIAGNOSIS — L89154 Pressure ulcer of sacral region, stage 4: Secondary | ICD-10-CM | POA: Diagnosis not present

## 2019-03-11 DIAGNOSIS — M80051D Age-related osteoporosis with current pathological fracture, right femur, subsequent encounter for fracture with routine healing: Secondary | ICD-10-CM | POA: Diagnosis not present

## 2019-03-11 DIAGNOSIS — K219 Gastro-esophageal reflux disease without esophagitis: Secondary | ICD-10-CM | POA: Diagnosis not present

## 2019-03-11 DIAGNOSIS — M80052D Age-related osteoporosis with current pathological fracture, left femur, subsequent encounter for fracture with routine healing: Secondary | ICD-10-CM | POA: Diagnosis not present

## 2019-03-11 DIAGNOSIS — I10 Essential (primary) hypertension: Secondary | ICD-10-CM | POA: Diagnosis not present

## 2019-03-11 DIAGNOSIS — E1142 Type 2 diabetes mellitus with diabetic polyneuropathy: Secondary | ICD-10-CM | POA: Diagnosis not present

## 2019-03-11 DIAGNOSIS — F0150 Vascular dementia without behavioral disturbance: Secondary | ICD-10-CM | POA: Diagnosis not present

## 2019-03-11 DIAGNOSIS — R634 Abnormal weight loss: Secondary | ICD-10-CM | POA: Diagnosis not present

## 2019-03-12 DIAGNOSIS — D649 Anemia, unspecified: Secondary | ICD-10-CM | POA: Diagnosis not present

## 2019-03-12 DIAGNOSIS — I69818 Other symptoms and signs involving cognitive functions following other cerebrovascular disease: Secondary | ICD-10-CM | POA: Diagnosis not present

## 2019-03-12 DIAGNOSIS — I10 Essential (primary) hypertension: Secondary | ICD-10-CM | POA: Diagnosis not present

## 2019-03-12 DIAGNOSIS — I6529 Occlusion and stenosis of unspecified carotid artery: Secondary | ICD-10-CM | POA: Diagnosis not present

## 2019-03-12 DIAGNOSIS — F0150 Vascular dementia without behavioral disturbance: Secondary | ICD-10-CM | POA: Diagnosis not present

## 2019-03-12 DIAGNOSIS — E785 Hyperlipidemia, unspecified: Secondary | ICD-10-CM | POA: Diagnosis not present

## 2019-03-13 DIAGNOSIS — D649 Anemia, unspecified: Secondary | ICD-10-CM | POA: Diagnosis not present

## 2019-03-13 DIAGNOSIS — I10 Essential (primary) hypertension: Secondary | ICD-10-CM | POA: Diagnosis not present

## 2019-03-13 DIAGNOSIS — E785 Hyperlipidemia, unspecified: Secondary | ICD-10-CM | POA: Diagnosis not present

## 2019-03-13 DIAGNOSIS — I69818 Other symptoms and signs involving cognitive functions following other cerebrovascular disease: Secondary | ICD-10-CM | POA: Diagnosis not present

## 2019-03-13 DIAGNOSIS — I6529 Occlusion and stenosis of unspecified carotid artery: Secondary | ICD-10-CM | POA: Diagnosis not present

## 2019-03-13 DIAGNOSIS — F0150 Vascular dementia without behavioral disturbance: Secondary | ICD-10-CM | POA: Diagnosis not present

## 2019-03-14 ENCOUNTER — Other Ambulatory Visit: Payer: Self-pay | Admitting: *Deleted

## 2019-03-14 DIAGNOSIS — F0150 Vascular dementia without behavioral disturbance: Secondary | ICD-10-CM | POA: Diagnosis not present

## 2019-03-14 DIAGNOSIS — I6529 Occlusion and stenosis of unspecified carotid artery: Secondary | ICD-10-CM | POA: Diagnosis not present

## 2019-03-14 DIAGNOSIS — I69818 Other symptoms and signs involving cognitive functions following other cerebrovascular disease: Secondary | ICD-10-CM | POA: Diagnosis not present

## 2019-03-14 DIAGNOSIS — I10 Essential (primary) hypertension: Secondary | ICD-10-CM | POA: Diagnosis not present

## 2019-03-14 DIAGNOSIS — E785 Hyperlipidemia, unspecified: Secondary | ICD-10-CM | POA: Diagnosis not present

## 2019-03-14 DIAGNOSIS — D649 Anemia, unspecified: Secondary | ICD-10-CM | POA: Diagnosis not present

## 2019-03-14 NOTE — Patient Outreach (Signed)
[facility]) Care Management  03/14/2019  [name] 08-19-1926 [id number]   CSW attempted to reach patient but was unsuccessful, phone kept ringing and unable to leave a voicemail. CSW noted that [facility] RNCM, Darlene had left several attempts to reach patient/family as well as sent an unsuccessful outreach letter. CSW called [employer] office and spoke to [place] there to confirm that patient was transitioned from their palliative to their hospice services on 03/02/2019. CSW will close case at this time.    Stacy Opitz, LCSW [facility]  Clinical Social Worker cell #: [phone]

## 2019-03-15 ENCOUNTER — Other Ambulatory Visit: Payer: Self-pay | Admitting: *Deleted

## 2019-03-15 DIAGNOSIS — I10 Essential (primary) hypertension: Secondary | ICD-10-CM | POA: Diagnosis not present

## 2019-03-15 DIAGNOSIS — F0150 Vascular dementia without behavioral disturbance: Secondary | ICD-10-CM | POA: Diagnosis not present

## 2019-03-15 DIAGNOSIS — E785 Hyperlipidemia, unspecified: Secondary | ICD-10-CM | POA: Diagnosis not present

## 2019-03-15 DIAGNOSIS — D649 Anemia, unspecified: Secondary | ICD-10-CM | POA: Diagnosis not present

## 2019-03-15 DIAGNOSIS — I69818 Other symptoms and signs involving cognitive functions following other cerebrovascular disease: Secondary | ICD-10-CM | POA: Diagnosis not present

## 2019-03-15 DIAGNOSIS — I6529 Occlusion and stenosis of unspecified carotid artery: Secondary | ICD-10-CM | POA: Diagnosis not present

## 2019-03-15 NOTE — Patient Outreach (Signed)
[facility]) Care Management  03/15/2019  CRISTENE RECKERS [birth year] [id number]   Per [place] Management Social Worker Raynaldo Opitz) patient has been transitioned to hospice.  Case will remain closed.    Kateryn Marasigan H. Annia Friendly, BSN, [place] Management [facility] Telephonic CM Phone: [phone] Fax: [phone]

## 2019-03-16 ENCOUNTER — Telehealth: Payer: Self-pay | Admitting: Internal Medicine

## 2019-03-16 DIAGNOSIS — E785 Hyperlipidemia, unspecified: Secondary | ICD-10-CM | POA: Diagnosis not present

## 2019-03-16 DIAGNOSIS — F0150 Vascular dementia without behavioral disturbance: Secondary | ICD-10-CM | POA: Diagnosis not present

## 2019-03-16 DIAGNOSIS — D649 Anemia, unspecified: Secondary | ICD-10-CM | POA: Diagnosis not present

## 2019-03-16 DIAGNOSIS — I69818 Other symptoms and signs involving cognitive functions following other cerebrovascular disease: Secondary | ICD-10-CM | POA: Diagnosis not present

## 2019-03-16 DIAGNOSIS — I6529 Occlusion and stenosis of unspecified carotid artery: Secondary | ICD-10-CM | POA: Diagnosis not present

## 2019-03-16 DIAGNOSIS — I10 Essential (primary) hypertension: Secondary | ICD-10-CM | POA: Diagnosis not present

## 2019-03-16 NOTE — Telephone Encounter (Signed)
I called pt twice and was not able to leave a vm.

## 2019-03-17 ENCOUNTER — Ambulatory Visit: Payer: Medicare Other | Admitting: Internal Medicine

## 2019-03-17 DIAGNOSIS — I6529 Occlusion and stenosis of unspecified carotid artery: Secondary | ICD-10-CM | POA: Diagnosis not present

## 2019-03-17 DIAGNOSIS — F0150 Vascular dementia without behavioral disturbance: Secondary | ICD-10-CM | POA: Diagnosis not present

## 2019-03-17 DIAGNOSIS — I69818 Other symptoms and signs involving cognitive functions following other cerebrovascular disease: Secondary | ICD-10-CM | POA: Diagnosis not present

## 2019-03-17 DIAGNOSIS — E785 Hyperlipidemia, unspecified: Secondary | ICD-10-CM | POA: Diagnosis not present

## 2019-03-17 DIAGNOSIS — D649 Anemia, unspecified: Secondary | ICD-10-CM | POA: Diagnosis not present

## 2019-03-17 DIAGNOSIS — I10 Essential (primary) hypertension: Secondary | ICD-10-CM | POA: Diagnosis not present

## 2019-03-18 DIAGNOSIS — I10 Essential (primary) hypertension: Secondary | ICD-10-CM | POA: Diagnosis not present

## 2019-03-18 DIAGNOSIS — I6529 Occlusion and stenosis of unspecified carotid artery: Secondary | ICD-10-CM | POA: Diagnosis not present

## 2019-03-18 DIAGNOSIS — F0150 Vascular dementia without behavioral disturbance: Secondary | ICD-10-CM | POA: Diagnosis not present

## 2019-03-18 DIAGNOSIS — D649 Anemia, unspecified: Secondary | ICD-10-CM | POA: Diagnosis not present

## 2019-03-18 DIAGNOSIS — E785 Hyperlipidemia, unspecified: Secondary | ICD-10-CM | POA: Diagnosis not present

## 2019-03-18 DIAGNOSIS — I69818 Other symptoms and signs involving cognitive functions following other cerebrovascular disease: Secondary | ICD-10-CM | POA: Diagnosis not present

## 2019-03-19 DIAGNOSIS — D649 Anemia, unspecified: Secondary | ICD-10-CM | POA: Diagnosis not present

## 2019-03-19 DIAGNOSIS — I6529 Occlusion and stenosis of unspecified carotid artery: Secondary | ICD-10-CM | POA: Diagnosis not present

## 2019-03-19 DIAGNOSIS — F0150 Vascular dementia without behavioral disturbance: Secondary | ICD-10-CM | POA: Diagnosis not present

## 2019-03-19 DIAGNOSIS — I10 Essential (primary) hypertension: Secondary | ICD-10-CM | POA: Diagnosis not present

## 2019-03-19 DIAGNOSIS — I69818 Other symptoms and signs involving cognitive functions following other cerebrovascular disease: Secondary | ICD-10-CM | POA: Diagnosis not present

## 2019-03-19 DIAGNOSIS — E785 Hyperlipidemia, unspecified: Secondary | ICD-10-CM | POA: Diagnosis not present

## 2019-03-20 DIAGNOSIS — F0150 Vascular dementia without behavioral disturbance: Secondary | ICD-10-CM | POA: Diagnosis not present

## 2019-03-20 DIAGNOSIS — E785 Hyperlipidemia, unspecified: Secondary | ICD-10-CM | POA: Diagnosis not present

## 2019-03-20 DIAGNOSIS — I69818 Other symptoms and signs involving cognitive functions following other cerebrovascular disease: Secondary | ICD-10-CM | POA: Diagnosis not present

## 2019-03-20 DIAGNOSIS — I6529 Occlusion and stenosis of unspecified carotid artery: Secondary | ICD-10-CM | POA: Diagnosis not present

## 2019-03-20 DIAGNOSIS — I10 Essential (primary) hypertension: Secondary | ICD-10-CM | POA: Diagnosis not present

## 2019-03-20 DIAGNOSIS — D649 Anemia, unspecified: Secondary | ICD-10-CM | POA: Diagnosis not present

## 2019-03-21 DIAGNOSIS — E785 Hyperlipidemia, unspecified: Secondary | ICD-10-CM | POA: Diagnosis not present

## 2019-03-21 DIAGNOSIS — F0150 Vascular dementia without behavioral disturbance: Secondary | ICD-10-CM | POA: Diagnosis not present

## 2019-03-21 DIAGNOSIS — D649 Anemia, unspecified: Secondary | ICD-10-CM | POA: Diagnosis not present

## 2019-03-21 DIAGNOSIS — I69818 Other symptoms and signs involving cognitive functions following other cerebrovascular disease: Secondary | ICD-10-CM | POA: Diagnosis not present

## 2019-03-21 DIAGNOSIS — I10 Essential (primary) hypertension: Secondary | ICD-10-CM | POA: Diagnosis not present

## 2019-03-21 DIAGNOSIS — I6529 Occlusion and stenosis of unspecified carotid artery: Secondary | ICD-10-CM | POA: Diagnosis not present

## 2019-03-22 DIAGNOSIS — I69818 Other symptoms and signs involving cognitive functions following other cerebrovascular disease: Secondary | ICD-10-CM | POA: Diagnosis not present

## 2019-03-22 DIAGNOSIS — I10 Essential (primary) hypertension: Secondary | ICD-10-CM | POA: Diagnosis not present

## 2019-03-22 DIAGNOSIS — F0150 Vascular dementia without behavioral disturbance: Secondary | ICD-10-CM | POA: Diagnosis not present

## 2019-03-22 DIAGNOSIS — E785 Hyperlipidemia, unspecified: Secondary | ICD-10-CM | POA: Diagnosis not present

## 2019-03-22 DIAGNOSIS — I6529 Occlusion and stenosis of unspecified carotid artery: Secondary | ICD-10-CM | POA: Diagnosis not present

## 2019-03-22 DIAGNOSIS — D649 Anemia, unspecified: Secondary | ICD-10-CM | POA: Diagnosis not present

## 2019-03-23 DIAGNOSIS — E785 Hyperlipidemia, unspecified: Secondary | ICD-10-CM | POA: Diagnosis not present

## 2019-03-23 DIAGNOSIS — I69818 Other symptoms and signs involving cognitive functions following other cerebrovascular disease: Secondary | ICD-10-CM | POA: Diagnosis not present

## 2019-03-23 DIAGNOSIS — D649 Anemia, unspecified: Secondary | ICD-10-CM | POA: Diagnosis not present

## 2019-03-23 DIAGNOSIS — F0150 Vascular dementia without behavioral disturbance: Secondary | ICD-10-CM | POA: Diagnosis not present

## 2019-03-23 DIAGNOSIS — I10 Essential (primary) hypertension: Secondary | ICD-10-CM | POA: Diagnosis not present

## 2019-03-23 DIAGNOSIS — I6529 Occlusion and stenosis of unspecified carotid artery: Secondary | ICD-10-CM | POA: Diagnosis not present

## 2019-03-24 DIAGNOSIS — I69818 Other symptoms and signs involving cognitive functions following other cerebrovascular disease: Secondary | ICD-10-CM | POA: Diagnosis not present

## 2019-03-24 DIAGNOSIS — F0150 Vascular dementia without behavioral disturbance: Secondary | ICD-10-CM | POA: Diagnosis not present

## 2019-03-24 DIAGNOSIS — D649 Anemia, unspecified: Secondary | ICD-10-CM | POA: Diagnosis not present

## 2019-03-24 DIAGNOSIS — I6529 Occlusion and stenosis of unspecified carotid artery: Secondary | ICD-10-CM | POA: Diagnosis not present

## 2019-03-24 DIAGNOSIS — I10 Essential (primary) hypertension: Secondary | ICD-10-CM | POA: Diagnosis not present

## 2019-03-24 DIAGNOSIS — E785 Hyperlipidemia, unspecified: Secondary | ICD-10-CM | POA: Diagnosis not present

## 2019-03-25 DIAGNOSIS — I69818 Other symptoms and signs involving cognitive functions following other cerebrovascular disease: Secondary | ICD-10-CM | POA: Diagnosis not present

## 2019-03-25 DIAGNOSIS — D649 Anemia, unspecified: Secondary | ICD-10-CM | POA: Diagnosis not present

## 2019-03-25 DIAGNOSIS — I10 Essential (primary) hypertension: Secondary | ICD-10-CM | POA: Diagnosis not present

## 2019-03-25 DIAGNOSIS — F0150 Vascular dementia without behavioral disturbance: Secondary | ICD-10-CM | POA: Diagnosis not present

## 2019-03-25 DIAGNOSIS — I6529 Occlusion and stenosis of unspecified carotid artery: Secondary | ICD-10-CM | POA: Diagnosis not present

## 2019-03-25 DIAGNOSIS — E785 Hyperlipidemia, unspecified: Secondary | ICD-10-CM | POA: Diagnosis not present

## 2019-03-26 DIAGNOSIS — I10 Essential (primary) hypertension: Secondary | ICD-10-CM | POA: Diagnosis not present

## 2019-03-26 DIAGNOSIS — E785 Hyperlipidemia, unspecified: Secondary | ICD-10-CM | POA: Diagnosis not present

## 2019-03-26 DIAGNOSIS — D649 Anemia, unspecified: Secondary | ICD-10-CM | POA: Diagnosis not present

## 2019-03-26 DIAGNOSIS — I6529 Occlusion and stenosis of unspecified carotid artery: Secondary | ICD-10-CM | POA: Diagnosis not present

## 2019-03-26 DIAGNOSIS — I69818 Other symptoms and signs involving cognitive functions following other cerebrovascular disease: Secondary | ICD-10-CM | POA: Diagnosis not present

## 2019-03-26 DIAGNOSIS — F0150 Vascular dementia without behavioral disturbance: Secondary | ICD-10-CM | POA: Diagnosis not present

## 2019-03-27 DIAGNOSIS — D649 Anemia, unspecified: Secondary | ICD-10-CM | POA: Diagnosis not present

## 2019-03-27 DIAGNOSIS — I6529 Occlusion and stenosis of unspecified carotid artery: Secondary | ICD-10-CM | POA: Diagnosis not present

## 2019-03-27 DIAGNOSIS — I69818 Other symptoms and signs involving cognitive functions following other cerebrovascular disease: Secondary | ICD-10-CM | POA: Diagnosis not present

## 2019-03-27 DIAGNOSIS — I10 Essential (primary) hypertension: Secondary | ICD-10-CM | POA: Diagnosis not present

## 2019-03-27 DIAGNOSIS — E785 Hyperlipidemia, unspecified: Secondary | ICD-10-CM | POA: Diagnosis not present

## 2019-03-27 DIAGNOSIS — F0150 Vascular dementia without behavioral disturbance: Secondary | ICD-10-CM | POA: Diagnosis not present

## 2019-03-28 DIAGNOSIS — I10 Essential (primary) hypertension: Secondary | ICD-10-CM | POA: Diagnosis not present

## 2019-03-28 DIAGNOSIS — E785 Hyperlipidemia, unspecified: Secondary | ICD-10-CM | POA: Diagnosis not present

## 2019-03-28 DIAGNOSIS — D649 Anemia, unspecified: Secondary | ICD-10-CM | POA: Diagnosis not present

## 2019-03-28 DIAGNOSIS — F0150 Vascular dementia without behavioral disturbance: Secondary | ICD-10-CM | POA: Diagnosis not present

## 2019-03-28 DIAGNOSIS — I69818 Other symptoms and signs involving cognitive functions following other cerebrovascular disease: Secondary | ICD-10-CM | POA: Diagnosis not present

## 2019-03-28 DIAGNOSIS — I6529 Occlusion and stenosis of unspecified carotid artery: Secondary | ICD-10-CM | POA: Diagnosis not present

## 2019-03-29 DIAGNOSIS — E785 Hyperlipidemia, unspecified: Secondary | ICD-10-CM | POA: Diagnosis not present

## 2019-03-29 DIAGNOSIS — D649 Anemia, unspecified: Secondary | ICD-10-CM | POA: Diagnosis not present

## 2019-03-29 DIAGNOSIS — I69818 Other symptoms and signs involving cognitive functions following other cerebrovascular disease: Secondary | ICD-10-CM | POA: Diagnosis not present

## 2019-03-29 DIAGNOSIS — I10 Essential (primary) hypertension: Secondary | ICD-10-CM | POA: Diagnosis not present

## 2019-03-29 DIAGNOSIS — I6529 Occlusion and stenosis of unspecified carotid artery: Secondary | ICD-10-CM | POA: Diagnosis not present

## 2019-03-29 DIAGNOSIS — F0150 Vascular dementia without behavioral disturbance: Secondary | ICD-10-CM | POA: Diagnosis not present

## 2019-04-01 DIAGNOSIS — I10 Essential (primary) hypertension: Secondary | ICD-10-CM | POA: Diagnosis not present

## 2019-04-01 DIAGNOSIS — I69818 Other symptoms and signs involving cognitive functions following other cerebrovascular disease: Secondary | ICD-10-CM | POA: Diagnosis not present

## 2019-04-01 DIAGNOSIS — I6529 Occlusion and stenosis of unspecified carotid artery: Secondary | ICD-10-CM | POA: Diagnosis not present

## 2019-04-01 DIAGNOSIS — E785 Hyperlipidemia, unspecified: Secondary | ICD-10-CM | POA: Diagnosis not present

## 2019-04-01 DIAGNOSIS — D649 Anemia, unspecified: Secondary | ICD-10-CM | POA: Diagnosis not present

## 2019-04-01 DIAGNOSIS — F0150 Vascular dementia without behavioral disturbance: Secondary | ICD-10-CM | POA: Diagnosis not present

## 2019-04-02 DIAGNOSIS — I6529 Occlusion and stenosis of unspecified carotid artery: Secondary | ICD-10-CM | POA: Diagnosis not present

## 2019-04-02 DIAGNOSIS — F0150 Vascular dementia without behavioral disturbance: Secondary | ICD-10-CM | POA: Diagnosis not present

## 2019-04-02 DIAGNOSIS — I10 Essential (primary) hypertension: Secondary | ICD-10-CM | POA: Diagnosis not present

## 2019-04-02 DIAGNOSIS — I69818 Other symptoms and signs involving cognitive functions following other cerebrovascular disease: Secondary | ICD-10-CM | POA: Diagnosis not present

## 2019-04-02 DIAGNOSIS — D649 Anemia, unspecified: Secondary | ICD-10-CM | POA: Diagnosis not present

## 2019-04-02 DIAGNOSIS — E785 Hyperlipidemia, unspecified: Secondary | ICD-10-CM | POA: Diagnosis not present

## 2019-04-04 DIAGNOSIS — I10 Essential (primary) hypertension: Secondary | ICD-10-CM | POA: Diagnosis not present

## 2019-04-04 DIAGNOSIS — E785 Hyperlipidemia, unspecified: Secondary | ICD-10-CM | POA: Diagnosis not present

## 2019-04-04 DIAGNOSIS — I69818 Other symptoms and signs involving cognitive functions following other cerebrovascular disease: Secondary | ICD-10-CM | POA: Diagnosis not present

## 2019-04-04 DIAGNOSIS — F0150 Vascular dementia without behavioral disturbance: Secondary | ICD-10-CM | POA: Diagnosis not present

## 2019-04-04 DIAGNOSIS — D649 Anemia, unspecified: Secondary | ICD-10-CM | POA: Diagnosis not present

## 2019-04-04 DIAGNOSIS — I6529 Occlusion and stenosis of unspecified carotid artery: Secondary | ICD-10-CM | POA: Diagnosis not present

## 2019-04-05 DIAGNOSIS — D649 Anemia, unspecified: Secondary | ICD-10-CM | POA: Diagnosis not present

## 2019-04-05 DIAGNOSIS — I10 Essential (primary) hypertension: Secondary | ICD-10-CM | POA: Diagnosis not present

## 2019-04-05 DIAGNOSIS — E785 Hyperlipidemia, unspecified: Secondary | ICD-10-CM | POA: Diagnosis not present

## 2019-04-05 DIAGNOSIS — F0150 Vascular dementia without behavioral disturbance: Secondary | ICD-10-CM | POA: Diagnosis not present

## 2019-04-05 DIAGNOSIS — I6529 Occlusion and stenosis of unspecified carotid artery: Secondary | ICD-10-CM | POA: Diagnosis not present

## 2019-04-05 DIAGNOSIS — I69818 Other symptoms and signs involving cognitive functions following other cerebrovascular disease: Secondary | ICD-10-CM | POA: Diagnosis not present

## 2019-04-06 DIAGNOSIS — I6529 Occlusion and stenosis of unspecified carotid artery: Secondary | ICD-10-CM | POA: Diagnosis not present

## 2019-04-06 DIAGNOSIS — I10 Essential (primary) hypertension: Secondary | ICD-10-CM | POA: Diagnosis not present

## 2019-04-06 DIAGNOSIS — F0150 Vascular dementia without behavioral disturbance: Secondary | ICD-10-CM | POA: Diagnosis not present

## 2019-04-06 DIAGNOSIS — I69818 Other symptoms and signs involving cognitive functions following other cerebrovascular disease: Secondary | ICD-10-CM | POA: Diagnosis not present

## 2019-04-06 DIAGNOSIS — E785 Hyperlipidemia, unspecified: Secondary | ICD-10-CM | POA: Diagnosis not present

## 2019-04-06 DIAGNOSIS — D649 Anemia, unspecified: Secondary | ICD-10-CM | POA: Diagnosis not present

## 2019-04-07 DIAGNOSIS — I6529 Occlusion and stenosis of unspecified carotid artery: Secondary | ICD-10-CM | POA: Diagnosis not present

## 2019-04-07 DIAGNOSIS — E785 Hyperlipidemia, unspecified: Secondary | ICD-10-CM | POA: Diagnosis not present

## 2019-04-07 DIAGNOSIS — F0150 Vascular dementia without behavioral disturbance: Secondary | ICD-10-CM | POA: Diagnosis not present

## 2019-04-07 DIAGNOSIS — I69818 Other symptoms and signs involving cognitive functions following other cerebrovascular disease: Secondary | ICD-10-CM | POA: Diagnosis not present

## 2019-04-07 DIAGNOSIS — I10 Essential (primary) hypertension: Secondary | ICD-10-CM | POA: Diagnosis not present

## 2019-04-07 DIAGNOSIS — D649 Anemia, unspecified: Secondary | ICD-10-CM | POA: Diagnosis not present

## 2019-04-08 DIAGNOSIS — I69818 Other symptoms and signs involving cognitive functions following other cerebrovascular disease: Secondary | ICD-10-CM | POA: Diagnosis not present

## 2019-04-08 DIAGNOSIS — D649 Anemia, unspecified: Secondary | ICD-10-CM | POA: Diagnosis not present

## 2019-04-08 DIAGNOSIS — F0150 Vascular dementia without behavioral disturbance: Secondary | ICD-10-CM | POA: Diagnosis not present

## 2019-04-08 DIAGNOSIS — I6529 Occlusion and stenosis of unspecified carotid artery: Secondary | ICD-10-CM | POA: Diagnosis not present

## 2019-04-08 DIAGNOSIS — I10 Essential (primary) hypertension: Secondary | ICD-10-CM | POA: Diagnosis not present

## 2019-04-08 DIAGNOSIS — E785 Hyperlipidemia, unspecified: Secondary | ICD-10-CM | POA: Diagnosis not present

## 2019-04-09 DIAGNOSIS — I10 Essential (primary) hypertension: Secondary | ICD-10-CM | POA: Diagnosis not present

## 2019-04-09 DIAGNOSIS — I69818 Other symptoms and signs involving cognitive functions following other cerebrovascular disease: Secondary | ICD-10-CM | POA: Diagnosis not present

## 2019-04-09 DIAGNOSIS — D649 Anemia, unspecified: Secondary | ICD-10-CM | POA: Diagnosis not present

## 2019-04-09 DIAGNOSIS — F0150 Vascular dementia without behavioral disturbance: Secondary | ICD-10-CM | POA: Diagnosis not present

## 2019-04-09 DIAGNOSIS — E785 Hyperlipidemia, unspecified: Secondary | ICD-10-CM | POA: Diagnosis not present

## 2019-04-09 DIAGNOSIS — I6529 Occlusion and stenosis of unspecified carotid artery: Secondary | ICD-10-CM | POA: Diagnosis not present

## 2019-04-10 DIAGNOSIS — I6529 Occlusion and stenosis of unspecified carotid artery: Secondary | ICD-10-CM | POA: Diagnosis not present

## 2019-04-10 DIAGNOSIS — I10 Essential (primary) hypertension: Secondary | ICD-10-CM | POA: Diagnosis not present

## 2019-04-10 DIAGNOSIS — F0150 Vascular dementia without behavioral disturbance: Secondary | ICD-10-CM | POA: Diagnosis not present

## 2019-04-10 DIAGNOSIS — E785 Hyperlipidemia, unspecified: Secondary | ICD-10-CM | POA: Diagnosis not present

## 2019-04-10 DIAGNOSIS — I69818 Other symptoms and signs involving cognitive functions following other cerebrovascular disease: Secondary | ICD-10-CM | POA: Diagnosis not present

## 2019-04-10 DIAGNOSIS — D649 Anemia, unspecified: Secondary | ICD-10-CM | POA: Diagnosis not present

## 2019-04-11 DIAGNOSIS — R42 Dizziness and giddiness: Secondary | ICD-10-CM | POA: Diagnosis not present

## 2019-04-11 DIAGNOSIS — M80051D Age-related osteoporosis with current pathological fracture, right femur, subsequent encounter for fracture with routine healing: Secondary | ICD-10-CM | POA: Diagnosis not present

## 2019-04-11 DIAGNOSIS — I69818 Other symptoms and signs involving cognitive functions following other cerebrovascular disease: Secondary | ICD-10-CM | POA: Diagnosis not present

## 2019-04-11 DIAGNOSIS — E039 Hypothyroidism, unspecified: Secondary | ICD-10-CM | POA: Diagnosis not present

## 2019-04-11 DIAGNOSIS — I6529 Occlusion and stenosis of unspecified carotid artery: Secondary | ICD-10-CM | POA: Diagnosis not present

## 2019-04-11 DIAGNOSIS — M80052D Age-related osteoporosis with current pathological fracture, left femur, subsequent encounter for fracture with routine healing: Secondary | ICD-10-CM | POA: Diagnosis not present

## 2019-04-11 DIAGNOSIS — K219 Gastro-esophageal reflux disease without esophagitis: Secondary | ICD-10-CM | POA: Diagnosis not present

## 2019-04-11 DIAGNOSIS — Z87440 Personal history of urinary (tract) infections: Secondary | ICD-10-CM | POA: Diagnosis not present

## 2019-04-11 DIAGNOSIS — Z466 Encounter for fitting and adjustment of urinary device: Secondary | ICD-10-CM | POA: Diagnosis not present

## 2019-04-11 DIAGNOSIS — R634 Abnormal weight loss: Secondary | ICD-10-CM | POA: Diagnosis not present

## 2019-04-11 DIAGNOSIS — E43 Unspecified severe protein-calorie malnutrition: Secondary | ICD-10-CM | POA: Diagnosis not present

## 2019-04-11 DIAGNOSIS — L89611 Pressure ulcer of right heel, stage 1: Secondary | ICD-10-CM | POA: Diagnosis not present

## 2019-04-11 DIAGNOSIS — E785 Hyperlipidemia, unspecified: Secondary | ICD-10-CM | POA: Diagnosis not present

## 2019-04-11 DIAGNOSIS — F0150 Vascular dementia without behavioral disturbance: Secondary | ICD-10-CM | POA: Diagnosis not present

## 2019-04-11 DIAGNOSIS — L89620 Pressure ulcer of left heel, unstageable: Secondary | ICD-10-CM | POA: Diagnosis not present

## 2019-04-11 DIAGNOSIS — R627 Adult failure to thrive: Secondary | ICD-10-CM | POA: Diagnosis not present

## 2019-04-11 DIAGNOSIS — R339 Retention of urine, unspecified: Secondary | ICD-10-CM | POA: Diagnosis not present

## 2019-04-11 DIAGNOSIS — D649 Anemia, unspecified: Secondary | ICD-10-CM | POA: Diagnosis not present

## 2019-04-11 DIAGNOSIS — L89154 Pressure ulcer of sacral region, stage 4: Secondary | ICD-10-CM | POA: Diagnosis not present

## 2019-04-11 DIAGNOSIS — M519 Unspecified thoracic, thoracolumbar and lumbosacral intervertebral disc disorder: Secondary | ICD-10-CM | POA: Diagnosis not present

## 2019-04-11 DIAGNOSIS — E1142 Type 2 diabetes mellitus with diabetic polyneuropathy: Secondary | ICD-10-CM | POA: Diagnosis not present

## 2019-04-11 DIAGNOSIS — Z853 Personal history of malignant neoplasm of breast: Secondary | ICD-10-CM | POA: Diagnosis not present

## 2019-04-11 DIAGNOSIS — F419 Anxiety disorder, unspecified: Secondary | ICD-10-CM | POA: Diagnosis not present

## 2019-04-11 DIAGNOSIS — I10 Essential (primary) hypertension: Secondary | ICD-10-CM | POA: Diagnosis not present

## 2019-04-11 DIAGNOSIS — Z9012 Acquired absence of left breast and nipple: Secondary | ICD-10-CM | POA: Diagnosis not present

## 2019-04-12 DIAGNOSIS — E785 Hyperlipidemia, unspecified: Secondary | ICD-10-CM | POA: Diagnosis not present

## 2019-04-12 DIAGNOSIS — I69818 Other symptoms and signs involving cognitive functions following other cerebrovascular disease: Secondary | ICD-10-CM | POA: Diagnosis not present

## 2019-04-12 DIAGNOSIS — D649 Anemia, unspecified: Secondary | ICD-10-CM | POA: Diagnosis not present

## 2019-04-12 DIAGNOSIS — F0150 Vascular dementia without behavioral disturbance: Secondary | ICD-10-CM | POA: Diagnosis not present

## 2019-04-12 DIAGNOSIS — I10 Essential (primary) hypertension: Secondary | ICD-10-CM | POA: Diagnosis not present

## 2019-04-12 DIAGNOSIS — I6529 Occlusion and stenosis of unspecified carotid artery: Secondary | ICD-10-CM | POA: Diagnosis not present

## 2019-04-13 ENCOUNTER — Telehealth: Payer: Self-pay | Admitting: Internal Medicine

## 2019-04-13 DIAGNOSIS — F0150 Vascular dementia without behavioral disturbance: Secondary | ICD-10-CM | POA: Diagnosis not present

## 2019-04-13 DIAGNOSIS — I69818 Other symptoms and signs involving cognitive functions following other cerebrovascular disease: Secondary | ICD-10-CM | POA: Diagnosis not present

## 2019-04-13 DIAGNOSIS — I10 Essential (primary) hypertension: Secondary | ICD-10-CM | POA: Diagnosis not present

## 2019-04-13 DIAGNOSIS — D649 Anemia, unspecified: Secondary | ICD-10-CM | POA: Diagnosis not present

## 2019-04-13 DIAGNOSIS — E785 Hyperlipidemia, unspecified: Secondary | ICD-10-CM | POA: Diagnosis not present

## 2019-04-13 DIAGNOSIS — I6529 Occlusion and stenosis of unspecified carotid artery: Secondary | ICD-10-CM | POA: Diagnosis not present

## 2019-04-13 NOTE — Telephone Encounter (Signed)
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 Please advise and Thank you!  This is time sensitive.  The financial part of POA states the pcp has to sign so son can have access to her finances.   FYi pt is near end of life.
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
## 2019-05-09 DEATH — deceased

## 2019-12-21 IMAGING — CT CT L SPINE W/O CM
3 of 4 series · 13 of 33 positions shown, 16 images · non-contrast
Comparison: Lumbar MRI 10/21/2011.

CLINICAL DATA: [AGE] female status post unwitnessed fall the
night before last. Low back pain. Femur fracture.

EXAM:
CT LUMBAR SPINE WITHOUT CONTRAST
TECHNIQUE: Multidetector CT imaging of the lumbar spine was performed without
intravenous contrast administration. Multiplanar CT image
reconstructions were also generated.

[Series 4: l spine soft · axial · 0.33mm/px · z∈[-210,-52]mm · 5 of 115 slices shown, 7 images]
[im 18/115  soft-tissue]
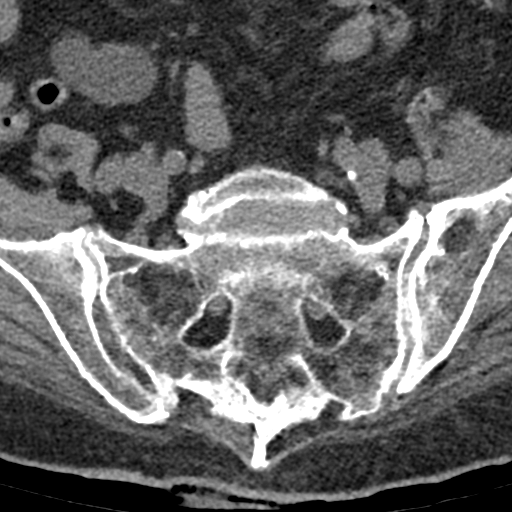
[im 18/115  bone]
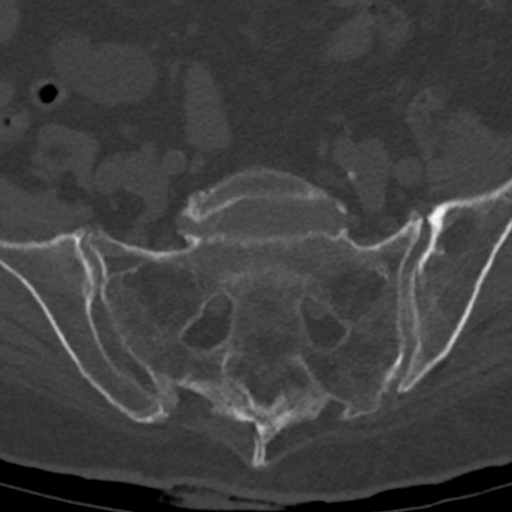
[im 36/115  bone]
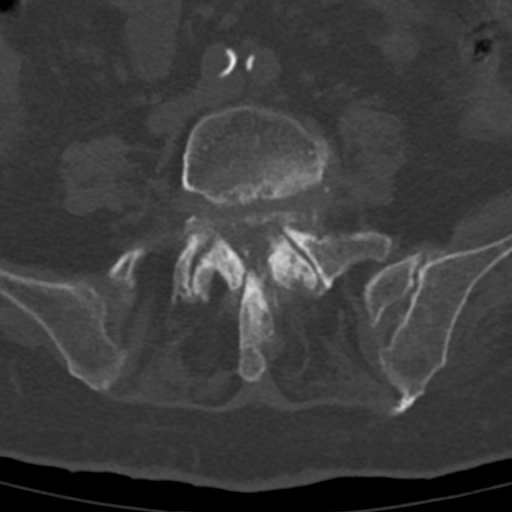
[im 62/115  bone]
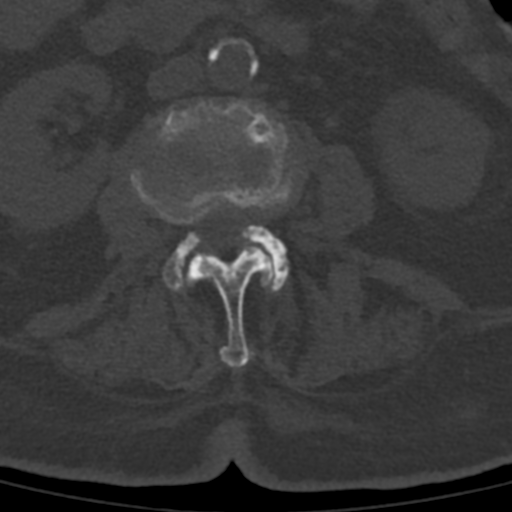
[im 79/115  bone]
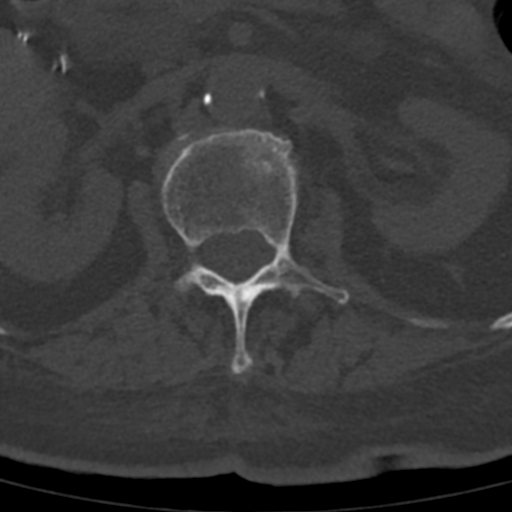
[im 97/115  soft-tissue]
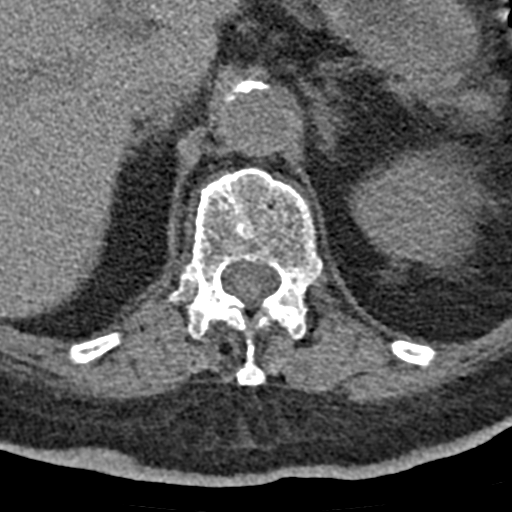
[im 97/115  bone]
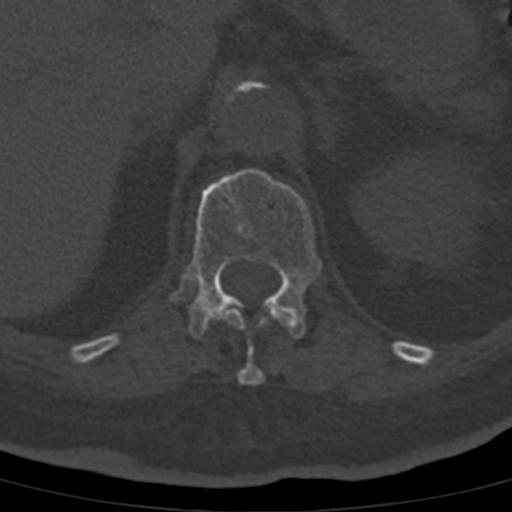

[Series 5: sagittal bone · sagittal · 0.33mm/px · 5 of 106 slices shown, 6 images]
[im 36/106  bone]
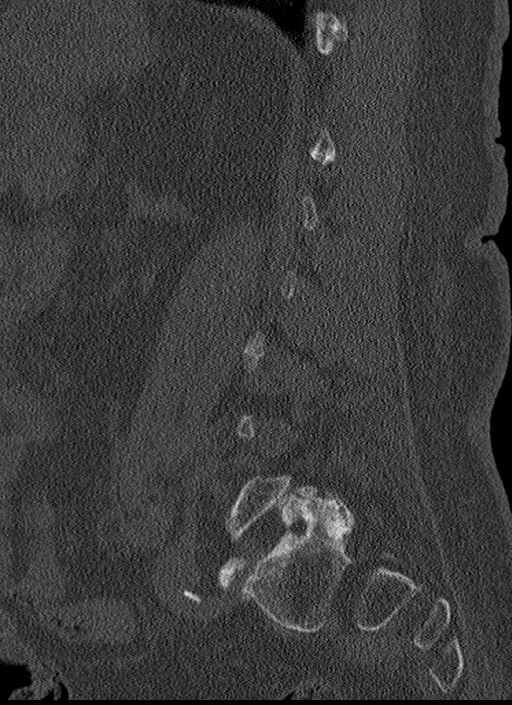
[im 44/106  bone]
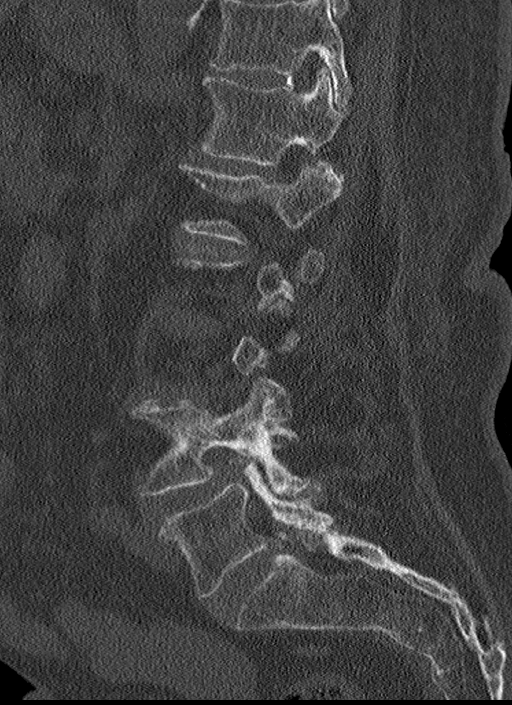
[im 53/106  soft-tissue]
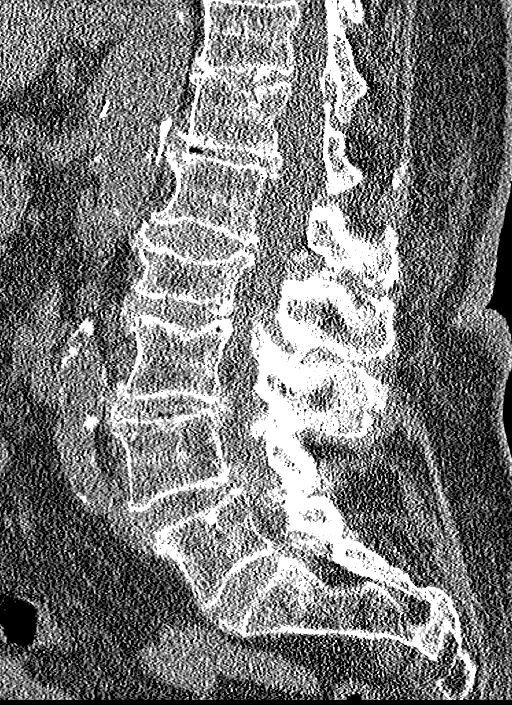
[im 53/106  bone]
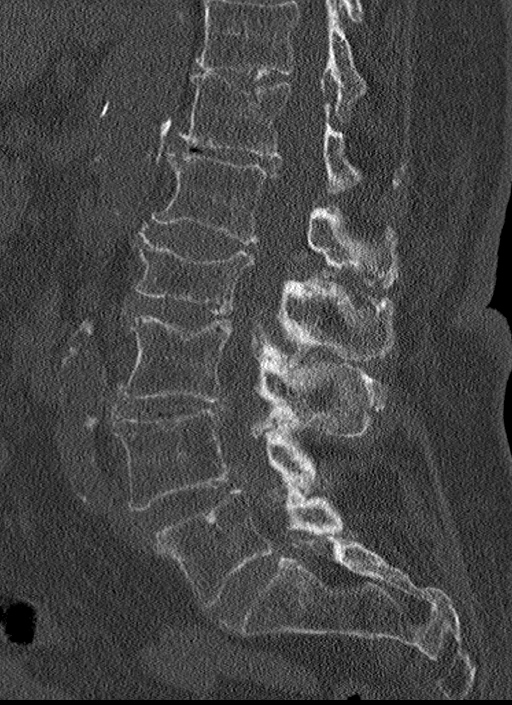
[im 62/106  bone]
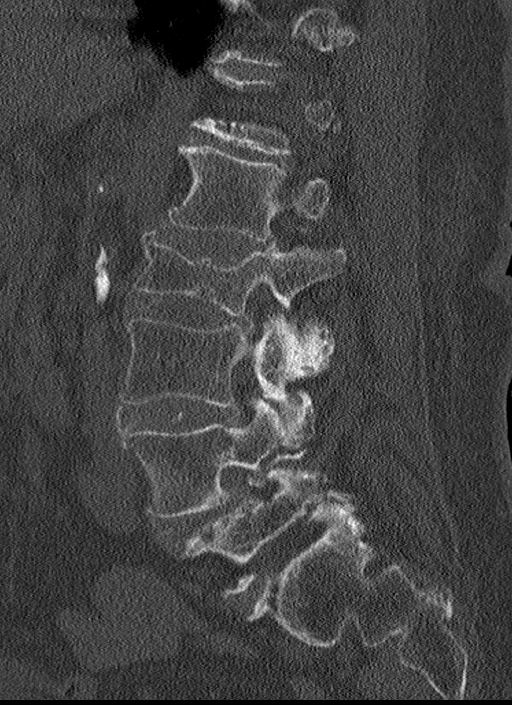
[im 71/106  bone]
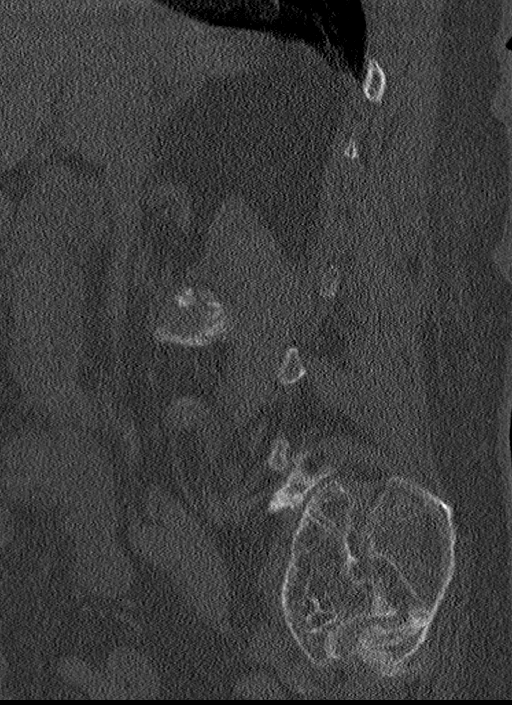

[Series 6: coronal bone · coronal · 0.42mm/px · 3 of 81 slices shown]
[im 21/81  bone]
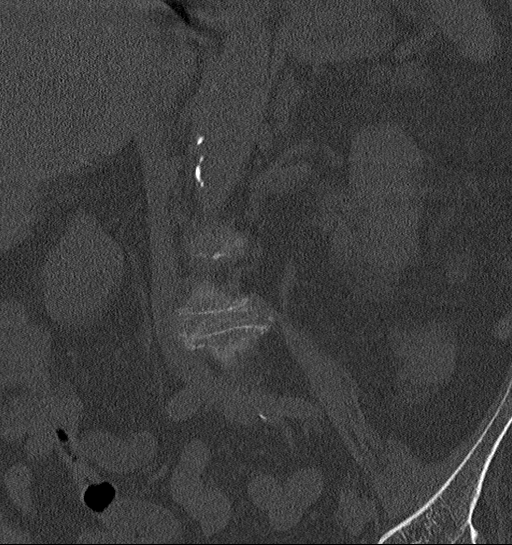
[im 41/81  bone]
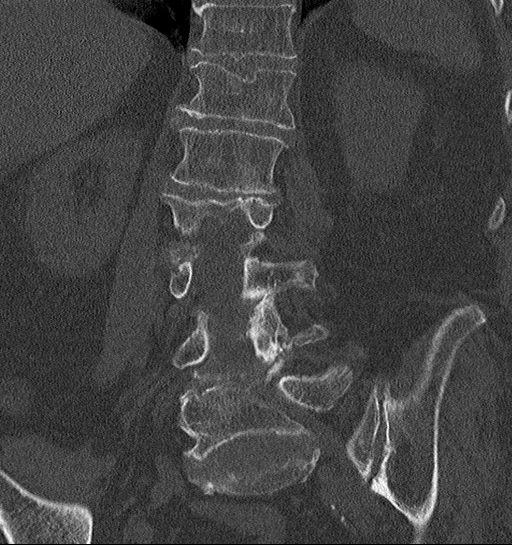
[im 61/81  bone]
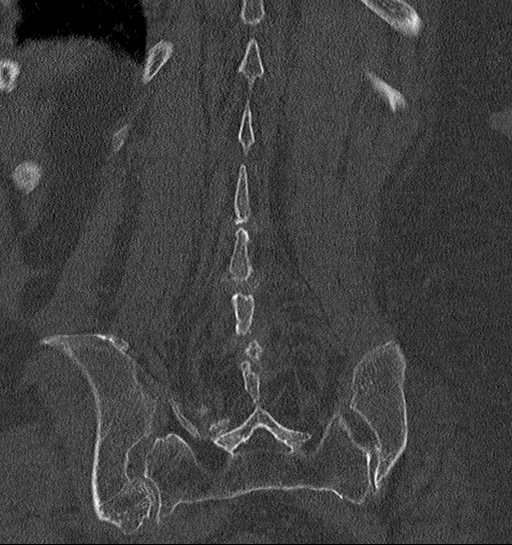

[13 of 33 positions shown; findings below may reference images not displayed]

FINDINGS: Segmentation: Normal, hypoplastic ribs at T12.

Alignment: Chronic grade 1 anterolisthesis at L4-L5 appears stable
since 0494. Dextroconvex lumbar scoliosis is moderate.

Vertebrae: Osteopenia. The visible lower thoracic levels and lower
ribs appear intact (chronic appearing posterior left 12th rib
fracture). A moderate chronic L2 compression fracture is stable
since 0494. There is a chronic fracture of the left L3 transverse
process on series 3, image 60. The remaining lumbar levels appear
intact.

There were bilateral sacral insufficiency fractures on the 0494
comparison. Today the visible sacrum and SI joints appear intact.

Paraspinal and other soft tissues: Vascular patency is not evaluated
in the absence of IV contrast. Aortoiliac calcified atherosclerosis.
Small gastric hiatal hernia. Surgically absent gallbladder. Negative
other visible noncontrast abdominal viscera. Paraspinal soft tissues
are within normal limits.

Disc levels: Chronic lumbar spine degeneration, most notable for

L3-L4 moderate multifactorial spinal stenosis related to bulky disc
bulge and posterior element hypertrophy. Vacuum disc. Chronic left
foraminal stenosis. This level is probably stable since 0494.

L4-L5: Chronic anterolisthesis with disc and severe posterior
element degeneration. Very severe chronic spinal and lateral recess
stenosis. Moderate to severe chronic L4 foraminal stenosis.

L5-S1: Severe chronic facet degeneration.
IMPRESSION: 1. No acute osseous abnormality identified in the lumbar spine.
Osteopenia. Chronic L2 compression fracture and chronic fracture of
the left L3 transverse processes.
2. Very severe chronic lower lumbar spine degeneration and spinal
stenosis, maximal at L4-L5.
3. Aortic Atherosclerosis ([id number]).

## 2019-12-21 IMAGING — CR DG CHEST 1V
1 series · 1 of 1 positions shown · non-contrast
Comparison: 10/27/2018 chest radiograph.

CLINICAL DATA: Right hip fracture

EXAM:
CHEST  1 VIEW

[dg chest 1 view]
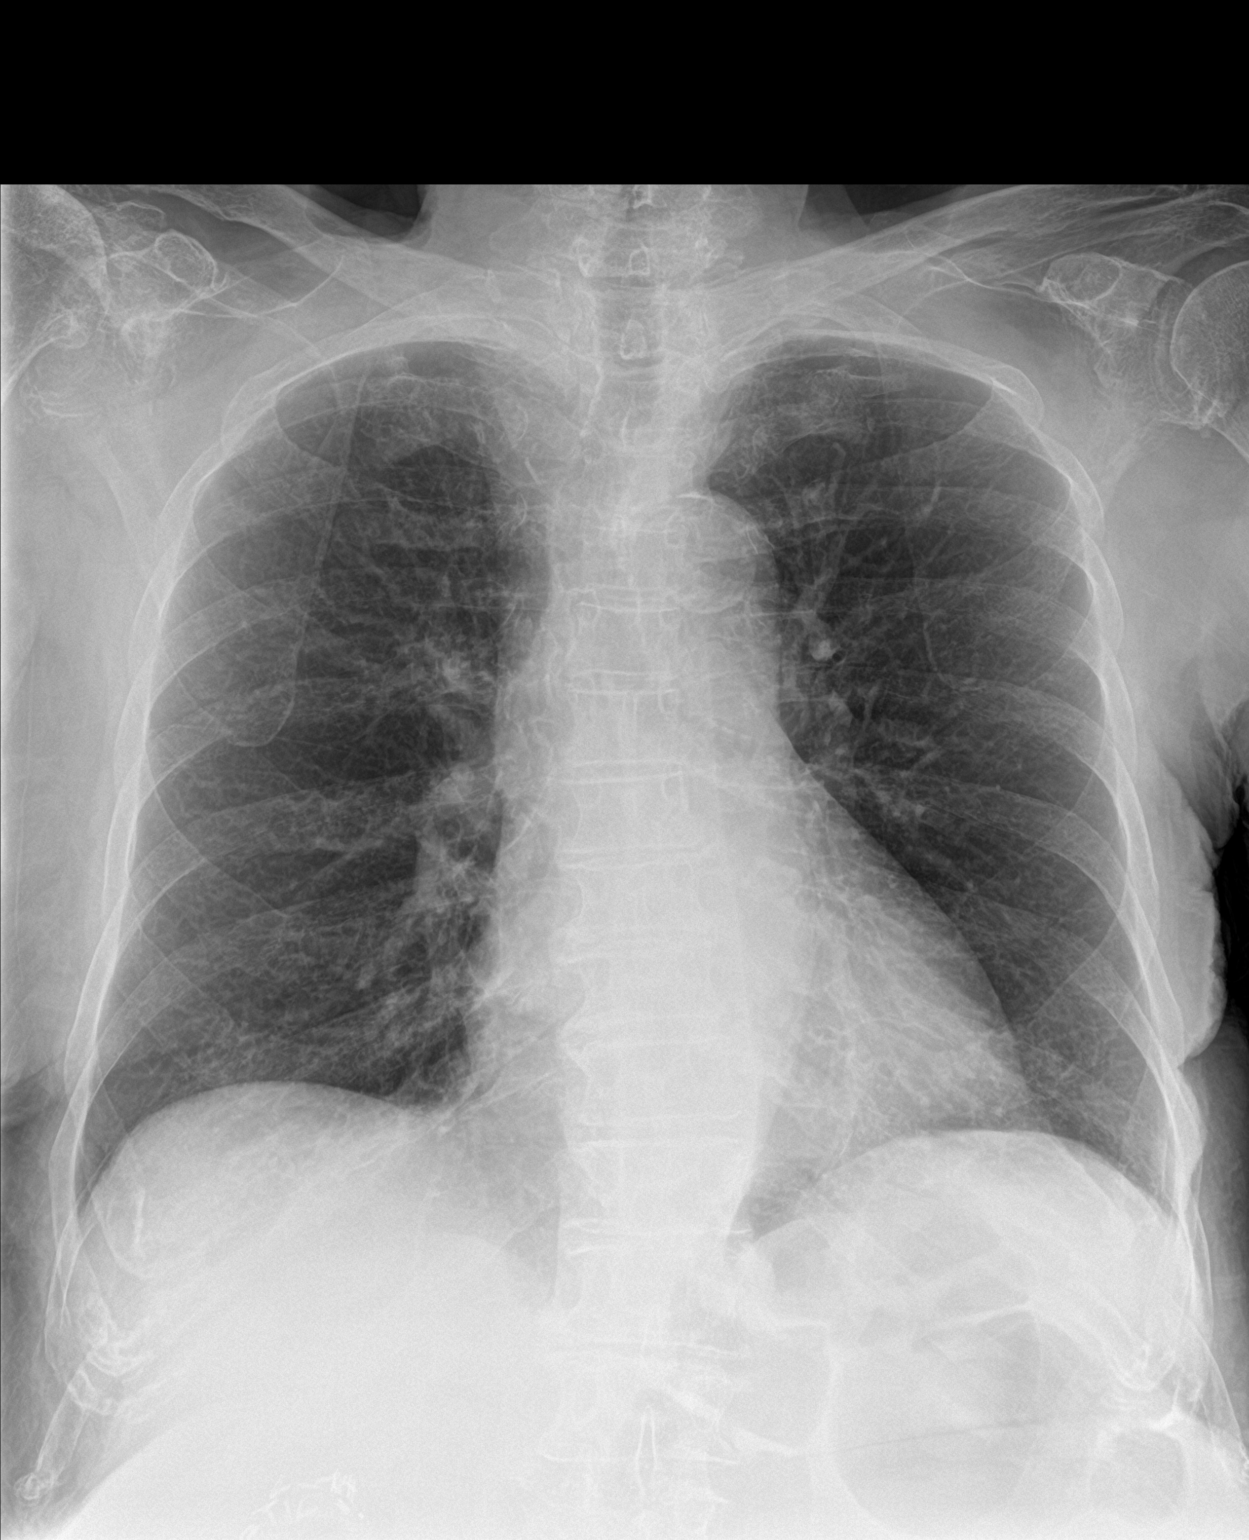

[1 of 1 positions shown; findings below may reference images not displayed]

FINDINGS: Stable cardiomediastinal silhouette with normal heart size. No
pneumothorax. No pleural effusion. Lungs appear clear, with no acute
consolidative airspace disease and no pulmonary edema.
IMPRESSION: No active disease.
